# Patient Record
Sex: Female | Born: 1935 | Race: White | Hispanic: No | Marital: Married | Smoking: Former smoker
Health system: Southern US, Community
[De-identification: ages and names within clinical notes are randomized; demographics above are authoritative.]

## PROBLEM LIST (undated history)

## (undated) DIAGNOSIS — I4891 Unspecified atrial fibrillation: Secondary | ICD-10-CM

## (undated) DIAGNOSIS — Z8701 Personal history of pneumonia (recurrent): Secondary | ICD-10-CM

## (undated) DIAGNOSIS — C4491 Basal cell carcinoma of skin, unspecified: Secondary | ICD-10-CM

## (undated) DIAGNOSIS — Z8709 Personal history of other diseases of the respiratory system: Secondary | ICD-10-CM

## (undated) DIAGNOSIS — I491 Atrial premature depolarization: Secondary | ICD-10-CM

## (undated) DIAGNOSIS — I5032 Chronic diastolic (congestive) heart failure: Secondary | ICD-10-CM

## (undated) DIAGNOSIS — I10 Essential (primary) hypertension: Secondary | ICD-10-CM

## (undated) DIAGNOSIS — G629 Polyneuropathy, unspecified: Secondary | ICD-10-CM

## (undated) DIAGNOSIS — E119 Type 2 diabetes mellitus without complications: Secondary | ICD-10-CM

## (undated) DIAGNOSIS — G8929 Other chronic pain: Secondary | ICD-10-CM

## (undated) DIAGNOSIS — I350 Nonrheumatic aortic (valve) stenosis: Secondary | ICD-10-CM

## (undated) DIAGNOSIS — M549 Dorsalgia, unspecified: Secondary | ICD-10-CM

## (undated) DIAGNOSIS — R011 Cardiac murmur, unspecified: Secondary | ICD-10-CM

## (undated) DIAGNOSIS — K219 Gastro-esophageal reflux disease without esophagitis: Secondary | ICD-10-CM

## (undated) DIAGNOSIS — R0789 Other chest pain: Secondary | ICD-10-CM

## (undated) HISTORY — DX: Basal cell carcinoma of skin, unspecified: C44.91

## (undated) HISTORY — PX: KNEE SURGERY: SHX244

## (undated) HISTORY — DX: Nonrheumatic aortic (valve) stenosis: I35.0

## (undated) HISTORY — DX: Dorsalgia, unspecified: M54.9

## (undated) HISTORY — DX: Type 2 diabetes mellitus without complications: E11.9

## (undated) HISTORY — DX: Gastro-esophageal reflux disease without esophagitis: K21.9

## (undated) HISTORY — PX: EYE SURGERY: SHX253

## (undated) HISTORY — PX: CATARACT EXTRACTION: SUR2

## (undated) HISTORY — PX: CARDIAC CATHETERIZATION: SHX172

## (undated) HISTORY — DX: Polyneuropathy, unspecified: G62.9

## (undated) HISTORY — DX: Cardiac murmur, unspecified: R01.1

## (undated) HISTORY — DX: Atrial premature depolarization: I49.1

## (undated) HISTORY — DX: Personal history of pneumonia (recurrent): Z87.01

## (undated) HISTORY — DX: Personal history of other diseases of the respiratory system: Z87.09

## (undated) HISTORY — PX: OTHER SURGICAL HISTORY: SHX169

## (undated) HISTORY — DX: Other chronic pain: G89.29

---

## 1962-01-13 HISTORY — PX: VESICOVAGINAL FISTULA CLOSURE W/ TAH: SUR271

## 1962-01-13 HISTORY — PX: ABDOMINAL HYSTERECTOMY: SHX81

## 1997-03-15 ENCOUNTER — Ambulatory Visit (HOSPITAL_COMMUNITY): Admission: RE | Admit: 1997-03-15 | Discharge: 1997-03-15 | Payer: Self-pay | Admitting: Family Medicine

## 1997-06-16 ENCOUNTER — Ambulatory Visit (HOSPITAL_COMMUNITY): Admission: RE | Admit: 1997-06-16 | Discharge: 1997-06-16 | Payer: Self-pay | Admitting: Family Medicine

## 1997-08-02 ENCOUNTER — Ambulatory Visit (HOSPITAL_COMMUNITY): Admission: RE | Admit: 1997-08-02 | Discharge: 1997-08-02 | Payer: Self-pay | Admitting: Family Medicine

## 1998-01-13 HISTORY — PX: JOINT REPLACEMENT: SHX530

## 1998-03-29 ENCOUNTER — Ambulatory Visit (HOSPITAL_COMMUNITY): Admission: RE | Admit: 1998-03-29 | Discharge: 1998-03-29 | Payer: Self-pay | Admitting: Family Medicine

## 1998-04-04 ENCOUNTER — Encounter: Payer: Self-pay | Admitting: Family Medicine

## 1998-04-04 ENCOUNTER — Ambulatory Visit (HOSPITAL_COMMUNITY): Admission: RE | Admit: 1998-04-04 | Discharge: 1998-04-04 | Payer: Self-pay | Admitting: Family Medicine

## 1998-07-25 ENCOUNTER — Encounter: Payer: Self-pay | Admitting: Specialist

## 1998-07-31 ENCOUNTER — Inpatient Hospital Stay (HOSPITAL_COMMUNITY): Admission: RE | Admit: 1998-07-31 | Discharge: 1998-08-06 | Payer: Self-pay | Admitting: Specialist

## 1998-07-31 ENCOUNTER — Encounter: Payer: Self-pay | Admitting: Specialist

## 1998-08-02 ENCOUNTER — Encounter: Payer: Self-pay | Admitting: Specialist

## 1998-08-23 ENCOUNTER — Encounter: Admission: RE | Admit: 1998-08-23 | Discharge: 1998-11-21 | Payer: Self-pay | Admitting: Specialist

## 1999-04-10 ENCOUNTER — Ambulatory Visit (HOSPITAL_COMMUNITY): Admission: RE | Admit: 1999-04-10 | Discharge: 1999-04-10 | Payer: Self-pay | Admitting: Family Medicine

## 1999-04-10 ENCOUNTER — Encounter: Payer: Self-pay | Admitting: Family Medicine

## 2000-02-19 ENCOUNTER — Other Ambulatory Visit: Admission: RE | Admit: 2000-02-19 | Discharge: 2000-02-19 | Payer: Self-pay | Admitting: Family Medicine

## 2000-04-15 ENCOUNTER — Encounter: Payer: Self-pay | Admitting: Family Medicine

## 2000-04-15 ENCOUNTER — Ambulatory Visit (HOSPITAL_COMMUNITY): Admission: RE | Admit: 2000-04-15 | Discharge: 2000-04-15 | Payer: Self-pay | Admitting: Family Medicine

## 2000-10-08 ENCOUNTER — Encounter: Payer: Self-pay | Admitting: Family Medicine

## 2000-10-08 ENCOUNTER — Encounter: Admission: RE | Admit: 2000-10-08 | Discharge: 2000-10-08 | Payer: Self-pay | Admitting: Family Medicine

## 2001-04-21 ENCOUNTER — Encounter: Payer: Self-pay | Admitting: Family Medicine

## 2001-04-21 ENCOUNTER — Ambulatory Visit (HOSPITAL_COMMUNITY): Admission: RE | Admit: 2001-04-21 | Discharge: 2001-04-21 | Payer: Self-pay | Admitting: Family Medicine

## 2002-04-25 ENCOUNTER — Ambulatory Visit (HOSPITAL_COMMUNITY): Admission: RE | Admit: 2002-04-25 | Discharge: 2002-04-25 | Payer: Self-pay | Admitting: Family Medicine

## 2002-04-25 ENCOUNTER — Encounter: Payer: Self-pay | Admitting: Family Medicine

## 2002-09-21 ENCOUNTER — Ambulatory Visit (HOSPITAL_COMMUNITY): Admission: RE | Admit: 2002-09-21 | Discharge: 2002-09-21 | Payer: Self-pay | Admitting: Gastroenterology

## 2002-11-25 ENCOUNTER — Encounter: Admission: RE | Admit: 2002-11-25 | Discharge: 2002-12-02 | Payer: Self-pay | Admitting: Family Medicine

## 2003-05-11 ENCOUNTER — Ambulatory Visit (HOSPITAL_COMMUNITY): Admission: RE | Admit: 2003-05-11 | Discharge: 2003-05-11 | Payer: Self-pay | Admitting: Family Medicine

## 2004-05-23 ENCOUNTER — Ambulatory Visit (HOSPITAL_COMMUNITY): Admission: RE | Admit: 2004-05-23 | Discharge: 2004-05-23 | Payer: Self-pay | Admitting: Family Medicine

## 2005-05-13 HISTORY — PX: CARDIOVASCULAR STRESS TEST: SHX262

## 2005-05-28 ENCOUNTER — Ambulatory Visit (HOSPITAL_COMMUNITY): Admission: RE | Admit: 2005-05-28 | Discharge: 2005-05-28 | Payer: Self-pay | Admitting: Family Medicine

## 2006-05-28 ENCOUNTER — Ambulatory Visit: Payer: Self-pay | Admitting: Family Medicine

## 2006-06-03 ENCOUNTER — Ambulatory Visit (HOSPITAL_COMMUNITY): Admission: RE | Admit: 2006-06-03 | Discharge: 2006-06-03 | Payer: Self-pay | Admitting: Family Medicine

## 2007-05-18 ENCOUNTER — Ambulatory Visit (HOSPITAL_COMMUNITY): Admission: RE | Admit: 2007-05-18 | Discharge: 2007-05-18 | Payer: Self-pay | Admitting: Gastroenterology

## 2007-06-09 ENCOUNTER — Encounter: Admission: RE | Admit: 2007-06-09 | Discharge: 2007-06-09 | Payer: Self-pay | Admitting: Family Medicine

## 2008-06-15 ENCOUNTER — Ambulatory Visit (HOSPITAL_COMMUNITY): Admission: RE | Admit: 2008-06-15 | Discharge: 2008-06-15 | Payer: Self-pay | Admitting: Family Medicine

## 2009-04-26 HISTORY — PX: US ECHOCARDIOGRAPHY: HXRAD669

## 2009-06-18 ENCOUNTER — Ambulatory Visit (HOSPITAL_COMMUNITY): Admission: RE | Admit: 2009-06-18 | Discharge: 2009-06-18 | Payer: Self-pay | Admitting: Family Medicine

## 2009-10-23 ENCOUNTER — Ambulatory Visit: Payer: Self-pay | Admitting: Cardiology

## 2010-02-03 ENCOUNTER — Encounter: Payer: Self-pay | Admitting: Family Medicine

## 2010-05-31 NOTE — Op Note (Signed)
   NAME:  [name] [name], [name] [name]                          ACCOUNT NO.:  [id number]   MEDICAL RECORD NO.:  [id number]                   PATIENT TYPE:  AMB   LOCATION:  ENDO                                 FACILITY:  [facility]   PHYSICIAN:  Petra Kuba, M.D.                 DATE OF BIRTH:  [birth year]   DATE OF PROCEDURE:  09/21/2002  DATE OF DISCHARGE:                                 OPERATIVE REPORT   PROCEDURE:  Colonoscopy.   INDICATIONS FOR PROCEDURE:  Change in bowel habits in patient well overdue  for colonic screening.   Consent was signed after risks, benefits, methods, and options were  thoroughly discussed in the office.   MEDICINES USED:  Fentanyl 87.5, Versed 7 mg.   DESCRIPTION OF PROCEDURE:  Rectal inspection was pertinent for external  hemorrhoids, small. Digital exam was negative. The pediatric  adjustable  colonoscope was inserted and advanced around the colon to the cecum. This  did require rolling her on her back and various abdominal pressures. On  insertion, some left sided diverticula were seen but no other abnormalities.  The cecum was identified by the appendiceal orifice and the ileocecal valve.  The scope was slowly withdrawn. The prep was fairly adequate, did require  lots of washing and suctioning for adequate visualization but on slow  withdrawal through the colon other than the left sided scattered  diverticula, no polyps, tumors, masses or signs of colitis were seen as we  slowly withdrew back to the rectum. Once back in the rectum, anorectal pull-  through and retroflexion confirmed the hemorrhoids. The scope was reinserted  a short ways up the left side of the colon, air was suctioned, scope  removed. The patient tolerated the procedure well. There was no obvious or  immediate complications.   ENDOSCOPIC DIAGNOSIS:  1. Internal and external hemorrhoids.  2. Left sided diverticula scattered.  3. Otherwise within normal limits to the cecum.   PLAN:   Consider repeat screening in five years if doing well medically.  Happy to see back p.r.n. or in two months to recheck symptoms and decide any  other workup and plans. Possibly just increasing fiber in her diet, possibly  the reassurance and a good clean out will allow her to return to her  previous normal bowel habits.                                               Petra Kuba, M.D.    MEM/MEDQ  D:  09/21/2002  T:  09/21/2002  Job:  [id number]   cc:   Pam Drown, M.D.  [address]  [place]  [place] [address]  Fax: [phone]

## 2010-07-12 ENCOUNTER — Other Ambulatory Visit (HOSPITAL_COMMUNITY): Payer: Self-pay | Admitting: Family Medicine

## 2010-07-12 DIAGNOSIS — Z1231 Encounter for screening mammogram for malignant neoplasm of breast: Secondary | ICD-10-CM

## 2010-07-26 ENCOUNTER — Ambulatory Visit (HOSPITAL_COMMUNITY): Payer: Self-pay

## 2010-07-29 ENCOUNTER — Telehealth: Payer: Self-pay | Admitting: Cardiology

## 2010-07-29 DIAGNOSIS — I350 Nonrheumatic aortic (valve) stenosis: Secondary | ICD-10-CM

## 2010-07-29 NOTE — Telephone Encounter (Signed)
Just wanted to remind you of the ECHO she is supposed to have done before scheduling to see Dr. Patty Sermons in October. Outside of calling to let her know what time she would be scheduled to have it done there is no need to call back. I have pulled her chart.

## 2010-08-01 ENCOUNTER — Ambulatory Visit (HOSPITAL_COMMUNITY)
Admission: RE | Admit: 2010-08-01 | Discharge: 2010-08-01 | Disposition: A | Discharge status: Home / Self Care | Payer: Medicare Other | Source: Ambulatory Visit | Attending: Family Medicine | Admitting: Family Medicine

## 2010-08-01 DIAGNOSIS — Z1231 Encounter for screening mammogram for malignant neoplasm of breast: Secondary | ICD-10-CM | POA: Insufficient documentation

## 2010-08-06 NOTE — Telephone Encounter (Signed)
Scheduling echo and follow up with  Dr. Patty Sermons

## 2010-08-22 ENCOUNTER — Ambulatory Visit
Admission: RE | Admit: 2010-08-22 | Discharge: 2010-08-22 | Disposition: A | Discharge status: Home / Self Care | Payer: No Typology Code available for payment source | Source: Ambulatory Visit | Attending: Family Medicine | Admitting: Family Medicine

## 2010-08-22 ENCOUNTER — Other Ambulatory Visit: Payer: Self-pay | Admitting: Family Medicine

## 2010-08-22 DIAGNOSIS — R1031 Right lower quadrant pain: Secondary | ICD-10-CM

## 2010-08-22 MED ORDER — IOHEXOL 300 MG/ML  SOLN: 125.0000 mL | Freq: Once | INTRAMUSCULAR | Status: AC | PRN

## 2010-10-14 ENCOUNTER — Other Ambulatory Visit (HOSPITAL_COMMUNITY): Payer: No Typology Code available for payment source | Admitting: Radiology

## 2010-10-18 ENCOUNTER — Ambulatory Visit (HOSPITAL_COMMUNITY): Payer: Medicare Other | Attending: Cardiology | Admitting: Radiology

## 2010-10-18 ENCOUNTER — Other Ambulatory Visit (HOSPITAL_COMMUNITY): Payer: No Typology Code available for payment source

## 2010-10-18 DIAGNOSIS — I2510 Atherosclerotic heart disease of native coronary artery without angina pectoris: Secondary | ICD-10-CM | POA: Insufficient documentation

## 2010-10-18 DIAGNOSIS — I359 Nonrheumatic aortic valve disorder, unspecified: Secondary | ICD-10-CM | POA: Insufficient documentation

## 2010-10-18 DIAGNOSIS — I350 Nonrheumatic aortic (valve) stenosis: Secondary | ICD-10-CM

## 2010-10-23 ENCOUNTER — Ambulatory Visit: Payer: No Typology Code available for payment source | Admitting: Cardiology

## 2010-10-24 ENCOUNTER — Encounter: Payer: Self-pay | Admitting: Cardiology

## 2010-11-12 ENCOUNTER — Ambulatory Visit (INDEPENDENT_AMBULATORY_CARE_PROVIDER_SITE_OTHER): Payer: Medicare Other | Admitting: Cardiology

## 2010-11-12 ENCOUNTER — Encounter: Payer: Self-pay | Admitting: Cardiology

## 2010-11-12 VITALS — BP 118/62 | HR 80 | Ht 65.0 in | Wt 207.0 lb

## 2010-11-12 DIAGNOSIS — I359 Nonrheumatic aortic valve disorder, unspecified: Secondary | ICD-10-CM

## 2010-11-12 DIAGNOSIS — I350 Nonrheumatic aortic (valve) stenosis: Secondary | ICD-10-CM | POA: Insufficient documentation

## 2010-11-12 DIAGNOSIS — E119 Type 2 diabetes mellitus without complications: Secondary | ICD-10-CM

## 2010-11-12 DIAGNOSIS — I119 Hypertensive heart disease without heart failure: Secondary | ICD-10-CM

## 2010-11-12 DIAGNOSIS — K264 Chronic or unspecified duodenal ulcer with hemorrhage: Secondary | ICD-10-CM

## 2010-11-12 DIAGNOSIS — E7800 Pure hypercholesterolemia, unspecified: Secondary | ICD-10-CM

## 2010-11-12 DIAGNOSIS — R5381 Other malaise: Secondary | ICD-10-CM

## 2010-11-12 NOTE — Assessment & Plan Note (Signed)
The patient had a history of recent duodenal bleed in August.  Her hemoglobin reached a low of 9.1.  She did not require transfusion.  She recently saw Dr. Ewing Schlein  , who placed her on over-the-counter iron once a day.  The patient has not been experiencing any significant evidence of recurrent hemorrhage.  We will now restart her baby aspirin just 3 days a week 81 mg Monday, Wednesday, and Friday, and she will take it with food

## 2010-11-12 NOTE — Progress Notes (Signed)
[name] [name] Date of Birth:  10-Oct-1930 [facility] [address]   [address] [place], [place]  [address] [phone]           Fax   [phone]  History of Present Illness: This 77 year old female is seen for a one-year followup office visit.  She has a history of mild aortic stenosis.  She also has a history of diabetes, hypertension, and hypercholesterolemia.  She has had exogenous obesity.  She has been successful and gradually losing weight.  Her last saw her.  She was hospitalized in August at [facility] with a bleeding ulcer.  It was a duodenal ulcer.  At the present time.  She is off her aspirin.  Current Outpatient Prescriptions  Medication Sig Dispense Refill  . aspirin 81 MG tablet Take 81 mg by mouth as directed. Take 1 on Monday, Wednesday, and Friday only       . Calcium Carbonate-Vitamin D (CALCIUM + D PO) Take by mouth daily.        Marland Kitchen CRANBERRY FRUIT PO Take by mouth.        . fentaNYL (DURAGESIC - DOSED MCG/HR) 50 MCG/HR Place 1 patch onto the skin every 3 (three) days.        . furosemide (LASIX) 20 MG tablet Take 20 mg by mouth as needed.        . gabapentin (NEURONTIN) 300 MG capsule Take 300 mg by mouth 3 (three) times daily.        Marland Kitchen HYDROcodone-acetaminophen (VICODIN) 5-500 MG per tablet Take 1 tablet by mouth every 6 (six) hours as needed.        . IRON PO Take by mouth.        . metFORMIN (GLUCOPHAGE) 500 MG tablet Take 500 mg by mouth daily with breakfast. 3 daily      . metoprolol (TOPROL-XL) 50 MG 24 hr tablet Take 50 mg by mouth daily. Taking 1/2 Daily       . Multiple Vitamin (MULTIVITAMIN PO) Take by mouth daily.        . pantoprazole (PROTONIX) 40 MG tablet Take 40 mg by mouth 2 (two) times daily.        . Rosuvastatin Calcium (CRESTOR PO) Take 5 mg by mouth.          Allergies  Allergen Reactions  . Codeine   . Demerol   . Statins   . Zetia (Ezetimibe)     There is no problem list on file for this  patient.   History  Smoking status  . Former Smoker  . Quit date: 01/14/1980  Smokeless tobacco  . Not on file    History  Alcohol Use No    No family history on file.  Review of Systems: Constitutional: no fever chills diaphoresis or fatigue or change in weight.  Head and neck: no hearing loss, no epistaxis, no photophobia or visual disturbance. Respiratory: No cough, shortness of breath or wheezing. Cardiovascular: No chest pain peripheral edema, palpitations. Gastrointestinal: No abdominal distention, no abdominal pain, no change in bowel habits hematochezia or melena. Genitourinary: No dysuria, no frequency, no urgency, no nocturia. Musculoskeletal:No arthralgias, no back pain, no gait disturbance or myalgias. Neurological: No dizziness, no headaches, no numbness, no seizures, no syncope, no weakness, no tremors. Hematologic: No lymphadenopathy, no easy bruising. Psychiatric: No confusion, no hallucinations, no sleep disturbance.    Physical Exam: Filed Vitals:   11/12/10 1143  BP: 118/62  Pulse: 80  Gen. appearance reveals a well-developed, well-nourished woman in no distress.  Her pulse when I checked her after she got up on the examination table was 100.Pupils equal and reactive.   Extraocular Movements are full.  There is no scleral icterus.  The mouth and pharynx are normal.  The neck is supple.  The carotids reveal no bruits.  The jugular venous pressure is normal.  The thyroid is not enlarged.  There is no lymphadenopathy.  The chest is clear to percussion and auscultation. There are no rales or rhonchi. Expansion of the chest is symmetrical.  Heart reveals a soft systolic ejection murmur at the baseThe abdomen is soft and nontender. Bowel sounds are normal. The liver and spleen are not enlarged. There Are no abdominal masses. There are no bruits.  The pedal pulses are good.  There is no phlebitis or edema.  There is no cyanosis or clubbing. Strength is normal  and symmetrical in all extremities.  There is no lateralizing weakness.  There are no sensory deficits.  The skin is warm and dry.  There is no rash.  EKG shows sinus tachycardia at 100 per minute, and no ischemic changes  Assessment / Plan: Continue same medications.  Add baby aspirin Monday, Wednesday, and Friday.  Continue proton pump inhibitor.  Recheck here in one year for followup office visit and EKG.  After that we will decide about timing of her next echo.

## 2010-11-12 NOTE — Assessment & Plan Note (Signed)
Patient has a history of a systolic ejection murmur.  Her recent echocardiogram showed very mild aortic stenosis with a mean systolic gradient of 11 and a peak gradient of 22.  She also had mild LVH with normal systolic function.  There was no comment on diastolic function.  Clinically, the patient has been more fatigued and has been more short of breath related to her recent anemia, secondary to bleeding, duodenal ulcer

## 2010-11-12 NOTE — Assessment & Plan Note (Signed)
The patient has a history of essential hypertension.  She has not been experiencing any dizziness or syncope.  She has had some exertional dyspnea and fatigue related to her anemia.

## 2010-11-12 NOTE — Patient Instructions (Signed)
Restart Aspirin 81 mg Monday, Wednesday, and Friday only  Your physician wants you to follow-up in: 1 year You will receive a reminder letter in the mail two months in advance. If you don't receive a letter, please call our office to schedule the follow-up appointment.

## 2010-11-12 NOTE — Assessment & Plan Note (Signed)
The patient has a past history of cholesterolemia, followed by Dr. Uvaldo Rising

## 2011-06-16 ENCOUNTER — Other Ambulatory Visit (HOSPITAL_COMMUNITY): Payer: Self-pay | Admitting: Family Medicine

## 2011-06-16 DIAGNOSIS — Z1231 Encounter for screening mammogram for malignant neoplasm of breast: Secondary | ICD-10-CM

## 2011-08-04 ENCOUNTER — Ambulatory Visit (HOSPITAL_COMMUNITY)
Admission: RE | Admit: 2011-08-04 | Discharge: 2011-08-04 | Disposition: A | Discharge status: Home / Self Care | Payer: Medicare Other | Source: Ambulatory Visit | Attending: Family Medicine | Admitting: Family Medicine

## 2011-08-04 DIAGNOSIS — Z1231 Encounter for screening mammogram for malignant neoplasm of breast: Secondary | ICD-10-CM

## 2011-10-02 ENCOUNTER — Ambulatory Visit
Admission: RE | Admit: 2011-10-02 | Discharge: 2011-10-02 | Disposition: A | Discharge status: Home / Self Care | Payer: Medicare Other | Source: Ambulatory Visit | Attending: Physician Assistant | Admitting: Physician Assistant

## 2011-10-02 ENCOUNTER — Other Ambulatory Visit: Payer: Self-pay | Admitting: Physician Assistant

## 2011-10-02 DIAGNOSIS — R52 Pain, unspecified: Secondary | ICD-10-CM

## 2011-12-18 ENCOUNTER — Encounter (INDEPENDENT_AMBULATORY_CARE_PROVIDER_SITE_OTHER): Payer: Self-pay | Admitting: General Surgery

## 2011-12-19 ENCOUNTER — Ambulatory Visit (INDEPENDENT_AMBULATORY_CARE_PROVIDER_SITE_OTHER): Payer: Medicare Other | Admitting: General Surgery

## 2011-12-19 ENCOUNTER — Encounter (INDEPENDENT_AMBULATORY_CARE_PROVIDER_SITE_OTHER): Payer: Self-pay | Admitting: General Surgery

## 2011-12-19 VITALS — BP 122/79 | HR 83 | Temp 97.2°F | Resp 16 | Ht 63.0 in | Wt 201.8 lb

## 2011-12-19 DIAGNOSIS — D179 Benign lipomatous neoplasm, unspecified: Secondary | ICD-10-CM

## 2011-12-19 NOTE — Progress Notes (Signed)
Patient ID: [name] [name], female   DOB: 11/16/1930, 78 y.o.   MRN: [id number]  Chief Complaint  Patient presents with  . Lipoma    HPI [name] [name] is a 78 y.o. female.  For by Dr. McNeill for evaluation of a right back mass. He states that for several months. She states that this pain it has been getting bigger over this period of time. HPI  Past Medical History  Diagnosis Date  . Exertional dyspnea   . Heart murmur     She has a known heart murmur of aortic valve stenosis  . Chronic back pain     She does have a hx of   . Aortic stenosis     She has a past hx of known  . Diabetes mellitus     adult onset   . Peripheral neuropathy   . GERD (gastroesophageal reflux disease)     Past Surgical History  Procedure Date  . Vesicovaginal fistula closure w/ tah 1964  . Us echocardiography 04-26-09    EF 55-60%  . Cardiovascular stress test 05-13-2005    EF 67%  . Knee surgery     History reviewed. No pertinent family history.  Social History History  Substance Use Topics  . Smoking status: Former Smoker    Quit date: 01/14/1980  . Smokeless tobacco: Not on file  . Alcohol Use: No    Allergies  Allergen Reactions  . Codeine   . Demerol   . Statins   . Zetia (Ezetimibe)     Current Outpatient Prescriptions  Medication Sig Dispense Refill  . aspirin 81 MG tablet Take 81 mg by mouth as directed. Take 1 on Monday, Wednesday, and Friday only       . Biotin (BIOTIN 5000) 5 MG CAPS Take by mouth.      . Blood Glucose Monitoring Suppl (ONE TOUCH ULTRA SYSTEM KIT) W/DEVICE KIT 1 kit by Does not apply route once.      . Calcium Carbonate-Vitamin D (CALCIUM + D PO) Take by mouth daily.        . CRANBERRY FRUIT PO Take by mouth.        . fentaNYL (DURAGESIC - DOSED MCG/HR) 50 MCG/HR Place 1 patch onto the skin every 3 (three) days.        . furosemide (LASIX) 20 MG tablet Take 20 mg by mouth as needed.        . gabapentin (NEURONTIN) 300 MG capsule Take 300 mg by mouth 3  (three) times daily.        . HYDROcodone-acetaminophen (VICODIN) 5-500 MG per tablet Take 1 tablet by mouth every 6 (six) hours as needed.        . IRON PO Take by mouth.        . Lancets MISC by Does not apply route.      . lisinopril-hydrochlorothiazide (PRINZIDE,ZESTORETIC) 20-25 MG per tablet Take 1 tablet by mouth daily.      . metFORMIN (GLUCOPHAGE) 500 MG tablet Take 500 mg by mouth daily with breakfast. 3 daily      . metoprolol (TOPROL-XL) 50 MG 24 hr tablet Take 50 mg by mouth daily. Taking 1/2 Daily       . Multiple Vitamin (MULTIVITAMIN PO) Take by mouth daily.        . pantoprazole (PROTONIX) 40 MG tablet Take 40 mg by mouth 2 (two) times daily.        . Rosuvastatin Calcium (CRESTOR PO) Take 5 mg   by mouth.          Review of Systems Review of Systems  Blood pressure 122/79, pulse 83, temperature 97.2 F (36.2 C), temperature source Temporal, resp. rate 16, height 5' 3" (1.6 m), weight 201 lb 12.8 oz (91.536 kg).  Physical Exam Physical Exam  Data Reviewed none  Assessment    78-year-old female with right back mass.    Plan    1. We'll proceed to the operating room for excision of a right back mass. 2. All risks and benefits were discussed with the patient, to generally include infection, bleeding, damage to surrounding structures, and recurrence. Alternatives were offered and described.  All questions were answered and the patient voiced understanding of the procedure and wishes to proceed at this point.        Shaka Zech Jr., [name] [name] 12/19/2011, 1:57 PM    

## 2011-12-19 NOTE — Addendum Note (Signed)
Addended by: Axel Filler on: 12/19/2011 02:20 PM   Modules accepted: Orders

## 2012-01-05 ENCOUNTER — Encounter (HOSPITAL_COMMUNITY): Payer: Self-pay

## 2012-01-08 ENCOUNTER — Encounter (HOSPITAL_COMMUNITY): Payer: Self-pay

## 2012-01-08 ENCOUNTER — Ambulatory Visit (HOSPITAL_COMMUNITY)
Admission: RE | Admit: 2012-01-08 | Discharge: 2012-01-08 | Disposition: A | Discharge status: Home / Self Care | Payer: Medicare Other | Source: Ambulatory Visit | Attending: General Surgery | Admitting: General Surgery

## 2012-01-08 ENCOUNTER — Encounter (HOSPITAL_COMMUNITY)
Admission: RE | Admit: 2012-01-08 | Discharge: 2012-01-08 | Disposition: A | Discharge status: Home / Self Care | Payer: Medicare Other | Source: Ambulatory Visit | Attending: General Surgery | Admitting: General Surgery

## 2012-01-08 DIAGNOSIS — Z01818 Encounter for other preprocedural examination: Secondary | ICD-10-CM | POA: Insufficient documentation

## 2012-01-08 LAB — BASIC METABOLIC PANEL
BUN: 19 mg/dL (ref 6–23)
Chloride: 97 mEq/L (ref 96–112)
GFR calc Af Amer: 71 mL/min — ABNORMAL LOW (ref >90)
Potassium: 4.5 mEq/L (ref 3.5–5.1)

## 2012-01-08 LAB — CBC
HCT: 37.7 % (ref 36.0–46.0)
Platelets: 227 10*3/uL (ref 150–400)
RDW: 12.2 % (ref 11.5–15.5)
WBC: 9.4 10*3/uL (ref 4.0–10.5)

## 2012-01-08 LAB — SURGICAL PCR SCREEN: Staphylococcus aureus: NEGATIVE

## 2012-01-08 NOTE — Patient Instructions (Addendum)
20 [name] [name]  01/08/2012   Your procedure is scheduled on: 01/13/12  Report to Darrin Nipper at [phone].  Call this number if you have problems the morning of surgery 336-: [phone]   Remember:   Do not eat food or drink liquids After Midnight.     Take these medicines the morning of surgery with A SIP OF WATER: gabapentin, protonix, vicoden if needed   Do not wear jewelry, make-up or nail polish.  Do not wear lotions, powders, or perfumes. You may wear deodorant.  Do not shave 48 hours prior to surgery. Men may shave face and neck.  Do not bring valuables to the hospital.  Contacts, dentures or bridgework may not be worn into surgery.     Patients discharged the day of surgery will not be allowed to drive home.  Name and phone number of your driver: Bonnita Hollow [phone]    Please read over the following fact sheets that you were given: MRSA Information.  Birdie Sons, RN  pre op nurse call if needed [phone]    FAILURE TO FOLLOW THESE INSTRUCTIONS MAY RESULT IN CANCELLATION OF YOUR SURGERY   Patient Signature: ___________________________________________

## 2012-01-13 ENCOUNTER — Encounter (HOSPITAL_COMMUNITY): Payer: Self-pay | Admitting: Anesthesiology

## 2012-01-13 ENCOUNTER — Encounter (HOSPITAL_COMMUNITY)
Admission: RE | Disposition: A | Discharge status: Home / Self Care | Payer: Self-pay | Source: Ambulatory Visit | Attending: General Surgery

## 2012-01-13 ENCOUNTER — Encounter (HOSPITAL_COMMUNITY): Payer: Self-pay | Admitting: *Deleted

## 2012-01-13 ENCOUNTER — Ambulatory Visit (HOSPITAL_COMMUNITY)
Admission: RE | Admit: 2012-01-13 | Discharge: 2012-01-13 | Disposition: A | Discharge status: Home / Self Care | Payer: Medicare Other | Source: Ambulatory Visit | Attending: General Surgery | Admitting: General Surgery

## 2012-01-13 ENCOUNTER — Ambulatory Visit (HOSPITAL_COMMUNITY): Payer: Medicare Other | Admitting: Anesthesiology

## 2012-01-13 DIAGNOSIS — K219 Gastro-esophageal reflux disease without esophagitis: Secondary | ICD-10-CM | POA: Insufficient documentation

## 2012-01-13 DIAGNOSIS — Z79899 Other long term (current) drug therapy: Secondary | ICD-10-CM | POA: Insufficient documentation

## 2012-01-13 DIAGNOSIS — L723 Sebaceous cyst: Secondary | ICD-10-CM | POA: Insufficient documentation

## 2012-01-13 DIAGNOSIS — I359 Nonrheumatic aortic valve disorder, unspecified: Secondary | ICD-10-CM

## 2012-01-13 DIAGNOSIS — E119 Type 2 diabetes mellitus without complications: Secondary | ICD-10-CM | POA: Insufficient documentation

## 2012-01-13 DIAGNOSIS — Z7982 Long term (current) use of aspirin: Secondary | ICD-10-CM | POA: Insufficient documentation

## 2012-01-13 DIAGNOSIS — I350 Nonrheumatic aortic (valve) stenosis: Secondary | ICD-10-CM

## 2012-01-13 DIAGNOSIS — I499 Cardiac arrhythmia, unspecified: Secondary | ICD-10-CM

## 2012-01-13 HISTORY — PX: MASS EXCISION: SHX2000

## 2012-01-13 LAB — GLUCOSE, CAPILLARY
Glucose-Capillary: 138 mg/dL — ABNORMAL HIGH (ref 70–99)
Glucose-Capillary: 125 mg/dL — ABNORMAL HIGH (ref 70–99)

## 2012-01-13 SURGERY — EXCISION MASS
Anesthesia: General | Site: Back | Laterality: Right | Wound class: Clean

## 2012-01-13 MED ORDER — PROMETHAZINE HCL 25 MG/ML IJ SOLN: 6.2500 mg | INTRAMUSCULAR | Status: DC | PRN

## 2012-01-13 MED ORDER — 0.9 % SODIUM CHLORIDE (POUR BTL) OPTIME
TOPICAL | Status: DC | PRN
  Administered 2012-01-13: 1000 mL

## 2012-01-13 MED ORDER — ACETAMINOPHEN 10 MG/ML IV SOLN
INTRAVENOUS | Status: AC
  Filled 2012-01-13: qty 100

## 2012-01-13 MED ORDER — PROPOFOL 10 MG/ML IV BOLUS
INTRAVENOUS | Status: DC | PRN
  Administered 2012-01-13: 150 mg via INTRAVENOUS

## 2012-01-13 MED ORDER — CEFAZOLIN SODIUM-DEXTROSE 2-3 GM-% IV SOLR
2.0000 g | INTRAVENOUS | Status: AC
  Administered 2012-01-13: 2 g via INTRAVENOUS

## 2012-01-13 MED ORDER — BUPIVACAINE-EPINEPHRINE 0.25% -1:200000 IJ SOLN
INTRAMUSCULAR | Status: DC | PRN
  Administered 2012-01-13: 10 mL

## 2012-01-13 MED ORDER — BUPIVACAINE-EPINEPHRINE PF 0.25-1:200000 % IJ SOLN
INTRAMUSCULAR | Status: AC
  Filled 2012-01-13: qty 30

## 2012-01-13 MED ORDER — SUCCINYLCHOLINE CHLORIDE 20 MG/ML IJ SOLN
INTRAMUSCULAR | Status: DC | PRN
  Administered 2012-01-13: 100 mg via INTRAVENOUS

## 2012-01-13 MED ORDER — CEFAZOLIN SODIUM-DEXTROSE 2-3 GM-% IV SOLR
INTRAVENOUS | Status: AC
  Filled 2012-01-13: qty 50

## 2012-01-13 MED ORDER — METOPROLOL SUCCINATE ER 50 MG PO TB24
50.0000 mg | ORAL_TABLET | ORAL | Status: AC
  Administered 2012-01-13: 50 mg via ORAL
  Filled 2012-01-13: qty 1

## 2012-01-13 MED ORDER — FENTANYL CITRATE 0.05 MG/ML IJ SOLN: 25.0000 ug | INTRAMUSCULAR | Status: DC | PRN

## 2012-01-13 MED ORDER — LACTATED RINGERS IV SOLN: INTRAVENOUS | Status: DC

## 2012-01-13 MED ORDER — HYDROCODONE-ACETAMINOPHEN 10-500 MG PO TABS: 1.0000 | ORAL_TABLET | Freq: Four times a day (QID) | ORAL | Status: DC | PRN

## 2012-01-13 MED ORDER — LACTATED RINGERS IV SOLN
INTRAVENOUS | Status: DC
  Administered 2012-01-13: 13:00:00 via INTRAVENOUS

## 2012-01-13 MED ORDER — ACETAMINOPHEN 10 MG/ML IV SOLN
INTRAVENOUS | Status: DC | PRN
  Administered 2012-01-13: 1000 mg via INTRAVENOUS

## 2012-01-13 MED ORDER — ONDANSETRON HCL 4 MG/2ML IJ SOLN
INTRAMUSCULAR | Status: DC | PRN
  Administered 2012-01-13: 4 mg via INTRAVENOUS

## 2012-01-13 MED ORDER — PHENYLEPHRINE HCL 10 MG/ML IJ SOLN
INTRAMUSCULAR | Status: DC | PRN
  Administered 2012-01-13: 40 ug via INTRAVENOUS

## 2012-01-13 MED ORDER — GABAPENTIN 300 MG PO CAPS
900.0000 mg | ORAL_CAPSULE | Freq: Once | ORAL | Status: AC
  Administered 2012-01-13: 900 mg via ORAL
  Filled 2012-01-13: qty 3

## 2012-01-13 MED ORDER — FENTANYL CITRATE 0.05 MG/ML IJ SOLN
INTRAMUSCULAR | Status: DC | PRN
  Administered 2012-01-13: 100 ug via INTRAVENOUS

## 2012-01-13 MED ORDER — LACTATED RINGERS IV SOLN
INTRAVENOUS | Status: DC | PRN
  Administered 2012-01-13: 07:00:00 via INTRAVENOUS

## 2012-01-13 SURGICAL SUPPLY — 47 items
ADH SKN CLS APL DERMABOND .7 (GAUZE/BANDAGES/DRESSINGS) ×1
APL SKNCLS STERI-STRIP NONHPOA (GAUZE/BANDAGES/DRESSINGS)
BENZOIN TINCTURE PRP APPL 2/3 (GAUZE/BANDAGES/DRESSINGS) IMPLANT
BLADE HEX COATED 2.75 (ELECTRODE) ×2 IMPLANT
BLADE SURG 15 STRL LF DISP TIS (BLADE) IMPLANT
BLADE SURG 15 STRL SS (BLADE) ×2
BLADE SURG SZ10 CARB STEEL (BLADE) ×3 IMPLANT
CANISTER SUCTION 2500CC (MISCELLANEOUS) ×2 IMPLANT
CLOTH BEACON ORANGE TIMEOUT ST (SAFETY) ×2 IMPLANT
DECANTER SPIKE VIAL GLASS SM (MISCELLANEOUS) ×1 IMPLANT
DERMABOND ADVANCED (GAUZE/BANDAGES/DRESSINGS) ×1
DERMABOND ADVANCED .7 DNX12 (GAUZE/BANDAGES/DRESSINGS) IMPLANT
DRAPE LAPAROTOMY T 102X78X121 (DRAPES) IMPLANT
DRAPE LAPAROTOMY TRNSV 102X78 (DRAPE) ×1 IMPLANT
DRAPE LG THREE QUARTER DISP (DRAPES) IMPLANT
ELECT REM PT RETURN 9FT ADLT (ELECTROSURGICAL) ×2
ELECTRODE REM PT RTRN 9FT ADLT (ELECTROSURGICAL) ×1 IMPLANT
EVACUATOR SILICONE 100CC (DRAIN) IMPLANT
GLOVE BIOGEL PI IND STRL 7.0 (GLOVE) ×1 IMPLANT
GLOVE BIOGEL PI IND STRL 7.5 (GLOVE) IMPLANT
GLOVE BIOGEL PI INDICATOR 7.0 (GLOVE)
GLOVE BIOGEL PI INDICATOR 7.5 (GLOVE) ×2
GLOVE ECLIPSE 8.0 STRL XLNG CF (GLOVE) ×2 IMPLANT
GLOVE INDICATOR 8.0 STRL GRN (GLOVE) ×3 IMPLANT
GLOVE SURG SS PI 7.0 STRL IVOR (GLOVE) ×2 IMPLANT
GOWN STRL NON-REIN LRG LVL3 (GOWN DISPOSABLE) ×2 IMPLANT
GOWN STRL REIN XL XLG (GOWN DISPOSABLE) ×5 IMPLANT
KIT BASIN OR (CUSTOM PROCEDURE TRAY) ×2 IMPLANT
MARKER SKIN DUAL TIP RULER LAB (MISCELLANEOUS) IMPLANT
NDL HYPO 25X1 1.5 SAFETY (NEEDLE) ×1 IMPLANT
NEEDLE HYPO 25X1 1.5 SAFETY (NEEDLE) ×2 IMPLANT
NS IRRIG 1000ML POUR BTL (IV SOLUTION) ×2 IMPLANT
PACK BASIC VI WITH GOWN DISP (CUSTOM PROCEDURE TRAY) ×2 IMPLANT
PENCIL BUTTON HOLSTER BLD 10FT (ELECTRODE) ×2 IMPLANT
SOL PREP POV-IOD 16OZ 10% (MISCELLANEOUS) ×1 IMPLANT
SPONGE GAUZE 4X4 12PLY (GAUZE/BANDAGES/DRESSINGS) ×2 IMPLANT
SPONGE LAP 18X18 X RAY DECT (DISPOSABLE) ×1 IMPLANT
SPONGE LAP 4X18 X RAY DECT (DISPOSABLE) ×1 IMPLANT
STAPLER VISISTAT 35W (STAPLE) IMPLANT
SUT MNCRL AB 4-0 PS2 18 (SUTURE) IMPLANT
SUT VIC AB 3-0 SH 27 (SUTURE)
SUT VIC AB 3-0 SH 27XBRD (SUTURE) IMPLANT
SUT VICRYL 0 UR6 27IN ABS (SUTURE) IMPLANT
SYR CONTROL 10ML LL (SYRINGE) ×2 IMPLANT
TOWEL OR 17X26 10 PK STRL BLUE (TOWEL DISPOSABLE) ×2 IMPLANT
TOWEL OR NON WOVEN STRL DISP B (DISPOSABLE) ×1 IMPLANT
YANKAUER SUCT BULB TIP 10FT TU (MISCELLANEOUS) ×1 IMPLANT

## 2012-01-13 NOTE — Progress Notes (Signed)
Dr. Acey Lav made aware of patient's EKG readings- sometimes patient in sinus rhythmn; then sinus rhythmn with PACS-then atrial fibrillation- Orders given.

## 2012-01-13 NOTE — Progress Notes (Signed)
Dr. Derrell Lolling made aware of Dr. Patty Sermons  Seeing patient and that she will be going home today- received Toprol xl 50 in PACU

## 2012-01-13 NOTE — Progress Notes (Signed)
Dr. . Patty Sermons in to see patient.

## 2012-01-13 NOTE — Progress Notes (Signed)
12 Lead EKG done.

## 2012-01-13 NOTE — Interval H&P Note (Signed)
History and Physical Interval Note:  01/13/2012 7:18 AM  [name] [name]  has presented today for surgery, with the diagnosis of Right back mass  The various methods of treatment have been discussed with the patient and family. After consideration of risks, benefits and other options for treatment, the patient has consented to  Procedure(s) (LRB) with comments: EXCISION MASS (Right) - Excision of Right Back Mass as a surgical intervention .  The patient's history has been reviewed, patient examined, no change in status, stable for surgery.  I have reviewed the patient's chart and labs.  Questions were answered to the patient's satisfaction.     Marigene Ehlers., Jed Limerick

## 2012-01-13 NOTE — Progress Notes (Signed)
Patient waiting on Cardiology Consult.

## 2012-01-13 NOTE — Progress Notes (Signed)
Dr. Acey Lav  In- saw EKG copy- to talk with Dr. Derrell Lolling.

## 2012-01-13 NOTE — Op Note (Signed)
Pre Operative Diagnosis:  Right back mass  Post Operative Diagnosis: right back cyst  Procedure: excision of back cyst  Surgeon: Dr. Axel Filler  Assistant: none  Anesthesia: GETA  EBL: <5cc  Complications: none  Counts: reported as correct x 2  Findings:  Fluid filled cyst; capsule excised  Indications for procedure:  The patient is a 78 year old female with increasing right back mass. Patient was seen in clinic and decided to have this electively removed..  Details of the procedure:The patient was taken back to the operating room. The patient was placed in supine position with bilateral SCDs in place. After appropriate anitbiotics were confirmed, a time-out was confirmed and all facts were verified. 1/4% Marcaine was used to area of the mass.  This and then the area of the mass. Into this a large amount fluid was encountered and evacuated. At this time I proceeded to excise the capsule of the cyst. The cavity of the cyst was removed in its entirety. The area was irrigated out with sterile saline. 3 Vicryl using a deep dermal fashion reapproximating the skin. 4-0 Monocryl was used in subcuticular fashion to reapproximate the skin. The skin was then dressed with Dermabond. The patient was taken to the recovery room in stable condition.

## 2012-01-13 NOTE — Progress Notes (Signed)
After iv removed, pt advised to lie still few minutes and hold pressure, which was reinforced by her sister.  She stated "I don'tt care what  the nurse said"  And got up immediately and pulled (hard) to put on pants and shoes.  She then developed a large blue area on hand at site of removal  Ice applied

## 2012-01-13 NOTE — Anesthesia Postprocedure Evaluation (Deleted)
  Anesthesia Post-op Note  Patient: [name] [name]  Procedure(s) Performed: Procedure(s) (LRB): EXCISION MASS (Right)  Patient Location: PACU  Anesthesia Type: General  Level of Consciousness: awake and alert   Airway and Oxygen Therapy: Patient Spontanous Breathing  Post-op Pain: mild  Post-op Assessment: Post-op Vital signs reviewed, Patient's Cardiovascular Status Stable, Respiratory Function Stable, Patent Airway and No signs of Nausea or vomiting  Last Vitals:  Filed Vitals:   01/13/12 1145  BP: 146/90  Pulse: 81  Temp:   Resp: 19    Post-op Vital Signs: stable   Complications: No apparent anesthesia complications

## 2012-01-13 NOTE — Progress Notes (Signed)
Nicolasa Ducking, NP- Cardiology- in to see patient.

## 2012-01-13 NOTE — H&P (View-Only) (Signed)
Patient ID: [name] [name], female   DOB: 11/21/1930, 78 y.o.   MRN: [id number]  Chief Complaint  Patient presents with  . Lipoma    HPI Dionisia Pacholski is a 78 y.o. female.  For by Dr. Corliss Blacker for evaluation of a right back mass. He states that for several months. She states that this pain it has been getting bigger over this period of time. HPI  Past Medical History  Diagnosis Date  . Exertional dyspnea   . Heart murmur     She has a known heart murmur of aortic valve stenosis  . Chronic back pain     She does have a hx of   . Aortic stenosis     She has a past hx of known  . Diabetes mellitus     adult onset   . Peripheral neuropathy   . GERD (gastroesophageal reflux disease)     Past Surgical History  Procedure Date  . Vesicovaginal fistula closure w/ tah 1964  . US echocardiography 04-26-09    EF 55-60%  . Cardiovascular stress test 05-13-2005    EF 67%  . Knee surgery     History reviewed. No pertinent family history.  Social History History  Substance Use Topics  . Smoking status: Former Smoker    Quit date: 01/14/1980  . Smokeless tobacco: Not on file  . Alcohol Use: No    Allergies  Allergen Reactions  . Codeine   . Demerol   . Statins   . Zetia (Ezetimibe)     Current Outpatient Prescriptions  Medication Sig Dispense Refill  . aspirin 81 MG tablet Take 81 mg by mouth as directed. Take 1 on Monday, Wednesday, and Friday only       . Biotin (BIOTIN 5000) 5 MG CAPS Take by mouth.      . Blood Glucose Monitoring Suppl (ONE TOUCH ULTRA SYSTEM KIT) W/DEVICE KIT 1 kit by Does not apply route once.      . Calcium Carbonate-Vitamin D (CALCIUM + D PO) Take by mouth daily.        Marland Kitchen CRANBERRY FRUIT PO Take by mouth.        . fentaNYL (DURAGESIC - DOSED MCG/HR) 50 MCG/HR Place 1 patch onto the skin every 3 (three) days.        . furosemide (LASIX) 20 MG tablet Take 20 mg by mouth as needed.        . gabapentin (NEURONTIN) 300 MG capsule Take 300 mg by mouth 3  (three) times daily.        Marland Kitchen HYDROcodone-acetaminophen (VICODIN) 5-500 MG per tablet Take 1 tablet by mouth every 6 (six) hours as needed.        . IRON PO Take by mouth.        . Lancets MISC by Does not apply route.      Marland Kitchen lisinopril-hydrochlorothiazide (PRINZIDE,ZESTORETIC) 20-25 MG per tablet Take 1 tablet by mouth daily.      . metFORMIN (GLUCOPHAGE) 500 MG tablet Take 500 mg by mouth daily with breakfast. 3 daily      . metoprolol (TOPROL-XL) 50 MG 24 hr tablet Take 50 mg by mouth daily. Taking 1/2 Daily       . Multiple Vitamin (MULTIVITAMIN PO) Take by mouth daily.        . pantoprazole (PROTONIX) 40 MG tablet Take 40 mg by mouth 2 (two) times daily.        . Rosuvastatin Calcium (CRESTOR PO) Take 5 mg  by mouth.          Review of Systems Review of Systems  Blood pressure 122/79, pulse 83, temperature 97.2 F (36.2 C), temperature source Temporal, resp. rate 16, height 5\' 3"  (1.6 m), weight 201 lb 12.8 oz (91.536 kg).  Physical Exam Physical Exam  Data Reviewed none  Assessment    78 year old female with right back mass.    Plan    1. We'll proceed to the operating room for excision of a right back mass. 2. All risks and benefits were discussed with the patient, to generally include infection, bleeding, damage to surrounding structures, and recurrence. Alternatives were offered and described.  All questions were answered and the patient voiced understanding of the procedure and wishes to proceed at this point.        Marigene Ehlers., Dima Ferrufino 12/19/2011, 1:57 PM

## 2012-01-13 NOTE — Anesthesia Preprocedure Evaluation (Addendum)
Anesthesia Evaluation  Patient identified by MRN, date of birth, ID band Patient awake    Reviewed: Allergy & Precautions, H&P , NPO status , Patient's Chart, lab work & pertinent test results  Airway Mallampati: II TM Distance: >3 FB Neck ROM: Full    Dental No notable dental hx.    Pulmonary neg pulmonary ROS,  breath sounds clear to auscultation  Pulmonary exam normal       Cardiovascular hypertension, Pt. on medications negative cardio ROS  + Valvular Problems/Murmurs (mild AS) AS Rhythm:Regular Rate:Normal     Neuro/Psych negative neurological ROS  negative psych ROS   GI/Hepatic negative GI ROS, Neg liver ROS, PUD, GERD-  ,  Endo/Other  negative endocrine ROSdiabetes, Type 2  Renal/GU negative Renal ROS  negative genitourinary   Musculoskeletal negative musculoskeletal ROS (+)   Abdominal   Peds negative pediatric ROS (+)  Hematology negative hematology ROS (+)   Anesthesia Other Findings   Reproductive/Obstetrics negative OB ROS                         Anesthesia Physical Anesthesia Plan  ASA: III  Anesthesia Plan: General   Post-op Pain Management:    Induction: Intravenous  Airway Management Planned: Oral ETT  Additional Equipment:   Intra-op Plan:   Post-operative Plan: Extubation in OR  Informed Consent: I have reviewed the patients History and Physical, chart, labs and discussed the procedure including the risks, benefits and alternatives for the proposed anesthesia with the patient or authorized representative who has indicated his/her understanding and acceptance.   Dental advisory given  Plan Discussed with: CRNA  Anesthesia Plan Comments:         Anesthesia Quick Evaluation

## 2012-01-13 NOTE — Consult Note (Signed)
Patient ID: [name] [name] MRN: [id number], DOB/AGE: [birth year]   Admit date: 01/13/2012   Primary Physician: Gweneth Dimitri, MD Primary Cardiologist: Wylene Simmer, MD  Pt. Profile:  78 y/o female with h/o mild AS who presented for resection of right back mass and who was noted to have irregular heart rhythm post-op.  Problem List  Past Medical History  Diagnosis Date  . Heart murmur     She has a known heart murmur of aortic valve stenosis  . Chronic back pain     She does have a hx of   . Aortic stenosis     a. 10/2010 Echo: EF 60-65%, mild LVH, no rwma, mild AS.  . Diabetes mellitus     adult onset   . Peripheral neuropathy   . GERD (gastroesophageal reflux disease)   . Hypertension   . Pneumonia     hx of  . Bronchitis     hx of  . Cancer 2013    basal cell carcinoma    Past Surgical History  Procedure Date  . Vesicovaginal fistula closure w/ tah 1964  . US echocardiography 04-26-09    EF 55-60%  . Cardiovascular stress test 05-13-2005    EF 67%  . Knee surgery   . Joint replacement 2000    left knee  . Abdominal hysterectomy 1964  . Toe shortened     right 2nd toe  . Cataract extraction     both eyes    Allergies  Allergies  Allergen Reactions  . Codeine Nausea And Vomiting  . Demerol Other (See Comments)    Drunk feeling"  . Statins Other (See Comments)    "hurt"   . Zetia (Ezetimibe) Other (See Comments)    hurt   HPI  78 y/o female with the above problem list.  She is followed in our office for mild AS and HTN.  She recently noted discomfort associated with a mass on her right mid back.  She was referred to surgery and presented to [facility] today for surgical resection.  Post-operatively, she was noted to have an irregular heart rhythm with frequent PAC's and question of afib on the monitor.  She has been asymptomatic, denying chest discomfort, sob, palpitations, pnd, orthopnea, presyncope, or syncope.  Review of available tele strips and 12 lead shows  sinus rhythm with frequent PAC's.  Home Medications  Prior to Admission medications   Medication Sig Start Date End Date Taking? Authorizing Provider  aspirin 81 MG tablet Take 81 mg by mouth every Monday, Wednesday, and Friday. Take 1 on Monday, Wednesday, and Friday only   Yes Historical Provider, MD  Biotin (BIOTIN 5000) 5 MG CAPS Take 5,000 mg by mouth at bedtime.    Yes Historical Provider, MD  Calcium Carbonate-Vitamin D (CALCIUM + D PO) Take 1 tablet by mouth 2 (two) times daily.    Yes Historical Provider, MD  CRANBERRY FRUIT PO Take 1 tablet by mouth every morning.    Yes Historical Provider, MD  gabapentin (NEURONTIN) 300 MG capsule Take 900 mg by mouth 3 (three) times daily.    Yes Historical Provider, MD  HYDROcodone-acetaminophen (VICODIN) 5-500 MG per tablet Take 1 tablet by mouth every 6 (six) hours as needed.     Yes Historical Provider, MD  lisinopril-hydrochlorothiazide (PRINZIDE,ZESTORETIC) 20-25 MG per tablet Take 0.5 tablets by mouth daily.    Yes Historical Provider, MD  metFORMIN (GLUCOPHAGE) 1000 MG tablet Take 1,000 mg by mouth 2 (two) times daily with a meal.  Yes Historical Provider, MD  metoprolol (TOPROL-XL) 50 MG 24 hr tablet Take 50 mg by mouth at bedtime. Taking 1/2 Daily   Yes Historical Provider, MD  pantoprazole (PROTONIX) 40 MG tablet Take 40 mg by mouth every morning.    Yes Historical Provider, MD  Rosuvastatin Calcium (CRESTOR PO) Take 5 mg by mouth at bedtime.    Yes Historical Provider, MD  Blood Glucose Monitoring Suppl (ONE TOUCH ULTRA SYSTEM KIT) W/DEVICE KIT 1 kit by Does not apply route once.    Historical Provider, MD  fentaNYL (DURAGESIC - DOSED MCG/HR) 50 MCG/HR Place 1 patch onto the skin every 3 (three) days.      Historical Provider, MD  furosemide (LASIX) 20 MG tablet Take 20 mg by mouth as needed.      Historical Provider, MD  HYDROcodone-acetaminophen (LORTAB 10) 10-500 MG per tablet Take 1 tablet by mouth every 6 (six) hours as needed for  pain. 01/13/12   Axel Filler, MD  IRON PO Take by mouth.      Historical Provider, MD  Lancets MISC by Does not apply route.    Historical Provider, MD  Multiple Vitamin (MULTIVITAMIN PO) Take by mouth daily.      Historical Provider, MD   Family History  Family History  Problem Relation Age of Onset  . Other Mother     died @ 4, complications following childbirth  . COPD Father     died @ 6  . Heart failure Father   . Irregular heart beat Sister     1 sister with PPM.   Social History  History   Social History  . Marital Status: Married    Spouse Name: N/A    Number of Children: N/A  . Years of Education: N/A   Occupational History  . Not on file.   Social History Main Topics  . Smoking status: Former Smoker -- 0.5 packs/day for 5 years    Types: Cigarettes    Quit date: 01/14/1980  . Smokeless tobacco: Never Used  . Alcohol Use: No  . Drug Use: No  . Sexually Active:    Other Topics Concern  . Not on file   Social History Narrative   Lives with Husband in [place].  Has a dtr in [place], [place] and a son in [place], [place].    Review of Systems General:  No chills, fever, night sweats or weight changes.  Cardiovascular:  No chest pain, dyspnea on exertion, edema, orthopnea, palpitations, paroxysmal nocturnal dyspnea. Dermatological: No rash, lesions.  Right back mass with tenderness prior to admission. Respiratory: No cough, dyspnea Urologic: No hematuria, dysuria Abdominal:   No nausea, vomiting, diarrhea, bright red blood per rectum, melena, or hematemesis Neurologic:  No visual changes, wkns, changes in mental status. All other systems reviewed and are otherwise negative except as noted above.  Physical Exam  Blood pressure 137/73, pulse 67, temperature 98 F (36.7 C), temperature source Oral, resp. rate 23, SpO2 93.00%.  General: Pleasant, NAD Psych: Normal affect. Neuro: Alert and oriented X 3. Moves all extremities spontaneously. HEENT:  Normal  Neck: Supple without bruits or JVD. Lungs:  Resp regular and unlabored, CTA. Heart: Irreg no s3, s4.  3/6 sem loudest @ rusb/lusb - heard throughout.   Abdomen: Soft, non-tender, non-distended, BS + x 4.  Extremities: No clubbing, cyanosis or edema. DP/PT/Radials 2+ and equal bilaterally. Skin:  Surgical incision on right mid back w/o erythema.  Labs  Lab Results  Component Value Date  WBC 9.4 01/08/2012   HGB 12.1 01/08/2012   HCT 37.7 01/08/2012   MCV 98.7 01/08/2012   PLT 227 01/08/2012    Lab 01/08/12 1430  NA 139  K 4.5  CL 97  CO2 33*  BUN 19  CREATININE 0.88  CALCIUM 9.3  PROT --  BILITOT --  ALKPHOS --  ALT --  AST --  GLUCOSE 102*   Radiology/Studies  Dg Chest 2 View  01/08/2012  *RADIOLOGY REPORT*  Clinical Data: Preop  CHEST - 2 VIEW  Comparison: 03/17/2011  Findings: Normal heart size.  Clear lungs.  No pneumothorax or pleural effusion.  Degenerative changes in the thoracic spine.  IMPRESSION: No active cardiopulmonary disease.   Original Report Authenticated By: Jolaine Click, M.D.    ECG  Sinus rhythm, 99, frequent PAC's.  ASSESSMENT AND PLAN  1.  Sinus Rhythm with frequent PAC's:  No evidence of afib noted on 12 lead or monitor.  She is currently in sinus rhythm with frequent, asymptomatic, PAC's.  She had PAC's on pre-op ECG also (12/26).  Electrolytes at that time were wnl.  Cont home dose of Toprol XL.  2.  HTN:  Stable.  Cont home meds.  3.  Mild AS:  Asymptomatic.  Last echo 10/2010.  F/U echo in another 2 yrs.  4.  DM:  Cont home regimen following d/c.  5.  HL:  On statin therapy.  Signed, Nicolasa Ducking, NP 01/13/2012, 2:44 PM Agree with assessment and plan as noted above.  Patient is doing well clinically here in PACU.  No chest pain or dyspnea.Telemetry shows NSR with frequent and consecutive PACs, but no documented atrial fib. PACs are old.  Will give today's dose of toprol 50 mg now.  Ok for discharge home later today. She  already has an appointment to see me in early January.

## 2012-01-13 NOTE — Transfer of Care (Signed)
Immediate Anesthesia Transfer of Care Note  Patient: [name] [name]  Procedure(s) Performed: Procedure(s) (LRB): EXCISION MASS (Right)  Patient Location: PACU  Anesthesia Type: General  Level of Consciousness: sedated, patient cooperative and responds to stimulaton  Airway & Oxygen Therapy: Patient Spontanous Breathing and Patient connected to face mask oxgen  Post-op Assessment: Report given to PACU RN and Post -op Vital signs reviewed and stable  Post vital signs: Reviewed and stable  Complications: No apparent anesthesia complications

## 2012-01-13 NOTE — Anesthesia Postprocedure Evaluation (Signed)
  Anesthesia Post-op Note  Patient: [name] [name]  Procedure(s) Performed: Procedure(s) (LRB): EXCISION MASS (Right)  Patient Location: PACU  Anesthesia Type: General  Level of Consciousness: awake and alert   Airway and Oxygen Therapy: Patient Spontanous Breathing  Post-op Pain: mild  Post-op Assessment: Post-op Vital signs reviewed, Patient's Cardiovascular Status Stable, Respiratory Function Stable, Patent Airway and No signs of Nausea or vomiting  Last Vitals:  Filed Vitals:   01/13/12 0945  BP: 136/61  Pulse: 75  Temp:   Resp: 16    Post-op Vital Signs: stable   Complications: Pt appears to be flipping ina and out of a-fib. No previous history. Spoke to Dr. Derrell Lolling and [facility] to see patient in PACU. Vitals stable. No chest pain or symptoms.

## 2012-01-15 ENCOUNTER — Encounter (HOSPITAL_COMMUNITY): Payer: Self-pay | Admitting: General Surgery

## 2012-01-16 ENCOUNTER — Telehealth (INDEPENDENT_AMBULATORY_CARE_PROVIDER_SITE_OTHER): Payer: Self-pay | Admitting: General Surgery

## 2012-01-16 NOTE — Telephone Encounter (Signed)
Stu, pharmacist at [facility], called to report that Dr. Derrell Lolling ordered Hydrocodone 10/500 mg for his pt.  The only hydrocodone available any more is with 325 mg of acetaminophen.  Paged and updated Dr. Derrell Lolling, who OKd using the 10/325 mg dose.

## 2012-01-21 ENCOUNTER — Encounter: Payer: Self-pay | Admitting: Cardiology

## 2012-01-21 ENCOUNTER — Ambulatory Visit (INDEPENDENT_AMBULATORY_CARE_PROVIDER_SITE_OTHER): Payer: Medicare Other | Admitting: Cardiology

## 2012-01-21 VITALS — BP 120/70 | HR 100 | Resp 18 | Ht 62.0 in | Wt 202.0 lb

## 2012-01-21 DIAGNOSIS — I359 Nonrheumatic aortic valve disorder, unspecified: Secondary | ICD-10-CM

## 2012-01-21 DIAGNOSIS — I119 Hypertensive heart disease without heart failure: Secondary | ICD-10-CM

## 2012-01-21 DIAGNOSIS — I350 Nonrheumatic aortic (valve) stenosis: Secondary | ICD-10-CM

## 2012-01-21 DIAGNOSIS — I491 Atrial premature depolarization: Secondary | ICD-10-CM

## 2012-01-21 NOTE — Assessment & Plan Note (Signed)
EKG today shows sinus tachycardia and frequent PACs.  She has been taking one half tablet of Toprol at bedtime and we will increase her to a full 50 mg Toprol at bedtime.

## 2012-01-21 NOTE — Progress Notes (Signed)
[name] [name] Date of Birth:  03-29-1930 [facility] [address] [place], [place]  [address] [phone]  Fax   [phone]  HPI: This pleasant 79 year old woman is seen back for a one-year followup office visit.  She has a past history of mild aortic stenosis.  Her last echocardiogram was 10/18/10 and showed an ejection fraction of 60-65% with mild aortic stenosis with peak gradient of 22 and mean gradient of 11. The patient has a past history of premature atrial beats.  She recently was seen in the recovery room following surgery at [facility] where her not to was initially felt to show new atrial fibrillation but on careful review she was having sinus rhythm with frequent premature atrial contractions.  These are asymptomatic.  The patient has not been experiencing any chest pain or shortness of breath  Current Outpatient Prescriptions  Medication Sig Dispense Refill  . aspirin 81 MG tablet Take 81 mg by mouth every Monday, Wednesday, and Friday. Take 1 on Monday, Wednesday, and Friday only      . Biotin (BIOTIN 5000) 5 MG CAPS Take 5,000 mg by mouth at bedtime.       . Blood Glucose Monitoring Suppl (ONE TOUCH ULTRA SYSTEM KIT) W/DEVICE KIT 1 kit by Does not apply route once.      . Calcium Carbonate-Vitamin D (CALCIUM + D PO) Take 1 tablet by mouth 2 (two) times daily.       Marland Kitchen CRANBERRY FRUIT PO Take 1 tablet by mouth every morning.       . fentaNYL (DURAGESIC - DOSED MCG/HR) 50 MCG/HR Place 1 patch onto the skin every 3 (three) days.        . furosemide (LASIX) 20 MG tablet Take 20 mg by mouth as needed.        . gabapentin (NEURONTIN) 300 MG capsule Take 900 mg by mouth 3 (three) times daily.       Marland Kitchen HYDROcodone-acetaminophen (LORTAB 10) 10-500 MG per tablet Take 1 tablet by mouth every 6 (six) hours as needed for pain.  30 tablet  0  . HYDROcodone-acetaminophen (VICODIN) 5-500 MG per tablet Take 1 tablet by mouth every 6 (six) hours as needed.          . IRON PO Take by mouth.        . Lancets MISC by Does not apply route.      Marland Kitchen lisinopril-hydrochlorothiazide (PRINZIDE,ZESTORETIC) 20-25 MG per tablet Take 0.5 tablets by mouth daily.       . metFORMIN (GLUCOPHAGE) 1000 MG tablet Take 1,000 mg by mouth 2 (two) times daily with a meal.      . metoprolol (TOPROL-XL) 50 MG 24 hr tablet Take 50 mg by mouth at bedtime.       . Multiple Vitamin (MULTIVITAMIN PO) Take by mouth daily.        . pantoprazole (PROTONIX) 40 MG tablet Take 40 mg by mouth every morning.       . Rosuvastatin Calcium (CRESTOR PO) Take 5 mg by mouth at bedtime.         Allergies  Allergen Reactions  . Codeine Nausea And Vomiting  . Demerol Other (See Comments)    Drunk feeling"  . Statins Other (See Comments)    "hurt"   . Zetia (Ezetimibe) Other (See Comments)    hurt    Patient Active Problem List  Diagnosis  . Chronic or unspecified duodenal ulcer with hemorrhage, without mention of obstruction  .  Pure hypercholesterolemia  . Benign hypertensive heart disease without heart failure  . Diabetes mellitus without mention of complication  . Aortic stenosis, mild  . PAC (premature atrial contraction)    History  Smoking status  . Former Smoker -- 0.5 packs/day for 5 years  . Types: Cigarettes  . Quit date: 01/14/1980  Smokeless tobacco  . Never Used    History  Alcohol Use No    Family History  Problem Relation Age of Onset  . Other Mother     died @ 11, complications following childbirth  . COPD Father     died @ 45  . Heart failure Father   . Irregular heart beat Sister     1 sister with PPM.    Review of Systems: The patient denies any heat or cold intolerance.  No weight gain or weight loss.  The patient denies headaches or blurry vision.  There is no cough or sputum production.  The patient denies dizziness.  There is no hematuria or hematochezia.  The patient denies any muscle aches or arthritis.  The patient denies any rash.  The patient  denies frequent falling or instability.  There is no history of depression or anxiety.  All other systems were reviewed and are negative.   Physical Exam: Filed Vitals:   01/21/12 1519  BP: 120/70  Pulse: 100  Resp: 18   the general appearance reveals a well-developed well-nourished woman in no distress.The head and neck exam reveals pupils equal and reactive.  Extraocular movements are full.  There is no scleral icterus.  The mouth and pharynx are normal.  The neck is supple.  The carotids reveal no bruits.  The jugular venous pressure is normal.  The  thyroid is not enlarged.  There is no lymphadenopathy.  The chest is clear to percussion and auscultation.  There are no rales or rhonchi.  Expansion of the chest is symmetrical.  The precordium is quiet.  The pulse is irregular because of frequent PACs  The first heart sound is normal.  The second heart sound is physiologically split.  There is no murmur gallop rub or click.  There is no abnormal lift or heave.  The abdomen is soft and nontender.  The bowel sounds are normal.  The liver and spleen are not enlarged.  There are no abdominal masses.  There are no abdominal bruits.  Extremities reveal good pedal pulses.  There is no phlebitis or edema.  There is no cyanosis or clubbing.  Strength is normal and symmetrical in all extremities.  There is no lateralizing weakness.  There are no sensory deficits.  The skin is warm and dry.  There is no rash.   EKG confirms sinus tachycardia and frequent PACs and in no acute ischemic changes.   Assessment / Plan: Continue same medication except increase Toprol to 50 mg at bedtime. Recheck in one year for followup office visit or sooner when necessary.  Repeat EKG at office visit.

## 2012-01-21 NOTE — Assessment & Plan Note (Signed)
The patient has not had any symptoms referable to her aortic stenosis.  No symptoms of CHF or dizziness or angina pectoris

## 2012-01-21 NOTE — Assessment & Plan Note (Signed)
Blood pressure was remaining stable on current therapy 

## 2012-01-21 NOTE — Patient Instructions (Addendum)
Increase your Toprol to 50 mg (full tablet) at bedtime  Your physician wants you to follow-up in: 1 year You will receive a reminder letter in the mail two months in advance. If you don't receive a letter, please call our office to schedule the follow-up appointment.

## 2012-01-29 ENCOUNTER — Encounter (INDEPENDENT_AMBULATORY_CARE_PROVIDER_SITE_OTHER): Payer: Self-pay | Admitting: General Surgery

## 2012-01-29 ENCOUNTER — Ambulatory Visit (INDEPENDENT_AMBULATORY_CARE_PROVIDER_SITE_OTHER): Payer: Medicare Other | Admitting: General Surgery

## 2012-01-29 VITALS — BP 141/81 | HR 76 | Temp 97.8°F | Resp 16 | Ht 63.0 in | Wt 201.4 lb

## 2012-01-29 DIAGNOSIS — R222 Localized swelling, mass and lump, trunk: Secondary | ICD-10-CM

## 2012-01-29 DIAGNOSIS — R229 Localized swelling, mass and lump, unspecified: Secondary | ICD-10-CM

## 2012-01-29 NOTE — Progress Notes (Signed)
Patient ID: [name] [name], female   DOB: 1930/11/06, 79 y.o.   MRN: [id number] The patient is a 79 year old female status post a right back mass excision. Patient has been doing well postoperatively. Patient had minimal pain. Patient had an episode of PACs and PACU Dr. Patty Sermons saw her  And is currently followed up with her at this time.  Pathology epidermal inclusion cyst. Discussed with the patient.  On exam: Wounds clean dry and intact there is minimal amount of bruising around the area. There is no erythema or drainage.  Assessment and plan: 79 year old female status post back mass excision. Patient to follow up when necessary

## 2012-04-13 ENCOUNTER — Encounter: Payer: Self-pay | Admitting: Physical Medicine & Rehabilitation

## 2012-05-07 ENCOUNTER — Ambulatory Visit (HOSPITAL_BASED_OUTPATIENT_CLINIC_OR_DEPARTMENT_OTHER): Payer: Medicare Other | Admitting: Physical Medicine & Rehabilitation

## 2012-05-07 ENCOUNTER — Encounter: Payer: Self-pay | Admitting: Physical Medicine & Rehabilitation

## 2012-05-07 ENCOUNTER — Encounter: Payer: Medicare Other | Attending: Physical Medicine & Rehabilitation

## 2012-05-07 VITALS — BP 142/51 | HR 95 | Resp 14 | Ht 65.0 in | Wt 203.0 lb

## 2012-05-07 DIAGNOSIS — I10 Essential (primary) hypertension: Secondary | ICD-10-CM | POA: Insufficient documentation

## 2012-05-07 DIAGNOSIS — Z96659 Presence of unspecified artificial knee joint: Secondary | ICD-10-CM | POA: Insufficient documentation

## 2012-05-07 DIAGNOSIS — Z5181 Encounter for therapeutic drug level monitoring: Secondary | ICD-10-CM

## 2012-05-07 DIAGNOSIS — M47817 Spondylosis without myelopathy or radiculopathy, lumbosacral region: Secondary | ICD-10-CM | POA: Insufficient documentation

## 2012-05-07 DIAGNOSIS — E1142 Type 2 diabetes mellitus with diabetic polyneuropathy: Secondary | ICD-10-CM | POA: Insufficient documentation

## 2012-05-07 DIAGNOSIS — M533 Sacrococcygeal disorders, not elsewhere classified: Secondary | ICD-10-CM | POA: Insufficient documentation

## 2012-05-07 DIAGNOSIS — M5137 Other intervertebral disc degeneration, lumbosacral region: Secondary | ICD-10-CM | POA: Insufficient documentation

## 2012-05-07 DIAGNOSIS — Z79899 Other long term (current) drug therapy: Secondary | ICD-10-CM

## 2012-05-07 DIAGNOSIS — K219 Gastro-esophageal reflux disease without esophagitis: Secondary | ICD-10-CM | POA: Insufficient documentation

## 2012-05-07 DIAGNOSIS — M545 Low back pain, unspecified: Secondary | ICD-10-CM | POA: Insufficient documentation

## 2012-05-07 DIAGNOSIS — E1149 Type 2 diabetes mellitus with other diabetic neurological complication: Secondary | ICD-10-CM

## 2012-05-07 DIAGNOSIS — E669 Obesity, unspecified: Secondary | ICD-10-CM | POA: Insufficient documentation

## 2012-05-07 DIAGNOSIS — E785 Hyperlipidemia, unspecified: Secondary | ICD-10-CM | POA: Insufficient documentation

## 2012-05-07 DIAGNOSIS — M51379 Other intervertebral disc degeneration, lumbosacral region without mention of lumbar back pain or lower extremity pain: Secondary | ICD-10-CM | POA: Insufficient documentation

## 2012-05-07 DIAGNOSIS — G8929 Other chronic pain: Secondary | ICD-10-CM | POA: Insufficient documentation

## 2012-05-07 DIAGNOSIS — M1990 Unspecified osteoarthritis, unspecified site: Secondary | ICD-10-CM | POA: Insufficient documentation

## 2012-05-07 NOTE — Addendum Note (Signed)
Addended by: Doreene Eland on: 05/07/2012 01:09 PM   Modules accepted: Orders

## 2012-05-07 NOTE — Progress Notes (Signed)
Subjective:    Patient ID: [name] [name], female    DOB: December 15, 1930, 79 y.o.   MRN: [id number] Chart review. Notes from Dr. Thyra Breed as well as Dr. Uvaldo Rising.A one-inch stack of records have been reviewed for this examination 79 year old female with chronic low back pain. She has had workup and treatment from orthopedics, physical medicine rehabilitation as well anesthesia/pain management. She has been maintained chronically on fentanyl patch 50 mcg every 3 days as well as hydrocodone 5 mg twice a day and gabapentin 300 or milligrams 3 times per day. No aberrant drug behavior has been documented. Patient has had bilateral sacroiliac injections in September of 2013 and left L3-L4 medial branch and left L5 dorsal ramus injection in December of 2013 which have been reportedly helpful. X-rays from September of 2013 have been reviewed. She has severe multilevel degenerative changes in the facet joints as well as disc degeneration and osteophyte formation.  Her past medical history is also significant for lumbar spinal stenosis. I do not see any MRI or CT scans in her records however in addition she has a history of diabetes with severe parietal neuropathy hyperlipidemia diffuse osteoarthritis hypertension and obesity. HPI  Please see chart review Chief complaint is left-sided low back pain which intermittently causes left thigh pain. She had an exacerbation a month or 2 ago which has subsided. She said good relief with bilateral sacroiliac injections She had good relief with the left L3 L4-L5 facet medial medial branch block and was planning to have a radiofrequency procedure when her physician stopped taking her insurance  Patient is functional independent. She does some of her housework such as changing the linens on her bed. She uses on average 1 hydrocodone per day and changes her fentanyl patch every 3 months. She's been on these medications for number years. She is wondering whether she is  developing a tolerance for these medications.  Neuropathy symptoms in both feet left greater than right she starting gets some tingling of the fingertips Pain Inventory Average Pain 7 Pain Right Now 7 My pain is dull  In the last 24 hours, has pain interfered with the following? General activity 5 Relation with others 6 Enjoyment of life 7 What TIME of day is your pain at its worst? daytime Sleep (in general) Fair  Pain is worse with: walking and bending Pain improves with: rest and medication Relief from Meds: 8  Mobility use a cane do you drive?  yes  Function not employed: date last employed 01/13/1995  Neuro/Psych numbness tingling  Prior Studies Any changes since last visit?  no  Physicians involved in your care Any changes since last visit?  no   Family History  Problem Relation Age of Onset  . Other Mother     died @ 10, complications following childbirth  . COPD Father     died @ 1  . Heart failure Father   . Irregular heart beat Sister     1 sister with PPM.   History   Social History  . Marital Status: Married    Spouse Name: N/A    Number of Children: N/A  . Years of Education: N/A   Social History Main Topics  . Smoking status: Former Smoker -- 0.50 packs/day for 5 years    Types: Cigarettes    Quit date: 01/14/1980  . Smokeless tobacco: Never Used  . Alcohol Use: No  . Drug Use: No  . Sexually Active:    Other Topics Concern  .  None   Social History Narrative   Lives with Husband in [place].  Has a dtr in [place], [place] and a son in [place], [place].   Past Surgical History  Procedure Laterality Date  . Vesicovaginal fistula closure w/ tah  1964  . US echocardiography  04-26-09    EF 55-60%  . Cardiovascular stress test  05-13-2005    EF 67%  . Knee surgery    . Joint replacement  2000    left knee  . Abdominal hysterectomy  1964  . Toe shortened      right 2nd toe  . Cataract extraction      both eyes  . Mass excision   01/13/2012    Procedure: EXCISION MASS;  Surgeon: Axel Filler, MD;  Location: [facility];  Service: General;  Laterality: Right;  Excision of Right Back Mass   Past Medical History  Diagnosis Date  . Heart murmur     She has a known heart murmur of aortic valve stenosis  . Chronic back pain     She does have a hx of   . Aortic stenosis     a. 10/2010 Echo: EF 60-65%, mild LVH, no rwma, mild AS.  . Diabetes mellitus     adult onset   . Peripheral neuropathy   . GERD (gastroesophageal reflux disease)   . Hypertension   . Pneumonia     hx of  . Bronchitis     hx of  . Cancer 2013    basal cell carcinoma   BP 142/51  Pulse 95  Resp 14  Ht 5\' 5"  (1.651 m)  Wt 203 lb (92.08 kg)  BMI 33.78 kg/m2  SpO2 95%    Review of Systems  Musculoskeletal: Positive for back pain.  Neurological: Positive for numbness.       Tingling  All other systems reviewed and are negative.       Objective:   Physical Exam  Nursing note and vitals reviewed. Constitutional: She is oriented to person, place, and time. She appears well-developed and well-nourished.  HENT:  Head: Normocephalic and atraumatic.  Eyes: Conjunctivae and EOM are normal. Pupils are equal, round, and reactive to light.  Neck: Normal range of motion.  Musculoskeletal:       Right shoulder: She exhibits decreased range of motion. She exhibits no tenderness, no deformity and no spasm.  No clinical evidence of scoliosis  Neurological: She is alert and oriented to person, place, and time. She has normal strength. A sensory deficit is present. She exhibits normal muscle tone. Gait abnormal. Coordination normal.  Reflex Scores:      Tricep reflexes are 0 on the right side and 0 on the left side.      Bicep reflexes are 0 on the right side and 0 on the left side.      Brachioradialis reflexes are 0 on the right side and 0 on the left side.      Patellar reflexes are 0 on the right side and 0 on the left side.      Achilles  reflexes are 0 on the left side. Decreased step length increased base of support  Psychiatric: She has a normal mood and affect. Her behavior is normal. Judgment and thought content normal.  Decreased sensation below the mid calf level I laterally to pinprick No evidence of skin breakdown on the feet or toes        Assessment & Plan:  1. Severe lumbar spondylosis and  severe lumbar degenerative disc disease causing severe chronic pain. She has responded in the past to both sacroiliac injections as well as medial branch blocks. Given that she is primarily left-sided low back pain we'll repeat left L3-L4 medial branch and left L5 dorsal ramus injection. Depending on her response she may benefit from radiofrequency of the same nerves. 2. Severe diabetic neuropathy no pinprick sensation below mid calf. No evidence of skin breakdown. Monitor, diabetic control, gabapentin 900 mg 3 times per day

## 2012-05-27 ENCOUNTER — Ambulatory Visit (HOSPITAL_BASED_OUTPATIENT_CLINIC_OR_DEPARTMENT_OTHER): Payer: Medicare Other | Admitting: Physical Medicine & Rehabilitation

## 2012-05-27 ENCOUNTER — Encounter: Payer: Self-pay | Admitting: Physical Medicine & Rehabilitation

## 2012-05-27 ENCOUNTER — Encounter: Payer: Medicare Other | Attending: Physical Medicine & Rehabilitation

## 2012-05-27 VITALS — BP 146/61 | HR 113 | Resp 17 | Ht 65.0 in | Wt 206.0 lb

## 2012-05-27 DIAGNOSIS — E1149 Type 2 diabetes mellitus with other diabetic neurological complication: Secondary | ICD-10-CM | POA: Insufficient documentation

## 2012-05-27 DIAGNOSIS — G8929 Other chronic pain: Secondary | ICD-10-CM | POA: Insufficient documentation

## 2012-05-27 DIAGNOSIS — I10 Essential (primary) hypertension: Secondary | ICD-10-CM | POA: Insufficient documentation

## 2012-05-27 DIAGNOSIS — M5137 Other intervertebral disc degeneration, lumbosacral region: Secondary | ICD-10-CM | POA: Insufficient documentation

## 2012-05-27 DIAGNOSIS — M47817 Spondylosis without myelopathy or radiculopathy, lumbosacral region: Secondary | ICD-10-CM | POA: Insufficient documentation

## 2012-05-27 DIAGNOSIS — M545 Low back pain, unspecified: Secondary | ICD-10-CM | POA: Insufficient documentation

## 2012-05-27 DIAGNOSIS — M1990 Unspecified osteoarthritis, unspecified site: Secondary | ICD-10-CM | POA: Insufficient documentation

## 2012-05-27 DIAGNOSIS — K219 Gastro-esophageal reflux disease without esophagitis: Secondary | ICD-10-CM | POA: Insufficient documentation

## 2012-05-27 DIAGNOSIS — M51379 Other intervertebral disc degeneration, lumbosacral region without mention of lumbar back pain or lower extremity pain: Secondary | ICD-10-CM | POA: Insufficient documentation

## 2012-05-27 DIAGNOSIS — E669 Obesity, unspecified: Secondary | ICD-10-CM | POA: Insufficient documentation

## 2012-05-27 DIAGNOSIS — E785 Hyperlipidemia, unspecified: Secondary | ICD-10-CM | POA: Insufficient documentation

## 2012-05-27 DIAGNOSIS — E1142 Type 2 diabetes mellitus with diabetic polyneuropathy: Secondary | ICD-10-CM | POA: Insufficient documentation

## 2012-05-27 DIAGNOSIS — Z96659 Presence of unspecified artificial knee joint: Secondary | ICD-10-CM | POA: Insufficient documentation

## 2012-05-27 NOTE — Progress Notes (Signed)
   PROCEDURE RECORD [facility]   Name: [name] [name] DOB:17-Feb-1930 MRN: [id number]  Date:05/27/2012  Physician: Claudette Laws, MD    Nurse/CMA: Shumaker RN  Allergies:  Allergies  Allergen Reactions  . Codeine Nausea And Vomiting  . Demerol Other (See Comments)    Drunk feeling"  . Statins Other (See Comments)    "hurt"   . Zetia (Ezetimibe) Other (See Comments)    hurt    Consent Signed: yes  Is patient diabetic? yes  CBG today? 125  Pregnant: no LMP: No LMP recorded. Patient has had a hysterectomy. (age 44-55)  Anticoagulants: no Anti-inflammatory: no Antibiotics: no  Procedure: left medial branch block  Position: Prone Start Time: 12:25  End Time: 12:31  Fluoro Time: 16 seconds  RN/CMA Facilities manager RN    Time 1156 12:34    BP 146/61 154/56    Pulse 113 96    Respirations 16 16    O2 Sat 92 90    S/S 6 6    Pain Level 5 0     D/C home with Husband, patient A & O X 3, D/C instructions reviewed, and sits independently.

## 2012-05-27 NOTE — Progress Notes (Signed)
Left Lumbar L3, L4  medial branch blocks and L 5 dorsal ramus injection under fluoroscopic guidance   Indication: Left Lumbar pain which is not relieved by medication management or other conservative care and interfering with self-care and mobility.  Informed consent was obtained after describing risks and benefits of the procedure with the patient, this includes bleeding, bruising, infection, paralysis and medication side effects.  The patient wishes to proceed and has given written consent.  The patient was placed in a prone position.  The lumbar area was marked and prepped with Betadine.  One mL of 1% lidocaine was injected into each of 3 areas into the skin and subcutaneous tissue.  Then a 22-gauge 3.5 spinal needle was inserted targeting the junction of the left S1 superior articular process and sacral ala junction.  Needle was advanced under fluoroscopic guidance.  Bone contact was made.  Omnipaque 180 was injected x 0.5 mL demonstrating no intravascular uptake.  Then a solution containing one mL of 4 mg per mL dexamethasone and 3 mL of 2% MPF lidocaine was injected x 0.5 mL.  Then the left L5 superior articular process in transverse process junction was targeted.  Bone contact was made.  Omnipaque 180 was injected x 0.5 mL demonstrating no intravascular uptake.  Then a solution containing one mL of 4 mg per mL dexamethasone and 3 mL of 2% MPF lidocaine was injected x 0.5 mL.  Then the left L4 superior articular process in transverse process junction was targeted.  Bone contact was made.  Omnipaque 180 was injected x 0.5 mL demonstrating no intravascular uptake.  Then a solution containing one mL of 4 mg per mL dexamethasone and 3 mL of 2% MPF lidocaine was injected x 0.5 mL.  Patient tolerated procedure well.  Post procedure instructions were given. 

## 2012-05-27 NOTE — Patient Instructions (Signed)

## 2012-05-31 ENCOUNTER — Encounter: Payer: Self-pay | Admitting: Cardiology

## 2012-06-01 ENCOUNTER — Encounter: Payer: Self-pay | Admitting: Cardiology

## 2012-06-17 ENCOUNTER — Encounter: Payer: Self-pay | Admitting: Physical Medicine & Rehabilitation

## 2012-06-17 ENCOUNTER — Encounter: Payer: Medicare Other | Attending: Physical Medicine & Rehabilitation

## 2012-06-17 ENCOUNTER — Ambulatory Visit (HOSPITAL_BASED_OUTPATIENT_CLINIC_OR_DEPARTMENT_OTHER): Payer: Medicare Other | Admitting: Physical Medicine & Rehabilitation

## 2012-06-17 VITALS — BP 101/45 | HR 102 | Resp 14 | Ht 62.0 in | Wt 204.0 lb

## 2012-06-17 DIAGNOSIS — Z96659 Presence of unspecified artificial knee joint: Secondary | ICD-10-CM | POA: Insufficient documentation

## 2012-06-17 DIAGNOSIS — M545 Low back pain, unspecified: Secondary | ICD-10-CM | POA: Insufficient documentation

## 2012-06-17 DIAGNOSIS — K219 Gastro-esophageal reflux disease without esophagitis: Secondary | ICD-10-CM | POA: Insufficient documentation

## 2012-06-17 DIAGNOSIS — M51379 Other intervertebral disc degeneration, lumbosacral region without mention of lumbar back pain or lower extremity pain: Secondary | ICD-10-CM | POA: Insufficient documentation

## 2012-06-17 DIAGNOSIS — E669 Obesity, unspecified: Secondary | ICD-10-CM | POA: Insufficient documentation

## 2012-06-17 DIAGNOSIS — M47817 Spondylosis without myelopathy or radiculopathy, lumbosacral region: Secondary | ICD-10-CM | POA: Insufficient documentation

## 2012-06-17 DIAGNOSIS — E785 Hyperlipidemia, unspecified: Secondary | ICD-10-CM | POA: Insufficient documentation

## 2012-06-17 DIAGNOSIS — G8929 Other chronic pain: Secondary | ICD-10-CM | POA: Insufficient documentation

## 2012-06-17 DIAGNOSIS — E1149 Type 2 diabetes mellitus with other diabetic neurological complication: Secondary | ICD-10-CM | POA: Insufficient documentation

## 2012-06-17 DIAGNOSIS — M1990 Unspecified osteoarthritis, unspecified site: Secondary | ICD-10-CM | POA: Insufficient documentation

## 2012-06-17 DIAGNOSIS — M5137 Other intervertebral disc degeneration, lumbosacral region: Secondary | ICD-10-CM | POA: Insufficient documentation

## 2012-06-17 DIAGNOSIS — E1142 Type 2 diabetes mellitus with diabetic polyneuropathy: Secondary | ICD-10-CM | POA: Insufficient documentation

## 2012-06-17 DIAGNOSIS — I10 Essential (primary) hypertension: Secondary | ICD-10-CM | POA: Insufficient documentation

## 2012-06-17 NOTE — Progress Notes (Signed)
Subjective:    Patient ID: [name] [name], female    DOB: 01-15-1930, 79 y.o.   MRN: [id number]  HPI Excellent back pain relief since MBB L L3-4-5 May 15 Has been doing more gardening, picking up sticks. Has not used any when necessary hydrocodone since the injection  R knee pain plan is to followup with her orthopedist Dr. Thomasena Edis next week Pain Inventory Average Pain 3 Pain Right Now 3 My pain is dull  In the last 24 hours, has pain interfered with the following? General activity 5 Relation with others 7 Enjoyment of life 5 What TIME of day is your pain at its worst? varies Sleep (in general) Fair  Pain is worse with: walking, bending and standing Pain improves with: rest and medication Relief from Meds: 8  Mobility use a cane ability to climb steps?  yes do you drive?  yes  Function retired  Neuro/Psych bladder control problems trouble walking  Prior Studies Any changes since last visit?  no  Physicians involved in your care Any changes since last visit?  no   Family History  Problem Relation Age of Onset  . Other Mother     died @ 28, complications following childbirth  . COPD Father     died @ 31  . Heart failure Father   . Irregular heart beat Sister     1 sister with PPM.   History   Social History  . Marital Status: Married    Spouse Name: N/A    Number of Children: N/A  . Years of Education: N/A   Social History Main Topics  . Smoking status: Former Smoker -- 0.50 packs/day for 5 years    Types: Cigarettes    Quit date: 01/14/1980  . Smokeless tobacco: Never Used  . Alcohol Use: No  . Drug Use: No  . Sexually Active:    Other Topics Concern  . None   Social History Narrative   Lives with Husband in [place].  Has a dtr in [place], [place] and a son in [place], [place].   Past Surgical History  Procedure Laterality Date  . Vesicovaginal fistula closure w/ tah  1964  . US echocardiography  04-26-09    EF 55-60%  . Cardiovascular stress  test  05-13-2005    EF 67%  . Knee surgery    . Joint replacement  2000    left knee  . Abdominal hysterectomy  1964  . Toe shortened      right 2nd toe  . Cataract extraction      both eyes  . Mass excision  01/13/2012    Procedure: EXCISION MASS;  Surgeon: Axel Filler, MD;  Location: [facility];  Service: General;  Laterality: Right;  Excision of Right Back Mass   Past Medical History  Diagnosis Date  . Heart murmur     She has a known heart murmur of aortic valve stenosis  . Chronic back pain     She does have a hx of   . Aortic stenosis     a. 10/2010 Echo: EF 60-65%, mild LVH, no rwma, mild AS.  . Diabetes mellitus     adult onset   . Peripheral neuropathy   . GERD (gastroesophageal reflux disease)   . Hypertension   . Pneumonia     hx of  . Bronchitis     hx of  . Cancer 2013    basal cell carcinoma   BP 101/45  Pulse 102  Resp 14  Ht 5\' 2"  (1.575 m)  Wt 204 lb (92.534 kg)  BMI 37.3 kg/m2  SpO2 92%     Review of Systems  Musculoskeletal: Positive for back pain and gait problem.  All other systems reviewed and are negative.       Objective:   Physical Exam  Nursing note and vitals reviewed. Constitutional: She is oriented to person, place, and time. She appears well-developed.  HENT:  Head: Normocephalic and atraumatic.  Eyes: Conjunctivae and EOM are normal. Pupils are equal, round, and reactive to light.  Musculoskeletal:       Right knee: She exhibits effusion.       Lumbar back: She exhibits decreased range of motion. She exhibits no tenderness, no deformity and no spasm.  Neurological: She is alert and oriented to person, place, and time. She has normal strength. No sensory deficit.  Psychiatric: She has a normal mood and affect.   Mild knee effusion Negative straight leg raising       Assessment & Plan:  1. Lumbar spondylosis which responded well to medial branch blocks. They were done on left side. We discussed that we could repeat them  in about 2-1/2 months if needed. Continue fentanyl patch prescribed by primary physician  2. Knee pain with effusion will followup with orthopedics

## 2012-06-17 NOTE — Patient Instructions (Addendum)
Continue current activities We can repeat the medial branch block next visit We discussed the longer lasting procedure call to radiofrequency neurotomy which we may need to do Ice to right knee 20-30 minutes as needed until you see Dr. Thomasena Edis

## 2012-08-02 ENCOUNTER — Other Ambulatory Visit (HOSPITAL_COMMUNITY): Payer: Self-pay | Admitting: Family Medicine

## 2012-08-02 DIAGNOSIS — Z1231 Encounter for screening mammogram for malignant neoplasm of breast: Secondary | ICD-10-CM

## 2012-08-09 ENCOUNTER — Ambulatory Visit (HOSPITAL_COMMUNITY)
Admission: RE | Admit: 2012-08-09 | Discharge: 2012-08-09 | Disposition: A | Discharge status: Home / Self Care | Payer: Medicare Other | Source: Ambulatory Visit | Attending: Family Medicine | Admitting: Family Medicine

## 2012-08-09 DIAGNOSIS — Z1231 Encounter for screening mammogram for malignant neoplasm of breast: Secondary | ICD-10-CM | POA: Insufficient documentation

## 2012-08-19 ENCOUNTER — Encounter: Payer: Medicare Other | Attending: Physical Medicine & Rehabilitation

## 2012-08-19 ENCOUNTER — Encounter: Payer: Self-pay | Admitting: Physical Medicine & Rehabilitation

## 2012-08-19 ENCOUNTER — Ambulatory Visit (HOSPITAL_BASED_OUTPATIENT_CLINIC_OR_DEPARTMENT_OTHER): Payer: Medicare Other | Admitting: Physical Medicine & Rehabilitation

## 2012-08-19 VITALS — BP 146/70 | HR 88 | Resp 14 | Ht 65.0 in | Wt 211.2 lb

## 2012-08-19 DIAGNOSIS — M47817 Spondylosis without myelopathy or radiculopathy, lumbosacral region: Secondary | ICD-10-CM | POA: Insufficient documentation

## 2012-08-19 DIAGNOSIS — K219 Gastro-esophageal reflux disease without esophagitis: Secondary | ICD-10-CM | POA: Insufficient documentation

## 2012-08-19 DIAGNOSIS — E785 Hyperlipidemia, unspecified: Secondary | ICD-10-CM | POA: Insufficient documentation

## 2012-08-19 DIAGNOSIS — M1990 Unspecified osteoarthritis, unspecified site: Secondary | ICD-10-CM | POA: Insufficient documentation

## 2012-08-19 DIAGNOSIS — Z96659 Presence of unspecified artificial knee joint: Secondary | ICD-10-CM | POA: Insufficient documentation

## 2012-08-19 DIAGNOSIS — E1149 Type 2 diabetes mellitus with other diabetic neurological complication: Secondary | ICD-10-CM | POA: Insufficient documentation

## 2012-08-19 DIAGNOSIS — E1142 Type 2 diabetes mellitus with diabetic polyneuropathy: Secondary | ICD-10-CM | POA: Insufficient documentation

## 2012-08-19 DIAGNOSIS — M5137 Other intervertebral disc degeneration, lumbosacral region: Secondary | ICD-10-CM | POA: Insufficient documentation

## 2012-08-19 DIAGNOSIS — M545 Low back pain, unspecified: Secondary | ICD-10-CM | POA: Insufficient documentation

## 2012-08-19 DIAGNOSIS — E669 Obesity, unspecified: Secondary | ICD-10-CM | POA: Insufficient documentation

## 2012-08-19 DIAGNOSIS — I10 Essential (primary) hypertension: Secondary | ICD-10-CM | POA: Insufficient documentation

## 2012-08-19 DIAGNOSIS — G8929 Other chronic pain: Secondary | ICD-10-CM | POA: Insufficient documentation

## 2012-08-19 DIAGNOSIS — M51379 Other intervertebral disc degeneration, lumbosacral region without mention of lumbar back pain or lower extremity pain: Secondary | ICD-10-CM | POA: Insufficient documentation

## 2012-08-19 NOTE — Progress Notes (Signed)
Subjective:    Patient ID: [name] [name], female    DOB: [birth year], 79 y.o.   MRN: [id number]  HPI Uses hydrocodone only when doing yardwork On fentanyl patch Left sided back is still improved Has midline back pain without radiation No driver for injection today Pain Inventory Average Pain 8 Pain Right Now 8 My pain is constant and dull  In the last 24 hours, has pain interfered with the following? General activity 8 Relation with others 9 Enjoyment of life 8 What TIME of day is your pain at its worst? morning Sleep (in general) Good  Pain is worse with: walking and standing Pain improves with: rest and medication Relief from Meds: 10  Mobility use a cane how many minutes can you walk? 10-15 do you drive?  yes  Function I need assistance with the following:  household duties  Neuro/Psych numbness trouble walking  Prior Studies Any changes since last visit?  no  Physicians involved in your care Any changes since last visit?  no   Family History  Problem Relation Age of Onset  . Other Mother     died @ 66, complications following childbirth  . COPD Father     died @ 57  . Heart failure Father   . Irregular heart beat Sister     1 sister with PPM.   History   Social History  . Marital Status: Married    Spouse Name: N/A    Number of Children: N/A  . Years of Education: N/A   Social History Main Topics  . Smoking status: Former Smoker -- 0.50 packs/day for 5 years    Types: Cigarettes    Quit date: 01/14/1980  . Smokeless tobacco: Never Used  . Alcohol Use: No  . Drug Use: No  . Sexually Active:    Other Topics Concern  . None   Social History Narrative   Lives with Husband in [place].  Has a dtr in [place], [place] and a son in [place], [place].   Past Surgical History  Procedure Laterality Date  . Vesicovaginal fistula closure w/ tah  1964  . US echocardiography  04-26-09    EF 55-60%  . Cardiovascular stress test  05-13-2005    EF 67%  .  Knee surgery    . Joint replacement  2000    left knee  . Abdominal hysterectomy  1964  . Toe shortened      right 2nd toe  . Cataract extraction      both eyes  . Mass excision  01/13/2012    Procedure: EXCISION MASS;  Surgeon: Axel Filler, MD;  Location: [facility];  Service: General;  Laterality: Right;  Excision of Right Back Mass   Past Medical History  Diagnosis Date  . Heart murmur     She has a known heart murmur of aortic valve stenosis  . Chronic back pain     She does have a hx of   . Aortic stenosis     a. 10/2010 Echo: EF 60-65%, mild LVH, no rwma, mild AS.  . Diabetes mellitus     adult onset   . Peripheral neuropathy   . GERD (gastroesophageal reflux disease)   . Hypertension   . Pneumonia     hx of  . Bronchitis     hx of  . Cancer 2013    basal cell carcinoma   BP 146/70  Pulse 88  Resp 14  Ht 5\' 5"  (1.651 m)  Wt 211  lb 3.2 oz (95.8 kg)  BMI 35.15 kg/m2  SpO2 96%   Review of Systems  Musculoskeletal: Positive for back pain and gait problem.  Neurological: Positive for numbness.  All other systems reviewed and are negative.       Objective:   Physical Exam  General obese female in no acute distress Mood and affect are appropriate Ambulation is mildly follow reflexes at the hips. Straight leg raising test is negative Lower extremity strength is normal Lumbar spine has reduced range of motion. Extension 0-25% flexion 50% of normal       Assessment & Plan:  1. Lumbar spondylosis without myelopathy. She's had good relief with left-sided medial branch blocks. Her symptoms seem to be more midline. I would recommend bilateral medial branch blocks. She has been I. chronic narcotic analgesics. Only intermittent use of hydrocodone however has been on fentanyl patch 50 mcg as well. Should we get better pain relief with the intervention of procedures we can start weaning down on the fentanyl patch. She does have about a 3 month supply of her narcotic  analgesics at this pt We can take over the prescription of this medication when her current supply runs out. I would anticipate she may be a good candidate for radiofrequency neurotomy in her lumbar area

## 2012-08-25 ENCOUNTER — Other Ambulatory Visit: Payer: Self-pay | Admitting: Gastroenterology

## 2012-08-25 DIAGNOSIS — R1310 Dysphagia, unspecified: Secondary | ICD-10-CM

## 2012-09-01 ENCOUNTER — Ambulatory Visit
Admission: RE | Admit: 2012-09-01 | Discharge: 2012-09-01 | Disposition: A | Discharge status: Home / Self Care | Payer: Medicare Other | Source: Ambulatory Visit | Attending: Gastroenterology | Admitting: Gastroenterology

## 2012-09-01 DIAGNOSIS — R1310 Dysphagia, unspecified: Secondary | ICD-10-CM

## 2012-09-16 ENCOUNTER — Encounter: Payer: Self-pay | Admitting: Physical Medicine & Rehabilitation

## 2012-09-16 ENCOUNTER — Encounter: Payer: Medicare Other | Attending: Physical Medicine & Rehabilitation

## 2012-09-16 ENCOUNTER — Ambulatory Visit (HOSPITAL_BASED_OUTPATIENT_CLINIC_OR_DEPARTMENT_OTHER): Payer: Medicare Other | Admitting: Physical Medicine & Rehabilitation

## 2012-09-16 VITALS — BP 159/69 | HR 61 | Resp 14 | Ht 65.0 in | Wt 209.2 lb

## 2012-09-16 DIAGNOSIS — M47817 Spondylosis without myelopathy or radiculopathy, lumbosacral region: Secondary | ICD-10-CM

## 2012-09-16 DIAGNOSIS — M545 Low back pain, unspecified: Secondary | ICD-10-CM | POA: Insufficient documentation

## 2012-09-16 DIAGNOSIS — I10 Essential (primary) hypertension: Secondary | ICD-10-CM | POA: Insufficient documentation

## 2012-09-16 DIAGNOSIS — E1149 Type 2 diabetes mellitus with other diabetic neurological complication: Secondary | ICD-10-CM | POA: Insufficient documentation

## 2012-09-16 DIAGNOSIS — G8929 Other chronic pain: Secondary | ICD-10-CM | POA: Insufficient documentation

## 2012-09-16 DIAGNOSIS — E785 Hyperlipidemia, unspecified: Secondary | ICD-10-CM | POA: Insufficient documentation

## 2012-09-16 DIAGNOSIS — Z96659 Presence of unspecified artificial knee joint: Secondary | ICD-10-CM | POA: Insufficient documentation

## 2012-09-16 DIAGNOSIS — E1142 Type 2 diabetes mellitus with diabetic polyneuropathy: Secondary | ICD-10-CM | POA: Insufficient documentation

## 2012-09-16 DIAGNOSIS — M51379 Other intervertebral disc degeneration, lumbosacral region without mention of lumbar back pain or lower extremity pain: Secondary | ICD-10-CM | POA: Insufficient documentation

## 2012-09-16 DIAGNOSIS — K219 Gastro-esophageal reflux disease without esophagitis: Secondary | ICD-10-CM | POA: Insufficient documentation

## 2012-09-16 DIAGNOSIS — M1990 Unspecified osteoarthritis, unspecified site: Secondary | ICD-10-CM | POA: Insufficient documentation

## 2012-09-16 DIAGNOSIS — E669 Obesity, unspecified: Secondary | ICD-10-CM | POA: Insufficient documentation

## 2012-09-16 DIAGNOSIS — M5137 Other intervertebral disc degeneration, lumbosacral region: Secondary | ICD-10-CM | POA: Insufficient documentation

## 2012-09-16 NOTE — Progress Notes (Signed)
Left Lumbar L3, L4  medial branch blocks and L 5 dorsal ramus injection under fluoroscopic guidance   Indication: Left Lumbar pain which is not relieved by medication management or other conservative care and interfering with self-care and mobility.  Informed consent was obtained after describing risks and benefits of the procedure with the patient, this includes bleeding, bruising, infection, paralysis and medication side effects.  The patient wishes to proceed and has given written consent.  The patient was placed in a prone position.  The lumbar area was marked and prepped with Betadine.  One mL of 1% lidocaine was injected into each of 3 areas into the skin and subcutaneous tissue.  Then a 22-gauge 3.5  spinal needle was inserted targeting the junction of the left S1 superior articular process and sacral ala junction.  Needle was advanced under fluoroscopic guidance.  Bone contact was made.  Omnipaque 180 was injected x 0.5 mL demonstrating no intravascular uptake.  Then a solution containing one mL of 4 mg per mL dexamethasone and 3 mL of 2% MPF lidocaine was injected x 0.5 mL.  Then the left L5 superior articular process in transverse process junction was targeted.  Bone contact was made.  Omnipaque 180 was injected x 0.5 mL demonstrating no intravascular uptake.  Then a solution containing one mL of 4 mg per mL dexamethasone and 3 mL of 2% MPF lidocaine was injected x 0.5 mL.  Then the left L4 superior articular process in transverse process junction was targeted.  Bone contact was made.  Omnipaque 180 was injected x 0.5 mL demonstrating no intravascular uptake.  Then a solution containing one mL of 4 mg per mL dexamethasone and 3 mL of 2% MPF lidocaine was injected x 0.5 mL.  Patient tolerated procedure well.  Post procedure instructions were given.  Return to clinic one month We will start describing the fentanyl patches however overall goal is to wean off

## 2012-09-16 NOTE — Progress Notes (Signed)
  PROCEDURE RECORD [facility]   Name: [name] [name] DOB:1930-06-25 MRN: [id number]  Date:09/16/2012  Physician: Claudette Laws, MD    Nurse/CMA:Blayke Pinera RN/ Levens CMA  Allergies:  Allergies  Allergen Reactions  . Codeine Nausea And Vomiting  . Demerol Other (See Comments)    Drunk feeling"  . Statins Other (See Comments)    "hurt"   . Zetia [Ezetimibe] Other (See Comments)    hurt    Consent Signed: yes  Is patient diabetic? yes  CBG today? 111( 09/15/12)  Pregnant: no LMP: No LMP recorded. Patient has had a hysterectomy. (age 36-55)  Anticoagulants: no Anti-inflammatory: no Antibiotics: no  Procedure: left medial branch block L3-4-5  Position: Prone Start Time: 12:43  End Time: 12:49  Fluoro Time: 18.3 secons  RN/CMA Designer, multimedia    Time 12:22 12:54    BP 159/69 153/67    Pulse 61 67    Respirations 14 14    O2 Sat 95 98    S/S 6 6    Pain Level 6/10 0/10     D/C home wit hhusband, patient A & O X 3, D/C instructions reviewed, and sits independently.

## 2012-09-16 NOTE — Patient Instructions (Signed)

## 2012-10-14 ENCOUNTER — Encounter: Payer: Self-pay | Admitting: Physical Medicine & Rehabilitation

## 2012-10-14 ENCOUNTER — Encounter: Payer: Medicare Other | Attending: Physical Medicine & Rehabilitation

## 2012-10-14 ENCOUNTER — Ambulatory Visit (HOSPITAL_BASED_OUTPATIENT_CLINIC_OR_DEPARTMENT_OTHER): Payer: Medicare Other | Admitting: Physical Medicine & Rehabilitation

## 2012-10-14 VITALS — BP 142/74 | HR 76 | Resp 14 | Ht 65.0 in | Wt 205.4 lb

## 2012-10-14 DIAGNOSIS — G8929 Other chronic pain: Secondary | ICD-10-CM | POA: Insufficient documentation

## 2012-10-14 DIAGNOSIS — M545 Low back pain, unspecified: Secondary | ICD-10-CM | POA: Insufficient documentation

## 2012-10-14 DIAGNOSIS — Z96659 Presence of unspecified artificial knee joint: Secondary | ICD-10-CM | POA: Insufficient documentation

## 2012-10-14 DIAGNOSIS — I10 Essential (primary) hypertension: Secondary | ICD-10-CM | POA: Insufficient documentation

## 2012-10-14 DIAGNOSIS — E785 Hyperlipidemia, unspecified: Secondary | ICD-10-CM | POA: Insufficient documentation

## 2012-10-14 DIAGNOSIS — M47817 Spondylosis without myelopathy or radiculopathy, lumbosacral region: Secondary | ICD-10-CM | POA: Insufficient documentation

## 2012-10-14 DIAGNOSIS — M51379 Other intervertebral disc degeneration, lumbosacral region without mention of lumbar back pain or lower extremity pain: Secondary | ICD-10-CM | POA: Insufficient documentation

## 2012-10-14 DIAGNOSIS — M1990 Unspecified osteoarthritis, unspecified site: Secondary | ICD-10-CM | POA: Insufficient documentation

## 2012-10-14 DIAGNOSIS — E1142 Type 2 diabetes mellitus with diabetic polyneuropathy: Secondary | ICD-10-CM | POA: Insufficient documentation

## 2012-10-14 DIAGNOSIS — E669 Obesity, unspecified: Secondary | ICD-10-CM | POA: Insufficient documentation

## 2012-10-14 DIAGNOSIS — M5137 Other intervertebral disc degeneration, lumbosacral region: Secondary | ICD-10-CM | POA: Insufficient documentation

## 2012-10-14 DIAGNOSIS — K219 Gastro-esophageal reflux disease without esophagitis: Secondary | ICD-10-CM | POA: Insufficient documentation

## 2012-10-14 DIAGNOSIS — E1149 Type 2 diabetes mellitus with other diabetic neurological complication: Secondary | ICD-10-CM | POA: Insufficient documentation

## 2012-10-14 NOTE — Patient Instructions (Signed)
Radiofrequency Lesioning Radiofrequency lesioning is a procedure to relieve pain. The procedure is often used for back, neck, or arm pain. Radiofrequency lesioning uses a specialized machine that creates radio waves to make heat. The heat damages the nerve that carries the pain signal. Pain relief usually lasts 6 months to 1 year.  LET YOUR CAREGIVER KNOW ABOUT:  Allergies to food or medicine.  Medicines taken, including vitamins, herbs, eyedrops, over-the-counter medicines, and creams.  Use of steroids (by mouth or creams).  Previous problems with anesthetics or numbing medicines.  History of bleeding problems or blood clots.  Previous surgery.  Other health problems, including diabetes and kidney problems.  Possibility of pregnancy, if this applies.  Breathing problems and smoking history.  Any recent colds or infections. RISKS AND COMPLICATIONS This procedure is generally safe. The risks and complications depend on what treatment site is used. General complications may include:  Pain or soreness at the injection site.  Infection at the injection site.  Damage to nerves or blood vessels. BEFORE THE PROCEDURE  Ask your caregiver about changing or stopping any medicines you are on before the procedure.  If you take blood thinners, ask if you should stop taking them before the procedure.  You may be asked to wash with an antibiotic soap before the procedure.  Do not eat or drink for 8 hours before your procedure or as told by your caregiver.  Ask your caregiver what time you need to arrive for your procedure.  This is an outpatient procedure. This means you will be able to go home the same day. Make plans for someone to drive you home. PROCEDURE  You will be awake during the procedure. You need to be able to talk to the surgeon during the procedure. However, you might be given medicine to help you relax (sedative).  Medicine to numb the area (local anesthetic) will be  injected.  With the help of a type of X-ray (fluoroscopy), a radio frequency needle will be inserted into the area to be treated. Then, a wire that carries the radio waves (electrode) will be put through the radio frequency needle. An electrical pulse will be sent through the electrode to verify the correct nerve.  You will feel a tingling sensation similar to hitting your "funny bone." You may also have muscle twitching. The tissue around the needle tip is then heated when electric current is passed using the radio frequency machine. This numbs the nerves.  A bandage (dressing) will be put on the area after the procedure is done. AFTER THE PROCEDURE  You will stay in a recovery area until you are awake enough to eat and drink.  Once everything is back to normal, you will be able to go home.  You will need to arrange for someone to drive you home if you received a sedative or pain relieving medicine during the procedure. Document Released: 08/28/2010 Document Revised: 03/24/2011 Document Reviewed: 08/28/2010 [facility] Patient Information 2014 [place], [place].

## 2012-10-14 NOTE — Progress Notes (Signed)
Subjective:    Patient ID: [name] [name], female    DOB: 1930/07/14, 79 y.o.   MRN: [id number]  HPI Left Lumbar L3, L4 medial branch blocks and L 5 dorsal ramus injection under fluoroscopic guidance  On September 4. Helped 100% for 3 weeks and now is starting to wear off  Using fentanyl patch 50 mcg every 3 days, by mistake she left the last one on for 4 days and did not notice any difference We discussed methods to wean. We discussed changing every 4 days and monitoring the pain level on that fourth day Pain Inventory Average Pain 4 Pain Right Now 5 My pain is dull  In the last 24 hours, has pain interfered with the following? General activity 1 Relation with others 0 Enjoyment of life 1 What TIME of day is your pain at its worst? evening Sleep (in general) Fair  Pain is worse with: walking, bending and standing Pain improves with: rest and medication Relief from Meds: 7  Mobility walk with assistance use a cane do you drive?  yes  Function not employed: date last employed na  Neuro/Psych No problems in this area  Prior Studies Any changes since last visit?  no  Physicians involved in your care Any changes since last visit?  no   Family History  Problem Relation Age of Onset  . Other Mother     died @ 58, complications following childbirth  . COPD Father     died @ 29  . Heart failure Father   . Irregular heart beat Sister     1 sister with PPM.   History   Social History  . Marital Status: Married    Spouse Name: N/A    Number of Children: N/A  . Years of Education: N/A   Social History Main Topics  . Smoking status: Former Smoker -- 0.50 packs/day for 5 years    Types: Cigarettes    Quit date: 01/14/1980  . Smokeless tobacco: Never Used  . Alcohol Use: No  . Drug Use: No  . Sexual Activity:    Other Topics Concern  . None   Social History Narrative   Lives with Husband in [place].  Has a dtr in [place], [place] and a son in [place], [place].    Past Surgical History  Procedure Laterality Date  . Vesicovaginal fistula closure w/ tah  1964  . US echocardiography  04-26-09    EF 55-60%  . Cardiovascular stress test  05-13-2005    EF 67%  . Knee surgery    . Joint replacement  2000    left knee  . Abdominal hysterectomy  1964  . Toe shortened      right 2nd toe  . Cataract extraction      both eyes  . Mass excision  01/13/2012    Procedure: EXCISION MASS;  Surgeon: Axel Filler, MD;  Location: [facility];  Service: General;  Laterality: Right;  Excision of Right Back Mass   Past Medical History  Diagnosis Date  . Heart murmur     She has a known heart murmur of aortic valve stenosis  . Chronic back pain     She does have a hx of   . Aortic stenosis     a. 10/2010 Echo: EF 60-65%, mild LVH, no rwma, mild AS.  . Diabetes mellitus     adult onset   . Peripheral neuropathy   . GERD (gastroesophageal reflux disease)   . Hypertension   .  Pneumonia     hx of  . Bronchitis     hx of  . Cancer 2013    basal cell carcinoma   BP 142/74  Pulse 76  Resp 14  Ht 5\' 5"  (1.651 m)  Wt 205 lb 6.4 oz (93.169 kg)  BMI 34.18 kg/m2  SpO2 94%    Review of Systems  All other systems reviewed and are negative.       Objective:   Physical Exam  Nursing note and vitals reviewed. Constitutional: She is oriented to person, place, and time. She appears well-developed and well-nourished.  HENT:  Head: Normocephalic and atraumatic.  Eyes: Conjunctivae and EOM are normal. Pupils are equal, round, and reactive to light.  Musculoskeletal:       Lumbar back: She exhibits decreased range of motion and deformity. She exhibits no spasm.  Neurological: She is alert and oriented to person, place, and time. She has normal strength. She displays no atrophy. No sensory deficit. Gait abnormal.  Psychiatric: She has a normal mood and affect.    Motor strength is 5/5 in bilateral lower extremities. No tenderness to palpation the lumbar  paraspinal muscles Lumbar flexion is 75% of normal extension is 25% of normal Lateral rotation and bending are diminished       Assessment & Plan:  1. Left lumbar spondylosis causing back pain. Responds well to medial branch blocks greater than 50% relief on 2 occasions. Recommend radio frequency ablation. Will schedule and 3 weeks. If the patient responds well to this procedure we may be able to reduce pain medication use

## 2012-10-20 ENCOUNTER — Encounter: Payer: Self-pay | Admitting: *Deleted

## 2012-10-20 ENCOUNTER — Encounter: Payer: Medicare Other | Attending: Family Medicine | Admitting: *Deleted

## 2012-10-20 VITALS — Ht 63.0 in | Wt 205.0 lb

## 2012-10-20 DIAGNOSIS — E119 Type 2 diabetes mellitus without complications: Secondary | ICD-10-CM | POA: Insufficient documentation

## 2012-10-20 DIAGNOSIS — Z713 Dietary counseling and surveillance: Secondary | ICD-10-CM | POA: Insufficient documentation

## 2012-10-20 NOTE — Patient Instructions (Signed)
Plan:  Aim for 2 Carb Choices per meal (30 grams) +/- 1 either way  Aim for 0-1 Carbs per snack if hungry  Consider  increasing your activity level by looking into pool exercises or Arm Chair exercises daily as tolerated You now have a new lancing device that should help you to prick your finger.

## 2012-10-20 NOTE — Progress Notes (Signed)
Appt start time: 0900 end time:  1030.   Assessment:  Patient was seen on  10/20/12 for individual diabetes education. [place], she shops and prepares the meals. SMBG most days with reported range of 115-140 mg /dl. She has major back pain which limits her ability to be active.   Current HbA1c: 6.9%  MEDICATIONS: see list. diabetes medication is Metformin   DIETARY INTAKE:  Usual eating pattern includes 2-3 meals and 1-2 snacks per day.  Everyday foods include fair variety of all food groups in small portions.  Avoided foods include sweet drinks, most high calorie foods.    24-hr recall:  B ( AM):  Fresh fruit, water  Snk ( AM): occasionally some PNB crackers x 2  L ( PM): skips about 2-3 times a week OR PNB crackers and fresh fruit, water Snk ( PM): none D ( PM): starch, vegetables, beans or peas occasionally, sliced tomato, corn bread occasionally Snk ( PM): ice cream cone Beverages: water  Usual physical activity: minor house keeping  Estimated energy needs: 1200 calories 135 g carbohydrates 90 g protein 33 g fat  Progress Towards Goal(s):  In progress.   Nutritional Diagnosis:  NB-1.1 Food and nutrition-related knowledge deficit As related to diabetes control.  As evidenced by A1c of 6.9%.    Intervention:  Nutrition counseling provided.  Discussed diabetes disease process and treatment options.  Discussed physiology of diabetes and role of obesity on insulin resistance.  Encouraged moderate weight reduction to improve glucose levels.  Discussed role of medications and diet in glucose control  Provided education on macronutrients on glucose levels.  Provided education on carb counting, importance of regularly scheduled meals/snacks, and meal planning  Discussed effects of physical activity on glucose levels and long-term glucose control.  Recommended she consider pool or Arm Chair exercises due to her back problems.  Reviewed patient  medications.  Discussed role of medication on blood glucose   Discussed blood glucose monitoring and interpretation.  Discussed recommended target ranges and individual ranges.    Provided information on:  Short-term complications: hyper- and hypo-glycemia.  Discussed causes,symptoms, and treatment options.  Prevention, detection, and treatment of long-term complications.  Discussed the role of prolonged elevated glucose levels on body systems.  Role of stress on blood glucose levels and discussed strategies to manage psychosocial issues.  Recommendations for long-term diabetes self-care.  Established checklist for medical, dental, and emotional self-care.  Plan:  Aim for 2 Carb Choices per meal (30 grams) +/- 1 either way  Aim for 0-1 Carbs per snack if hungry  Consider  increasing your activity level by looking into pool exercises or Arm Chair exercises daily as tolerated You now have a new lancing device that should help you to prick your finger.  Handouts given during visit include: Living Well with Diabetes Carb Counting handout Meal Plan Card  Barriers to learning/adherance to lifestyle change: none stated, she is a very bright and reasonable woman  Diabetes self-care support plan:   [facility] support group available  Family is very supportive  Monitoring/Evaluation:  Dietary intake, exercise, SMBG, and body weight prn.

## 2012-11-01 ENCOUNTER — Other Ambulatory Visit: Payer: Self-pay

## 2012-11-01 ENCOUNTER — Telehealth: Payer: Self-pay

## 2012-11-01 MED ORDER — FENTANYL 50 MCG/HR TD PT72: 1.0000 | MEDICATED_PATCH | TRANSDERMAL | Status: DC

## 2012-11-01 NOTE — Telephone Encounter (Signed)
Refill for Fentanyl patches printed for Dr. Wynn Banker to sign.

## 2012-11-01 NOTE — Telephone Encounter (Signed)
Patient is requesting a refill on Fentanyl patches. She said she would only need 5 to last until her next appt. Patient has another appt in the area around 11:45am today and could stop by the clinic to pick up the RX if it was ready.

## 2012-11-01 NOTE — Telephone Encounter (Signed)
Patient has a appt on 11/4 for a RF. Can she pick up a RX for Fentanyl patches to last until her appt?

## 2012-11-01 NOTE — Telephone Encounter (Signed)
Ok to pick up rx for fentanyl

## 2012-11-01 NOTE — Telephone Encounter (Signed)
Patient had a appt

## 2012-11-16 ENCOUNTER — Encounter: Payer: Self-pay | Admitting: Physical Medicine & Rehabilitation

## 2012-11-16 ENCOUNTER — Ambulatory Visit (HOSPITAL_BASED_OUTPATIENT_CLINIC_OR_DEPARTMENT_OTHER): Payer: Medicare Other | Admitting: Physical Medicine & Rehabilitation

## 2012-11-16 ENCOUNTER — Encounter: Payer: Medicare Other | Attending: Physical Medicine & Rehabilitation

## 2012-11-16 VITALS — BP 131/42 | HR 74 | Resp 14 | Ht 65.0 in | Wt 205.0 lb

## 2012-11-16 DIAGNOSIS — E1149 Type 2 diabetes mellitus with other diabetic neurological complication: Secondary | ICD-10-CM | POA: Insufficient documentation

## 2012-11-16 DIAGNOSIS — E669 Obesity, unspecified: Secondary | ICD-10-CM | POA: Insufficient documentation

## 2012-11-16 DIAGNOSIS — M545 Low back pain, unspecified: Secondary | ICD-10-CM | POA: Insufficient documentation

## 2012-11-16 DIAGNOSIS — G8929 Other chronic pain: Secondary | ICD-10-CM | POA: Insufficient documentation

## 2012-11-16 DIAGNOSIS — E785 Hyperlipidemia, unspecified: Secondary | ICD-10-CM | POA: Insufficient documentation

## 2012-11-16 DIAGNOSIS — K219 Gastro-esophageal reflux disease without esophagitis: Secondary | ICD-10-CM | POA: Insufficient documentation

## 2012-11-16 DIAGNOSIS — M5137 Other intervertebral disc degeneration, lumbosacral region: Secondary | ICD-10-CM | POA: Insufficient documentation

## 2012-11-16 DIAGNOSIS — Z96659 Presence of unspecified artificial knee joint: Secondary | ICD-10-CM | POA: Insufficient documentation

## 2012-11-16 DIAGNOSIS — E1142 Type 2 diabetes mellitus with diabetic polyneuropathy: Secondary | ICD-10-CM | POA: Insufficient documentation

## 2012-11-16 DIAGNOSIS — M1990 Unspecified osteoarthritis, unspecified site: Secondary | ICD-10-CM | POA: Insufficient documentation

## 2012-11-16 DIAGNOSIS — M51379 Other intervertebral disc degeneration, lumbosacral region without mention of lumbar back pain or lower extremity pain: Secondary | ICD-10-CM | POA: Insufficient documentation

## 2012-11-16 DIAGNOSIS — I10 Essential (primary) hypertension: Secondary | ICD-10-CM | POA: Insufficient documentation

## 2012-11-16 DIAGNOSIS — M47817 Spondylosis without myelopathy or radiculopathy, lumbosacral region: Secondary | ICD-10-CM | POA: Insufficient documentation

## 2012-11-16 MED ORDER — FENTANYL 50 MCG/HR TD PT72: 50.0000 ug | MEDICATED_PATCH | TRANSDERMAL | Status: DC

## 2012-11-16 NOTE — Progress Notes (Signed)
Left L5 dorsal ramus., left L4 and left L3 medial branch radio frequency neurotomy under fluoroscopic guidance  Indication: Low back pain due to lumbar spondylosis which has been relieved on 2 occasions by greater than 50% by lumbar medial branch blocks at corresponding levels.  Informed consent was obtained after describing risks and benefits of the procedure with the patient, this includes bleeding, bruising, infection, paralysis and medication side effects. The patient wishes to proceed and has given written consent. The patient was placed in a prone position. The lumbar and sacral area was marked and prepped with Betadine. A 25-gauge 1-1/2 inch needle was inserted into the skin and subcutaneous tissue at 3 sites in one ML of 1% lidocaine was injected into each site. Then a 20-gauge 10cm cm radio frequency needle with a 1 cm curved active tip was inserted targeting the left S1 SAP/sacral ala junction. Bone contact was made and confirmed with lateral imaging. Sensory stimulation at 50 Hz followed by motor stimulation at 2 Hz confirm proper needle location followed by injection of one ML of the solution containing one ML of 4 mg per mL dexamethasone and 3 mL of 1% MPF lidocaine. Then the left L5 SAP/transverse process junction was targeted. Bone contact was made and confirmed with lateral imaging. Sensory stimulation at 50 Hz followed by motor stimulation at 2 Hz confirm proper needle location followed by injection of one ML of the solution containing one ML of 4 mg per mL dexamethasone and 3 mL of 1% MPF lidocaine. Then the left L4 SAP/transverse process junction was targeted. Bone contact was made and confirmed with lateral imaging. Sensory stimulation at 50 Hz followed by motor stimulation at 2 Hz confirm proper needle location followed by injection of one ML of the solution containing one ML of 4 mg per mL dexamethasone and 3 mL of 1% MPF lidocaine. Radio frequency lesion being at [facility] for 90 seconds was  performed. Needles were removed. Post procedure instructions and vital signs were performed. Patient tolerated procedure well. Followup appointment was given. Preinjection pain level 4/10 Post injection pain level 0/10

## 2012-11-16 NOTE — Progress Notes (Signed)
  PROCEDURE RECORD [facility]   Name: [name] [name] DOB:1930/07/25 MRN: [id number]  Date:11/16/2012  Physician: Claudette Laws, MD    Nurse/CMA: Levens,CMA/Walson, CMA/Shumaker, RN  Allergies:  Allergies  Allergen Reactions  . Codeine Nausea And Vomiting  . Demerol Other (See Comments)    Drunk feeling"  . Statins Other (See Comments)    "hurt"   . Zetia [Ezetimibe] Other (See Comments)    hurt    Consent Signed: yes  Is patient diabetic? yes  CBG today? 130 Pregnant: no LMP: No LMP recorded. Patient has had a hysterectomy. (age 64-55)  Anticoagulants: no Anti-inflammatory: no Antibiotics: finished cipro for UTI  Procedure: left Radiofrequency Neurotomy L3,4,5  Position: Prone Start Time: 1:30  End Time: 1:45  Fluoro Time: 24 seconds  RN/CMA Levens,CMA Shumaker, RN    Time 110 1:50    BP 131/42 155/48    Pulse 74 79    Respirations 14 14    O2 Sat 98 97    S/S 6 6    Pain Level 4 0/10     D/C home with Husband Willeen Cass, patient A & O X 3, D/C instructions reviewed, and sits independently.

## 2012-11-30 ENCOUNTER — Telehealth: Payer: Self-pay | Admitting: Cardiology

## 2012-11-30 NOTE — Telephone Encounter (Signed)
error 

## 2012-12-06 ENCOUNTER — Encounter: Payer: Medicare Other | Attending: Physical Medicine & Rehabilitation

## 2012-12-06 ENCOUNTER — Ambulatory Visit (HOSPITAL_BASED_OUTPATIENT_CLINIC_OR_DEPARTMENT_OTHER): Payer: Medicare Other | Admitting: Physical Medicine & Rehabilitation

## 2012-12-06 ENCOUNTER — Encounter: Payer: Self-pay | Admitting: Physical Medicine & Rehabilitation

## 2012-12-06 VITALS — BP 153/56 | HR 58 | Resp 14 | Ht 65.0 in | Wt 207.4 lb

## 2012-12-06 DIAGNOSIS — M5432 Sciatica, left side: Secondary | ICD-10-CM

## 2012-12-06 DIAGNOSIS — M47817 Spondylosis without myelopathy or radiculopathy, lumbosacral region: Secondary | ICD-10-CM | POA: Insufficient documentation

## 2012-12-06 DIAGNOSIS — IMO0001 Reserved for inherently not codable concepts without codable children: Secondary | ICD-10-CM | POA: Insufficient documentation

## 2012-12-06 DIAGNOSIS — M545 Low back pain, unspecified: Secondary | ICD-10-CM | POA: Insufficient documentation

## 2012-12-06 DIAGNOSIS — M5430 Sciatica, unspecified side: Secondary | ICD-10-CM

## 2012-12-06 MED ORDER — HYDROCODONE-ACETAMINOPHEN 5-325 MG PO TABS: 1.0000 | ORAL_TABLET | Freq: Three times a day (TID) | ORAL | Status: DC

## 2012-12-06 NOTE — Progress Notes (Signed)
Subjective:    Patient ID: [name] [name], female    DOB: 1930/04/16, 79 y.o.   MRN: [id number]  HPI  Patient doing well after radiofrequency neurotomy left L3 L4-L5 on 11/16/2012. Unfortunately 2 weeks ago she fell while garden being and now has left hip and left sciatic type pain. His taking hydrocodone twice in the last 2 days which has been helpful for her.  Doesn't feel that Noah's helping her. She The last patch on for 1 week and then changed it did not note any difference in her pain scores. No withdrawal reactions. Pain Inventory Average Pain 8 Pain Right Now 8 My pain is stabbing  In the last 24 hours, has pain interfered with the following? General activity 8 Relation with others 8 Enjoyment of life 8 What TIME of day is your pain at its worst? daytime Sleep (in general) Poor  Pain is worse with: walking and standing Pain improves with: medication Relief from Meds: 7  Mobility walk with assistance use a cane ability to climb steps?  no do you drive?  yes transfers alone Do you have any goals in this area?  no  Function retired  Neuro/Psych No problems in this area  Prior Studies Any changes since last visit?  no  Physicians involved in your care Any changes since last visit?  no   Family History  Problem Relation Age of Onset  . Other Mother     died @ 39, complications following childbirth  . COPD Father     died @ 56  . Heart failure Father   . Irregular heart beat Sister     1 sister with PPM.   History   Social History  . Marital Status: Married    Spouse Name: N/A    Number of Children: N/A  . Years of Education: N/A   Social History Main Topics  . Smoking status: Former Smoker -- 0.50 packs/day for 5 years    Types: Cigarettes    Quit date: 01/14/1980  . Smokeless tobacco: Never Used  . Alcohol Use: No  . Drug Use: No  . Sexual Activity:    Other Topics Concern  . None   Social History Narrative   Lives with Husband in  [place].  Has a dtr in [place], [place] and a son in [place], [place].   Past Surgical History  Procedure Laterality Date  . Vesicovaginal fistula closure w/ tah  1964  . US echocardiography  04-26-09    EF 55-60%  . Cardiovascular stress test  05-13-2005    EF 67%  . Knee surgery    . Joint replacement  2000    left knee  . Abdominal hysterectomy  1964  . Toe shortened      right 2nd toe  . Cataract extraction      both eyes  . Mass excision  01/13/2012    Procedure: EXCISION MASS;  Surgeon: Axel Filler, MD;  Location: [facility];  Service: General;  Laterality: Right;  Excision of Right Back Mass   Past Medical History  Diagnosis Date  . Heart murmur     She has a known heart murmur of aortic valve stenosis  . Chronic back pain     She does have a hx of   . Aortic stenosis     a. 10/2010 Echo: EF 60-65%, mild LVH, no rwma, mild AS.  . Diabetes mellitus     adult onset   . Peripheral neuropathy   . GERD (  gastroesophageal reflux disease)   . Hypertension   . Pneumonia     hx of  . Bronchitis     hx of  . Cancer 2013    basal cell carcinoma   BP 153/56  Pulse 58  Resp 14  Ht 5\' 5"  (1.651 m)  Wt 207 lb 6.4 oz (94.076 kg)  BMI 34.51 kg/m2  SpO2 96%      Review of Systems  Musculoskeletal: Positive for back pain.       Objective:   Physical Exam  Nursing note and vitals reviewed. Constitutional: She is oriented to person, place, and time. She appears well-developed.  obese  HENT:  Head: Normocephalic and atraumatic.  Eyes: Conjunctivae and EOM are normal. Pupils are equal, round, and reactive to light.  Musculoskeletal:       Right hip: Normal.       Left hip: Normal.       Lumbar back: She exhibits decreased range of motion. She exhibits no tenderness, no bony tenderness, no swelling, no deformity and no spasm.  Neurological: She is alert and oriented to person, place, and time. She has normal strength. A sensory deficit is present. She exhibits normal  muscle tone. Gait abnormal.  Reflex Scores:      Patellar reflexes are 1+ on the right side and 1+ on the left side.      Achilles reflexes are 0 on the right side and 0 on the left side. Stocking distribution numbness in the feet  Ambulates with a straight cane, no evidence of toe drag or instability  Psychiatric: She has a normal mood and affect.          Assessment & Plan:  1. Lumbar spondylosis with left sided low back pain improved after left  lumbar medial branch radio frequency neurotomy on 11/16/2012.  2. Left Buttocks pain appears to be Sciatic, no evidence of hip joint pathology. I do not think any x-rays are needed. I do think this should improve with time. I did review CT of the abdomen pelvis from 2012 which did demonstrate some central stenosis at the lower lumbar levels.  Will medicate with hydrocodone 5 mg 3 times a day. Patient does not get much benefit from fentanyl patch even when she didn't change it for  1 week she didn't notice any difference compared to when she restarted it.  If not much better in one month, will be left S1 transforaminal epidural injection

## 2012-12-06 NOTE — Patient Instructions (Signed)
Call if your pain as worsening residence slowly getting better. will stop the fentanyl patch, I would just leave it on for 3 more days and then discontinue Take hydrocodone 5 mg 3 times per day as needed If no better next visit may need to do epidural injection, left S1

## 2012-12-17 ENCOUNTER — Encounter: Payer: Self-pay | Admitting: Physical Medicine & Rehabilitation

## 2012-12-17 ENCOUNTER — Encounter: Payer: Medicare Other | Attending: Physical Medicine & Rehabilitation

## 2012-12-17 ENCOUNTER — Ambulatory Visit (HOSPITAL_BASED_OUTPATIENT_CLINIC_OR_DEPARTMENT_OTHER): Payer: Medicare Other | Admitting: Physical Medicine & Rehabilitation

## 2012-12-17 VITALS — BP 139/47 | HR 74 | Resp 14 | Ht 65.0 in | Wt 204.0 lb

## 2012-12-17 DIAGNOSIS — Z79899 Other long term (current) drug therapy: Secondary | ICD-10-CM

## 2012-12-17 DIAGNOSIS — E1149 Type 2 diabetes mellitus with other diabetic neurological complication: Secondary | ICD-10-CM | POA: Insufficient documentation

## 2012-12-17 DIAGNOSIS — E669 Obesity, unspecified: Secondary | ICD-10-CM | POA: Insufficient documentation

## 2012-12-17 DIAGNOSIS — M545 Low back pain, unspecified: Secondary | ICD-10-CM | POA: Insufficient documentation

## 2012-12-17 DIAGNOSIS — E785 Hyperlipidemia, unspecified: Secondary | ICD-10-CM | POA: Insufficient documentation

## 2012-12-17 DIAGNOSIS — E1142 Type 2 diabetes mellitus with diabetic polyneuropathy: Secondary | ICD-10-CM | POA: Insufficient documentation

## 2012-12-17 DIAGNOSIS — Z96659 Presence of unspecified artificial knee joint: Secondary | ICD-10-CM | POA: Insufficient documentation

## 2012-12-17 DIAGNOSIS — Z5181 Encounter for therapeutic drug level monitoring: Secondary | ICD-10-CM

## 2012-12-17 DIAGNOSIS — M47817 Spondylosis without myelopathy or radiculopathy, lumbosacral region: Secondary | ICD-10-CM

## 2012-12-17 DIAGNOSIS — K219 Gastro-esophageal reflux disease without esophagitis: Secondary | ICD-10-CM | POA: Insufficient documentation

## 2012-12-17 DIAGNOSIS — M51379 Other intervertebral disc degeneration, lumbosacral region without mention of lumbar back pain or lower extremity pain: Secondary | ICD-10-CM | POA: Insufficient documentation

## 2012-12-17 DIAGNOSIS — G8929 Other chronic pain: Secondary | ICD-10-CM | POA: Insufficient documentation

## 2012-12-17 DIAGNOSIS — M5430 Sciatica, unspecified side: Secondary | ICD-10-CM

## 2012-12-17 DIAGNOSIS — I10 Essential (primary) hypertension: Secondary | ICD-10-CM | POA: Insufficient documentation

## 2012-12-17 DIAGNOSIS — M1990 Unspecified osteoarthritis, unspecified site: Secondary | ICD-10-CM | POA: Insufficient documentation

## 2012-12-17 DIAGNOSIS — M5137 Other intervertebral disc degeneration, lumbosacral region: Secondary | ICD-10-CM | POA: Insufficient documentation

## 2012-12-17 MED ORDER — HYDROCODONE-ACETAMINOPHEN 5-325 MG PO TABS: 1.0000 | ORAL_TABLET | Freq: Three times a day (TID) | ORAL | Status: DC

## 2012-12-17 NOTE — Progress Notes (Signed)
Subjective:    Patient ID: [name] [name], female    DOB: June 23, 1930, 79 y.o.   MRN: [id number]  HPI Off Fentanyl patch! No increase in pain. Back pain still well-controlled following radiofrequency procedure nowLeft buttocks sciatic pain worse over Thanksgiving but now improved. Was taking 3 hydrocodone a day but now is down to 2 hydrocodone per day. Still continues to take gabapentin 900 mg 3 times per day. Has increased sciatic pain if she misses a dose Pain Inventory Average Pain 7 Pain Right Now 7 My pain is stabbing  In the last 24 hours, has pain interfered with the following? General activity 4 Relation with others 4 Enjoyment of life 4 What TIME of day is your pain at its worst? n/a Sleep (in general) Fair  Pain is worse with: walking, bending and standing Pain improves with: medication Relief from Meds: 9  Mobility use a cane how many minutes can you walk? 10 do you drive?  yes  Function retired  Neuro/Psych trouble walking  Prior Studies Any changes since last visit?  no  Physicians involved in your care Any changes since last visit?  no   Family History  Problem Relation Age of Onset  . Other Mother     died @ 29, complications following childbirth  . COPD Father     died @ 63  . Heart failure Father   . Irregular heart beat Sister     1 sister with PPM.   History   Social History  . Marital Status: Married    Spouse Name: N/A    Number of Children: N/A  . Years of Education: N/A   Social History Main Topics  . Smoking status: Former Smoker -- 0.50 packs/day for 5 years    Types: Cigarettes    Quit date: 01/14/1980  . Smokeless tobacco: Never Used  . Alcohol Use: No  . Drug Use: No  . Sexual Activity:    Other Topics Concern  . None   Social History Narrative   Lives with Husband in [place].  Has a dtr in [place], [place] and a son in [place], [place].   Past Surgical History  Procedure Laterality Date  . Vesicovaginal fistula  closure w/ tah  1964  . US echocardiography  04-26-09    EF 55-60%  . Cardiovascular stress test  05-13-2005    EF 67%  . Knee surgery    . Joint replacement  2000    left knee  . Abdominal hysterectomy  1964  . Toe shortened      right 2nd toe  . Cataract extraction      both eyes  . Mass excision  01/13/2012    Procedure: EXCISION MASS;  Surgeon: Axel Filler, MD;  Location: [facility];  Service: General;  Laterality: Right;  Excision of Right Back Mass   Past Medical History  Diagnosis Date  . Heart murmur     She has a known heart murmur of aortic valve stenosis  . Chronic back pain     She does have a hx of   . Aortic stenosis     a. 10/2010 Echo: EF 60-65%, mild LVH, no rwma, mild AS.  . Diabetes mellitus     adult onset   . Peripheral neuropathy   . GERD (gastroesophageal reflux disease)   . Hypertension   . Pneumonia     hx of  . Bronchitis     hx of  . Cancer 2013  basal cell carcinoma   BP 139/47  Pulse 74  Resp 14  Ht 5\' 5"  (1.651 m)  Wt 204 lb (92.534 kg)  BMI 33.95 kg/m2  SpO2 94%     Review of Systems  Musculoskeletal: Positive for back pain and gait problem.  All other systems reviewed and are negative.       Objective:   Physical Exam  Nursing note and vitals reviewed. Constitutional: She is oriented to person, place, and time. She appears well-developed and well-nourished.  HENT:  Head: Normocephalic and atraumatic.  Neurological: She is alert and oriented to person, place, and time. She has normal strength. No sensory deficit. Gait abnormal.  Reflex Scores:      Patellar reflexes are 1+ on the right side and 1+ on the left side.      Achilles reflexes are 1+ on the right side and 1+ on the left side. Negative straight leg raising test  Ambulates with a cane stenotic posture  Psychiatric: She has a normal mood and affect.          Assessment & Plan:  1. Lumbar spondylosis with left sided low back pain improved after left lumbar  medial branch radio frequency neurotomy on 11/16/2012.  2. Left Buttocks pain appears to be Sciatic, no evidence of hip joint pathology. I did review CT of the abdomen pelvis from 2012 which did demonstrate some central stenosis at the lower lumbar levels, which may be why this is persisting. Patient states she cannot tolerate coming off of gabapentin  Would recommend MRI lumbar spine prior to any type of epidural injection Will medicate with hydrocodone 5 mg 3 times a day.  Continue gabapentin 900 mg 3 times per day  Review of MRI, probable lumbosacral epidural

## 2012-12-17 NOTE — Patient Instructions (Signed)
MRI prior to epidural

## 2013-01-11 ENCOUNTER — Ambulatory Visit
Admission: RE | Admit: 2013-01-11 | Discharge: 2013-01-11 | Disposition: A | Discharge status: Home / Self Care | Payer: Medicare Other | Source: Ambulatory Visit | Attending: Physical Medicine & Rehabilitation | Admitting: Physical Medicine & Rehabilitation

## 2013-01-11 DIAGNOSIS — M5430 Sciatica, unspecified side: Secondary | ICD-10-CM

## 2013-01-12 ENCOUNTER — Other Ambulatory Visit: Payer: Self-pay

## 2013-01-12 MED ORDER — MULTIVITAMINS PO CAPS: 1.0000 | ORAL_CAPSULE | Freq: Two times a day (BID) | ORAL | Status: DC

## 2013-01-12 MED ORDER — NONFORMULARY OR COMPOUNDED ITEM: Status: DC

## 2013-01-21 ENCOUNTER — Telehealth: Payer: Self-pay | Admitting: *Deleted

## 2013-01-21 NOTE — Telephone Encounter (Signed)
See Dr Letta Pate note to set up for injection

## 2013-01-21 NOTE — Telephone Encounter (Signed)
Message copied by Caro Hight on Fri Jan 21, 2013  4:01 PM ------      Message from: Charlett Blake      Created: Mon Jan 17, 2013  6:41 AM       Set up for Left L2-3 transforaminal ESI ------

## 2013-01-25 ENCOUNTER — Encounter: Payer: Self-pay | Admitting: Cardiology

## 2013-01-25 ENCOUNTER — Ambulatory Visit (INDEPENDENT_AMBULATORY_CARE_PROVIDER_SITE_OTHER): Payer: Medicare HMO | Admitting: Cardiology

## 2013-01-25 ENCOUNTER — Encounter (INDEPENDENT_AMBULATORY_CARE_PROVIDER_SITE_OTHER): Payer: Self-pay

## 2013-01-25 VITALS — BP 126/64 | HR 96 | Ht 65.0 in | Wt 205.0 lb

## 2013-01-25 DIAGNOSIS — R05 Cough: Secondary | ICD-10-CM

## 2013-01-25 DIAGNOSIS — I350 Nonrheumatic aortic (valve) stenosis: Secondary | ICD-10-CM

## 2013-01-25 DIAGNOSIS — R059 Cough, unspecified: Secondary | ICD-10-CM | POA: Insufficient documentation

## 2013-01-25 DIAGNOSIS — I359 Nonrheumatic aortic valve disorder, unspecified: Secondary | ICD-10-CM

## 2013-01-25 DIAGNOSIS — I119 Hypertensive heart disease without heart failure: Secondary | ICD-10-CM

## 2013-01-25 DIAGNOSIS — I491 Atrial premature depolarization: Secondary | ICD-10-CM

## 2013-01-25 NOTE — Assessment & Plan Note (Signed)
The patient continues to have frequent runs of PACs.  She is asymptomatic.

## 2013-01-25 NOTE — Assessment & Plan Note (Signed)
The patient is not having any symptoms from her mild aortic stenosis.

## 2013-01-25 NOTE — Progress Notes (Signed)
[name] [name] Date of Birth:  21-May-1930 [address] [place], [place]  [address] [phone]  Fax   [phone]  HPI: This pleasant 80 year old woman is seen back for a one-year followup office visit.  She has a past history of mild aortic stenosis.  Her last echocardiogram was 10/18/10 and showed an ejection fraction of 60-65% with mild aortic stenosis with peak gradient of 22 and mean gradient of 11. The patient has a past history of premature atrial beats.  She recently was seen in the recovery room following surgery at [facility] where her not to was initially felt to show new atrial fibrillation but on careful review she was having sinus rhythm with frequent premature atrial contractions.  These are asymptomatic.  The patient has not been experiencing any chest pain or shortness of breath  Current Outpatient Prescriptions  Medication Sig Dispense Refill  . aspirin 81 MG tablet Take 81 mg by mouth every Monday, Wednesday, and Friday. Take 1 on Monday, Wednesday, and Friday only      . Biotin (BIOTIN 5000) 5 MG CAPS Take 5,000 mg by mouth at bedtime.       . Blood Glucose Monitoring Suppl (ONE TOUCH ULTRA SYSTEM KIT) W/DEVICE KIT 1 kit by Does not apply route once.      . Calcium Carbonate-Vitamin D (CALCIUM + D PO) Take 1 tablet by mouth 2 (two) times daily.       Marland Kitchen CRANBERRY FRUIT PO Take 1 tablet by mouth every morning.       . gabapentin (NEURONTIN) 300 MG capsule Take 900 mg by mouth 3 (three) times daily.       Marland Kitchen HYDROcodone-acetaminophen (NORCO/VICODIN) 5-325 MG per tablet Take 1 tablet by mouth 3 (three) times daily.  90 tablet  0  . Lancets MISC by Does not apply route.      Marland Kitchen lisinopril-hydrochlorothiazide (PRINZIDE,ZESTORETIC) 20-25 MG per tablet Take 0.5 tablets by mouth daily.       . metFORMIN (GLUCOPHAGE) 1000 MG tablet Take 1,000 mg by mouth 2 (two) times daily with a meal.      . metoprolol (TOPROL-XL) 50 MG 24 hr tablet Take 50 mg by mouth at  bedtime.       . Multiple Vitamin (MULTIVITAMIN) capsule Take 1 capsule by mouth 2 (two) times daily.  60 capsule  5  . NONFORMULARY OR COMPOUNDED ITEM A-15, diclofenac sodium #5, baclofen 2%, cyclobenzaprine hcl 2%, gabapentin 10%, bupivacaine hcl monohydrate 5%  240 each  5  . oxybutynin (DITROPAN-XL) 10 MG 24 hr tablet Take 1 tablet by mouth daily.      . pantoprazole (PROTONIX) 40 MG tablet Take 40 mg by mouth every morning.       . Rosuvastatin Calcium (CRESTOR PO) Take 5 mg by mouth at bedtime.        No current facility-administered medications for this visit.    Allergies  Allergen Reactions  . Codeine Nausea And Vomiting  . Demerol Other (See Comments)    Drunk feeling"  . Statins Other (See Comments)    "hurt"   . Zetia [Ezetimibe] Other (See Comments)    hurt    Patient Active Problem List   Diagnosis Date Noted  . Cough 01/25/2013  . Lumbosacral spondylosis without myelopathy 05/07/2012  . Disorders of sacrum 05/07/2012  . Degeneration of lumbar or lumbosacral intervertebral disc 05/07/2012  . Diabetic peripheral neuropathy associated with type 2 diabetes mellitus 05/07/2012  . PAC (premature  atrial contraction) 01/21/2012  . Chronic or unspecified duodenal ulcer with hemorrhage, without mention of obstruction 11/12/2010  . Pure hypercholesterolemia 11/12/2010  . Benign hypertensive heart disease without heart failure 11/12/2010  . Diabetes mellitus without mention of complication 84/16/6063  . Aortic stenosis, mild 11/12/2010    History  Smoking status  . Former Smoker -- 0.50 packs/day for 5 years  . Types: Cigarettes  . Quit date: 01/14/1980  Smokeless tobacco  . Never Used    History  Alcohol Use No    Family History  Problem Relation Age of Onset  . Other Mother     died @ 4, complications following childbirth  . COPD Father     died @ 67  . Heart failure Father   . Irregular heart beat Sister     1 sister with PPM.    Review of  Systems: The patient denies any heat or cold intolerance.  No weight gain or weight loss.  The patient denies headaches or blurry vision.  There is no cough or sputum production.  The patient denies dizziness.  There is no hematuria or hematochezia.  The patient denies any muscle aches or arthritis.  The patient denies any rash.  The patient denies frequent falling or instability.  There is no history of depression or anxiety.  All other systems were reviewed and are negative.   Physical Exam: Filed Vitals:   01/25/13 1336  BP: 126/64  Pulse: 96   the general appearance reveals a well-developed well-nourished woman in no distress.The head and neck exam reveals pupils equal and reactive.  Extraocular movements are full.  There is no scleral icterus.  The mouth and pharynx are normal.  The neck is supple.  The carotids reveal no bruits.  The jugular venous pressure is normal.  The  thyroid is not enlarged.  There is no lymphadenopathy.  The chest is clear to percussion and auscultation.  There are no rales or rhonchi.  Expansion of the chest is symmetrical.  The precordium is quiet.  The pulse is irregular because of frequent PACs  The first heart sound is normal.  The second heart sound is physiologically split.  There is no murmur gallop rub or click.  There is no abnormal lift or heave.  The abdomen is soft and nontender.  The bowel sounds are normal.  The liver and spleen are not enlarged.  There are no abdominal masses.  There are no abdominal bruits.  Extremities reveal good pedal pulses.  There is no phlebitis or edema.  There is no cyanosis or clubbing.  Strength is normal and symmetrical in all extremities.  There is no lateralizing weakness.  There are no sensory deficits.  The skin is warm and dry.  There is no rash.   EKG confirms sinus tachycardia and frequent PACs and in no acute ischemic changes.   Assessment / Plan: Continue same medication.  She has had a dry tickling cough which may  be related to lisinopril.  She will discuss this further with her PCP Recheck here in one year for followup office visit and EKG

## 2013-01-25 NOTE — Assessment & Plan Note (Signed)
The patient is not having any headaches dizziness or syncope

## 2013-01-25 NOTE — Patient Instructions (Signed)
Your physician recommends that you continue on your current medications as directed. Please refer to the Current Medication list given to you today.  Your physician wants you to follow-up in: 1 year. You will receive a reminder letter in the mail two months in advance. If you don't receive a letter, please call our office to schedule the follow-up appointment.  

## 2013-02-01 ENCOUNTER — Encounter: Payer: Self-pay | Admitting: Physical Medicine & Rehabilitation

## 2013-02-01 ENCOUNTER — Encounter: Payer: Medicare HMO | Attending: Physical Medicine & Rehabilitation

## 2013-02-01 ENCOUNTER — Ambulatory Visit (HOSPITAL_BASED_OUTPATIENT_CLINIC_OR_DEPARTMENT_OTHER): Payer: Medicare HMO | Admitting: Physical Medicine & Rehabilitation

## 2013-02-01 VITALS — BP 140/80 | HR 74 | Resp 14 | Ht 65.0 in | Wt 205.0 lb

## 2013-02-01 DIAGNOSIS — G8929 Other chronic pain: Secondary | ICD-10-CM | POA: Insufficient documentation

## 2013-02-01 DIAGNOSIS — I10 Essential (primary) hypertension: Secondary | ICD-10-CM | POA: Insufficient documentation

## 2013-02-01 DIAGNOSIS — M51379 Other intervertebral disc degeneration, lumbosacral region without mention of lumbar back pain or lower extremity pain: Secondary | ICD-10-CM | POA: Insufficient documentation

## 2013-02-01 DIAGNOSIS — E785 Hyperlipidemia, unspecified: Secondary | ICD-10-CM | POA: Insufficient documentation

## 2013-02-01 DIAGNOSIS — M48061 Spinal stenosis, lumbar region without neurogenic claudication: Secondary | ICD-10-CM

## 2013-02-01 DIAGNOSIS — M545 Low back pain, unspecified: Secondary | ICD-10-CM | POA: Insufficient documentation

## 2013-02-01 DIAGNOSIS — M5137 Other intervertebral disc degeneration, lumbosacral region: Secondary | ICD-10-CM | POA: Insufficient documentation

## 2013-02-01 DIAGNOSIS — E1142 Type 2 diabetes mellitus with diabetic polyneuropathy: Secondary | ICD-10-CM | POA: Insufficient documentation

## 2013-02-01 DIAGNOSIS — M47817 Spondylosis without myelopathy or radiculopathy, lumbosacral region: Secondary | ICD-10-CM | POA: Insufficient documentation

## 2013-02-01 DIAGNOSIS — E1149 Type 2 diabetes mellitus with other diabetic neurological complication: Secondary | ICD-10-CM | POA: Insufficient documentation

## 2013-02-01 DIAGNOSIS — E669 Obesity, unspecified: Secondary | ICD-10-CM | POA: Insufficient documentation

## 2013-02-01 DIAGNOSIS — Z96659 Presence of unspecified artificial knee joint: Secondary | ICD-10-CM | POA: Insufficient documentation

## 2013-02-01 DIAGNOSIS — K219 Gastro-esophageal reflux disease without esophagitis: Secondary | ICD-10-CM | POA: Insufficient documentation

## 2013-02-01 DIAGNOSIS — M1990 Unspecified osteoarthritis, unspecified site: Secondary | ICD-10-CM | POA: Insufficient documentation

## 2013-02-01 NOTE — Progress Notes (Signed)
Subjective:    Patient ID: [name] [name], female    DOB: June 27, 1930, 80 y.o.   MRN: [id number] Off Fentanyl patch and hydrocodone!  No increase in pain. Back pain still well-controlled following radiofrequency procedure now  Left buttocks sciatic pain worse over Thanksgiving but now improved.no longer taking hydrocodone, has 85/90 tabs left. Still continues to take gabapentin 900 mg 2-3 times per day. Has increased left hip pain if she misses dose  HPI Pain much better  Reviewed MRI Pain Inventory Average Pain 3 Pain Right Now 3 My pain is aching  In the last 24 hours, has pain interfered with the following? General activity 2 Relation with others 2 Enjoyment of life 2 What TIME of day is your pain at its worst? evening Sleep (in general) Fair  Pain is worse with: walking and standing Pain improves with: medication Relief from Meds: 2  Mobility use a cane how many minutes can you walk? 10-15 do you drive?  yes  Function retired  Neuro/Psych trouble walking dizziness  Prior Studies Any changes since last visit?  no  Physicians involved in your care Any changes since last visit?  no   Family History  Problem Relation Age of Onset  . Other Mother     died @ 57, complications following childbirth  . COPD Father     died @ 44  . Heart failure Father   . Irregular heart beat Sister     1 sister with PPM.   History   Social History  . Marital Status: Married    Spouse Name: N/A    Number of Children: N/A  . Years of Education: N/A   Social History Main Topics  . Smoking status: Former Smoker -- 0.50 packs/day for 5 years    Types: Cigarettes    Quit date: 01/14/1980  . Smokeless tobacco: Never Used  . Alcohol Use: No  . Drug Use: No  . Sexual Activity:    Other Topics Concern  . None   Social History Narrative   Lives with Husband in [place].  Has a dtr in [place], [place] and a son in [place], [place].   Past Surgical History  Procedure Laterality  Date  . Vesicovaginal fistula closure w/ tah  1964  . US echocardiography  04-26-09    EF 55-60%  . Cardiovascular stress test  05-13-2005    EF 67%  . Knee surgery    . Joint replacement  2000    left knee  . Abdominal hysterectomy  1964  . Toe shortened      right 2nd toe  . Cataract extraction      both eyes  . Mass excision  01/13/2012    Procedure: EXCISION MASS;  Surgeon: Ralene Ok, MD;  Location: [facility];  Service: General;  Laterality: Right;  Excision of Right Back Mass   Past Medical History  Diagnosis Date  . Heart murmur     She has a known heart murmur of aortic valve stenosis  . Chronic back pain     She does have a hx of   . Aortic stenosis     a. 10/2010 Echo: EF 60-65%, mild LVH, no rwma, mild AS.  . Diabetes mellitus     adult onset   . Peripheral neuropathy   . GERD (gastroesophageal reflux disease)   . Hypertension   . Pneumonia     hx of  . Bronchitis     hx of  . Cancer 2013  basal cell carcinoma   BP 140/80  Pulse 74  Resp 14  Ht 5\' 5"  (1.651 m)  Wt 205 lb (92.987 kg)  BMI 34.11 kg/m2  SpO2 96%    Review of Systems  Genitourinary: Positive for difficulty urinating.  Musculoskeletal: Positive for gait problem.  Neurological: Positive for dizziness.  All other systems reviewed and are negative.       Objective:   Physical Exam  Nursing note and vitals reviewed. Constitutional: She is oriented to person, place, and time. She appears well-developed and well-nourished.  HENT:  Head: Normocephalic and atraumatic.  Eyes: Conjunctivae and EOM are normal. Pupils are equal, round, and reactive to light.  Neurological: She is alert and oriented to person, place, and time.  Psychiatric: She has a normal mood and affect.   R. spine no pain to palpation good flexion Reduced lumbar extension and gets to neutral only. Ambulates in a forward flexed position with decreased balance Normal strength in the lower Sensation reduced below the  knees in both lowers       Assessment & Plan:  1. Lumbar spondylosis with left sided low back pain improved after left lumbar medial branch radio frequency neurotomy on 11/16/2012.  2. Left Buttocks pain appears to be Sciatic, no evidence of hip joint pathology. I did review CT of the abdomen pelvis from 2012 which did demonstrate some central stenosis at the lower lumbar levels, which may be why this is persisting. Patient states she cannot tolerate coming off of gabapentin   MRI lumbar spine demonstrates L2-L3 spinal stenosis which would correlate with hip pain complaints. Fortunately these pains have improved.Consider ESI if flares up again Patient still has 85 hydrocodone tablets. She takes these very sparingly. No new prescription needed.  Continue gabapentin 900 mg 2-3 times per day

## 2013-02-01 NOTE — Patient Instructions (Signed)
Call sooner for appt if pain flares again

## 2013-04-26 ENCOUNTER — Encounter: Payer: Medicare HMO | Attending: Physical Medicine & Rehabilitation

## 2013-04-26 ENCOUNTER — Ambulatory Visit (HOSPITAL_BASED_OUTPATIENT_CLINIC_OR_DEPARTMENT_OTHER): Payer: Medicare HMO | Admitting: Physical Medicine & Rehabilitation

## 2013-04-26 ENCOUNTER — Encounter: Payer: Self-pay | Admitting: Physical Medicine & Rehabilitation

## 2013-04-26 VITALS — BP 134/70 | HR 85 | Resp 14 | Ht 63.0 in | Wt 213.4 lb

## 2013-04-26 DIAGNOSIS — M545 Low back pain, unspecified: Secondary | ICD-10-CM | POA: Insufficient documentation

## 2013-04-26 DIAGNOSIS — K219 Gastro-esophageal reflux disease without esophagitis: Secondary | ICD-10-CM | POA: Insufficient documentation

## 2013-04-26 DIAGNOSIS — Z96659 Presence of unspecified artificial knee joint: Secondary | ICD-10-CM | POA: Insufficient documentation

## 2013-04-26 DIAGNOSIS — M51379 Other intervertebral disc degeneration, lumbosacral region without mention of lumbar back pain or lower extremity pain: Secondary | ICD-10-CM

## 2013-04-26 DIAGNOSIS — E669 Obesity, unspecified: Secondary | ICD-10-CM | POA: Insufficient documentation

## 2013-04-26 DIAGNOSIS — M47817 Spondylosis without myelopathy or radiculopathy, lumbosacral region: Secondary | ICD-10-CM | POA: Insufficient documentation

## 2013-04-26 DIAGNOSIS — M5137 Other intervertebral disc degeneration, lumbosacral region: Secondary | ICD-10-CM | POA: Insufficient documentation

## 2013-04-26 DIAGNOSIS — E785 Hyperlipidemia, unspecified: Secondary | ICD-10-CM | POA: Insufficient documentation

## 2013-04-26 DIAGNOSIS — E1142 Type 2 diabetes mellitus with diabetic polyneuropathy: Secondary | ICD-10-CM | POA: Insufficient documentation

## 2013-04-26 DIAGNOSIS — E1149 Type 2 diabetes mellitus with other diabetic neurological complication: Secondary | ICD-10-CM | POA: Insufficient documentation

## 2013-04-26 DIAGNOSIS — M533 Sacrococcygeal disorders, not elsewhere classified: Secondary | ICD-10-CM

## 2013-04-26 DIAGNOSIS — I10 Essential (primary) hypertension: Secondary | ICD-10-CM | POA: Insufficient documentation

## 2013-04-26 DIAGNOSIS — G8929 Other chronic pain: Secondary | ICD-10-CM | POA: Insufficient documentation

## 2013-04-26 DIAGNOSIS — M1990 Unspecified osteoarthritis, unspecified site: Secondary | ICD-10-CM | POA: Insufficient documentation

## 2013-04-26 NOTE — Patient Instructions (Signed)
Call if left hip pain worsens again

## 2013-04-26 NOTE — Progress Notes (Signed)
Subjective:    Patient ID: [name] [name], female    DOB: 1930/11/12, 80 y.o.   MRN: [id number]  HPI Mild flareup of back pain since doing yard work for 5 hours yesterday. Has only used small amounts of hydrocodone. Continues on gabapentin 900 mg 3 times per day. No new medical problems   Pain Inventory Average Pain 7 Pain Right Now 7 My pain is aching  In the last 24 hours, has pain interfered with the following? General activity 7 Relation with others 7 Enjoyment of life 7 What TIME of day is your pain at its worst? daytime and evening Sleep (in general) Fair  Pain is worse with: walking, bending and standing Pain improves with: rest and medication Relief from Meds: 7  Mobility use a cane ability to climb steps?  no do you drive?  yes  Function retired  Neuro/Psych bladder control problems trouble walking  Prior Studies Any changes since last visit?  no  Physicians involved in your care Any changes since last visit?  no   Family History  Problem Relation Age of Onset  . Other Mother     died @ 39, complications following childbirth  . COPD Father     died @ 56  . Heart failure Father   . Irregular heart beat Sister     1 sister with PPM.   History   Social History  . Marital Status: Married    Spouse Name: N/A    Number of Children: N/A  . Years of Education: N/A   Social History Main Topics  . Smoking status: Former Smoker -- 0.50 packs/day for 5 years    Types: Cigarettes    Quit date: 01/14/1980  . Smokeless tobacco: Never Used  . Alcohol Use: No  . Drug Use: No  . Sexual Activity:    Other Topics Concern  . None   Social History Narrative   Lives with Husband in [place].  Has a dtr in [place], [place] and a son in [place], [place].   Past Surgical History  Procedure Laterality Date  . Vesicovaginal fistula closure w/ tah  1964  . US echocardiography  04-26-09    EF 55-60%  . Cardiovascular stress test  05-13-2005    EF 67%  . Knee  surgery    . Joint replacement  2000    left knee  . Abdominal hysterectomy  1964  . Toe shortened      right 2nd toe  . Cataract extraction      both eyes  . Mass excision  01/13/2012    Procedure: EXCISION MASS;  Surgeon: Ralene Ok, MD;  Location: [facility];  Service: General;  Laterality: Right;  Excision of Right Back Mass   Past Medical History  Diagnosis Date  . Heart murmur     She has a known heart murmur of aortic valve stenosis  . Chronic back pain     She does have a hx of   . Aortic stenosis     a. 10/2010 Echo: EF 60-65%, mild LVH, no rwma, mild AS.  . Diabetes mellitus     adult onset   . Peripheral neuropathy   . GERD (gastroesophageal reflux disease)   . Hypertension   . Pneumonia     hx of  . Bronchitis     hx of  . Cancer 2013    basal cell carcinoma   There were no vitals taken for this visit.  Opioid Risk Score:  Fall Risk Score: Moderate Fall Risk (6-13 points) (educated and fall prevention in the home handout given) Review of Systems  Respiratory: Positive for cough.   Genitourinary:       Bladder control issues  All other systems reviewed and are negative.  VS: BP 134/70 P 85 R 14 WT 213.4lb Ht 63"    Objective:   Physical Exam  Decreased right hip internal rotation Limited spine range of motion with flexion extension and lateral bending Tenderness to palpation bilateral gluteus muscles. No tenderness over the greater trochanter Negative straight leg raising test Normal hip flexor knee extensor and ankle dorsiflexor strength Reduced sensation left foot Mild left ankle edema      Assessment & Plan:  1. Lumbar spondylosis with left sided low back pain improved after left lumbar medial branch radio frequency neurotomy on 11/16/2012.  2. Left Buttocks pain appears to be Sciatic, no evidence of hip joint pathology. I did review CT of the abdomen pelvis from 2012 which did demonstrate some central stenosis at the lower lumbar levels,  which may be why this is persisting. Patient states she cannot tolerate coming off of gabapentin   MRI lumbar spine demonstrates L2-L3 spinal stenosis which would correlate with hip pain complaints. Fortunately these pains have improved.Consider ESI if flares up again Patient still has 48 hydrocodone tablets from Dec. She takes these very sparingly. No new prescription needed.  Continue gabapentin 900 mg 3 times per day

## 2013-05-05 ENCOUNTER — Encounter: Payer: Self-pay | Admitting: Cardiology

## 2013-06-23 ENCOUNTER — Emergency Department (HOSPITAL_COMMUNITY)
Admission: EM | Admit: 2013-06-23 | Discharge: 2013-06-23 | Disposition: A | Discharge status: Home / Self Care | Payer: Medicare HMO | Attending: Emergency Medicine | Admitting: Emergency Medicine

## 2013-06-23 ENCOUNTER — Encounter (HOSPITAL_COMMUNITY): Payer: Self-pay | Admitting: Emergency Medicine

## 2013-06-23 DIAGNOSIS — M7989 Other specified soft tissue disorders: Secondary | ICD-10-CM | POA: Insufficient documentation

## 2013-06-23 DIAGNOSIS — Z8701 Personal history of pneumonia (recurrent): Secondary | ICD-10-CM | POA: Insufficient documentation

## 2013-06-23 DIAGNOSIS — M79609 Pain in unspecified limb: Secondary | ICD-10-CM | POA: Insufficient documentation

## 2013-06-23 DIAGNOSIS — Z7982 Long term (current) use of aspirin: Secondary | ICD-10-CM | POA: Insufficient documentation

## 2013-06-23 DIAGNOSIS — Z8709 Personal history of other diseases of the respiratory system: Secondary | ICD-10-CM | POA: Insufficient documentation

## 2013-06-23 DIAGNOSIS — E119 Type 2 diabetes mellitus without complications: Secondary | ICD-10-CM | POA: Insufficient documentation

## 2013-06-23 DIAGNOSIS — Z87891 Personal history of nicotine dependence: Secondary | ICD-10-CM | POA: Insufficient documentation

## 2013-06-23 DIAGNOSIS — K219 Gastro-esophageal reflux disease without esophagitis: Secondary | ICD-10-CM | POA: Insufficient documentation

## 2013-06-23 DIAGNOSIS — I10 Essential (primary) hypertension: Secondary | ICD-10-CM | POA: Insufficient documentation

## 2013-06-23 DIAGNOSIS — Z79899 Other long term (current) drug therapy: Secondary | ICD-10-CM | POA: Insufficient documentation

## 2013-06-23 DIAGNOSIS — Z8669 Personal history of other diseases of the nervous system and sense organs: Secondary | ICD-10-CM | POA: Insufficient documentation

## 2013-06-23 DIAGNOSIS — R011 Cardiac murmur, unspecified: Secondary | ICD-10-CM | POA: Insufficient documentation

## 2013-06-23 DIAGNOSIS — G8929 Other chronic pain: Secondary | ICD-10-CM | POA: Insufficient documentation

## 2013-06-23 DIAGNOSIS — Z85828 Personal history of other malignant neoplasm of skin: Secondary | ICD-10-CM | POA: Insufficient documentation

## 2013-06-23 LAB — BASIC METABOLIC PANEL
BUN: 25 mg/dL — ABNORMAL HIGH (ref 6–23)
CHLORIDE: 98 meq/L (ref 96–112)
CO2: 29 mEq/L (ref 19–32)
Calcium: 9.5 mg/dL (ref 8.4–10.5)
Creatinine, Ser: 1.03 mg/dL (ref 0.50–1.10)
GFR calc Af Amer: 57 mL/min — ABNORMAL LOW (ref >90)
GFR calc non Af Amer: 50 mL/min — ABNORMAL LOW (ref >90)
GLUCOSE: 142 mg/dL — AB (ref 70–99)
POTASSIUM: 4.4 meq/L (ref 3.7–5.3)
SODIUM: 141 meq/L (ref 137–147)

## 2013-06-23 LAB — CBC
HCT: 35.5 % — ABNORMAL LOW (ref 36.0–46.0)
HEMOGLOBIN: 11.9 g/dL — AB (ref 12.0–15.0)
MCH: 32.4 pg (ref 26.0–34.0)
MCHC: 33.5 g/dL (ref 30.0–36.0)
MCV: 96.7 fL (ref 78.0–100.0)
Platelets: 222 10*3/uL (ref 150–400)
RBC: 3.67 MIL/uL — AB (ref 3.87–5.11)
RDW: 11.9 % (ref 11.5–15.5)
WBC: 10.4 10*3/uL (ref 4.0–10.5)

## 2013-06-23 MED ORDER — ENOXAPARIN SODIUM 100 MG/ML ~~LOC~~ SOLN
100.0000 mg | Freq: Two times a day (BID) | SUBCUTANEOUS | Status: DC
  Administered 2013-06-23: 100 mg via SUBCUTANEOUS
  Filled 2013-06-23: qty 1

## 2013-06-23 NOTE — Progress Notes (Signed)
ANTICOAGULATION CONSULT NOTE - Initial Consult  Pharmacy Consult for Lovenox Indication: Rule out DVT  Allergies  Allergen Reactions  . Codeine Nausea And Vomiting  . Demerol Other (See Comments)    Drunk feeling"  . Statins Other (See Comments)    "hurt"   . Zetia [Ezetimibe] Other (See Comments)    hurt    Patient Measurements:   Stated height: 63 in Stated weight: 212 lb (96kg)  Vital Signs: Temp: 98 F (36.7 C) (06/11 1726) Temp src: Oral (06/11 1726) BP: 125/63 mmHg (06/11 1726) Pulse Rate: 116 (06/11 1726)  Labs:  Recent Labs  06/23/13 1939  HGB 11.9*  HCT 35.5*  PLT 222  CREATININE 1.03    The CrCl is unknown because both a height and weight (above a minimum accepted value) are required for this calculation. 48 ml/min/1.28m2 (normalized)   Medical History: Past Medical History  Diagnosis Date  . Heart murmur     She has a known heart murmur of aortic valve stenosis  . Chronic back pain     She does have a hx of   . Aortic stenosis     a. 10/2010 Echo: EF 60-65%, mild LVH, no rwma, mild AS.  . Diabetes mellitus     adult onset   . Peripheral neuropathy   . GERD (gastroesophageal reflux disease)   . Hypertension   . Pneumonia     hx of  . Bronchitis     hx of  . Cancer 2013    basal cell carcinoma    Medications:    Aspirin 81mg  daily Lasix Gabapentin Hyzaar Metformin Toprol XL Oxybutynin Pantoprazole Crestor Hydrocodone-APAP Albuterol MDI Biotin Calcium + Vitamin D Cranberry Fruit Multivitamin   Assessment: 80 yo female presents to PCP with 2 week history of LLL pain and swelling. She was sent to ED for doppler. Pharmacy consulted to dose Lovenox for presumed DVT.  Goal of Therapy:  Anti-Xa level 0.6-1 units/ml 4hrs after LMWH dose given Monitor platelets by anticoagulation protocol: Yes   Plan:   Lovenox 100mg  sq q12h  Monitor renal function and adjust as needed  Monitor CBC at least [place],  PharmD, BCPS Pager: [phone] 06/23/2013,8:59 PM

## 2013-06-23 NOTE — ED Provider Notes (Signed)
CSN: [id number]     Arrival date & time 06/23/13  1717 History   First MD Initiated Contact with Patient 06/23/13 1918     Chief Complaint  Patient presents with  . Leg Swelling     (Consider location/radiation/quality/duration/timing/severity/associated sxs/prior Treatment) HPI Pt presenting with c/o leg pain and swelling of left lower extremity.  Pt states symptoms began 2 weeks ago and have been getting progressively worse.  Pt was seen at her doctor's office today and advised to come to the ED for a doppler to r/o DVT.  No chest pain, no shortness of breath.  No hx of DVT /PE.  She states she went to a wedding after swelling began- drove to [place]- states the symptoms got worse after the drive, but were not new.  No fever/chills.  Pt has been taking hydrocodone which has helped somewhat, also using heating pad.  There are no other associated systemic symptoms, there are no other alleviating or modifying factors.   Past Medical History  Diagnosis Date  . Heart murmur     She has a known heart murmur of aortic valve stenosis  . Chronic back pain     She does have a hx of   . Aortic stenosis     a. 10/2010 Echo: EF 60-65%, mild LVH, no rwma, mild AS.  . Diabetes mellitus     adult onset   . Peripheral neuropathy   . GERD (gastroesophageal reflux disease)   . Hypertension   . Pneumonia     hx of  . Bronchitis     hx of  . Cancer 2013    basal cell carcinoma   Past Surgical History  Procedure Laterality Date  . Vesicovaginal fistula closure w/ tah  1964  . US echocardiography  04-26-09    EF 55-60%  . Cardiovascular stress test  05-13-2005    EF 67%  . Knee surgery    . Joint replacement  2000    left knee  . Abdominal hysterectomy  1964  . Toe shortened      right 2nd toe  . Cataract extraction      both eyes  . Mass excision  01/13/2012    Procedure: EXCISION MASS;  Surgeon: Ralene Ok, MD;  Location: [facility];  Service: General;  Laterality: Right;  Excision of  Right Back Mass   Family History  Problem Relation Age of Onset  . Other Mother     died @ 69, complications following childbirth  . COPD Father     died @ 72  . Heart failure Father   . Irregular heart beat Sister     1 sister with PPM.   History  Substance Use Topics  . Smoking status: Former Smoker -- 0.50 packs/day for 5 years    Types: Cigarettes    Quit date: 01/14/1980  . Smokeless tobacco: Never Used  . Alcohol Use: No   OB History   Grav Para Term Preterm Abortions TAB SAB Ect Mult Living                 Review of Systems ROS reviewed and all otherwise negative except for mentioned in HPI    Allergies  Codeine; Demerol; Statins; and Zetia  Home Medications   Prior to Admission medications   Medication Sig Start Date End Date Taking? Authorizing Provider  albuterol (PROVENTIL HFA;VENTOLIN HFA) 108 (90 BASE) MCG/ACT inhaler Inhale 1 puff into the lungs every 6 (six) hours as needed for  wheezing or shortness of breath.   Yes Historical Provider, MD  aspirin 81 MG tablet Take 81 mg by mouth every Monday, Wednesday, and Friday. Take 1 on Monday, Wednesday, and Friday only   Yes Historical Provider, MD  Biotin (BIOTIN 5000) 5 MG CAPS Take 5,000 mg by mouth at bedtime.    Yes Historical Provider, MD  Calcium Carbonate-Vitamin D (CALCIUM + D PO) Take 1 tablet by mouth 2 (two) times daily.    Yes Historical Provider, MD  CRANBERRY FRUIT PO Take 1 tablet by mouth every morning.    Yes Historical Provider, MD  furosemide (LASIX) 20 MG tablet Take 20 mg by mouth as needed for fluid (Left foot swelling).   Yes Historical Provider, MD  gabapentin (NEURONTIN) 300 MG capsule Take 900 mg by mouth 3 (three) times daily.    Yes Historical Provider, MD  HYDROcodone-acetaminophen (NORCO/VICODIN) 5-325 MG per tablet Take 1 tablet by mouth 3 (three) times daily. 12/17/12  Yes Charlett Blake, MD  losartan-hydrochlorothiazide (HYZAAR) 50-12.5 MG per tablet Take 1 tablet by mouth  daily.  04/08/13  Yes Historical Provider, MD  metFORMIN (GLUCOPHAGE) 1000 MG tablet Take 1,000 mg by mouth 2 (two) times daily with a meal.   Yes Historical Provider, MD  metoprolol (TOPROL-XL) 50 MG 24 hr tablet Take 50 mg by mouth at bedtime.    Yes Historical Provider, MD  Multiple Vitamin (MULTIVITAMIN) capsule Take 1 capsule by mouth 2 (two) times daily. 01/12/13  Yes Charlett Blake, MD  oxybutynin (DITROPAN-XL) 10 MG 24 hr tablet Take 10 mg by mouth daily.  08/27/12  Yes Historical Provider, MD  pantoprazole (PROTONIX) 40 MG tablet Take 40 mg by mouth every morning.    Yes Historical Provider, MD  Rosuvastatin Calcium (CRESTOR PO) Take 5 mg by mouth at bedtime.    Yes Historical Provider, MD   BP 167/75  Pulse 86  Temp(Src) 98 F (36.7 C) (Oral)  Resp 18  Ht 5\' 5"  (1.651 m)  Wt 212 lb (96.163 kg)  BMI 35.28 kg/m2  SpO2 100% Vitals reviewed Physical Exam Physical Examination: General appearance - alert, well appearing, and in no distress Mental status - alert, oriented to person, place, and time Eyes - pupils equal and reactive, extraocular eye movements intact Mouth - mucous membranes moist, pharynx normal without lesions Chest - clear to auscultation, no wheezes, rales or rhonchi, symmetric air entry Heart - normal rate, regular rhythm, normal S1, S2, no murmurs, rubs, clicks or gallops Abdomen - soft, nontender, nondistended, no masses or organomegaly Extremities - peripheral pulses normal, mild 1+ left lower extremity edema, with mild overlying erythema- does not appear cellulitic, no clubbing or cyanosis Skin - normal coloration and turgor, no rashes  ED Course  Procedures (including critical care time) Labs Review Labs Reviewed  CBC - Abnormal; Notable for the following:    RBC 3.67 (*)    Hemoglobin 11.9 (*)    HCT 35.5 (*)    All other components within normal limits  BASIC METABOLIC PANEL - Abnormal; Notable for the following:    Glucose, Bld 142 (*)    BUN 25  (*)    GFR calc non Af Amer 50 (*)    GFR calc Af Amer 57 (*)    All other components within normal limits    Imaging Review No results found.   EKG Interpretation None      MDM   Final diagnoses:  Left leg swelling    Pt  presenting with c/o left lower extremity swelling over the past 2 weeks.  No elevation in WBC or overlying warmth to suggest cellulitis.  Pt started on lovenox and scheduled for a doppler study in the morning.  Discharged with strict return precautions.  Pt agreeable with plan.   Threasa Beards, MD 06/23/13 2216

## 2013-06-23 NOTE — ED Notes (Signed)
Pt sent by PCP for doppler of leg d/t leg pain and swelling.  Sx started 2 wks ago.  States that she was too busy to get it looked at because she was in a wedding.

## 2013-06-24 ENCOUNTER — Ambulatory Visit (HOSPITAL_COMMUNITY)
Admission: RE | Admit: 2013-06-24 | Discharge: 2013-06-24 | Disposition: A | Discharge status: Home / Self Care | Payer: Medicare HMO | Source: Ambulatory Visit | Attending: Emergency Medicine | Admitting: Emergency Medicine

## 2013-06-24 DIAGNOSIS — M79609 Pain in unspecified limb: Secondary | ICD-10-CM | POA: Insufficient documentation

## 2013-06-24 NOTE — Progress Notes (Signed)
*  PRELIMINARY RESULTS* Vascular Ultrasound Left lower extremity venous duplex has been completed.  Preliminary findings: no evidence of DVT.    Landry Mellow, RDMS, RVT  06/24/2013, 10:24 AM

## 2013-06-30 ENCOUNTER — Telehealth: Payer: Self-pay

## 2013-06-30 MED ORDER — HYDROCODONE-ACETAMINOPHEN 5-325 MG PO TABS: 1.0000 | ORAL_TABLET | Freq: Three times a day (TID) | ORAL | Status: DC

## 2013-06-30 NOTE — Telephone Encounter (Signed)
Patient is requesting a refill on Hydrocodone. Last refill was 12/2012. Last appt was 04/2013 and her next appt is in July. Printed RX for Dr. Letta Pate to sign. Patient will pick up RX next week.

## 2013-07-21 ENCOUNTER — Other Ambulatory Visit (HOSPITAL_COMMUNITY): Payer: Self-pay | Admitting: Family Medicine

## 2013-07-21 DIAGNOSIS — Z1231 Encounter for screening mammogram for malignant neoplasm of breast: Secondary | ICD-10-CM

## 2013-07-26 ENCOUNTER — Ambulatory Visit: Payer: Medicare HMO | Admitting: Physical Medicine & Rehabilitation

## 2013-08-09 ENCOUNTER — Other Ambulatory Visit: Payer: Self-pay

## 2013-08-09 ENCOUNTER — Encounter: Payer: Medicare HMO | Attending: Physical Medicine & Rehabilitation

## 2013-08-09 ENCOUNTER — Encounter: Payer: Self-pay | Admitting: Physical Medicine & Rehabilitation

## 2013-08-09 ENCOUNTER — Ambulatory Visit (HOSPITAL_BASED_OUTPATIENT_CLINIC_OR_DEPARTMENT_OTHER): Payer: Medicare HMO | Admitting: Physical Medicine & Rehabilitation

## 2013-08-09 VITALS — BP 156/74 | HR 83 | Resp 14 | Ht 65.0 in | Wt 216.0 lb

## 2013-08-09 DIAGNOSIS — E1142 Type 2 diabetes mellitus with diabetic polyneuropathy: Secondary | ICD-10-CM | POA: Diagnosis not present

## 2013-08-09 DIAGNOSIS — E1149 Type 2 diabetes mellitus with other diabetic neurological complication: Secondary | ICD-10-CM | POA: Insufficient documentation

## 2013-08-09 DIAGNOSIS — I359 Nonrheumatic aortic valve disorder, unspecified: Secondary | ICD-10-CM | POA: Diagnosis not present

## 2013-08-09 DIAGNOSIS — K219 Gastro-esophageal reflux disease without esophagitis: Secondary | ICD-10-CM | POA: Insufficient documentation

## 2013-08-09 DIAGNOSIS — M545 Low back pain, unspecified: Secondary | ICD-10-CM | POA: Diagnosis not present

## 2013-08-09 DIAGNOSIS — M47817 Spondylosis without myelopathy or radiculopathy, lumbosacral region: Secondary | ICD-10-CM | POA: Insufficient documentation

## 2013-08-09 DIAGNOSIS — I10 Essential (primary) hypertension: Secondary | ICD-10-CM | POA: Diagnosis not present

## 2013-08-09 DIAGNOSIS — R011 Cardiac murmur, unspecified: Secondary | ICD-10-CM | POA: Insufficient documentation

## 2013-08-09 MED ORDER — HYDROCODONE-ACETAMINOPHEN 5-325 MG PO TABS: 1.0000 | ORAL_TABLET | Freq: Three times a day (TID) | ORAL | Status: DC

## 2013-08-09 NOTE — Progress Notes (Signed)
Subjective:    Patient ID: [name] [name], female    DOB: Jun 25, 1930, 80 y.o.   MRN: [id number]  HPI Takes hydrocodone < 3per day Saw PCP last Friday, ordered  TED Hose for LE swelling  Pain Inventory Average Pain 6 Pain Right Now 7 My pain is aching  In the last 24 hours, has pain interfered with the following? General activity 7 Relation with others 6 Enjoyment of life 7 What TIME of day is your pain at its worst? evening Sleep (in general) Fair  Pain is worse with: walking, bending and standing Pain improves with: medication Relief from Meds: 7  Mobility walk with assistance use a cane ability to climb steps?  no do you drive?  yes Do you have any goals in this area?  no  Function retired Do you have any goals in this area?  no  Neuro/Psych bladder control problems trouble walking  Prior Studies Any changes since last visit?  no  Physicians involved in your care Any changes since last visit?  no   Family History  Problem Relation Age of Onset  . Other Mother     died @ 100, complications following childbirth  . COPD Father     died @ 80  . Heart failure Father   . Irregular heart beat Sister     1 sister with PPM.   History   Social History  . Marital Status: Married    Spouse Name: N/A    Number of Children: N/A  . Years of Education: N/A   Social History Main Topics  . Smoking status: Former Smoker -- 0.50 packs/day for 5 years    Types: Cigarettes    Quit date: 01/14/1980  . Smokeless tobacco: Never Used  . Alcohol Use: No  . Drug Use: No  . Sexual Activity:    Other Topics Concern  . None   Social History Narrative   Lives with Husband in [place].  Has a dtr in [place], [place] and a son in [place], [place].   Past Surgical History  Procedure Laterality Date  . Vesicovaginal fistula closure w/ tah  1964  . US echocardiography  04-26-09    EF 55-60%  . Cardiovascular stress test  05-13-2005    EF 67%  . Knee surgery    . Joint  replacement  2000    left knee  . Abdominal hysterectomy  1964  . Toe shortened      right 2nd toe  . Cataract extraction      both eyes  . Mass excision  01/13/2012    Procedure: EXCISION MASS;  Surgeon: Ralene Ok, MD;  Location: [facility];  Service: General;  Laterality: Right;  Excision of Right Back Mass   Past Medical History  Diagnosis Date  . Heart murmur     She has a known heart murmur of aortic valve stenosis  . Chronic back pain     She does have a hx of   . Aortic stenosis     a. 10/2010 Echo: EF 60-65%, mild LVH, no rwma, mild AS.  . Diabetes mellitus     adult onset   . Peripheral neuropathy   . GERD (gastroesophageal reflux disease)   . Hypertension   . Pneumonia     hx of  . Bronchitis     hx of  . Cancer 2013    basal cell carcinoma   BP 156/74  Pulse 83  Resp 14  Ht 5\' 5"  (  1.651 m)  Wt 216 lb (97.977 kg)  BMI 35.94 kg/m2  SpO2 94%  Opioid Risk Score:   Fall Risk Score: Moderate Fall Risk (6-13 points) (pt educated on fall risk, brochure given to pt previously)   Review of Systems  Genitourinary:       Bladder control problems  Musculoskeletal: Positive for back pain and gait problem.  All other systems reviewed and are negative.      Objective:   Physical Exam  Decreased right hip internal rotation  Limited spine range of motion with flexion extension and lateral bending  Tenderness to palpation bilateral gluteus muscles. No tenderness over the greater trochanter  Negative straight leg raising test  Normal hip flexor knee extensor and ankle dorsiflexor strength  Reduced sensation left foot  Mild left ankle edema Reduced medial lateral stability left knee      Assessment & Plan:  1. Lumbar spondylosis with left sided low back pain improved after left lumbar medial branch radio frequency neurotomy on 11/16/2012.  2. Left Buttocks pain appears to be Sciatic, no evidence of hip joint pathology. I did review CT of the abdomen pelvis  from 2012 which did demonstrate some central stenosis at the lower lumbar levels, which may be why this is persisting. Patient states she cannot tolerate coming off of gabapentin  MRI lumbar spine demonstrates L2-L3 spinal stenosis which would correlate with hip pain complaints.  3. History of TKR no complications other than some persistent pain                                                                 hydrocodone for chronic pain prescribed 3 times a day but does not take this dose    . She takes these very sparingly. No new prescription needed.  Continue gabapentin 900 mg 3 times per day

## 2013-08-10 ENCOUNTER — Ambulatory Visit (HOSPITAL_COMMUNITY): Payer: Medicare HMO

## 2013-08-16 ENCOUNTER — Ambulatory Visit (HOSPITAL_COMMUNITY)
Admission: RE | Admit: 2013-08-16 | Discharge: 2013-08-16 | Disposition: A | Discharge status: Home / Self Care | Payer: Medicare HMO | Source: Ambulatory Visit | Attending: Family Medicine | Admitting: Family Medicine

## 2013-08-16 DIAGNOSIS — Z1231 Encounter for screening mammogram for malignant neoplasm of breast: Secondary | ICD-10-CM | POA: Diagnosis not present

## 2013-11-07 ENCOUNTER — Other Ambulatory Visit: Payer: Self-pay | Admitting: Physical Medicine & Rehabilitation

## 2013-11-07 ENCOUNTER — Encounter: Payer: Medicare HMO | Attending: Physical Medicine & Rehabilitation

## 2013-11-07 ENCOUNTER — Ambulatory Visit (HOSPITAL_BASED_OUTPATIENT_CLINIC_OR_DEPARTMENT_OTHER): Payer: Medicare HMO | Admitting: Physical Medicine & Rehabilitation

## 2013-11-07 ENCOUNTER — Encounter: Payer: Self-pay | Admitting: Physical Medicine & Rehabilitation

## 2013-11-07 VITALS — BP 142/85 | HR 100 | Resp 14

## 2013-11-07 DIAGNOSIS — M47817 Spondylosis without myelopathy or radiculopathy, lumbosacral region: Secondary | ICD-10-CM | POA: Insufficient documentation

## 2013-11-07 DIAGNOSIS — Z5181 Encounter for therapeutic drug level monitoring: Secondary | ICD-10-CM

## 2013-11-07 DIAGNOSIS — G894 Chronic pain syndrome: Secondary | ICD-10-CM | POA: Diagnosis not present

## 2013-11-07 DIAGNOSIS — Z79899 Other long term (current) drug therapy: Secondary | ICD-10-CM

## 2013-11-07 MED ORDER — HYDROCODONE-ACETAMINOPHEN 5-325 MG PO TABS: 1.0000 | ORAL_TABLET | Freq: Three times a day (TID) | ORAL | Status: DC

## 2013-11-07 NOTE — Progress Notes (Signed)
Subjective:    Patient ID: [name] [name], female    DOB: 10/15/1930, 80 y.o.   MRN: [id number]  HPI Takes hydrocodone < 3per day  Increased stiffness in the low back Since last visit in July. No falls, no trauma, no new medical issues. No new bowel or bladder dysfunction. No shooting pains down the legs.  Has had some dental issues as well.  Pain Inventory Average Pain 8 Pain Right Now 7 My pain is constant and aching  In the last 24 hours, has pain interfered with the following? General activity 0 Relation with others 0 Enjoyment of life 0 What TIME of day is your pain at its worst? daytime Sleep (in general) Fair  Pain is worse with: walking, bending and standing Pain improves with: rest Relief from Meds: 9  Mobility use a cane  Function retired  Neuro/Psych bladder control problems  Prior Studies Any changes since last visit?  no  Physicians involved in your care Any changes since last visit?  no   Family History  Problem Relation Age of Onset  . Other Mother     died @ 37, complications following childbirth  . COPD Father     died @ 38  . Heart failure Father   . Irregular heart beat Sister     1 sister with PPM.   History   Social History  . Marital Status: Married    Spouse Name: N/A    Number of Children: N/A  . Years of Education: N/A   Social History Main Topics  . Smoking status: Former Smoker -- 0.50 packs/day for 5 years    Types: Cigarettes    Quit date: 01/14/1980  . Smokeless tobacco: Never Used  . Alcohol Use: No  . Drug Use: No  . Sexual Activity:    Other Topics Concern  . None   Social History Narrative   Lives with Husband in [place].  Has a dtr in [place], [place] and a son in [place], [place].   Past Surgical History  Procedure Laterality Date  . Vesicovaginal fistula closure w/ tah  1964  . US echocardiography  04-26-09    EF 55-60%  . Cardiovascular stress test  05-13-2005    EF 67%  . Knee surgery    . Joint  replacement  2000    left knee  . Abdominal hysterectomy  1964  . Toe shortened      right 2nd toe  . Cataract extraction      both eyes  . Mass excision  01/13/2012    Procedure: EXCISION MASS;  Surgeon: Ralene Ok, MD;  Location: [facility];  Service: General;  Laterality: Right;  Excision of Right Back Mass   Past Medical History  Diagnosis Date  . Heart murmur     She has a known heart murmur of aortic valve stenosis  . Chronic back pain     She does have a hx of   . Aortic stenosis     a. 10/2010 Echo: EF 60-65%, mild LVH, no rwma, mild AS.  . Diabetes mellitus     adult onset   . Peripheral neuropathy   . GERD (gastroesophageal reflux disease)   . Hypertension   . Pneumonia     hx of  . Bronchitis     hx of  . Cancer 2013    basal cell carcinoma   BP 142/85  Pulse 100  Resp 14  SpO2 93%  Opioid Risk Score:  Fall Risk Score:    Review of Systems     Objective:   Physical Exam  Nursing note and vitals reviewed. Constitutional: She is oriented to person, place, and time. She appears well-developed.  HENT:  Head: Normocephalic.  Eyes: Pupils are equal, round, and reactive to light.  Neurological: She is alert and oriented to person, place, and time.  Psychiatric: She has a normal mood and affect.   Range of motion lumbar spine 0-25% flexion and extension and right lateral bending Left lateral bending 0%.  No tenderness to palpation in the lumbar paraspinal muscles.       Assessment & Plan:  1. Lumbar spondylosis with left sided low back pain improved after left lumbar medial branch radio frequency neurotomy on 11/16/2012. Now starting to have recurrence  We'll schedule for repeat radiofrequency ablation L3 L4 L5  MRI lumbar spine demonstrates L2-L3 spinal stenosis which would correlate with hip pain complaints .  3. History of TKR no complications other than some persistent pain hydrocodone for chronic pain prescribed 3 times a day but does not  take this dose . She takes these very sparingly. No new prescription needed. She is taking 2 hydrocodone's per day , Previously taking one a day Continue gabapentin 900 mg 3 times per day

## 2013-11-08 LAB — PMP ALCOHOL METABOLITE (ETG): ETGU: NEGATIVE ng/mL

## 2013-11-09 LAB — OPIATES/OPIOIDS (LC/MS-MS)
CODEINE URINE: NEGATIVE ng/mL (ref <50)
HYDROCODONE: 603 ng/mL (ref <50)
HYDROMORPHONE: 403 ng/mL (ref <50)
Morphine Urine: NEGATIVE ng/mL (ref <50)
Norhydrocodone, Ur: 675 ng/mL (ref <50)
Noroxycodone, Ur: NEGATIVE ng/mL (ref <50)
Oxycodone, ur: NEGATIVE ng/mL (ref <50)
Oxymorphone: NEGATIVE ng/mL (ref <50)

## 2013-11-10 LAB — PRESCRIPTION MONITORING PROFILE (SOLSTAS)
AMPHETAMINE/METH: NEGATIVE ng/mL
BARBITURATE SCREEN, URINE: NEGATIVE ng/mL
Benzodiazepine Screen, Urine: NEGATIVE ng/mL
Buprenorphine, Urine: NEGATIVE ng/mL
Cannabinoid Scrn, Ur: NEGATIVE ng/mL
Carisoprodol, Urine: NEGATIVE ng/mL
Cocaine Metabolites: NEGATIVE ng/mL
Creatinine, Urine: 124.54 mg/dL (ref >20.0)
Fentanyl, Ur: NEGATIVE ng/mL
MDMA URINE: NEGATIVE ng/mL
METHADONE SCREEN, URINE: NEGATIVE ng/mL
Meperidine, Ur: NEGATIVE ng/mL
NITRITES URINE, INITIAL: NEGATIVE ug/mL
OXYCODONE SCRN UR: NEGATIVE ng/mL
PH URINE, INITIAL: 6.4 pH (ref 4.5–8.9)
Propoxyphene: NEGATIVE ng/mL
TAPENTADOLUR: NEGATIVE ng/mL
TRAMADOL UR: NEGATIVE ng/mL
Zolpidem, Urine: NEGATIVE ng/mL

## 2013-12-01 ENCOUNTER — Encounter: Payer: Commercial Managed Care - HMO | Attending: Physical Medicine & Rehabilitation

## 2013-12-01 ENCOUNTER — Ambulatory Visit (HOSPITAL_BASED_OUTPATIENT_CLINIC_OR_DEPARTMENT_OTHER): Payer: Medicare HMO | Admitting: Physical Medicine & Rehabilitation

## 2013-12-01 ENCOUNTER — Encounter: Payer: Self-pay | Admitting: Physical Medicine & Rehabilitation

## 2013-12-01 VITALS — BP 148/67 | HR 94 | Resp 14 | Wt 220.0 lb

## 2013-12-01 DIAGNOSIS — M47817 Spondylosis without myelopathy or radiculopathy, lumbosacral region: Secondary | ICD-10-CM | POA: Insufficient documentation

## 2013-12-01 NOTE — Progress Notes (Signed)
Right lumbar L3, L4 medial branch blocks and L5 dorsal ramus injection under fluoroscopic guidance  Indication: Right Lumbar pain which is not relieved by medication management or other conservative care and interfering with self-care and mobility.  Informed consent was obtained after describing risks and benefits of the procedure with the patient, this includes bleeding, bruising, infection, paralysis and medication side effects. The patient wishes to proceed and has given written consent. The patient was placed in a prone position. The lumbar area was marked and prepped with Betadine. One ML of 1% lidocaine was injected into each of 3 areas into the skin and subcutaneous tissue. Then a 22-gauge 3.5 inch spinal needle was inserted targeting the junction of the Right S1 superior articular process and sacral ala junction. Needle was advanced under fluoroscopic guidance. Bone contact was made. Omnipaque 180 was injected x0.5 mL demonstrating no intravascular uptake. Then a solution containing one ML of 4 mg per mL dexamethasone and 3 mL of 2% MPF lidocaine was injected x0.5 mL. Then the Right L5 superior articular process in transverse process junction was targeted. Bone contact was made. Omnipaque 180 was injected x0.5 mL demonstrating no intravascular uptake. Then a solution containing one ML of 4 mg per mL dexamethasone and 3 mL of 2% MPF lidocaine was injected x0.5 mL. Then the Right L4 superior articular process in transverse process junction was targeted. Bone contact was made. Omnipaque 180 was injected x0.5 mL demonstrating no intravascular uptake. Then a solution containing one ML of 4 mg per mL dexamethasone and 3 mL of 2% MPF lidocaine was injected x0.5 mL Patient tolerated procedure well. Post procedure instructions were given. Please refer to post procedure form.

## 2013-12-01 NOTE — Progress Notes (Addendum)
  PROCEDURE RECORD [facility]   Name: [name] [name] DOB:[birth year] MRN: [id number]  Date:12/01/2013  Physician: Alysia Penna, MD    Nurse/CMA: Kristen Loader CMA/ Warnie Belair RN  Allergies:  Allergies  Allergen Reactions  . Codeine Nausea And Vomiting  . Demerol Other (See Comments)    Drunk feeling"  . Statins Other (See Comments)    "hurt"   . Zetia [Ezetimibe] Other (See Comments)    hurt    Consent Signed: Yes.    Is patient diabetic? Yes.    CBG today 116  Pregnant: No. LMP: No LMP recorded. Patient has had a hysterectomy. (age 65-57)  Anticoagulants: no Anti-inflammatory: no Antibiotics: no  Procedure: Right Medial Branch Block Position: Prone Start Time: 12:07 End Time: 12:13  Fluoro Time: 20  RN/CMA Tonja Jezewski RN Ginkle CMA    Time 11:29 12:23pm    BP 148/67 154/60    Pulse 94 82    Respirations 14 14    O2 Sat 94 94    S/S 6 6    Pain Level 5/10 0/10     D/C home with husband, patient A & O X 3, D/C instructions reviewed, and sits independently.

## 2013-12-01 NOTE — Patient Instructions (Signed)

## 2013-12-22 ENCOUNTER — Encounter: Payer: Commercial Managed Care - HMO | Attending: Physical Medicine & Rehabilitation

## 2013-12-22 ENCOUNTER — Ambulatory Visit (HOSPITAL_BASED_OUTPATIENT_CLINIC_OR_DEPARTMENT_OTHER): Payer: Medicare HMO | Admitting: Physical Medicine & Rehabilitation

## 2013-12-22 ENCOUNTER — Encounter: Payer: Self-pay | Admitting: Physical Medicine & Rehabilitation

## 2013-12-22 VITALS — BP 149/56 | HR 115 | Resp 14 | Ht 63.0 in | Wt 221.0 lb

## 2013-12-22 DIAGNOSIS — M47817 Spondylosis without myelopathy or radiculopathy, lumbosacral region: Secondary | ICD-10-CM

## 2013-12-22 MED ORDER — HYDROCODONE-ACETAMINOPHEN 5-325 MG PO TABS: 1.0000 | ORAL_TABLET | Freq: Every day | ORAL | Status: DC | PRN

## 2013-12-22 NOTE — Progress Notes (Signed)
Right lumbar L3, L4 medial branch blocks and L5 dorsal ramus injection under fluoroscopic guidance  Indication: Right Lumbar pain which is not relieved by medication management or other conservative care and interfering with self-care and mobility.  Informed consent was obtained after describing risks and benefits of the procedure with the patient, this includes bleeding, bruising, infection, paralysis and medication side effects. The patient wishes to proceed and has given written consent. The patient was placed in a prone position. The lumbar area was marked and prepped with Betadine. One ML of 1% lidocaine was injected into each of 3 areas into the skin and subcutaneous tissue. Then a 22-gauge 3.5 inch spinal needle was inserted targeting the junction of the Right S1 superior articular process and sacral ala junction. Needle was advanced under fluoroscopic guidance. Bone contact was made. Omnipaque 180 was injected x0.5 mL demonstrating no intravascular uptake. Then a solution containing one ML of 4 mg per mL dexamethasone and 3 mL of 2% MPF lidocaine was injected x0.5 mL. Then the Right L5 superior articular process in transverse process junction was targeted. Bone contact was made. Omnipaque 180 was injected x0.5 mL demonstrating no intravascular uptake. Then a solution containing one ML of 4 mg per mL dexamethasone and 3 mL of 2% MPF lidocaine was injected x0.5 mL. Then the Right L4 superior articular process in transverse process junction was targeted. Bone contact was made. Omnipaque 180 was injected x0.5 mL demonstrating no intravascular uptake. Then a solution containing one ML of 4 mg per mL dexamethasone and 3 mL of 2% MPF lidocaine was injected x0.5 mL Patient tolerated procedure well. Post procedure instructions were given. Please refer to post procedure form.  PROCEDURE RECORD [facility]   Name: [name] [name] DOB:1930/03/16 MRN:  [id number]  Date:12/22/2013  Physician: Alysia Penna, MD    Nurse/CMA: MaryBeth Ginkel  Allergies:  Allergies  Allergen Reactions  . Codeine Nausea And Vomiting  . Demerol Other (See Comments)    Drunk feeling"  . Statins Other (See Comments)    "hurt"   . Zetia [Ezetimibe] Other (See Comments)    hurt    Consent Signed: Yes.    Is patient diabetic? No.  CBG today? .  Pregnant: No. LMP: No LMP recorded. Patient has had a hysterectomy. (age 47-57)  Anticoagulants: no Anti-inflammatory: no Antibiotics: no  Procedure: Medial Branch Block L3,4 &5  Position: Prone Start Time: 1:30 End Time: 1:38 Fluoro Time: 24  RN/CMA MaryBeth Ginkel MaryBeth Ginkel    Time 1:17 1:45PM    BP 149/56 136/54    Pulse 88 100    Respirations 14 14    O2 Sat 98 92    S/S 6 6    Pain Level 7/10 0/10     D/C home with Husband, patient A & O X 3, D/C instructions reviewed, and sits independently.

## 2013-12-22 NOTE — Patient Instructions (Signed)

## 2013-12-22 NOTE — Progress Notes (Deleted)
  PROCEDURE RECORD [facility]   Name: [name] DOB:Sep 13, 1930 MRN: [id number]  Date:12/22/2013  Physician: Alysia Penna, MD    Nurse/CMA: MaryBeth Grinkel Allergies:  Allergies  Allergen Reactions  . Codeine Nausea And Vomiting  . Demerol Other (See Comments)    Drunk feeling"  . Statins Other (See Comments)    "hurt"   . Zetia [Ezetimibe] Other (See Comments)    hurt    Consent Signed: Yes.    Is patient diabetic? Yes.    CBG today?112 Pregnant: No. LMP: No LMP recorded. Patient has had a hysterectomy. (age 35-57)  Anticoagulants: yes (did not take one today) Anti-inflammatory: no Antibiotics: no  Procedure: ***  Position: Prone Start Time: ***  End Time: ***  Fluoro Time: ***  RN/CMA Mancel Parsons     Time 1:10pm     BP 149/56     Pulse 115     Respirations 14     O2 Sat 91     S/S 6     Pain Level *** ***     D/C home with ***, patient A & O X 3, D/C instructions reviewed, and sits independently.

## 2014-01-02 ENCOUNTER — Telehealth: Payer: Self-pay | Admitting: Internal Medicine

## 2014-01-02 ENCOUNTER — Encounter: Payer: Self-pay | Admitting: Cardiology

## 2014-01-02 ENCOUNTER — Ambulatory Visit (INDEPENDENT_AMBULATORY_CARE_PROVIDER_SITE_OTHER): Payer: Medicare HMO | Admitting: Cardiology

## 2014-01-02 ENCOUNTER — Telehealth: Payer: Self-pay | Admitting: Cardiology

## 2014-01-02 VITALS — BP 132/78 | HR 96 | Ht 63.0 in | Wt 222.0 lb

## 2014-01-02 DIAGNOSIS — I491 Atrial premature depolarization: Secondary | ICD-10-CM

## 2014-01-02 DIAGNOSIS — I350 Nonrheumatic aortic (valve) stenosis: Secondary | ICD-10-CM

## 2014-01-02 DIAGNOSIS — R0789 Other chest pain: Secondary | ICD-10-CM

## 2014-01-02 DIAGNOSIS — I119 Hypertensive heart disease without heart failure: Secondary | ICD-10-CM

## 2014-01-02 LAB — CBC WITH DIFFERENTIAL/PLATELET
BASOS ABS: 0 10*3/uL (ref 0.0–0.1)
Basophils Relative: 0.4 % (ref 0.0–3.0)
EOS PCT: 2.2 % (ref 0.0–5.0)
Eosinophils Absolute: 0.2 10*3/uL (ref 0.0–0.7)
HEMATOCRIT: 35.1 % — AB (ref 36.0–46.0)
Hemoglobin: 11.2 g/dL — ABNORMAL LOW (ref 12.0–15.0)
LYMPHS ABS: 2.6 10*3/uL (ref 0.7–4.0)
Lymphocytes Relative: 23.7 % (ref 12.0–46.0)
MCHC: 31.9 g/dL (ref 30.0–36.0)
MCV: 96.7 fl (ref 78.0–100.0)
Monocytes Absolute: 1 10*3/uL (ref 0.1–1.0)
Monocytes Relative: 8.9 % (ref 3.0–12.0)
NEUTROS ABS: 7.1 10*3/uL (ref 1.4–7.7)
Neutrophils Relative %: 64.8 % (ref 43.0–77.0)
Platelets: 261 10*3/uL (ref 150.0–400.0)
RBC: 3.63 Mil/uL — ABNORMAL LOW (ref 3.87–5.11)
RDW: 13.4 % (ref 11.5–15.5)
WBC: 10.9 10*3/uL — ABNORMAL HIGH (ref 4.0–10.5)

## 2014-01-02 LAB — BASIC METABOLIC PANEL
BUN: 20 mg/dL (ref 6–23)
CHLORIDE: 97 meq/L (ref 96–112)
CO2: 36 mEq/L — ABNORMAL HIGH (ref 19–32)
Calcium: 8.6 mg/dL (ref 8.4–10.5)
Creatinine, Ser: 0.9 mg/dL (ref 0.4–1.2)
GFR: 64.58 mL/min (ref >60.00)
GLUCOSE: 73 mg/dL (ref 70–99)
POTASSIUM: 4.1 meq/L (ref 3.5–5.1)
Sodium: 141 mEq/L (ref 135–145)

## 2014-01-02 LAB — MAGNESIUM: Magnesium: 1.6 mg/dL (ref 1.5–2.5)

## 2014-01-02 MED ORDER — NITROGLYCERIN 0.4 MG SL SUBL: 0.4000 mg | SUBLINGUAL_TABLET | SUBLINGUAL | Status: DC | PRN

## 2014-01-02 MED ORDER — METOPROLOL SUCCINATE ER 50 MG PO TB24: 50.0000 mg | ORAL_TABLET | Freq: Two times a day (BID) | ORAL | Status: DC

## 2014-01-02 NOTE — Progress Notes (Signed)
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  She comes in because of increasing exertional dyspnea with effort such as walking up hills.  She has also experienced some heavy squeezing discomfort in her chest.  There has been no arm radiation.  She has been feeling tired and exhausted.  She has a past history of mild aortic stenosis.  Her last echocardiogram was 10/18/10 and showed an ejection fraction of 60-65% with mild aortic stenosis with peak gradient of 22 and mean gradient of 11. The patient has a past history of premature atrial beats.  She recently was seen in the recovery room following surgery at [facility] where her not to was initially felt to show new atrial fibrillation but on careful review she was having sinus rhythm with frequent premature atrial contractions.  These are asymptomatic.  The patient has not been experiencing any chest pain or shortness of breath.  Her EKG today again shows frequent PACs and runs of PACs but no atrial fibrillation.  Current Outpatient Prescriptions  Medication Sig Dispense Refill  . albuterol (PROVENTIL HFA;VENTOLIN HFA) 108 (90 BASE) MCG/ACT inhaler Inhale 1 puff into the lungs every 6 (six) hours as needed for wheezing or shortness of breath.    Marland Kitchen aspirin 81 MG tablet Take 81 mg by mouth every Monday, Wednesday, and Friday. Take 1 on Monday, Wednesday, and Friday only    . Calcium Carbonate-Vitamin D (CALCIUM + D PO) Take 1 tablet by mouth 2 (two) times daily.     . furosemide (LASIX) 20 MG tablet Take 20 mg by mouth as needed for fluid (Left foot swelling).    . gabapentin (NEURONTIN) 300 MG capsule Take 900 mg by mouth 3 (three) times daily.     Marland Kitchen HYDROcodone-acetaminophen (NORCO/VICODIN) 5-325 MG per tablet Take 1-2 tablets by mouth daily as needed for moderate pain. 40  tablet 0  . losartan-hydrochlorothiazide (HYZAAR) 50-12.5 MG per tablet Take 1 tablet by mouth daily.     . metFORMIN (GLUCOPHAGE) 1000 MG tablet Take 1,000 mg by mouth 2 (two) times daily with a meal.    . metoprolol succinate (TOPROL-XL) 50 MG 24 hr tablet Take 1 tablet (50 mg total) by mouth 2 (two) times daily. 180 tablet 3  . oxybutynin (DITROPAN-XL) 10 MG 24 hr tablet Take 10 mg by mouth daily.     . pantoprazole (PROTONIX) 40 MG tablet Take 40 mg by mouth every morning.     . Rosuvastatin Calcium (CRESTOR PO) Take 5 mg by mouth at bedtime.     . triamcinolone (KENALOG) 0.025 % cream Apply 1 application topically as needed (as needed for leg pain).     . nitroGLYCERIN (NITROSTAT) 0.4 MG SL tablet Place 1 tablet (0.4 mg total) under the tongue every 5 (five) minutes as needed for chest pain. 25 tablet 6   No current facility-administered medications for this visit.    Allergies  Allergen Reactions  . Codeine Nausea And Vomiting  . Demerol Other (See Comments)    Drunk feeling"  . Statins Other (See Comments)    "hurt"   . Zetia [Ezetimibe] Other (See Comments)    hurt    Patient Active Problem List   Diagnosis Date Noted  . Cough 01/25/2013  . Lumbosacral spondylosis without myelopathy 05/07/2012  .  Disorders of sacrum 05/07/2012  . Degeneration of lumbar or lumbosacral intervertebral disc 05/07/2012  . Diabetic peripheral neuropathy associated with type 2 diabetes mellitus 05/07/2012  . PAC (premature atrial contraction) 01/21/2012  . Chronic or unspecified duodenal ulcer with hemorrhage, without mention of obstruction 11/12/2010  . Pure hypercholesterolemia 11/12/2010  . Benign hypertensive heart disease without heart failure 11/12/2010  . Diabetes mellitus without mention of complication 65/78/4696  . Aortic stenosis, mild 11/12/2010    History  Smoking status  . Former Smoker -- 0.50 packs/day for 5 years  . Types: Cigarettes  . Quit date: 01/14/1980  Smokeless  tobacco  . Never Used    History  Alcohol Use No    Family History  Problem Relation Age of Onset  . Other Mother     died @ 40, complications following childbirth  . COPD Father     died @ 90  . Heart failure Father   . Irregular heart beat Sister     1 sister with PPM.    Review of Systems: The patient denies any heat or cold intolerance.  No weight gain or weight loss.  The patient denies headaches or blurry vision.  There is no cough or sputum production.  The patient denies dizziness.  There is no hematuria or hematochezia.  The patient denies any muscle aches or arthritis.  The patient denies any rash.  The patient denies frequent falling or instability.  There is no history of depression or anxiety.  All other systems were reviewed and are negative.   Physical Exam: Filed Vitals:   01/02/14 1435  BP: 132/78  Pulse: 96   the general appearance reveals a well-developed well-nourished woman in no distress.The head and neck exam reveals pupils equal and reactive.  Extraocular movements are full.  There is no scleral icterus.  The mouth and pharynx are normal.  The neck is supple.  The carotids reveal no bruits.  The jugular venous pressure is normal.  The  thyroid is not enlarged.  There is no lymphadenopathy.  The chest is clear to percussion and auscultation.  There are no rales or rhonchi.  Expansion of the chest is symmetrical.  The precordium is quiet.  The pulse is irregular because of frequent PACs  The first heart sound is normal.  The second heart sound is physiologically split.  There is no  gallop rub or click.  There is a soft systolic ejection murmur at the base.  There is no abnormal lift or heave.  The abdomen is soft and nontender.  The bowel sounds are normal.  The liver and spleen are not enlarged.  There are no abdominal masses.  There are no abdominal bruits.  Extremities reveal good pedal pulses.  There is no phlebitis or edema.  There is no cyanosis or clubbing.   Strength is normal and symmetrical in all extremities.  There is no lateralizing weakness.  There are no sensory deficits.  The skin is warm and dry.  There is no rash.   EKG confirms sinus tachycardia and frequent PACs and in no acute ischemic changes.   Assessment: 1.  Exertional chest tightness rule out angina pectoris 2.  Mild aortic stenosis 3.  Diabetes mellitus 4.  Benign hypertensive heart disease without heart failure 5.  Osteoarthritis.  Walks with a cane. 6.  Frequent premature atrial beats 7.  High stress level secondary to her husband's dementia problems.  Disposition: For her symptomatic palpitations and tachycardia we will increase her  Toprol up to 50 mg twice a day. To evaluate her symptoms of chest tightness we will have her return for a Lexiscan Myoview stress test. Today we are checking labs including basal metabolic panel TSH free T4 serum magnesium and CBC  She will keep her previously scheduled appointment for January 7

## 2014-01-02 NOTE — Patient Instructions (Addendum)
INCREASE METOPROLOL TO 50 MG TWICE DAILY.   Your physician recommends that you have labs today.cbc bmet tsh free t4 and magnesium.  Your physician has requested that you have a lexiscan myoview. For further information please visit [email or web address]. Please follow instruction sheet, as given.  Keep your appointment on January 19 2014.  Use your NTG under your tongue for recurrent chest pain. May take one tablet every 5 minutes. If you are still having discomfort after 3 tablets in 15 minutes, call 911.

## 2014-01-02 NOTE — Telephone Encounter (Signed)
I was contacted by the patient because of an episode of chest pain that occurred this evening while wrapping presents.  The pain resolved after taking SL NTG x 2, and she is currently chest pain free.  The pain is similar to what she has been experiencing for the last several days and discussed in clinic today with Dr. Mare Ferrari.  He has arranged for the patient to have a myocardial perfusion stress test next week.  I advised the patient to call 911 if her chest pain worsens, particularly if it occurs at rest or does not resolve with SL NTG x 3.  Otherwise, she should keep her appointments for the stress test and follow-up with Dr. Mare Ferrari, as previously discussed.  She is in agreement with this plan.  I will forward this message to Dr. Mare Ferrari for his review.

## 2014-01-02 NOTE — Telephone Encounter (Signed)
New Message     Patient states that her heart was out of rhythm on Friday and then again Saturday and she has been so tired and can not function. She is all right but would like a call back to see what is going on because she is worried.

## 2014-01-02 NOTE — Telephone Encounter (Signed)
Patient seeing  Dr. Mare Ferrari today, aware of appointment

## 2014-01-03 ENCOUNTER — Encounter: Payer: Self-pay | Admitting: Nurse Practitioner

## 2014-01-03 ENCOUNTER — Encounter (HOSPITAL_COMMUNITY)
Admission: AD | Disposition: A | Discharge status: Home / Self Care | Payer: Self-pay | Source: Ambulatory Visit | Attending: Cardiology

## 2014-01-03 ENCOUNTER — Encounter (HOSPITAL_COMMUNITY): Payer: Self-pay | Admitting: General Practice

## 2014-01-03 ENCOUNTER — Observation Stay (HOSPITAL_COMMUNITY)
Admission: AD | Admit: 2014-01-03 | Discharge: 2014-01-03 | Disposition: A | Discharge status: Home / Self Care | Payer: Commercial Managed Care - HMO | Source: Ambulatory Visit | Attending: Cardiology | Admitting: Cardiology

## 2014-01-03 ENCOUNTER — Other Ambulatory Visit: Payer: Self-pay | Admitting: Nurse Practitioner

## 2014-01-03 ENCOUNTER — Ambulatory Visit (INDEPENDENT_AMBULATORY_CARE_PROVIDER_SITE_OTHER): Payer: Medicare HMO | Admitting: Nurse Practitioner

## 2014-01-03 ENCOUNTER — Other Ambulatory Visit: Payer: Self-pay | Admitting: Cardiology

## 2014-01-03 VITALS — BP 158/88 | HR 105 | Ht 63.0 in | Wt 220.8 lb

## 2014-01-03 DIAGNOSIS — I350 Nonrheumatic aortic (valve) stenosis: Secondary | ICD-10-CM | POA: Insufficient documentation

## 2014-01-03 DIAGNOSIS — R079 Chest pain, unspecified: Secondary | ICD-10-CM

## 2014-01-03 DIAGNOSIS — I480 Paroxysmal atrial fibrillation: Secondary | ICD-10-CM | POA: Diagnosis not present

## 2014-01-03 DIAGNOSIS — I10 Essential (primary) hypertension: Secondary | ICD-10-CM | POA: Insufficient documentation

## 2014-01-03 DIAGNOSIS — Z87891 Personal history of nicotine dependence: Secondary | ICD-10-CM | POA: Diagnosis not present

## 2014-01-03 DIAGNOSIS — E119 Type 2 diabetes mellitus without complications: Secondary | ICD-10-CM

## 2014-01-03 DIAGNOSIS — Z8701 Personal history of pneumonia (recurrent): Secondary | ICD-10-CM | POA: Diagnosis not present

## 2014-01-03 DIAGNOSIS — I200 Unstable angina: Principal | ICD-10-CM

## 2014-01-03 DIAGNOSIS — G8929 Other chronic pain: Secondary | ICD-10-CM | POA: Diagnosis not present

## 2014-01-03 DIAGNOSIS — E785 Hyperlipidemia, unspecified: Secondary | ICD-10-CM | POA: Insufficient documentation

## 2014-01-03 DIAGNOSIS — M549 Dorsalgia, unspecified: Secondary | ICD-10-CM | POA: Insufficient documentation

## 2014-01-03 DIAGNOSIS — K219 Gastro-esophageal reflux disease without esophagitis: Secondary | ICD-10-CM | POA: Insufficient documentation

## 2014-01-03 DIAGNOSIS — Z85828 Personal history of other malignant neoplasm of skin: Secondary | ICD-10-CM | POA: Insufficient documentation

## 2014-01-03 DIAGNOSIS — R0789 Other chest pain: Secondary | ICD-10-CM

## 2014-01-03 HISTORY — PX: LEFT HEART CATHETERIZATION WITH CORONARY ANGIOGRAM: SHX5451

## 2014-01-03 HISTORY — DX: Other chest pain: R07.89

## 2014-01-03 LAB — PROTIME-INR
INR: 1 (ref 0.00–1.49)
Prothrombin Time: 13.3 seconds (ref 11.6–15.2)

## 2014-01-03 LAB — COMPREHENSIVE METABOLIC PANEL
ALT: 11 U/L (ref 0–35)
ANION GAP: 9 (ref 5–15)
AST: 17 U/L (ref 0–37)
Albumin: 3.6 g/dL (ref 3.5–5.2)
Alkaline Phosphatase: 71 U/L (ref 39–117)
BILIRUBIN TOTAL: 0.4 mg/dL (ref 0.3–1.2)
BUN: 18 mg/dL (ref 6–23)
CO2: 33 mmol/L — AB (ref 19–32)
CREATININE: 0.93 mg/dL (ref 0.50–1.10)
Calcium: 8.9 mg/dL (ref 8.4–10.5)
Chloride: 99 mEq/L (ref 96–112)
GFR calc Af Amer: 65 mL/min — ABNORMAL LOW (ref >90)
GFR calc non Af Amer: 56 mL/min — ABNORMAL LOW (ref >90)
Glucose, Bld: 117 mg/dL — ABNORMAL HIGH (ref 70–99)
Potassium: 4.7 mmol/L (ref 3.5–5.1)
Sodium: 141 mmol/L (ref 135–145)
Total Protein: 6.4 g/dL (ref 6.0–8.3)

## 2014-01-03 LAB — CBC WITH DIFFERENTIAL/PLATELET
Basophils Absolute: 0 10*3/uL (ref 0.0–0.1)
Basophils Relative: 0 % (ref 0–1)
Eosinophils Absolute: 0.2 10*3/uL (ref 0.0–0.7)
Eosinophils Relative: 2 % (ref 0–5)
HEMATOCRIT: 34.5 % — AB (ref 36.0–46.0)
HEMOGLOBIN: 10.8 g/dL — AB (ref 12.0–15.0)
LYMPHS PCT: 18 % (ref 12–46)
Lymphs Abs: 1.5 10*3/uL (ref 0.7–4.0)
MCH: 30.7 pg (ref 26.0–34.0)
MCHC: 31.3 g/dL (ref 30.0–36.0)
MCV: 98 fL (ref 78.0–100.0)
MONO ABS: 0.6 10*3/uL (ref 0.1–1.0)
MONOS PCT: 7 % (ref 3–12)
Neutro Abs: 6.2 10*3/uL (ref 1.7–7.7)
Neutrophils Relative %: 73 % (ref 43–77)
Platelets: 222 10*3/uL (ref 150–400)
RBC: 3.52 MIL/uL — ABNORMAL LOW (ref 3.87–5.11)
RDW: 13.1 % (ref 11.5–15.5)
WBC: 8.5 10*3/uL (ref 4.0–10.5)

## 2014-01-03 LAB — T4, FREE: Free T4: 0.85 ng/dL (ref 0.60–1.60)

## 2014-01-03 LAB — GLUCOSE, CAPILLARY
GLUCOSE-CAPILLARY: 133 mg/dL — AB (ref 70–99)
Glucose-Capillary: 145 mg/dL — ABNORMAL HIGH (ref 70–99)

## 2014-01-03 LAB — MAGNESIUM: MAGNESIUM: 1.5 mg/dL (ref 1.5–2.5)

## 2014-01-03 LAB — TSH: TSH: 0.99 u[IU]/mL (ref 0.35–4.50)

## 2014-01-03 LAB — TROPONIN I: Troponin I: <0.03 ng/mL (ref <0.031)

## 2014-01-03 SURGERY — LEFT HEART CATHETERIZATION WITH CORONARY ANGIOGRAM
Anesthesia: LOCAL

## 2014-01-03 MED ORDER — ROSUVASTATIN CALCIUM 5 MG PO TABS
5.0000 mg | ORAL_TABLET | Freq: Every day | ORAL | Status: DC
  Filled 2014-01-03: qty 1

## 2014-01-03 MED ORDER — SODIUM CHLORIDE 0.9 % IV SOLN
1.0000 mL/kg/h | INTRAVENOUS | Status: DC
  Administered 2014-01-03: 1 mL/kg/h via INTRAVENOUS

## 2014-01-03 MED ORDER — METOPROLOL SUCCINATE ER 50 MG PO TB24
50.0000 mg | ORAL_TABLET | Freq: Every day | ORAL | Status: DC
  Administered 2014-01-03: 50 mg via ORAL
  Filled 2014-01-03: qty 1

## 2014-01-03 MED ORDER — LOSARTAN POTASSIUM 50 MG PO TABS
50.0000 mg | ORAL_TABLET | Freq: Every day | ORAL | Status: DC
  Administered 2014-01-03: 50 mg via ORAL
  Filled 2014-01-03: qty 1

## 2014-01-03 MED ORDER — OXYBUTYNIN CHLORIDE ER 10 MG PO TB24
10.0000 mg | ORAL_TABLET | Freq: Every day | ORAL | Status: DC
  Filled 2014-01-03: qty 1

## 2014-01-03 MED ORDER — LIDOCAINE HCL (PF) 1 % IJ SOLN
INTRAMUSCULAR | Status: AC
  Filled 2014-01-03: qty 30

## 2014-01-03 MED ORDER — SODIUM CHLORIDE 0.9 % IJ SOLN: 3.0000 mL | Freq: Two times a day (BID) | INTRAMUSCULAR | Status: DC

## 2014-01-03 MED ORDER — ACETAMINOPHEN 325 MG PO TABS: 650.0000 mg | ORAL_TABLET | ORAL | Status: DC | PRN

## 2014-01-03 MED ORDER — HEPARIN SODIUM (PORCINE) 1000 UNIT/ML IJ SOLN
INTRAMUSCULAR | Status: AC
  Filled 2014-01-03: qty 1

## 2014-01-03 MED ORDER — ASPIRIN 81 MG PO CHEW
81.0000 mg | CHEWABLE_TABLET | ORAL | Status: AC
  Administered 2014-01-03: 81 mg via ORAL
  Filled 2014-01-03: qty 1

## 2014-01-03 MED ORDER — ASPIRIN EC 81 MG PO TBEC: 81.0000 mg | DELAYED_RELEASE_TABLET | Freq: Every day | ORAL | Status: DC

## 2014-01-03 MED ORDER — ONDANSETRON HCL 4 MG/2ML IJ SOLN: 4.0000 mg | Freq: Four times a day (QID) | INTRAMUSCULAR | Status: DC | PRN

## 2014-01-03 MED ORDER — SODIUM CHLORIDE 0.9 % IJ SOLN: 3.0000 mL | INTRAMUSCULAR | Status: DC | PRN

## 2014-01-03 MED ORDER — HEPARIN (PORCINE) IN NACL 2-0.9 UNIT/ML-% IJ SOLN
INTRAMUSCULAR | Status: AC
  Filled 2014-01-03: qty 1000

## 2014-01-03 MED ORDER — HYDROCHLOROTHIAZIDE 12.5 MG PO CAPS
12.5000 mg | ORAL_CAPSULE | Freq: Every day | ORAL | Status: DC
  Administered 2014-01-03: 12.5 mg via ORAL
  Filled 2014-01-03: qty 1

## 2014-01-03 MED ORDER — MIDAZOLAM HCL 2 MG/2ML IJ SOLN
INTRAMUSCULAR | Status: AC
  Filled 2014-01-03: qty 2

## 2014-01-03 MED ORDER — SODIUM CHLORIDE 0.9 % IV SOLN: 250.0000 mL | INTRAVENOUS | Status: DC | PRN

## 2014-01-03 MED ORDER — NITROGLYCERIN 0.4 MG SL SUBL: 0.4000 mg | SUBLINGUAL_TABLET | SUBLINGUAL | Status: DC | PRN

## 2014-01-03 MED ORDER — VERAPAMIL HCL 2.5 MG/ML IV SOLN
INTRAVENOUS | Status: AC
  Filled 2014-01-03: qty 2

## 2014-01-03 MED ORDER — FENTANYL CITRATE 0.05 MG/ML IJ SOLN
INTRAMUSCULAR | Status: AC
  Filled 2014-01-03: qty 2

## 2014-01-03 MED ORDER — HYDROCODONE-ACETAMINOPHEN 5-325 MG PO TABS: 1.0000 | ORAL_TABLET | ORAL | Status: DC | PRN

## 2014-01-03 MED ORDER — HEPARIN SODIUM (PORCINE) 5000 UNIT/ML IJ SOLN: 5000.0000 [IU] | Freq: Three times a day (TID) | INTRAMUSCULAR | Status: DC

## 2014-01-03 MED ORDER — PANTOPRAZOLE SODIUM 40 MG PO TBEC
40.0000 mg | DELAYED_RELEASE_TABLET | Freq: Every day | ORAL | Status: DC
  Administered 2014-01-03: 40 mg via ORAL
  Filled 2014-01-03: qty 1

## 2014-01-03 MED ORDER — NITROGLYCERIN 1 MG/10 ML FOR IR/CATH LAB
INTRA_ARTERIAL | Status: AC
  Filled 2014-01-03: qty 10

## 2014-01-03 MED ORDER — SODIUM CHLORIDE 0.9 % IV SOLN: INTRAVENOUS | Status: AC

## 2014-01-03 MED ORDER — INSULIN ASPART 100 UNIT/ML ~~LOC~~ SOLN: 0.0000 [IU] | Freq: Three times a day (TID) | SUBCUTANEOUS | Status: DC

## 2014-01-03 MED ORDER — GABAPENTIN 600 MG PO TABS
900.0000 mg | ORAL_TABLET | Freq: Three times a day (TID) | ORAL | Status: DC
  Administered 2014-01-03: 900 mg via ORAL
  Filled 2014-01-03 (×3): qty 1.5

## 2014-01-03 MED ORDER — METFORMIN HCL 1000 MG PO TABS
1000.0000 mg | ORAL_TABLET | Freq: Two times a day (BID) | ORAL | Status: DC
Start: 2014-01-06 — End: 2014-09-15

## 2014-01-03 NOTE — Progress Notes (Signed)
  12 lead EKG was obtained as requested by Dr. Gwenlyn Found. This revealed SR with PACs. The EKG was also reviewed by Dr. Gwenlyn Found who agreed. Decision was made not to discharge on a NOAC. We will arrange for a 30 day event monitor and f/u with Dr. Mare Ferrari.   Juriel Cid, Silas Flood 01/03/2014

## 2014-01-03 NOTE — CV Procedure (Signed)
Jaimee Corum is a 80 y.o. female    [id number] LOCATION:  FACILITY: [facility]  PHYSICIAN: Quay Burow, M.D. [birth year]   DATE OF PROCEDURE:  01/03/2014  DATE OF DISCHARGE:     CARDIAC CATHETERIZATION     History obtained from chart review.Mrs. Maiorino is an 80 year old moderately overweight Caucasian female patient of Dr. Jeannine Boga pills with a history of hypertension, hyperlipidemia and diabetes. She's had normal stress test in the past. She has atrial fibrillation. She's had recent chest pain and was scheduled for a Myoview stress test however because of recurrent episode last night she was brought into the hospital today for cardiac catheter catheterization to define her anatomy.   PROCEDURE DESCRIPTION:   The patient was brought to the second floor [facility] cath lab in the postabsorptive state. She was premedicated with Valium 5 mg by mouth, IV Versed and fentanyl. Her right wristwas prepped and shaved in usual sterile fashion. Xylocaine 1% was used for local anesthesia. A 6 French sheath was inserted into the right radial artery using standard Seldinger technique. The patient received  5000 units  of heparin intravenously.  A 5 [place] TIG catheter and pigtail catheters were used for selective coronary angiography and left ventriculography respectively. Visipaque dye was used for the entirety of the case. Retrograde aortic, left ventricular and pullback pressures were recorded.    HEMODYNAMICS:    AO SYSTOLIC/AO DIASTOLIC: 213/08   LV SYSTOLIC/LV DIASTOLIC: 657/84  ANGIOGRAPHIC RESULTS:   1. Left main; normal  2. LAD; normal 3. Left circumflex; normal.  4. Right coronary artery; dominant and normal 5. Left ventriculography; RAO left ventriculogram was performed using  25 mL of Visipaque dye at 12 mL/second. The overall LVEF estimated  60 %  Without wall motion abnormalities  IMPRESSION:Mrs. Czajka has normal coronary arteries and normal left ventricular function.  I believe her chest pain is noncardiac. She does have atrial fibrillation and has a CHA2DSVASC2 score of 4 making her high risk and suggesting that she would benefit from the addition of an oral anticoagulant such as a NOAC. The sheath was removed and a TR band was placed on the right wrist to achieve patent hemostasis. The patient left the lab in stable condition. She'll be discharged home later today on an oral anticoagulant and will follow-up with Dr. Mare Ferrari in one to 2 weeks.  Lorretta Harp MD, [facility] 01/03/2014 3:09 PM

## 2014-01-03 NOTE — H&P (Signed)
Patient ID: [name] [name] MRN: [id number], DOB/AGE: 07/14/1930   Admit date: (Not on file)  Primary Physician: Cari Caraway, MD Primary Cardiologist: Vaughan Browner, MD   Pt. Profile:  80 y/o female with a h/o mild aortic stenosis who just saw Dr. Mare Ferrari yesterday 2/2 unstable angina who presents today for visit and elective admission with plan for catheterization.  Problem List  Past Medical History  Diagnosis Date  . Mild aortic stenosis     a. 10/2010 Echo: Ef 60-65%, no rwma, mild LVH no AS.  Marland Kitchen Chronic back pain   . Type II diabetes mellitus   . Peripheral neuropathy   . GERD (gastroesophageal reflux disease)   . Hypertension   . History of pneumonia   . History of bronchitis   . Basal cell carcinoma     a. 2013.  Marland Kitchen Unstable angina   . PAC (premature atrial contraction)     Past Surgical History  Procedure Laterality Date  . Vesicovaginal fistula closure w/ tah  1964  . US echocardiography  04-26-09    EF 55-60%  . Cardiovascular stress test  05-13-2005    EF 67%  . Knee surgery    . Joint replacement  2000    left knee  . Abdominal hysterectomy  1964  . Toe shortened      right 2nd toe  . Cataract extraction      both eyes  . Mass excision  01/13/2012    Procedure: EXCISION MASS;  Surgeon: Ralene Ok, MD;  Location: [facility];  Service: General;  Laterality: Right;  Excision of Right Back Mass     Allergies  Allergies  Allergen Reactions  . Codeine Nausea And Vomiting  . Demerol Other (See Comments)    Drunk feeling"  . Statins Other (See Comments)    "hurt"   . Zetia [Ezetimibe] Other (See Comments)    hurt    HPI  80 y/o female with the above problem list. She does not have a h/o CAD but reports two prior normal stress tests.  She was in her usoh until this past  Friday, when she was walking with her husband and developed severe sscp and dyspnea, lasting ~ 15 mins and resolving with rest.  She had recurrent Ss on both Saturday and Sunday,  lasting between 5 and 15 mins.  She saw Dr. Mare Ferrari yesterday and decision was made to pursue lexiscan stress testing.  Unfortunately, pt developed recurrent chest pain last night while wrapping presents, which resolved within 5 mins after taking 2 sl NTG tabs.  She spoke to the fellow on call and that message was passed on to Dr. Mare Ferrari this AM.  Pt was contacted and advised to come for office visit and arrange for cath.  Currently, pt is c/p free.  She has not had any c/p this AM.  She denies pnd, orthopnea, n, v, dizziness, syncope, edema, weight gain, or early satiety.   Home Medications  Prior to Admission medications   Medication Sig Start Date End Date Taking? Authorizing Provider  albuterol (PROVENTIL HFA;VENTOLIN HFA) 108 (90 BASE) MCG/ACT inhaler Inhale 1 puff into the lungs every 6 (six) hours as needed for wheezing or shortness of breath.   Yes Historical Provider, MD  aspirin 81 MG tablet Take 81 mg by mouth every Monday, Wednesday, and Friday. Take 1 on Monday, Wednesday, and Friday only   Yes Historical Provider, MD  Calcium Carbonate-Vitamin D (CALCIUM + D PO) Take 1 tablet by mouth  2 (two) times daily.    Yes Historical Provider, MD  furosemide (LASIX) 20 MG tablet Take 20 mg by mouth as needed for fluid (Left foot swelling).   Yes Historical Provider, MD  gabapentin (NEURONTIN) 300 MG capsule Take 900 mg by mouth 3 (three) times daily.    Yes Historical Provider, MD  HYDROcodone-acetaminophen (NORCO/VICODIN) 5-325 MG per tablet Take 1-2 tablets by mouth daily as needed for moderate pain. 12/22/13  Yes Charlett Blake, MD  losartan-hydrochlorothiazide (HYZAAR) 50-12.5 MG per tablet Take 1 tablet by mouth daily.  04/08/13  Yes Historical Provider, MD  metFORMIN (GLUCOPHAGE) 1000 MG tablet Take 1,000 mg by mouth 2 (two) times daily with a meal.   Yes Historical Provider, MD  metoprolol succinate (TOPROL-XL) 50 MG 24 hr tablet Take 1 tablet (50 mg total) by mouth 2 (two) times  daily. 01/02/14  Yes Darlin Coco, MD  nitroGLYCERIN (NITROSTAT) 0.4 MG SL tablet Place 1 tablet (0.4 mg total) under the tongue every 5 (five) minutes as needed for chest pain. 01/02/14  Yes Darlin Coco, MD  oxybutynin (DITROPAN-XL) 10 MG 24 hr tablet Take 10 mg by mouth daily.  08/27/12  Yes Historical Provider, MD  pantoprazole (PROTONIX) 40 MG tablet Take 40 mg by mouth every morning.    Yes Historical Provider, MD  Rosuvastatin Calcium (CRESTOR PO) Take 5 mg by mouth at bedtime.    Yes Historical Provider, MD  triamcinolone (KENALOG) 0.025 % cream Apply 1 application topically as needed (as needed for leg pain).  08/08/13  Yes Historical Provider, MD    Family History  Family History  Problem Relation Age of Onset  . Other Mother     died @ 55, complications following childbirth  . COPD Father     died @ 70  . Heart failure Father   . Irregular heart beat Sister     1 sister with PPM.    Social History  History   Social History  . Marital Status: Married    Spouse Name: N/A    Number of Children: N/A  . Years of Education: N/A   Occupational History  . Not on file.   Social History Main Topics  . Smoking status: Former Smoker -- 0.50 packs/day for 5 years    Types: Cigarettes    Quit date: 01/14/1980  . Smokeless tobacco: Never Used  . Alcohol Use: No  . Drug Use: No  . Sexual Activity: Not on file   Other Topics Concern  . Not on file   Social History Narrative   Lives with Husband in [place].  Has a dtr in [place], [place] and a son in [place], [place].     Review of Systems General:  No chills, fever, night sweats or weight changes.  +++ fatigue Cardiovascular:  +++ chest pain, +++ dyspnea on exertion, edema, orthopnea, palpitations, paroxysmal nocturnal dyspnea. Dermatological: No rash, lesions/masses Respiratory: No cough, +++ dyspnea Urologic: No hematuria, dysuria Abdominal:   No nausea, vomiting, diarrhea, bright red blood per rectum, melena, or  hematemesis Neurologic:  No visual changes, wkns, changes in mental status. All other systems reviewed and are otherwise negative except as noted above.  Physical Exam  Height 5\' 3"  (1.6 m), weight 220 lb 12.8 oz (100.154 kg).  General: Pleasant, NAD Psych: Normal affect. Neuro: Alert and oriented X 3. Moves all extremities spontaneously. HEENT: Normal  Neck: Supple without bruits or JVD. Lungs:  Resp regular and unlabored, CTA. Heart: RRR no s3, s4, 2/6  SEM bilat usb. Abdomen: Soft, non-tender, non-distended, BS + x 4.  Extremities: No clubbing, cyanosis or edema. DP/PT/Radials 2+ and equal bilaterally.  Labs  Pending   Radiology/Studies  No results found.  ECG  ST, 1st deg avb, PAC's, no acute st/t changes.  ASSESSMENT AND PLAN  1.  [place]:  Pt presents today secondary to recurrent exertional substernal chest discomfort and dyspnea that occurred again last night while wrapping presents.  She just saw Dr. Mare Ferrari in clinic yesterday 2/2 exertional symptoms and was initially set up for stress testing but in light of recurrent chest pain last night, Dr. Mare Ferrari has advised for admission today and cardiac catheterization.  She is currently chest pain free and we were able to arrange for cath this afternoon.  We are working on getting a tele bed and will directly admit her from clinic.  Cont asa, bb, statin, prn nitrates.  2.  DM:  Hold metformin, add SSI.  3.  HL:  Cont statin.  4.  HTN:  BP elevated this AM. Follow while in hospital.   Signed, Murray Hodgkins, NP 01/03/2014, 9:41 AM

## 2014-01-03 NOTE — Discharge Summary (Signed)
Physician Discharge Summary  Patient ID: [name] [name] MRN: [id number] DOB/AGE: 1930-08-13 80 y.o.   Primary Cardiologist: Dr. Mare Ferrari  Admit date: 01/03/2014 Discharge date: 01/03/2014  Admission Diagnoses: Unstable Angina  Discharge Diagnoses:  Active Problems:   Paroxysmal atrial fibrillation   Non-cardiac chest pain - normal LHC w/ nl EF 12/2013   Discharged Condition: stable  Hospital Course: The patient is a 80 y/o female followed by Dr. Mare Ferrari with a history of HTN, DM and mild aortic stenosis. Her last echocardiogram was 10/18/10 which showed an ejection fraction of 60-65% with mild aortic stenosis with peak gradient of 22 and mean gradient of 11.  She was evaluated by Dr. Mare Ferrari in clinic on 01/02/14. She complained of chest pain concerning for angina. Subsequently, Dr. Mare Ferrari ordered for her to undergo stress testing. However, the patient presented back to the office today complaining of recurrent chest pain overnight that was concerning for unstable angina. She endorsed exertional substernal chest discomfort and dyspnea that occurred again while wrapping Christmas presents. Given the concern for unstable angina, it was decided to admit to [facility] for cardiac catheterization. On arrival, renal function was normal with Scr at 0.9. She was taken to the cath lab for assessment. The procedure was performed by Dr. Gwenlyn Found via the right radial artery. She was found to have angiographically normal coronaries with normal LV function. EF was estimated at 60% w/o WMA. It was belived her CP was noncardiac.  She left the cath lab in stable condition. She was monitored and had no post cath complications. Her radial access site remained stable. There was concern however regarding possible atrial arrhthymia on telemetry. A EKG was obtained and demonstrated SR with frequent PACs. However, given her risk factors for stroke (HTN, DM, Age and female sex), it was recommended that she be further  evaluated with a 30 day event monitor to rule out possible PAF. An order was placed and patient will be fitted for a monitor in our [place]. If evidence of atrial fibrillation, it has been recommended that she be treated with a NOAC. For now, it has been recommended to continue with ASA.   The patient was last seen and examined by Dr. Gwenlyn Found who determined she was stable for discharge home. Post-hospital f/u has been arranged with Dr. Mare Ferrari on 01/19/13.   Consults: None  Significant Diagnostic Studies:   LHC 01/03/14  HEMODYNAMICS:   AO SYSTOLIC/AO DIASTOLIC: 242/35  LV SYSTOLIC/LV DIASTOLIC: 361/44  ANGIOGRAPHIC RESULTS:   1. Left main; normal  2. LAD; normal 3. Left circumflex; normal.  4. Right coronary artery; dominant and normal 5. Left ventriculography; RAO left ventriculogram was performed using  25 mL of Visipaque dye at 12 mL/second. The overall LVEF estimated  60 % Without wall motion abnormalities   Treatments: See Hospital Course  Discharge Exam: Blood pressure 100/74, pulse 89, temperature 98.1 F (36.7 C), temperature source Oral, resp. rate 18, height 5\' 3"  (1.6 m), weight 219 lb 2.2 oz (99.4 kg), SpO2 100 %.   Disposition: 01-Home or Self Care      Discharge Instructions    Diet - low sodium heart healthy    Complete by:  As directed      Diet - low sodium heart healthy    Complete by:  As directed      Discharge instructions    Complete by:  As directed   No driving for 3 days. No lifting more than 1/2 gallon of milk for 3 days.  Wait until 01/06/14 to restart Metformin.     Increase activity slowly    Complete by:  As directed      Increase activity slowly    Complete by:  As directed             Medication List    TAKE these medications        albuterol 108 (90 BASE) MCG/ACT inhaler  Commonly known as:  PROVENTIL HFA;VENTOLIN HFA  Inhale 1 puff into the lungs every 6 (six) hours as needed for wheezing or shortness of  breath.     aspirin 81 MG tablet  Take 81 mg by mouth every Monday, Wednesday, and Friday. Take 1 on Monday, Wednesday, and Friday only     CALCIUM + D PO  Take 1 tablet by mouth 2 (two) times daily.     cholecalciferol 1000 UNITS tablet  Commonly known as:  VITAMIN D  Take 1,000 Units by mouth daily.     furosemide 20 MG tablet  Commonly known as:  LASIX  Take 20 mg by mouth as needed for fluid (Left foot swelling).     gabapentin 300 MG capsule  Commonly known as:  NEURONTIN  Take 900 mg by mouth 3 (three) times daily.     HYDROcodone-acetaminophen 5-325 MG per tablet  Commonly known as:  NORCO/VICODIN  Take 1-2 tablets by mouth daily as needed for moderate pain.     losartan-hydrochlorothiazide 50-12.5 MG per tablet  Commonly known as:  HYZAAR  Take 1 tablet by mouth daily.     metFORMIN 1000 MG tablet  Commonly known as:  GLUCOPHAGE  Take 1 tablet (1,000 mg total) by mouth 2 (two) times daily with a meal.  Start taking on:  01/06/2014     metoprolol succinate 50 MG 24 hr tablet  Commonly known as:  TOPROL-XL  Take 1 tablet (50 mg total) by mouth 2 (two) times daily.     nitroGLYCERIN 0.4 MG SL tablet  Commonly known as:  NITROSTAT  Place 1 tablet (0.4 mg total) under the tongue every 5 (five) minutes as needed for chest pain.     oxybutynin 10 MG 24 hr tablet  Commonly known as:  DITROPAN-XL  Take 10 mg by mouth daily.     pantoprazole 40 MG tablet  Commonly known as:  PROTONIX  Take 40 mg by mouth every morning.     rosuvastatin 5 MG tablet  Commonly known as:  CRESTOR  Take 5 mg by mouth at bedtime.     triamcinolone 0.025 % cream  Commonly known as:  KENALOG  Apply 1 application topically as needed (as needed for leg pain).       Follow-up Information    Follow up with Darlin Coco, MD On 01/19/2014.   Specialty:  Cardiology   Why:  3:15 pm   Contact information:   [address] [place] [address] [phone]       Follow  up with [facility].   Specialty:  Cardiology   Why:  our office will call you with an appointment to pick up heart monitor   Contact information:   [address] [place]: >30 MINUTES   Signed: Lyda Jester 01/03/2014, 5:09 PM

## 2014-01-03 NOTE — Progress Notes (Signed)
Rt radial site un remarkable and dressing appiled. VSS, discharge instructions reviewed with patient. Medication list given and all questions were answered. Will discharge home as ordered. Liller Yohn, Bettina Gavia RN

## 2014-01-03 NOTE — Patient Instructions (Signed)
Your physician recommends that you continue on your current medications as directed. Please refer to the Current Medication list given to you today.   Your physician has requested that you have a cardiac catheterization. Cardiac catheterization is used to diagnose and/or treat various heart conditions. Doctors may recommend this procedure for a number of different reasons. The most common reason is to evaluate chest pain. Chest pain can be a symptom of coronary artery disease (CAD), and cardiac catheterization can show whether plaque is narrowing or blocking your heart's arteries. This procedure is also used to evaluate the valves, as well as measure the blood flow and oxygen levels in different parts of your heart. For further information please visit [email or web address]. Please follow instruction sheet, as given. Today

## 2014-01-03 NOTE — Progress Notes (Signed)
Patient ID: [name] [name] MRN: [id number], DOB/AGE: [birth year]   Admit date: (Not on file)  Primary Physician: Cari Caraway, MD Primary Cardiologist: Vaughan Browner, MD   Pt. Profile:  80 y/o female with a h/o mild aortic stenosis who just saw Dr. Mare Ferrari yesterday 2/2 unstable angina who presents today for visit and elective admission with plan for catheterization.  Problem List  Past Medical History  Diagnosis Date  . Mild aortic stenosis     a. 10/2010 Echo: Ef 60-65%, no rwma, mild LVH no AS.  Marland Kitchen Chronic back pain   . Type II diabetes mellitus   . Peripheral neuropathy   . GERD (gastroesophageal reflux disease)   . Hypertension   . History of pneumonia   . History of bronchitis   . Basal cell carcinoma     a. 2013.  Marland Kitchen Unstable angina   . PAC (premature atrial contraction)     Past Surgical History  Procedure Laterality Date  . Vesicovaginal fistula closure w/ tah  1964  . US echocardiography  04-26-09    EF 55-60%  . Cardiovascular stress test  05-13-2005    EF 67%  . Knee surgery    . Joint replacement  2000    left knee  . Abdominal hysterectomy  1964  . Toe shortened      right 2nd toe  . Cataract extraction      both eyes  . Mass excision  01/13/2012    Procedure: EXCISION MASS;  Surgeon: Ralene Ok, MD;  Location: [facility];  Service: General;  Laterality: Right;  Excision of Right Back Mass     Allergies  Allergies  Allergen Reactions  . Codeine Nausea And Vomiting  . Demerol Other (See Comments)    Drunk feeling"  . Statins Other (See Comments)    "hurt"   . Zetia [Ezetimibe] Other (See Comments)    hurt    HPI  80 y/o female with the above problem list. She does not have a h/o CAD but reports two prior normal stress tests.  She was in her usoh until this past  Friday, when she was walking with her husband and developed severe sscp and dyspnea, lasting ~ 15 mins and resolving with rest.  She had recurrent Ss on both Saturday and Sunday,  lasting between 5 and 15 mins.  She saw Dr. Mare Ferrari yesterday and decision was made to pursue lexiscan stress testing.  Unfortunately, pt developed recurrent chest pain last night while wrapping presents, which resolved within 5 mins after taking 2 sl NTG tabs.  She spoke to the fellow on call and that message was passed on to Dr. Mare Ferrari this AM.  Pt was contacted and advised to come for office visit and arrange for cath.  Currently, pt is c/p free.  She has not had any c/p this AM.  She denies pnd, orthopnea, n, v, dizziness, syncope, edema, weight gain, or early satiety.   Home Medications  Prior to Admission medications   Medication Sig Start Date End Date Taking? Authorizing Provider  albuterol (PROVENTIL HFA;VENTOLIN HFA) 108 (90 BASE) MCG/ACT inhaler Inhale 1 puff into the lungs every 6 (six) hours as needed for wheezing or shortness of breath.   Yes Historical Provider, MD  aspirin 81 MG tablet Take 81 mg by mouth every Monday, Wednesday, and Friday. Take 1 on Monday, Wednesday, and Friday only   Yes Historical Provider, MD  Calcium Carbonate-Vitamin D (CALCIUM + D PO) Take 1 tablet by mouth  2 (two) times daily.    Yes Historical Provider, MD  furosemide (LASIX) 20 MG tablet Take 20 mg by mouth as needed for fluid (Left foot swelling).   Yes Historical Provider, MD  gabapentin (NEURONTIN) 300 MG capsule Take 900 mg by mouth 3 (three) times daily.    Yes Historical Provider, MD  HYDROcodone-acetaminophen (NORCO/VICODIN) 5-325 MG per tablet Take 1-2 tablets by mouth daily as needed for moderate pain. 12/22/13  Yes Charlett Blake, MD  losartan-hydrochlorothiazide (HYZAAR) 50-12.5 MG per tablet Take 1 tablet by mouth daily.  04/08/13  Yes Historical Provider, MD  metFORMIN (GLUCOPHAGE) 1000 MG tablet Take 1,000 mg by mouth 2 (two) times daily with a meal.   Yes Historical Provider, MD  metoprolol succinate (TOPROL-XL) 50 MG 24 hr tablet Take 1 tablet (50 mg total) by mouth 2 (two) times  daily. 01/02/14  Yes Darlin Coco, MD  nitroGLYCERIN (NITROSTAT) 0.4 MG SL tablet Place 1 tablet (0.4 mg total) under the tongue every 5 (five) minutes as needed for chest pain. 01/02/14  Yes Darlin Coco, MD  oxybutynin (DITROPAN-XL) 10 MG 24 hr tablet Take 10 mg by mouth daily.  08/27/12  Yes Historical Provider, MD  pantoprazole (PROTONIX) 40 MG tablet Take 40 mg by mouth every morning.    Yes Historical Provider, MD  Rosuvastatin Calcium (CRESTOR PO) Take 5 mg by mouth at bedtime.    Yes Historical Provider, MD  triamcinolone (KENALOG) 0.025 % cream Apply 1 application topically as needed (as needed for leg pain).  08/08/13  Yes Historical Provider, MD    Family History  Family History  Problem Relation Age of Onset  . Other Mother     died @ 12, complications following childbirth  . COPD Father     died @ 87  . Heart failure Father   . Irregular heart beat Sister     1 sister with PPM.    Social History  History   Social History  . Marital Status: Married    Spouse Name: N/A    Number of Children: N/A  . Years of Education: N/A   Occupational History  . Not on file.   Social History Main Topics  . Smoking status: Former Smoker -- 0.50 packs/day for 5 years    Types: Cigarettes    Quit date: 01/14/1980  . Smokeless tobacco: Never Used  . Alcohol Use: No  . Drug Use: No  . Sexual Activity: Not on file   Other Topics Concern  . Not on file   Social History Narrative   Lives with Husband in [place].  Has a dtr in [place], [place] and a son in [place], [place].     Review of Systems General:  No chills, fever, night sweats or weight changes.  +++ fatigue Cardiovascular:  +++ chest pain, +++ dyspnea on exertion, edema, orthopnea, palpitations, paroxysmal nocturnal dyspnea. Dermatological: No rash, lesions/masses Respiratory: No cough, +++ dyspnea Urologic: No hematuria, dysuria Abdominal:   No nausea, vomiting, diarrhea, bright red blood per rectum, melena, or  hematemesis Neurologic:  No visual changes, wkns, changes in mental status. All other systems reviewed and are otherwise negative except as noted above.  Physical Exam  Height 5\' 3"  (1.6 m), weight 220 lb 12.8 oz (100.154 kg).  General: Pleasant, NAD Psych: Normal affect. Neuro: Alert and oriented X 3. Moves all extremities spontaneously. HEENT: Normal  Neck: Supple without bruits or JVD. Lungs:  Resp regular and unlabored, CTA. Heart: RRR no s3, s4, 2/6  SEM bilat usb. Abdomen: Soft, non-tender, non-distended, BS + x 4.  Extremities: No clubbing, cyanosis or edema. DP/PT/Radials 2+ and equal bilaterally.  Labs  Pending   Radiology/Studies  No results found.  ECG  ST, 1st deg avb, PAC's, no acute st/t changes.  ASSESSMENT AND PLAN  1.  [place]:  Pt presents today secondary to recurrent exertional substernal chest discomfort and dyspnea that occurred again last night while wrapping presents.  She just saw Dr. Mare Ferrari in clinic yesterday 2/2 exertional symptoms and was initially set up for stress testing but in light of recurrent chest pain last night, Dr. Mare Ferrari has advised for admission today and cardiac catheterization.  She is currently chest pain free and we were able to arrange for cath this afternoon.  We are working on getting a tele bed and will directly admit her from clinic.  Cont asa, bb, statin, prn nitrates.  2.  DM:  Hold metformin, add SSI.  3.  HL:  Cont statin.  4.  HTN:  BP elevated this AM. Follow while in hospital.   Signed, Murray Hodgkins, NP 01/03/2014, 9:41 AM

## 2014-01-03 NOTE — Interval H&P Note (Signed)
Cath Lab Visit (complete for each Cath Lab visit)  Clinical Evaluation Leading to the Procedure:   ACS: No.  Non-ACS:    Anginal Classification: CCS IV  Anti-ischemic medical therapy: Minimal Therapy (1 class of medications)  Non-Invasive Test Results: No non-invasive testing performed  Prior CABG: No previous CABG      History and Physical Interval Note:  01/03/2014 2:40 PM  [name] [name]  has presented today for surgery, with the diagnosis of cp  The various methods of treatment have been discussed with the patient and family. After consideration of risks, benefits and other options for treatment, the patient has consented to  Procedure(s): LEFT HEART CATHETERIZATION WITH CORONARY ANGIOGRAM (N/A) as a surgical intervention .  The patient's history has been reviewed, patient examined, no change in status, stable for surgery.  I have reviewed the patient's chart and labs.  Questions were answered to the patient's satisfaction.     Lorretta Harp

## 2014-01-03 NOTE — H&P (View-Only) (Signed)
Patient ID: [name] [name] MRN: [id number], DOB/AGE: [birth year]   Admit date: (Not on file)  Primary Physician: Cari Caraway, MD Primary Cardiologist: Vaughan Browner, MD   Pt. Profile:  80 y/o female with a h/o mild aortic stenosis who just saw Dr. Mare Ferrari yesterday 2/2 unstable angina who presents today for visit and elective admission with plan for catheterization.  Problem List  Past Medical History  Diagnosis Date  . Mild aortic stenosis     a. 10/2010 Echo: Ef 60-65%, no rwma, mild LVH no AS.  Marland Kitchen Chronic back pain   . Type II diabetes mellitus   . Peripheral neuropathy   . GERD (gastroesophageal reflux disease)   . Hypertension   . History of pneumonia   . History of bronchitis   . Basal cell carcinoma     a. 2013.  Marland Kitchen Unstable angina   . PAC (premature atrial contraction)     Past Surgical History  Procedure Laterality Date  . Vesicovaginal fistula closure w/ tah  1964  . US echocardiography  04-26-09    EF 55-60%  . Cardiovascular stress test  05-13-2005    EF 67%  . Knee surgery    . Joint replacement  2000    left knee  . Abdominal hysterectomy  1964  . Toe shortened      right 2nd toe  . Cataract extraction      both eyes  . Mass excision  01/13/2012    Procedure: EXCISION MASS;  Surgeon: Ralene Ok, MD;  Location: [facility];  Service: General;  Laterality: Right;  Excision of Right Back Mass     Allergies  Allergies  Allergen Reactions  . Codeine Nausea And Vomiting  . Demerol Other (See Comments)    Drunk feeling"  . Statins Other (See Comments)    "hurt"   . Zetia [Ezetimibe] Other (See Comments)    hurt    HPI  80 y/o female with the above problem list. She does not have a h/o CAD but reports two prior normal stress tests.  She was in her usoh until this past  Friday, when she was walking with her husband and developed severe sscp and dyspnea, lasting ~ 15 mins and resolving with rest.  She had recurrent Ss on both Saturday and Sunday,  lasting between 5 and 15 mins.  She saw Dr. Mare Ferrari yesterday and decision was made to pursue lexiscan stress testing.  Unfortunately, pt developed recurrent chest pain last night while wrapping presents, which resolved within 5 mins after taking 2 sl NTG tabs.  She spoke to the fellow on call and that message was passed on to Dr. Mare Ferrari this AM.  Pt was contacted and advised to come for office visit and arrange for cath.  Currently, pt is c/p free.  She has not had any c/p this AM.  She denies pnd, orthopnea, n, v, dizziness, syncope, edema, weight gain, or early satiety.   Home Medications  Prior to Admission medications   Medication Sig Start Date End Date Taking? Authorizing Provider  albuterol (PROVENTIL HFA;VENTOLIN HFA) 108 (90 BASE) MCG/ACT inhaler Inhale 1 puff into the lungs every 6 (six) hours as needed for wheezing or shortness of breath.   Yes Historical Provider, MD  aspirin 81 MG tablet Take 81 mg by mouth every Monday, Wednesday, and Friday. Take 1 on Monday, Wednesday, and Friday only   Yes Historical Provider, MD  Calcium Carbonate-Vitamin D (CALCIUM + D PO) Take 1 tablet by mouth  2 (two) times daily.    Yes Historical Provider, MD  furosemide (LASIX) 20 MG tablet Take 20 mg by mouth as needed for fluid (Left foot swelling).   Yes Historical Provider, MD  gabapentin (NEURONTIN) 300 MG capsule Take 900 mg by mouth 3 (three) times daily.    Yes Historical Provider, MD  HYDROcodone-acetaminophen (NORCO/VICODIN) 5-325 MG per tablet Take 1-2 tablets by mouth daily as needed for moderate pain. 12/22/13  Yes Charlett Blake, MD  losartan-hydrochlorothiazide (HYZAAR) 50-12.5 MG per tablet Take 1 tablet by mouth daily.  04/08/13  Yes Historical Provider, MD  metFORMIN (GLUCOPHAGE) 1000 MG tablet Take 1,000 mg by mouth 2 (two) times daily with a meal.   Yes Historical Provider, MD  metoprolol succinate (TOPROL-XL) 50 MG 24 hr tablet Take 1 tablet (50 mg total) by mouth 2 (two) times  daily. 01/02/14  Yes Darlin Coco, MD  nitroGLYCERIN (NITROSTAT) 0.4 MG SL tablet Place 1 tablet (0.4 mg total) under the tongue every 5 (five) minutes as needed for chest pain. 01/02/14  Yes Darlin Coco, MD  oxybutynin (DITROPAN-XL) 10 MG 24 hr tablet Take 10 mg by mouth daily.  08/27/12  Yes Historical Provider, MD  pantoprazole (PROTONIX) 40 MG tablet Take 40 mg by mouth every morning.    Yes Historical Provider, MD  Rosuvastatin Calcium (CRESTOR PO) Take 5 mg by mouth at bedtime.    Yes Historical Provider, MD  triamcinolone (KENALOG) 0.025 % cream Apply 1 application topically as needed (as needed for leg pain).  08/08/13  Yes Historical Provider, MD    Family History  Family History  Problem Relation Age of Onset  . Other Mother     died @ 12, complications following childbirth  . COPD Father     died @ 91  . Heart failure Father   . Irregular heart beat Sister     1 sister with PPM.    Social History  History   Social History  . Marital Status: Married    Spouse Name: N/A    Number of Children: N/A  . Years of Education: N/A   Occupational History  . Not on file.   Social History Main Topics  . Smoking status: Former Smoker -- 0.50 packs/day for 5 years    Types: Cigarettes    Quit date: 01/14/1980  . Smokeless tobacco: Never Used  . Alcohol Use: No  . Drug Use: No  . Sexual Activity: Not on file   Other Topics Concern  . Not on file   Social History Narrative   Lives with Husband in [place].  Has a dtr in [place], [place] and a son in [place], [place].     Review of Systems General:  No chills, fever, night sweats or weight changes.  +++ fatigue Cardiovascular:  +++ chest pain, +++ dyspnea on exertion, edema, orthopnea, palpitations, paroxysmal nocturnal dyspnea. Dermatological: No rash, lesions/masses Respiratory: No cough, +++ dyspnea Urologic: No hematuria, dysuria Abdominal:   No nausea, vomiting, diarrhea, bright red blood per rectum, melena, or  hematemesis Neurologic:  No visual changes, wkns, changes in mental status. All other systems reviewed and are otherwise negative except as noted above.  Physical Exam  Height 5\' 3"  (1.6 m), weight 220 lb 12.8 oz (100.154 kg).  General: Pleasant, NAD Psych: Normal affect. Neuro: Alert and oriented X 3. Moves all extremities spontaneously. HEENT: Normal  Neck: Supple without bruits or JVD. Lungs:  Resp regular and unlabored, CTA. Heart: RRR no s3, s4, 2/6  SEM bilat usb. Abdomen: Soft, non-tender, non-distended, BS + x 4.  Extremities: No clubbing, cyanosis or edema. DP/PT/Radials 2+ and equal bilaterally.  Labs  Pending   Radiology/Studies  No results found.  ECG  ST, 1st deg avb, PAC's, no acute st/t changes.  ASSESSMENT AND PLAN  1.  [place]:  Pt presents today secondary to recurrent exertional substernal chest discomfort and dyspnea that occurred again last night while wrapping presents.  She just saw Dr. Mare Ferrari in clinic yesterday 2/2 exertional symptoms and was initially set up for stress testing but in light of recurrent chest pain last night, Dr. Mare Ferrari has advised for admission today and cardiac catheterization.  She is currently chest pain free and we were able to arrange for cath this afternoon.  We are working on getting a tele bed and will directly admit her from clinic.  Cont asa, bb, statin, prn nitrates.  2.  DM:  Hold metformin, add SSI.  3.  HL:  Cont statin.  4.  HTN:  BP elevated this AM. Follow while in hospital.   Signed, Murray Hodgkins, NP 01/03/2014, 9:41 AM

## 2014-01-04 ENCOUNTER — Telehealth: Payer: Self-pay | Admitting: *Deleted

## 2014-01-04 ENCOUNTER — Ambulatory Visit (INDEPENDENT_AMBULATORY_CARE_PROVIDER_SITE_OTHER): Payer: Commercial Managed Care - HMO | Admitting: *Deleted

## 2014-01-04 ENCOUNTER — Encounter: Payer: Self-pay | Admitting: *Deleted

## 2014-01-04 ENCOUNTER — Encounter: Payer: Self-pay | Admitting: Cardiology

## 2014-01-04 DIAGNOSIS — I480 Paroxysmal atrial fibrillation: Secondary | ICD-10-CM

## 2014-01-04 NOTE — Telephone Encounter (Signed)
lvm w/pt's husband lab results were in and looks ok. I said she could cb if any questions [phone]. Husband said ok and thank you very much.

## 2014-01-04 NOTE — Progress Notes (Signed)
Patient ID: [name] [name], female   DOB: 15-Feb-1930, 80 y.o.   MRN: [id number] [employer] verite 30 day cardiac event monitor applied to patient.

## 2014-01-09 ENCOUNTER — Encounter (HOSPITAL_COMMUNITY): Payer: Self-pay | Admitting: Emergency Medicine

## 2014-01-09 ENCOUNTER — Emergency Department (HOSPITAL_COMMUNITY): Payer: Medicare HMO

## 2014-01-09 ENCOUNTER — Inpatient Hospital Stay (HOSPITAL_COMMUNITY)
Admission: EM | Admit: 2014-01-09 | Discharge: 2014-01-12 | DRG: 203 | Disposition: A | Discharge status: Home / Self Care | Payer: Medicare HMO | Attending: Internal Medicine | Admitting: Internal Medicine

## 2014-01-09 DIAGNOSIS — I480 Paroxysmal atrial fibrillation: Secondary | ICD-10-CM | POA: Diagnosis present

## 2014-01-09 DIAGNOSIS — Z886 Allergy status to analgesic agent status: Secondary | ICD-10-CM

## 2014-01-09 DIAGNOSIS — Z90710 Acquired absence of both cervix and uterus: Secondary | ICD-10-CM

## 2014-01-09 DIAGNOSIS — I491 Atrial premature depolarization: Secondary | ICD-10-CM | POA: Diagnosis present

## 2014-01-09 DIAGNOSIS — G8929 Other chronic pain: Secondary | ICD-10-CM | POA: Diagnosis present

## 2014-01-09 DIAGNOSIS — Z9842 Cataract extraction status, left eye: Secondary | ICD-10-CM | POA: Diagnosis not present

## 2014-01-09 DIAGNOSIS — R0902 Hypoxemia: Secondary | ICD-10-CM | POA: Diagnosis present

## 2014-01-09 DIAGNOSIS — Z7982 Long term (current) use of aspirin: Secondary | ICD-10-CM | POA: Diagnosis not present

## 2014-01-09 DIAGNOSIS — Z96652 Presence of left artificial knee joint: Secondary | ICD-10-CM | POA: Diagnosis present

## 2014-01-09 DIAGNOSIS — Z87891 Personal history of nicotine dependence: Secondary | ICD-10-CM | POA: Diagnosis not present

## 2014-01-09 DIAGNOSIS — I119 Hypertensive heart disease without heart failure: Secondary | ICD-10-CM | POA: Diagnosis present

## 2014-01-09 DIAGNOSIS — E119 Type 2 diabetes mellitus without complications: Secondary | ICD-10-CM | POA: Diagnosis present

## 2014-01-09 DIAGNOSIS — K219 Gastro-esophageal reflux disease without esophagitis: Secondary | ICD-10-CM | POA: Diagnosis present

## 2014-01-09 DIAGNOSIS — G629 Polyneuropathy, unspecified: Secondary | ICD-10-CM | POA: Diagnosis present

## 2014-01-09 DIAGNOSIS — I350 Nonrheumatic aortic (valve) stenosis: Secondary | ICD-10-CM | POA: Diagnosis present

## 2014-01-09 DIAGNOSIS — Z888 Allergy status to other drugs, medicaments and biological substances status: Secondary | ICD-10-CM | POA: Diagnosis not present

## 2014-01-09 DIAGNOSIS — Z9841 Cataract extraction status, right eye: Secondary | ICD-10-CM | POA: Diagnosis not present

## 2014-01-09 DIAGNOSIS — R0602 Shortness of breath: Secondary | ICD-10-CM | POA: Diagnosis present

## 2014-01-09 DIAGNOSIS — E118 Type 2 diabetes mellitus with unspecified complications: Secondary | ICD-10-CM

## 2014-01-09 DIAGNOSIS — Z8701 Personal history of pneumonia (recurrent): Secondary | ICD-10-CM

## 2014-01-09 DIAGNOSIS — Z885 Allergy status to narcotic agent status: Secondary | ICD-10-CM | POA: Diagnosis not present

## 2014-01-09 DIAGNOSIS — J209 Acute bronchitis, unspecified: Secondary | ICD-10-CM | POA: Diagnosis not present

## 2014-01-09 DIAGNOSIS — R000 Tachycardia, unspecified: Secondary | ICD-10-CM | POA: Diagnosis present

## 2014-01-09 DIAGNOSIS — Z85828 Personal history of other malignant neoplasm of skin: Secondary | ICD-10-CM | POA: Diagnosis not present

## 2014-01-09 DIAGNOSIS — R400 Somnolence: Secondary | ICD-10-CM | POA: Diagnosis not present

## 2014-01-09 LAB — BASIC METABOLIC PANEL
Anion gap: 12 (ref 5–15)
BUN: 22 mg/dL (ref 6–23)
CHLORIDE: 99 meq/L (ref 96–112)
CO2: 29 mmol/L (ref 19–32)
CREATININE: 1.02 mg/dL (ref 0.50–1.10)
Calcium: 8.7 mg/dL (ref 8.4–10.5)
GFR calc Af Amer: 58 mL/min — ABNORMAL LOW (ref >90)
GFR calc non Af Amer: 50 mL/min — ABNORMAL LOW (ref >90)
GLUCOSE: 176 mg/dL — AB (ref 70–99)
POTASSIUM: 3.9 mmol/L (ref 3.5–5.1)
Sodium: 140 mmol/L (ref 135–145)

## 2014-01-09 LAB — CBC WITH DIFFERENTIAL/PLATELET
Basophils Absolute: 0 10*3/uL (ref 0.0–0.1)
Basophils Relative: 0 % (ref 0–1)
EOS ABS: 0.2 10*3/uL (ref 0.0–0.7)
Eosinophils Relative: 1 % (ref 0–5)
HCT: 33.8 % — ABNORMAL LOW (ref 36.0–46.0)
HEMOGLOBIN: 10.3 g/dL — AB (ref 12.0–15.0)
LYMPHS ABS: 1.7 10*3/uL (ref 0.7–4.0)
Lymphocytes Relative: 12 % (ref 12–46)
MCH: 30.2 pg (ref 26.0–34.0)
MCHC: 30.5 g/dL (ref 30.0–36.0)
MCV: 99.1 fL (ref 78.0–100.0)
Monocytes Absolute: 1.1 10*3/uL — ABNORMAL HIGH (ref 0.1–1.0)
Monocytes Relative: 8 % (ref 3–12)
NEUTROS ABS: 11.3 10*3/uL — AB (ref 1.7–7.7)
NEUTROS PCT: 79 % — AB (ref 43–77)
Platelets: 225 10*3/uL (ref 150–400)
RBC: 3.41 MIL/uL — ABNORMAL LOW (ref 3.87–5.11)
RDW: 13.4 % (ref 11.5–15.5)
WBC: 14.4 10*3/uL — ABNORMAL HIGH (ref 4.0–10.5)

## 2014-01-09 LAB — BRAIN NATRIURETIC PEPTIDE: B NATRIURETIC PEPTIDE 5: 115.8 pg/mL — AB (ref 0.0–100.0)

## 2014-01-09 LAB — TROPONIN I: Troponin I: <0.03 ng/mL (ref <0.031)

## 2014-01-09 MED ORDER — IPRATROPIUM-ALBUTEROL 0.5-2.5 (3) MG/3ML IN SOLN
3.0000 mL | Freq: Once | RESPIRATORY_TRACT | Status: AC
  Administered 2014-01-09: 3 mL via RESPIRATORY_TRACT
  Filled 2014-01-09: qty 3

## 2014-01-09 MED ORDER — ALBUTEROL SULFATE (2.5 MG/3ML) 0.083% IN NEBU
5.0000 mg | INHALATION_SOLUTION | Freq: Once | RESPIRATORY_TRACT | Status: AC
  Administered 2014-01-09: 5 mg via RESPIRATORY_TRACT
  Filled 2014-01-09: qty 6

## 2014-01-09 MED ORDER — METHYLPREDNISOLONE SODIUM SUCC 125 MG IJ SOLR
80.0000 mg | Freq: Once | INTRAMUSCULAR | Status: AC
  Administered 2014-01-09: 80 mg via INTRAVENOUS
  Filled 2014-01-09: qty 2

## 2014-01-09 NOTE — ED Notes (Signed)
The patient said she started with cough and congestion since thursday.  The patient also had fever this morning of 100.0 she is afebrile now.  The patient did have a heart catheterization done on Tuesday and has a heart monitor on her today.  Today she presents with cough, congestion and low oxygen levels.  She did go see her PCP for this and they sent her here because her oxygen levels were in the high 80's.  We put her on 2L/[place] due to her O2 levels being in the 80's.  She denies SOB, or chest pain.

## 2014-01-09 NOTE — H&P (Signed)
[facility] History and Physical  Patient: [name] [name]  YHC:[id number]  DOB: April 01, 1930  DOS: the patient was seen and examined on 01/09/2014 PCP: Cari Caraway, MD  Chief Complaint: Shortness of breath with cough and fever  HPI: [name] [name] is a 80 y.o. female with Past medical history of GERD, noncardiac chest pain, PA-C, diabetes mellitus, hypertension, peripheral neuropathy. The patient presented with numbness of cough and shortness of breath. She mentions that she has shortness of breath along with cough ongoing for last 1 week. Along with that she also had some chest pain which was feeling like chest tightness across her chest. For this complaint she underwent coronary angiography on 01/03/2014 which was not showing any significant stenosis. She continues to have complaints of chest pain as well as shortness of breath. Later on started having congestion with fever since last 2 days. For further workup she was seen by her PCP PA today and was found to have hypoxia with oxygen and 86 and therefore was recommended to go to the ER for further workup. Patient mentions that she gets bronchitis frequently and a lot of time requires oxygen because of the same. She denies any sick contacts, recent travel. The procedure was done with conscious sedation without any tenderness today she had. She denies any rash anywhere. She denies any changes in her medications. She denies any prior history of asthma denies any significant history of smoking. She denies any prior diagnosis of COPD.  The patient is coming from home. And at her baseline independent for most of her ADL.  Review of Systems: as mentioned in the history of present illness.  A Comprehensive review of the other systems is negative.  Past Medical History  Diagnosis Date  . Mild aortic stenosis     a. 10/2010 Echo: Ef 60-65%, no rwma, mild LVH no AS.  Marland Kitchen Chronic back pain   . Peripheral neuropathy   . GERD (gastroesophageal  reflux disease)   . Hypertension   . History of pneumonia   . History of bronchitis   . Basal cell carcinoma     a. 2013.  . Non-cardiac chest pain     LHC w/ nl coronaries and nl EF 60% -12/2013  . PAC (premature atrial contraction)   . Heart murmur   . Type II diabetes mellitus     TYPE 2   Past Surgical History  Procedure Laterality Date  . Vesicovaginal fistula closure w/ tah  1964  . US echocardiography  04-26-09    EF 55-60%  . Cardiovascular stress test  05-13-2005    EF 67%  . Knee surgery    . Joint replacement  2000    left knee  . Abdominal hysterectomy  1964  . Toe shortened      right 2nd toe  . Cataract extraction      both eyes  . Mass excision  01/13/2012    Procedure: EXCISION MASS;  Surgeon: Ralene Ok, MD;  Location: [facility];  Service: General;  Laterality: Right;  Excision of Right Back Mass  . Left heart catheterization with coronary angiogram N/A 01/03/2014    Procedure: LEFT HEART CATHETERIZATION WITH CORONARY ANGIOGRAM;  Surgeon: Lorretta Harp, MD;  Location: [facility];  Service: Cardiovascular;  Laterality: N/A;   Social History:  reports that she quit smoking about 34 years ago. Her smoking use included Cigarettes. She has a 2.5 pack-year smoking history. She has never used smokeless tobacco. She reports that she does not drink  alcohol or use illicit drugs.  Allergies  Allergen Reactions  . Codeine Nausea And Vomiting  . Demerol Other (See Comments)    Drunk feeling"  . Statins Other (See Comments)    "hurt"   . Zetia [Ezetimibe] Other (See Comments)    hurt    Family History  Problem Relation Age of Onset  . Other Mother     died @ 50, complications following childbirth  . COPD Father     died @ 76  . Heart failure Father   . Irregular heart beat Sister     1 sister with PPM.    Prior to Admission medications   Medication Sig Start Date End Date Taking? Authorizing Provider  albuterol (PROVENTIL HFA;VENTOLIN HFA) 108 (90  BASE) MCG/ACT inhaler Inhale 1 puff into the lungs every 6 (six) hours as needed for wheezing or shortness of breath.   Yes Historical Provider, MD  aspirin 81 MG tablet Take 81 mg by mouth every Monday, Wednesday, and Friday. Take 1 on Monday, Wednesday, and Friday only   Yes Historical Provider, MD  Calcium Carbonate-Vitamin D (CALCIUM + D PO) Take 1 tablet by mouth 2 (two) times daily.    Yes Historical Provider, MD  cholecalciferol (VITAMIN D) 1000 UNITS tablet Take 1,000 Units by mouth daily.   Yes Historical Provider, MD  furosemide (LASIX) 20 MG tablet Take 20 mg by mouth as needed for fluid (Left foot swelling).   Yes Historical Provider, MD  gabapentin (NEURONTIN) 300 MG capsule Take 900 mg by mouth 3 (three) times daily.    Yes Historical Provider, MD  HYDROcodone-acetaminophen (NORCO/VICODIN) 5-325 MG per tablet Take 1-2 tablets by mouth daily as needed for moderate pain. 12/22/13  Yes Charlett Blake, MD  losartan-hydrochlorothiazide (HYZAAR) 50-12.5 MG per tablet Take 1 tablet by mouth daily.  04/08/13  Yes Historical Provider, MD  metFORMIN (GLUCOPHAGE) 1000 MG tablet Take 1 tablet (1,000 mg total) by mouth 2 (two) times daily with a meal. 01/06/14  Yes Brittainy Erie Noe, PA-C  metoprolol succinate (TOPROL-XL) 50 MG 24 hr tablet Take 1 tablet (50 mg total) by mouth 2 (two) times daily. 01/02/14  Yes Darlin Coco, MD  nitroGLYCERIN (NITROSTAT) 0.4 MG SL tablet Place 1 tablet (0.4 mg total) under the tongue every 5 (five) minutes as needed for chest pain. 01/02/14  Yes Darlin Coco, MD  oxybutynin (DITROPAN-XL) 10 MG 24 hr tablet Take 10 mg by mouth daily.  08/27/12  Yes Historical Provider, MD  pantoprazole (PROTONIX) 40 MG tablet Take 40 mg by mouth every morning.    Yes Historical Provider, MD  rosuvastatin (CRESTOR) 5 MG tablet Take 5 mg by mouth at bedtime.   Yes Historical Provider, MD  triamcinolone (KENALOG) 0.025 % cream Apply 1 application topically as needed (as needed  for leg pain).  08/08/13  Yes Historical Provider, MD    Physical Exam: Filed Vitals:   01/09/14 2044 01/09/14 2045 01/09/14 2200 01/09/14 2230  BP: 126/65 141/36 122/51 118/53  Pulse: 117 83 119   Temp:      TempSrc:      Resp: 26 19 25 29   SpO2: 97% 99% 100%     General: Alert, Awake and Oriented to Time, Place and Person. Appear in mild distress Eyes: PERRL ENT: Oral Mucosa clear moist. Neck: no JVD Cardiovascular: S1 and S2 Present, no Murmur, Peripheral Pulses Present Respiratory: Bilateral Air entry equal and Decreased, no Crackles, bilateral expiratory wheezes Abdomen: Bowel Sound present, Soft and  non tender Skin: no Rash Extremities: Trace Pedal edema, no calf tenderness Neurologic: Grossly no focal neuro deficit.  Labs on Admission:  CBC:  Recent Labs Lab 01/03/14 1200 01/09/14 1938  WBC 8.5 14.4*  NEUTROABS 6.2 11.3*  HGB 10.8* 10.3*  HCT 34.5* 33.8*  MCV 98.0 99.1  PLT 222 225    CMP     Component Value Date/Time   NA 140 01/09/2014 1938   K 3.9 01/09/2014 1938   CL 99 01/09/2014 1938   CO2 29 01/09/2014 1938   GLUCOSE 176* 01/09/2014 1938   BUN 22 01/09/2014 1938   CREATININE 1.02 01/09/2014 1938   CALCIUM 8.7 01/09/2014 1938   PROT 6.4 01/03/2014 1200   ALBUMIN 3.6 01/03/2014 1200   AST 17 01/03/2014 1200   ALT 11 01/03/2014 1200   ALKPHOS 71 01/03/2014 1200   BILITOT 0.4 01/03/2014 1200   GFRNONAA 50* 01/09/2014 1938   GFRAA 58* 01/09/2014 1938    No results for input(s): LIPASE, AMYLASE in the last 168 hours. No results for input(s): AMMONIA in the last 168 hours.   Recent Labs Lab 01/03/14 1200 01/09/14 1938  TROPONINI <0.03 <0.03   BNP (last 3 results) No results for input(s): PROBNP in the last 8760 hours.  Radiological Exams on Admission: Dg Chest 2 View (if Patient Has Fever And/or Copd)  01/09/2014   CLINICAL DATA:  Cough and congestion.  Fever.  EXAM: CHEST  2 VIEW  COMPARISON:  05/05/2013  FINDINGS: Multiple leads  overlie the patient. Stable cardiac and mediastinal contours. No large consolidative pulmonary opacities. No pleural effusion or pneumothorax. Mid thoracic spine degenerative change.  IMPRESSION: No acute cardiopulmonary process.   Electronically Signed   By: Lovey Newcomer M.D.   On: 01/09/2014 19:18    EKG: Independently reviewed. sinus tachycardia, PAC's noted.  Assessment/Plan Principal Problem:   Acute bronchitis with bronchospasm Active Problems:   Benign hypertensive heart disease without heart failure   PAC (premature atrial contraction)   Type II diabetes mellitus   Paroxysmal atrial fibrillation   1. Acute bronchitis with bronchospasm The patient is presenting with complaints of cough shortness of breath and fever. Her chest x-ray does not show any acute pneumonia. She does not have any prior history of asthma or COPD. Doesn't she currently appears to have acute bronchitis versus acute bronchiolitis. With this patient is hypoxic and requiring oxygen and therefore she will be admitted in the hospital. I would treat her with levofloxacin, D.O. nebs, Solu-Medrol, Mucinex. I would check her cultures and will follow them. She'll be monitored on telemetry.  2. Sinus tachycardia with PACs. Possible the cause of patient's recurrent chest pain. Also she mentions her chest pain improves with nitroglycerin and therefore I would place her on nitroglycerin ointment and if that improves her pain then she should be on Imdur.  3. essential hypertension. Continue home medication.  4. diabetes mellitus. Placing the patient on sliding scale.  Advance goals of care discussion:  Full code   DVT Prophylaxis: subcutaneous Heparin Nutrition: npo  Family Communication:  Family was present at bedside, opportunity was given to ask question and all questions were answered satisfactorily at the time of interview. Disposition: Admitted to inpatient in telemetry unit.  Author: Berle Mull,  MD [facility] Pager: [phone] 01/09/2014, 11:28 PM    If 7PM-7AM, please contact night-coverage [email or web address] Password [email or web address]

## 2014-01-09 NOTE — ED Notes (Signed)
Unable to ambulate pt in hall due to decrease in O2 sats when on RA. Dr. Betsey Holiday made aware.

## 2014-01-09 NOTE — ED Provider Notes (Signed)
CSN: [id number]     Arrival date & time 01/09/14  1740 History   First MD Initiated Contact with Patient 01/09/14 1904     Chief Complaint  Patient presents with  . Cough    The patient has had cough and nasal congestion since Thursday.  . Fever  . Nasal Congestion     (Consider location/radiation/quality/duration/timing/severity/associated sxs/prior Treatment) HPI Comments: Patient presents to the ER for evaluation of cough and congestion. She thinks that she has had a fever. Patient reports that symptoms began 4 days ago. She has had progressively worsening symptoms since they began. Patient does not have a history of COPD or asthma, but reports that she does have a history of having fairly severe bronchitis episodes. Patient reports that her husband is home with similar upper respiratory symptoms. She is not experiencing any chest pain.  Patient is a 80 y.o. female presenting with cough and fever.  Cough Associated symptoms: fever   Fever Associated symptoms: congestion and cough     Past Medical History  Diagnosis Date  . Mild aortic stenosis     a. 10/2010 Echo: Ef 60-65%, no rwma, mild LVH no AS.  Marland Kitchen Chronic back pain   . Peripheral neuropathy   . GERD (gastroesophageal reflux disease)   . Hypertension   . History of pneumonia   . History of bronchitis   . Basal cell carcinoma     a. 2013.  . Non-cardiac chest pain     LHC w/ nl coronaries and nl EF 60% -12/2013  . PAC (premature atrial contraction)   . Heart murmur   . Type II diabetes mellitus     TYPE 2   Past Surgical History  Procedure Laterality Date  . Vesicovaginal fistula closure w/ tah  1964  . US echocardiography  04-26-09    EF 55-60%  . Cardiovascular stress test  05-13-2005    EF 67%  . Knee surgery    . Joint replacement  2000    left knee  . Abdominal hysterectomy  1964  . Toe shortened      right 2nd toe  . Cataract extraction      both eyes  . Mass excision  01/13/2012    Procedure:  EXCISION MASS;  Surgeon: Ralene Ok, MD;  Location: [facility];  Service: General;  Laterality: Right;  Excision of Right Back Mass  . Left heart catheterization with coronary angiogram N/A 01/03/2014    Procedure: LEFT HEART CATHETERIZATION WITH CORONARY ANGIOGRAM;  Surgeon: Lorretta Harp, MD;  Location: [facility] CATH LAB;  Service: Cardiovascular;  Laterality: N/A;   Family History  Problem Relation Age of Onset  . Other Mother     died @ 49, complications following childbirth  . COPD Father     died @ 60  . Heart failure Father   . Irregular heart beat Sister     1 sister with PPM.   History  Substance Use Topics  . Smoking status: Former Smoker -- 0.50 packs/day for 5 years    Types: Cigarettes    Quit date: 01/14/1980  . Smokeless tobacco: Never Used  . Alcohol Use: No   OB History    No data available     Review of Systems  Constitutional: Positive for fever.  HENT: Positive for congestion.   Respiratory: Positive for cough.   All other systems reviewed and are negative.     Allergies  Codeine; Demerol; Statins; and Zetia  Home Medications  Prior to Admission medications   Medication Sig Start Date End Date Taking? Authorizing Provider  albuterol (PROVENTIL HFA;VENTOLIN HFA) 108 (90 BASE) MCG/ACT inhaler Inhale 1 puff into the lungs every 6 (six) hours as needed for wheezing or shortness of breath.   Yes Historical Provider, MD  aspirin 81 MG tablet Take 81 mg by mouth every Monday, Wednesday, and Friday. Take 1 on Monday, Wednesday, and Friday only   Yes Historical Provider, MD  Calcium Carbonate-Vitamin D (CALCIUM + D PO) Take 1 tablet by mouth 2 (two) times daily.    Yes Historical Provider, MD  cholecalciferol (VITAMIN D) 1000 UNITS tablet Take 1,000 Units by mouth daily.   Yes Historical Provider, MD  furosemide (LASIX) 20 MG tablet Take 20 mg by mouth as needed for fluid (Left foot swelling).   Yes Historical Provider, MD  gabapentin (NEURONTIN) 300 MG  capsule Take 900 mg by mouth 3 (three) times daily.    Yes Historical Provider, MD  HYDROcodone-acetaminophen (NORCO/VICODIN) 5-325 MG per tablet Take 1-2 tablets by mouth daily as needed for moderate pain. 12/22/13  Yes Charlett Blake, MD  losartan-hydrochlorothiazide (HYZAAR) 50-12.5 MG per tablet Take 1 tablet by mouth daily.  04/08/13  Yes Historical Provider, MD  metFORMIN (GLUCOPHAGE) 1000 MG tablet Take 1 tablet (1,000 mg total) by mouth 2 (two) times daily with a meal. 01/06/14  Yes Brittainy Erie Noe, PA-C  metoprolol succinate (TOPROL-XL) 50 MG 24 hr tablet Take 1 tablet (50 mg total) by mouth 2 (two) times daily. 01/02/14  Yes Darlin Coco, MD  nitroGLYCERIN (NITROSTAT) 0.4 MG SL tablet Place 1 tablet (0.4 mg total) under the tongue every 5 (five) minutes as needed for chest pain. 01/02/14  Yes Darlin Coco, MD  oxybutynin (DITROPAN-XL) 10 MG 24 hr tablet Take 10 mg by mouth daily.  08/27/12  Yes Historical Provider, MD  pantoprazole (PROTONIX) 40 MG tablet Take 40 mg by mouth every morning.    Yes Historical Provider, MD  rosuvastatin (CRESTOR) 5 MG tablet Take 5 mg by mouth at bedtime.   Yes Historical Provider, MD  triamcinolone (KENALOG) 0.025 % cream Apply 1 application topically as needed (as needed for leg pain).  08/08/13  Yes Historical Provider, MD   BP 141/36 mmHg  Pulse 83  Temp(Src) 97.8 F (36.6 C) (Oral)  Resp 19  SpO2 99% Physical Exam  Constitutional: She is oriented to person, place, and time. She appears well-developed and well-nourished. No distress.  HENT:  Head: Normocephalic and atraumatic.  Right Ear: Hearing normal.  Left Ear: Hearing normal.  Nose: Nose normal.  Mouth/Throat: Oropharynx is clear and moist and mucous membranes are normal.  Eyes: Conjunctivae and EOM are normal. Pupils are equal, round, and reactive to light.  Neck: Normal range of motion. Neck supple.  Cardiovascular: Regular rhythm, S1 normal and S2 normal.  Exam reveals no  gallop and no friction rub.   No murmur heard. Pulmonary/Chest: Effort normal. Tachypnea noted. No respiratory distress. She has decreased breath sounds. She has wheezes. She exhibits no tenderness.  Abdominal: Soft. Normal appearance and bowel sounds are normal. There is no hepatosplenomegaly. There is no tenderness. There is no rebound, no guarding, no tenderness at McBurney's point and negative Murphy's sign. No hernia.  Musculoskeletal: Normal range of motion.  Neurological: She is alert and oriented to person, place, and time. She has normal strength. No cranial nerve deficit or sensory deficit. Coordination normal. GCS eye subscore is 4. GCS verbal subscore is 5. GCS  motor subscore is 6.  Skin: Skin is warm, dry and intact. No rash noted. No cyanosis.  Psychiatric: She has a normal mood and affect. Her speech is normal and behavior is normal. Thought content normal.  Nursing note and vitals reviewed.   ED Course  Procedures (including critical care time) Labs Review Labs Reviewed  CBC WITH DIFFERENTIAL - Abnormal; Notable for the following:    WBC 14.4 (*)    RBC 3.41 (*)    Hemoglobin 10.3 (*)    HCT 33.8 (*)    Neutrophils Relative % 79 (*)    Neutro Abs 11.3 (*)    Monocytes Absolute 1.1 (*)    All other components within normal limits  BASIC METABOLIC PANEL - Abnormal; Notable for the following:    Glucose, Bld 176 (*)    GFR calc non Af Amer 50 (*)    GFR calc Af Amer 58 (*)    All other components within normal limits  BRAIN NATRIURETIC PEPTIDE - Abnormal; Notable for the following:    B Natriuretic Peptide 115.8 (*)    All other components within normal limits  TROPONIN I    Imaging Review Dg Chest 2 View (if Patient Has Fever And/or Copd)  01/09/2014   CLINICAL DATA:  Cough and congestion.  Fever.  EXAM: CHEST  2 VIEW  COMPARISON:  05/05/2013  FINDINGS: Multiple leads overlie the patient. Stable cardiac and mediastinal contours. No large consolidative pulmonary  opacities. No pleural effusion or pneumothorax. Mid thoracic spine degenerative change.  IMPRESSION: No acute cardiopulmonary process.   Electronically Signed   By: Lovey Newcomer M.D.   On: 01/09/2014 19:18     EKG Interpretation None      MDM   Final diagnoses:  None   bronchitis with bronchospasm  Patient presents to the ER for evaluation of cough and shortness of breath. Patient does have evidence of moderate bronchospasm on arrival. She reported that she felt much better after a nebulizer treatment, however, she still had wheezing and mild tachypnea. After treatment, patient had significant hypoxia with minimal exertion. She desated into the low 80s when she stood up at the bedside. Patient will require hospitalization for further management.    Orpah Greek, MD 01/10/14 [phone]

## 2014-01-09 NOTE — ED Notes (Signed)
Pt sats decreased to low 80s on RA; pt placed on 2 L/M  O2 with sats only increasing to 85%; pt sats increased to low 90s with 4 L/M O2

## 2014-01-10 ENCOUNTER — Encounter (HOSPITAL_COMMUNITY): Payer: Medicare HMO

## 2014-01-10 ENCOUNTER — Encounter (HOSPITAL_COMMUNITY): Payer: Self-pay | Admitting: *Deleted

## 2014-01-10 LAB — COMPREHENSIVE METABOLIC PANEL
ALBUMIN: 3.4 g/dL — AB (ref 3.5–5.2)
ALT: 11 U/L (ref 0–35)
AST: 21 U/L (ref 0–37)
Alkaline Phosphatase: 84 U/L (ref 39–117)
Anion gap: 13 (ref 5–15)
BUN: 24 mg/dL — ABNORMAL HIGH (ref 6–23)
CO2: 27 mmol/L (ref 19–32)
Calcium: 8.4 mg/dL (ref 8.4–10.5)
Chloride: 95 mEq/L — ABNORMAL LOW (ref 96–112)
Creatinine, Ser: 1.02 mg/dL (ref 0.50–1.10)
GFR calc Af Amer: 58 mL/min — ABNORMAL LOW (ref >90)
GFR calc non Af Amer: 50 mL/min — ABNORMAL LOW (ref >90)
Glucose, Bld: 312 mg/dL — ABNORMAL HIGH (ref 70–99)
Potassium: 5.1 mmol/L (ref 3.5–5.1)
SODIUM: 135 mmol/L (ref 135–145)
Total Bilirubin: 0.4 mg/dL (ref 0.3–1.2)
Total Protein: 7 g/dL (ref 6.0–8.3)

## 2014-01-10 LAB — CBC WITH DIFFERENTIAL/PLATELET
BASOS ABS: 0 10*3/uL (ref 0.0–0.1)
Basophils Relative: 0 % (ref 0–1)
EOS PCT: 0 % (ref 0–5)
Eosinophils Absolute: 0 10*3/uL (ref 0.0–0.7)
HCT: 34 % — ABNORMAL LOW (ref 36.0–46.0)
Hemoglobin: 10.5 g/dL — ABNORMAL LOW (ref 12.0–15.0)
LYMPHS PCT: 3 % — AB (ref 12–46)
Lymphs Abs: 0.5 10*3/uL — ABNORMAL LOW (ref 0.7–4.0)
MCH: 31.5 pg (ref 26.0–34.0)
MCHC: 30.9 g/dL (ref 30.0–36.0)
MCV: 102.1 fL — ABNORMAL HIGH (ref 78.0–100.0)
Monocytes Absolute: 0.2 10*3/uL (ref 0.1–1.0)
Monocytes Relative: 1 % — ABNORMAL LOW (ref 3–12)
Neutro Abs: 15.3 10*3/uL — ABNORMAL HIGH (ref 1.7–7.7)
Neutrophils Relative %: 96 % — ABNORMAL HIGH (ref 43–77)
PLATELETS: 209 10*3/uL (ref 150–400)
RBC: 3.33 MIL/uL — ABNORMAL LOW (ref 3.87–5.11)
RDW: 13.2 % (ref 11.5–15.5)
WBC: 16 10*3/uL — ABNORMAL HIGH (ref 4.0–10.5)

## 2014-01-10 LAB — PROTIME-INR
INR: 1.11 (ref 0.00–1.49)
Prothrombin Time: 14.4 seconds (ref 11.6–15.2)

## 2014-01-10 LAB — INFLUENZA PANEL BY PCR (TYPE A & B)
H1N1 flu by pcr: NOT DETECTED
Influenza A By PCR: NEGATIVE
Influenza B By PCR: NEGATIVE

## 2014-01-10 LAB — RAPID STREP SCREEN (MED CTR MEBANE ONLY): Streptococcus, Group A Screen (Direct): NEGATIVE

## 2014-01-10 LAB — GLUCOSE, CAPILLARY: Glucose-Capillary: 187 mg/dL — ABNORMAL HIGH (ref 70–99)

## 2014-01-10 MED ORDER — LOSARTAN POTASSIUM 50 MG PO TABS
50.0000 mg | ORAL_TABLET | Freq: Every day | ORAL | Status: DC
Start: 2014-01-10 — End: 2014-01-12
  Administered 2014-01-10 – 2014-01-12 (×3): 50 mg via ORAL
  Filled 2014-01-10 (×3): qty 1

## 2014-01-10 MED ORDER — ENOXAPARIN SODIUM 40 MG/0.4ML ~~LOC~~ SOLN
40.0000 mg | SUBCUTANEOUS | Status: DC
  Administered 2014-01-10 – 2014-01-12 (×3): 40 mg via SUBCUTANEOUS
  Filled 2014-01-10 (×3): qty 0.4

## 2014-01-10 MED ORDER — NITROGLYCERIN 2 % TD OINT
0.5000 [in_us] | TOPICAL_OINTMENT | Freq: Four times a day (QID) | TRANSDERMAL | Status: DC
  Administered 2014-01-10 – 2014-01-11 (×7): 0.5 [in_us] via TOPICAL
  Filled 2014-01-10: qty 30

## 2014-01-10 MED ORDER — SODIUM CHLORIDE 0.9 % IV SOLN
INTRAVENOUS | Status: DC
  Administered 2014-01-10: 11:00:00 via INTRAVENOUS

## 2014-01-10 MED ORDER — HYDROCHLOROTHIAZIDE 12.5 MG PO CAPS
12.5000 mg | ORAL_CAPSULE | Freq: Every day | ORAL | Status: DC
  Administered 2014-01-10 – 2014-01-12 (×3): 12.5 mg via ORAL
  Filled 2014-01-10 (×3): qty 1

## 2014-01-10 MED ORDER — ONDANSETRON HCL 4 MG PO TABS: 4.0000 mg | ORAL_TABLET | Freq: Four times a day (QID) | ORAL | Status: DC | PRN

## 2014-01-10 MED ORDER — LOSARTAN POTASSIUM-HCTZ 50-12.5 MG PO TABS: 1.0000 | ORAL_TABLET | Freq: Every day | ORAL | Status: DC

## 2014-01-10 MED ORDER — GABAPENTIN 300 MG PO CAPS
900.0000 mg | ORAL_CAPSULE | Freq: Three times a day (TID) | ORAL | Status: DC
  Administered 2014-01-10 – 2014-01-11 (×4): 900 mg via ORAL
  Filled 2014-01-10 (×6): qty 3

## 2014-01-10 MED ORDER — LEVOFLOXACIN IN D5W 750 MG/150ML IV SOLN
750.0000 mg | Freq: Once | INTRAVENOUS | Status: AC
  Administered 2014-01-10: 750 mg via INTRAVENOUS
  Filled 2014-01-10: qty 150

## 2014-01-10 MED ORDER — ROSUVASTATIN CALCIUM 5 MG PO TABS
5.0000 mg | ORAL_TABLET | Freq: Every day | ORAL | Status: DC
  Administered 2014-01-10 – 2014-01-11 (×2): 5 mg via ORAL
  Filled 2014-01-10 (×4): qty 1

## 2014-01-10 MED ORDER — METHYLPREDNISOLONE SODIUM SUCC 125 MG IJ SOLR
60.0000 mg | Freq: Two times a day (BID) | INTRAMUSCULAR | Status: DC
  Administered 2014-01-10 – 2014-01-11 (×3): 60 mg via INTRAVENOUS
  Filled 2014-01-10 (×5): qty 0.96

## 2014-01-10 MED ORDER — IPRATROPIUM-ALBUTEROL 0.5-2.5 (3) MG/3ML IN SOLN
RESPIRATORY_TRACT | Status: AC
  Filled 2014-01-10: qty 3

## 2014-01-10 MED ORDER — IPRATROPIUM-ALBUTEROL 0.5-2.5 (3) MG/3ML IN SOLN
3.0000 mL | Freq: Three times a day (TID) | RESPIRATORY_TRACT | Status: DC
  Administered 2014-01-10 – 2014-01-11 (×5): 3 mL via RESPIRATORY_TRACT
  Filled 2014-01-10 (×5): qty 3

## 2014-01-10 MED ORDER — INSULIN ASPART 100 UNIT/ML ~~LOC~~ SOLN
0.0000 [IU] | Freq: Every day | SUBCUTANEOUS | Status: DC
  Administered 2014-01-10: 3 [IU] via SUBCUTANEOUS

## 2014-01-10 MED ORDER — PANTOPRAZOLE SODIUM 40 MG PO TBEC
40.0000 mg | DELAYED_RELEASE_TABLET | Freq: Every morning | ORAL | Status: DC
  Administered 2014-01-10 – 2014-01-12 (×3): 40 mg via ORAL
  Filled 2014-01-10 (×3): qty 1

## 2014-01-10 MED ORDER — ASPIRIN EC 81 MG PO TBEC
81.0000 mg | DELAYED_RELEASE_TABLET | ORAL | Status: DC
  Administered 2014-01-11: 81 mg via ORAL
  Filled 2014-01-10: qty 1

## 2014-01-10 MED ORDER — INSULIN ASPART 100 UNIT/ML ~~LOC~~ SOLN
0.0000 [IU] | Freq: Three times a day (TID) | SUBCUTANEOUS | Status: DC
  Administered 2014-01-10: 2 [IU] via SUBCUTANEOUS
  Administered 2014-01-11: 7 [IU] via SUBCUTANEOUS
  Administered 2014-01-11: 5 [IU] via SUBCUTANEOUS

## 2014-01-10 MED ORDER — METOPROLOL SUCCINATE ER 50 MG PO TB24
50.0000 mg | ORAL_TABLET | Freq: Two times a day (BID) | ORAL | Status: DC
  Administered 2014-01-10 – 2014-01-12 (×6): 50 mg via ORAL
  Filled 2014-01-10 (×7): qty 1

## 2014-01-10 MED ORDER — SODIUM CHLORIDE 0.9 % IJ SOLN
3.0000 mL | Freq: Two times a day (BID) | INTRAMUSCULAR | Status: DC
  Administered 2014-01-10 – 2014-01-12 (×4): 3 mL via INTRAVENOUS

## 2014-01-10 MED ORDER — ACETAMINOPHEN 650 MG RE SUPP: 650.0000 mg | Freq: Four times a day (QID) | RECTAL | Status: DC | PRN

## 2014-01-10 MED ORDER — CETYLPYRIDINIUM CHLORIDE 0.05 % MT LIQD
7.0000 mL | Freq: Two times a day (BID) | OROMUCOSAL | Status: DC
  Administered 2014-01-10 – 2014-01-12 (×5): 7 mL via OROMUCOSAL

## 2014-01-10 MED ORDER — IPRATROPIUM-ALBUTEROL 0.5-2.5 (3) MG/3ML IN SOLN: 3.0000 mL | RESPIRATORY_TRACT | Status: DC

## 2014-01-10 MED ORDER — ACETAMINOPHEN 325 MG PO TABS: 650.0000 mg | ORAL_TABLET | Freq: Four times a day (QID) | ORAL | Status: DC | PRN

## 2014-01-10 MED ORDER — NITROGLYCERIN 0.4 MG SL SUBL: 0.4000 mg | SUBLINGUAL_TABLET | SUBLINGUAL | Status: DC | PRN

## 2014-01-10 MED ORDER — ONDANSETRON HCL 4 MG/2ML IJ SOLN: 4.0000 mg | Freq: Four times a day (QID) | INTRAMUSCULAR | Status: DC | PRN

## 2014-01-10 MED ORDER — GUAIFENESIN ER 600 MG PO TB12
600.0000 mg | ORAL_TABLET | Freq: Two times a day (BID) | ORAL | Status: DC
  Administered 2014-01-10 – 2014-01-12 (×6): 600 mg via ORAL
  Filled 2014-01-10 (×8): qty 1

## 2014-01-10 MED ORDER — IPRATROPIUM-ALBUTEROL 0.5-2.5 (3) MG/3ML IN SOLN
3.0000 mL | RESPIRATORY_TRACT | Status: DC | PRN
  Administered 2014-01-10: 3 mL via RESPIRATORY_TRACT

## 2014-01-10 MED ORDER — LEVOFLOXACIN IN D5W 750 MG/150ML IV SOLN
750.0000 mg | INTRAVENOUS | Status: DC
  Filled 2014-01-10: qty 150

## 2014-01-10 MED ORDER — HYDROCODONE-ACETAMINOPHEN 5-325 MG PO TABS: 1.0000 | ORAL_TABLET | Freq: Every day | ORAL | Status: DC | PRN

## 2014-01-10 NOTE — Progress Notes (Addendum)
[facility] Progress Note   [name] [name] RRN:[id number] DOB: 1930-02-04 DOA: 01/09/2014 PCP: Cari Caraway, MD  Brief narrative: [name] [name] is a 80 y.o. female with Past medical history of GERD, noncardiac chest pain, PA-C, diabetes mellitus, hypertension, peripheral neuropathy. Presents to the hospital with a complaint of cough and shortness of breath and found to have acute bronchitis with bronchospasm.   Subjective: Cough is slightly better. Breathing has improved.  Assessment/Plan: Principal Problem:   Acute bronchitis with bronchospasm - requiring 3 L of oxygen to keep her pulse ox greater than 95% -Continue steroids, neb treatments and Levaquin -Influenza panel negative -Rapid strep screen negative  Active Problems:   Benign hypertensive heart disease without heart failure -Compensated    Type II diabetes mellitus -Currently on sliding scale insulin    Paroxysmal atrial fibrillation -This has never been confirmed-on 12/22 it was recommended that a 30 day event monitor be placed to rule out paroxysmal A. fib -At that time it was recommended by cardiology to continue with aspirin 81 mg which she is currently not on-I will place her on this    Code Status: Full code Family Communication: Disposition Plan: To be determined DVT prophylaxis: Lovenox Consultants: None  Procedures: None  Antibiotics: Anti-infectives    Start     Dose/Rate Route Frequency Ordered Stop   01/11/14 2200  levofloxacin (LEVAQUIN) IVPB 750 mg    Comments:  Levaquin 750 mg IV q48h for CrCl < 50 mL/min   750 mg100 mL/hr over 90 Minutes Intravenous Every 48 hours 01/10/14 0027     01/10/14 0030  levofloxacin (LEVAQUIN) IVPB 750 mg     750 mg100 mL/hr over 90 Minutes Intravenous  Once 01/10/14 0027 01/10/14 0408         Objective: Filed Weights   01/10/14 0112  Weight: 101.6 kg (223 lb 15.8 oz)    Intake/Output Summary (Last 24 hours) at 01/10/14 1514 Last data filed at  01/10/14 1300  Gross per 24 hour  Intake    240 ml  Output      0 ml  Net    240 ml     Vitals Filed Vitals:   01/10/14 0211 01/10/14 0400 01/10/14 1340 01/10/14 1458  BP:  118/49  120/50  Pulse:  100    Temp:    98 F (36.7 C)  TempSrc:    Oral  Resp:  20  20  Height:      Weight:      SpO2: 98% 100% 99% 98%    Exam: General: Awake alert oriented 3 No acute respiratory distress Lungs: No crackles or wheezes -has a harsh cough with some upper airway rhonchi Cardiovascular: Regular rate and rhythm without murmur gallop or rub normal S1 and S2 Abdomen: Nontender, nondistended, soft, bowel sounds positive, no rebound, no ascites, no appreciable mass Extremities: No significant cyanosis, clubbing, or edema bilateral lower extremities  Data Reviewed: Basic Metabolic Panel:  Recent Labs Lab 01/09/14 1938 01/10/14 0703  NA 140 135  K 3.9 5.1  CL 99 95*  CO2 29 27  GLUCOSE 176* 312*  BUN 22 24*  CREATININE 1.02 1.02  CALCIUM 8.7 8.4   Liver Function Tests:  Recent Labs Lab 01/10/14 0703  AST 21  ALT 11  ALKPHOS 84  BILITOT 0.4  PROT 7.0  ALBUMIN 3.4*   No results for input(s): LIPASE, AMYLASE in the last 168 hours. No results for input(s): AMMONIA in the last 168 hours. CBC:  Recent Labs Lab 01/09/14  1938 01/10/14 0703  WBC 14.4* 16.0*  NEUTROABS 11.3* 15.3*  HGB 10.3* 10.5*  HCT 33.8* 34.0*  MCV 99.1 102.1*  PLT 225 209   Cardiac Enzymes:  Recent Labs Lab 01/09/14 1938  TROPONINI <0.03   BNP (last 3 results) No results for input(s): PROBNP in the last 8760 hours. CBG:  Recent Labs Lab 01/03/14 1656 01/10/14 0128  GLUCAP 145* 187*    Recent Results (from the past 240 hour(s))  Rapid strep screen     Status: None   Collection Time: 01/10/14 12:38 AM  Result Value Ref Range Status   Streptococcus, Group A Screen (Direct) NEGATIVE NEGATIVE Final    Comment: (NOTE) A Rapid Antigen test may result negative if the antigen level in  the sample is below the detection level of this test. The FDA has not cleared this test as a stand-alone test therefore the rapid antigen negative result has reflexed to a Group A Strep culture.      Studies:  Recent x-ray studies have been reviewed in detail by the Attending Physician  Scheduled Meds:  Scheduled Meds: . antiseptic oral rinse  7 mL Mouth Rinse BID  . [START ON 01/11/2014] aspirin EC  81 mg Oral Q M,W,F  . enoxaparin (LOVENOX) injection  40 mg Subcutaneous Q24H  . gabapentin  900 mg Oral TID  . guaiFENesin  600 mg Oral BID  . hydrochlorothiazide  12.5 mg Oral Daily  . insulin aspart  0-5 Units Subcutaneous QHS  . insulin aspart  0-9 Units Subcutaneous TID WC  . ipratropium-albuterol  3 mL Nebulization TID  . [START ON 01/11/2014] levofloxacin (LEVAQUIN) IV  750 mg Intravenous Q48H  . losartan  50 mg Oral Daily  . methylPREDNISolone (SOLU-MEDROL) injection  60 mg Intravenous Q12H  . metoprolol succinate  50 mg Oral BID  . nitroGLYCERIN  0.5 inch Topical 4 times per day  . pantoprazole  40 mg Oral q morning - 10a  . rosuvastatin  5 mg Oral QHS  . sodium chloride  3 mL Intravenous Q12H   Continuous Infusions: . sodium chloride 75 mL/hr at 01/10/14 1100    Time spent on care of this patient: 35 minutes   [place], MD 01/10/2014, 3:14 PM  LOS: 1 day   [facility] Office  [phone] Pager - Text Page per [email or web address]  If 7PM-7AM, please contact night-coverage [email or web address]

## 2014-01-10 NOTE — Care Management Note (Addendum)
    Page 1 of 1   01/12/2014     3:37:52 PM CARE MANAGEMENT NOTE 01/12/2014  Patient:  [name] [name]   Account Number:  [id number]  Date Initiated:  01/10/2014  Documentation initiated by:  Elissa Hefty  Subjective/Objective Assessment:   adm w fever, bronchitis     Action/Plan:   lives w husband, pcp dr Cari Caraway   Anticipated DC Date:  01/12/2014   Anticipated DC Plan:  [place]         Choice offered to / List presented to:             Status of service:  Completed, signed off Medicare Important Message given?  YES (If response is "NO", the following Medicare IM given date fields will be blank) Date Medicare IM given:  01/12/2014 Medicare IM given by:  Marvetta Gibbons Date Additional Medicare IM given:   Additional Medicare IM given by:    Discharge Disposition:  HOME/SELF CARE  Per UR Regulation:  Reviewed for med. necessity/level of care/duration of stay  If discussed at [place] of Stay Meetings, dates discussed:    Comments:

## 2014-01-11 DIAGNOSIS — E1140 Type 2 diabetes mellitus with diabetic neuropathy, unspecified: Secondary | ICD-10-CM

## 2014-01-11 LAB — CBC
HEMATOCRIT: 32.8 % — AB (ref 36.0–46.0)
Hemoglobin: 9.7 g/dL — ABNORMAL LOW (ref 12.0–15.0)
MCH: 29.9 pg (ref 26.0–34.0)
MCHC: 29.6 g/dL — ABNORMAL LOW (ref 30.0–36.0)
MCV: 101.2 fL — ABNORMAL HIGH (ref 78.0–100.0)
Platelets: 224 10*3/uL (ref 150–400)
RBC: 3.24 MIL/uL — AB (ref 3.87–5.11)
RDW: 13.2 % (ref 11.5–15.5)
WBC: 12.1 10*3/uL — AB (ref 4.0–10.5)

## 2014-01-11 LAB — BASIC METABOLIC PANEL
Anion gap: 5 (ref 5–15)
BUN: 34 mg/dL — AB (ref 6–23)
CHLORIDE: 98 meq/L (ref 96–112)
CO2: 34 mmol/L — ABNORMAL HIGH (ref 19–32)
Calcium: 8.5 mg/dL (ref 8.4–10.5)
Creatinine, Ser: 1.18 mg/dL — ABNORMAL HIGH (ref 0.50–1.10)
GFR calc non Af Amer: 42 mL/min — ABNORMAL LOW (ref >90)
GFR, EST AFRICAN AMERICAN: 49 mL/min — AB (ref >90)
GLUCOSE: 291 mg/dL — AB (ref 70–99)
POTASSIUM: 4.9 mmol/L (ref 3.5–5.1)
Sodium: 137 mmol/L (ref 135–145)

## 2014-01-11 LAB — GLUCOSE, CAPILLARY
GLUCOSE-CAPILLARY: 231 mg/dL — AB (ref 70–99)
Glucose-Capillary: 197 mg/dL — ABNORMAL HIGH (ref 70–99)
Glucose-Capillary: 264 mg/dL — ABNORMAL HIGH (ref 70–99)
Glucose-Capillary: 263 mg/dL — ABNORMAL HIGH (ref 70–99)
Glucose-Capillary: 300 mg/dL — ABNORMAL HIGH (ref 70–99)
Glucose-Capillary: 248 mg/dL — ABNORMAL HIGH (ref 70–99)

## 2014-01-11 LAB — CULTURE, GROUP A STREP

## 2014-01-11 LAB — HEMOGLOBIN A1C
HEMOGLOBIN A1C: 7 % — AB (ref <5.7)
MEAN PLASMA GLUCOSE: 154 mg/dL — AB (ref <117)

## 2014-01-11 MED ORDER — INSULIN DETEMIR 100 UNIT/ML ~~LOC~~ SOLN
10.0000 [IU] | Freq: Every day | SUBCUTANEOUS | Status: DC
  Administered 2014-01-11: 10 [IU] via SUBCUTANEOUS
  Filled 2014-01-11 (×2): qty 0.1

## 2014-01-11 MED ORDER — FUROSEMIDE 20 MG PO TABS
20.0000 mg | ORAL_TABLET | Freq: Every day | ORAL | Status: DC
  Administered 2014-01-12: 20 mg via ORAL
  Filled 2014-01-11: qty 1

## 2014-01-11 MED ORDER — PREDNISONE 20 MG PO TABS
40.0000 mg | ORAL_TABLET | Freq: Every day | ORAL | Status: DC
  Administered 2014-01-12: 40 mg via ORAL
  Filled 2014-01-11 (×2): qty 2

## 2014-01-11 MED ORDER — METFORMIN HCL 500 MG PO TABS
1000.0000 mg | ORAL_TABLET | Freq: Two times a day (BID) | ORAL | Status: DC
  Administered 2014-01-11 – 2014-01-12 (×2): 1000 mg via ORAL
  Filled 2014-01-11 (×4): qty 2

## 2014-01-11 MED ORDER — LEVOFLOXACIN 750 MG PO TABS
750.0000 mg | ORAL_TABLET | ORAL | Status: DC
  Administered 2014-01-11: 750 mg via ORAL
  Filled 2014-01-11: qty 1

## 2014-01-11 MED ORDER — INSULIN ASPART 100 UNIT/ML ~~LOC~~ SOLN
0.0000 [IU] | Freq: Every day | SUBCUTANEOUS | Status: DC
  Administered 2014-01-11: 2 [IU] via SUBCUTANEOUS

## 2014-01-11 MED ORDER — LEVALBUTEROL HCL 0.63 MG/3ML IN NEBU
0.6300 mg | INHALATION_SOLUTION | Freq: Four times a day (QID) | RESPIRATORY_TRACT | Status: DC
  Administered 2014-01-11 – 2014-01-12 (×3): 0.63 mg via RESPIRATORY_TRACT
  Filled 2014-01-11 (×7): qty 3

## 2014-01-11 MED ORDER — INSULIN ASPART 100 UNIT/ML ~~LOC~~ SOLN
0.0000 [IU] | Freq: Three times a day (TID) | SUBCUTANEOUS | Status: DC
  Administered 2014-01-11: 7 [IU] via SUBCUTANEOUS
  Administered 2014-01-12 (×2): 4 [IU] via SUBCUTANEOUS

## 2014-01-11 NOTE — Progress Notes (Signed)
Patient ambulated in hallway 1X with cane; patient ambulated 400 ft; patient tolerated walk well; O2 saturations while ambulating on 2L 99%, decreased O2 to 1L-back in room patients O2 was 96 on 1L will try patient on room air and come back to assess O2 saturations.  Rowe Pavy, RN

## 2014-01-11 NOTE — Progress Notes (Signed)
Inpatient Diabetes Program Recommendations  AACE/ADA: New Consensus Statement on Inpatient Glycemic Control (2013)  Target Ranges:  Prepandial:   less than 140 mg/dL      Peak postprandial:   less than 180 mg/dL (1-2 hours)      Critically ill patients:  140 - 180 mg/dL   Reason for Visit: Hyperglycemia  Diabetes history: DM2 Outpatient Diabetes medications: metformin 1000 mg bid Current orders for Inpatient glycemic control: Novolog sensitive tidwc On Solumedrol 60 Q12H.  CBGs elevated.  Inpatient Diabetes Program Recommendations Insulin - Basal: If FBS > 180 mg/dL, consider addition of Lantus 12 units QHS Correction (SSI): Increase Novolog to moderate tidwc and hs HgbA1C: Check HgbA1C to assess glycemic control prior to admission  Note: Will continue to follow. Thank you. Lorenda Peck, RD, LDN, CDE Inpatient Diabetes Coordinator [phone]

## 2014-01-11 NOTE — Consult Note (Signed)
Referral received from inpatient case manager for [employer] member.  Met with the patient to explain [employer] benefits for [employer] members.  Patient verbalized interest and wanted information packet left for her review.  She requested a return visit tomorrow after she reviews the information.  Patient also had a visitor to come at bedside.  For questions, call Natividad Brood, RN, BSN, [facility] Liaison at [phone].

## 2014-01-11 NOTE — Progress Notes (Signed)
[facility] Progress Note   [name] [name] DGL:[id number] DOB: 1930/08/26 DOA: 01/09/2014 PCP: Cari Caraway, MD  Brief narrative: [name] [name] is a 80 y.o. female with Past medical history of GERD, noncardiac chest pain, PA-C, diabetes mellitus, hypertension, peripheral neuropathy. Presents to the hospital with a complaint of cough and shortness of breath and found to have acute bronchitis with bronchospasm.   Assessment/Plan: Principal Problem:   Acute bronchitis with bronchospasm Change to po steroids. Wean oxygen. Ambulate  Somnolence: hold all sedating meds.    Benign hypertensive heart disease without heart failure stable     Type II diabetes mellitus CBGs high. Add levemir and resume home meds    Paroxysmal atrial fibrillation: on ASA. Will discuss with Dr. Mare Ferrari anticoagulation   Code Status: Full code Family Communication: sister and dauther DVT prophylaxis: Lovenox Consultants: None  Procedures: None  Antibiotics: Anti-infectives    Start     Dose/Rate Route Frequency Ordered Stop   01/11/14 2200  levofloxacin (LEVAQUIN) IVPB 750 mg  Status:  Discontinued    Comments:  Levaquin 750 mg IV q48h for CrCl < 50 mL/min   750 mg100 mL/hr over 90 Minutes Intravenous Every 48 hours 01/10/14 0027 01/11/14 0809   01/11/14 2200  levofloxacin (LEVAQUIN) tablet 750 mg     750 mg Oral Every 48 hours 01/11/14 0809     01/10/14 0030  levofloxacin (LEVAQUIN) IVPB 750 mg     750 mg100 mL/hr over 90 Minutes Intravenous  Once 01/10/14 0027 01/10/14 0408      Subjective: Wants to go home. Sleepy because unable to sleep last night. Has not ambulated   Objective: [place] Weights   01/10/14 0112 01/11/14 0726  Weight: 101.6 kg (223 lb 15.8 oz) 102.6 kg (226 lb 3.1 oz)    Intake/Output Summary (Last 24 hours) at 01/11/14 1448 Last data filed at 01/11/14 1300  Gross per 24 hour  Intake    780 ml  Output    450 ml  Net    330 ml     Vitals Filed Vitals:   01/11/14 0615 01/11/14 0726 01/11/14 1308 01/11/14 1353  BP: 125/50   120/48  Pulse: 59  60 98  Temp: 97.4 F (36.3 C)   97.8 F (36.6 C)  TempSrc: Oral   Oral  Resp: 20  18 18   Height:      Weight:  102.6 kg (226 lb 3.1 oz)    SpO2: 100%   96%    Exam: General: very somnolent. Only briefly arousable. Answers a few questions Lungs: diminished thoughout without WRR Cardiovascular: Regular rate and rhythm without murmur gallop or rub normal S1 and S2 Abdomen: Nontender, nondistended, soft, bowel sounds positive, no rebound, obese Extremities: No significant cyanosis, clubbing, trace edema  Data Reviewed: Basic Metabolic Panel:  Recent Labs Lab 01/09/14 1938 01/10/14 0703 01/11/14 0610  NA 140 135 137  K 3.9 5.1 4.9  CL 99 95* 98  CO2 29 27 34*  GLUCOSE 176* 312* 291*  BUN 22 24* 34*  CREATININE 1.02 1.02 1.18*  CALCIUM 8.7 8.4 8.5   Liver Function Tests:  Recent Labs Lab 01/10/14 0703  AST 21  ALT 11  ALKPHOS 84  BILITOT 0.4  PROT 7.0  ALBUMIN 3.4*   No results for input(s): LIPASE, AMYLASE in the last 168 hours. No results for input(s): AMMONIA in the last 168 hours. CBC:  Recent Labs Lab 01/09/14 1938 01/10/14 0703 01/11/14 0610  WBC 14.4* 16.0* 12.1*  NEUTROABS 11.3* 15.3*  --  HGB 10.3* 10.5* 9.7*  HCT 33.8* 34.0* 32.8*  MCV 99.1 102.1* 101.2*  PLT 225 209 224   Cardiac Enzymes:  Recent Labs Lab 01/09/14 1938  TROPONINI <0.03   BNP (last 3 results) No results for input(s): PROBNP in the last 8760 hours. CBG:  Recent Labs Lab 01/10/14 0128 01/10/14 1740 01/10/14 2048 01/11/14 0613 01/11/14 1126  GLUCAP 187* 197* 264* 263* 300*    Recent Results (from the past 240 hour(s))  Rapid strep screen     Status: None   Collection Time: 01/10/14 12:38 AM  Result Value Ref Range Status   Streptococcus, Group A Screen (Direct) NEGATIVE NEGATIVE Final    Comment: (NOTE) A Rapid Antigen test may result negative if the antigen level in  the sample is below the detection level of this test. The FDA has not cleared this test as a stand-alone test therefore the rapid antigen negative result has reflexed to a Group A Strep culture.   Culture, Group A Strep     Status: None (Preliminary result)   Collection Time: 01/10/14 12:38 AM  Result Value Ref Range Status   Specimen Description THROAT  Final   Special Requests NONE  Final   Culture   Final    NO SUSPICIOUS COLONIES, CONTINUING TO HOLD Performed at [facility]    Report Status PENDING  Incomplete     Studies:  Recent x-ray studies have been reviewed in detail by the Attending Physician  Scheduled Meds:  Scheduled Meds: . antiseptic oral rinse  7 mL Mouth Rinse BID  . aspirin EC  81 mg Oral Q M,W,F  . enoxaparin (LOVENOX) injection  40 mg Subcutaneous Q24H  . gabapentin  900 mg Oral TID  . guaiFENesin  600 mg Oral BID  . hydrochlorothiazide  12.5 mg Oral Daily  . insulin aspart  0-5 Units Subcutaneous QHS  . insulin aspart  0-9 Units Subcutaneous TID WC  . ipratropium-albuterol  3 mL Nebulization TID  . levofloxacin  750 mg Oral Q48H  . losartan  50 mg Oral Daily  . methylPREDNISolone (SOLU-MEDROL) injection  60 mg Intravenous Q12H  . metoprolol succinate  50 mg Oral BID  . nitroGLYCERIN  0.5 inch Topical 4 times per day  . pantoprazole  40 mg Oral q morning - 10a  . rosuvastatin  5 mg Oral QHS  . sodium chloride  3 mL Intravenous Q12H   Continuous Infusions: . sodium chloride 75 mL/hr at 01/10/14 1700    Time spent on care of this patient: 35 minutes   [place], MD 01/11/2014, 2:48 PM  LOS: 2 days   [facility] Office  [phone] Pager - Text Page per [email or web address]  If 7PM-7AM, please contact night-coverage [email or web address]

## 2014-01-12 LAB — GLUCOSE, CAPILLARY
GLUCOSE-CAPILLARY: 190 mg/dL — AB (ref 70–99)
Glucose-Capillary: 157 mg/dL — ABNORMAL HIGH (ref 70–99)

## 2014-01-12 MED ORDER — HYDROCOD POLST-CHLORPHEN POLST 10-8 MG/5ML PO LQCR
5.0000 mL | Freq: Two times a day (BID) | ORAL | Status: DC | PRN
  Administered 2014-01-12: 5 mL via ORAL
  Filled 2014-01-12: qty 5

## 2014-01-12 MED ORDER — GABAPENTIN 600 MG PO TABS
600.0000 mg | ORAL_TABLET | Freq: Once | ORAL | Status: AC
  Administered 2014-01-12: 600 mg via ORAL
  Filled 2014-01-12: qty 1

## 2014-01-12 MED ORDER — PREDNISONE 10 MG PO TABS: ORAL_TABLET | ORAL | Status: DC

## 2014-01-12 MED ORDER — LEVOFLOXACIN 500 MG PO TABS: 500.0000 mg | ORAL_TABLET | Freq: Every day | ORAL | Status: DC

## 2014-01-12 MED ORDER — GABAPENTIN 300 MG PO CAPS
300.0000 mg | ORAL_CAPSULE | Freq: Two times a day (BID) | ORAL | Status: DC
  Administered 2014-01-12: 300 mg via ORAL
  Filled 2014-01-12: qty 1

## 2014-01-12 MED ORDER — ACETAMINOPHEN 325 MG PO TABS: 650.0000 mg | ORAL_TABLET | Freq: Four times a day (QID) | ORAL | Status: DC | PRN

## 2014-01-12 MED ORDER — LEVALBUTEROL HCL 1.25 MG/0.5ML IN NEBU
1.2500 mg | INHALATION_SOLUTION | Freq: Once | RESPIRATORY_TRACT | Status: DC
  Filled 2014-01-12: qty 0.5

## 2014-01-12 MED ORDER — FLUTICASONE PROPIONATE 50 MCG/ACT NA SUSP: 2.0000 | Freq: Every day | NASAL | Status: DC

## 2014-01-12 NOTE — Evaluation (Addendum)
Physical Therapy Evaluation and Discharge Patient Details Name: [name] [name] MRN: [id number] DOB: February 16, 1930 Today's Date: 01/12/2014   History of Present Illness  [name] [name] is a 80 y.o. female with Past medical history of GERD, noncardiac chest pain, PA-C, diabetes mellitus, hypertension, peripheral neuropathy. Presents to the hospital with a complaint of cough and shortness of breath and found to have acute bronchitis with bronchospasm  Clinical Impression  Patient evaluated by Physical Therapy with no further acute PT needs identified. All education has been completed and the patient has no further questions. SpO2 maintained 92-96% on room air during therapy session today. Ambulates with supervision up to 300 feet and adequately completed stair training. States she feels confident in her ability to mobilize and does not wish to have further PT services. Pt reports she is functioning at her baseline ability. Feel she is adequate for d/c from a mobility standpoint when medically ready. See below for any follow-up Physial Therapy or equipment needs. PT is signing off. Thank you for this referral.     Follow Up Recommendations No PT follow up    Equipment Recommendations  None recommended by PT    Recommendations for Other Services       Precautions / Restrictions Precautions Precautions: Fall Restrictions Weight Bearing Restrictions: No      Mobility  Bed Mobility               General bed mobility comments: sitting EOB  Transfers Overall transfer level: Needs assistance Equipment used: Straight cane Transfers: Sit to/from Stand Sit to Stand: Supervision         General transfer comment: Supervision for safety. Relies heavily on cane. VC for technique. Did not require physical assist   Ambulation/Gait Ambulation/Gait assistance: Supervision Ambulation Distance (Feet): 300 Feet Assistive device: Straight cane Gait Pattern/deviations: Step-through  pattern;Decreased step length - right;Decreased stance time - left;Antalgic;Trunk flexed Gait velocity: decreased   General Gait Details: VC for upright posture. Moderately antalgic gait pattern. SpO2 92% during gait with 2/4 dyspnea. Supervision for safety but no loss of balance noted during bout.  Stairs Stairs: Yes Stairs assistance: Supervision Stair Management: One rail Right;Step to pattern;Forwards;With cane;Sideways Number of Stairs: 2 General stair comments: Educated on safe stair navigation with VC for sequencing and technique. Relies heavily on rail and cane. Ascended forward, descended sideways with both hands on rail. Pt did not wish to practice a second time. Did not require physical assistance.  Wheelchair Mobility    Modified Rankin (Stroke Patients Only)       Balance Overall balance assessment: Needs assistance;History of Falls Sitting-balance support: No upper extremity supported;Feet supported Sitting balance-Leahy Scale: Good     Standing balance support: No upper extremity supported;During functional activity Standing balance-Leahy Scale: Fair                               Pertinent Vitals/Pain Pain Assessment: No/denies pain  HR 109-130 during therapy session SpO2 92-96% on room air. Pt reports her cardiologist is aware of her elevating HR and has a follow up appt scheduled for next week.    Home Living Family/patient expects to be discharged to:: Private residence Living Arrangements: Spouse/significant other Available Help at Discharge: Family;Available 24 hours/day (husband with alzheimer's) Type of Home: House Home Access: Stairs to enter Entrance Stairs-Rails: Right Entrance Stairs-Number of Steps: 2 Home Layout: Two level;Able to live on main level with bedroom/bathroom Home Equipment: Cane - single  point;Walker - 2 wheels;Shower seat      Prior Function Level of Independence: Independent with assistive device(s)          Comments: uses cane for ambulation.     Hand Dominance   Dominant Hand: Right    Extremity/Trunk Assessment   Upper Extremity Assessment: Defer to OT evaluation           Lower Extremity Assessment: Generalized weakness;LLE deficits/detail   LLE Deficits / Details: Hx of knee replacement, decreased ROM and strength. Antalgic gait functionally     Communication   Communication: No difficulties  Cognition Arousal/Alertness: Awake/alert Behavior During Therapy: WFL for tasks assessed/performed Overall Cognitive Status: Within Functional Limits for tasks assessed                      General Comments General comments (skin integrity, edema, etc.): SpO2 ranged from 92-96% throughout therapy session on room air. (96% at rest)     Exercises General Exercises - Lower Extremity Ankle Circles/Pumps: AROM;Both;10 reps;Seated Quad Sets: Strengthening;Both;10 reps;Seated Long Arc Quad: Strengthening;Both;10 reps;Seated      Assessment/Plan    PT Assessment Patent does not need any further PT services  PT Diagnosis Difficulty walking;Abnormality of gait;Generalized weakness   PT Problem List    PT Treatment Interventions     PT Goals (Current goals can be found in the Care Plan section) Acute Rehab PT Goals Patient Stated Goal: Go home PT Goal Formulation: All assessment and education complete, DC therapy    Frequency     Barriers to discharge        Co-evaluation               End of Session Equipment Utilized During Treatment: Gait belt Activity Tolerance: Patient tolerated treatment well Patient left: in chair;with call bell/phone within reach Nurse Communication: Mobility status         Time: 8325-4982 PT Time Calculation (min) (ACUTE ONLY): 25 min   Charges:   PT Evaluation $Initial PT Evaluation Tier I: 1 Procedure PT Treatments $Gait Training: 8-22 mins   PT G CodesEllouise Newer 01/12/2014, 10:24 AM [name] [name], [place]   vitals

## 2014-01-12 NOTE — Discharge Summary (Signed)
Physician Discharge Summary  [name] [name] JZP:[id number] DOB: February 21, 1930 DOA: 01/09/2014  PCP: Cari Caraway, MD  Admit date: 01/09/2014 Discharge date: 01/12/2014  Time spent: greater than 30 minutes  Discharge Diagnoses:  Principal Problem:   Acute bronchitis with bronchospasm Active Problems:   Benign hypertensive heart disease without heart failure   PAC (premature atrial contraction)   Type II diabetes mellitus   Paroxysmal atrial fibrillation   Discharge Condition: stable  Filed Weights   01/10/14 0112 01/11/14 0726 01/12/14 0605  Weight: 101.6 kg (223 lb 15.8 oz) 102.6 kg (226 lb 3.1 oz) 103.783 kg (228 lb 12.8 oz)    History of present illness:  80 y.o. female with Past medical history of GERD, noncardiac chest pain, diabetes mellitus, hypertension, peripheral neuropathy. The patient presented with numbness of cough and shortness of breath. She mentions that she has shortness of breath along with cough ongoing for last 1 week. Along with that she also had some chest pain which was feeling like chest tightness across her chest. For this complaint she underwent coronary angiography on 01/03/2014 which was not showing any significant stenosis. She continues to have complaints of chest pain as well as shortness of breath. Later on started having congestion with fever since last 2 days. For further workup she was seen by her PCP PA today and was found to have hypoxia with oxygen and 86 and therefore was recommended to go to the ER for further workup. Patient mentions that she gets bronchitis frequently and a lot of time requires oxygen because of the same. She denies any sick contacts, recent travel. The procedure was done with conscious sedation without any tenderness today she had. She denies any rash anywhere. She denies any changes in her medications. She denies any prior history of asthma denies any significant history of smoking. She denies any prior diagnosis of  COPD.  The patient is coming from home. And at her baseline independent for most of her ADL.   Hospital Course:  Started on antibiotics, steroids, oxygen, bronchodilators.  Had some somnolence, which resolved when gabapentin held.  By discharge, able to ambulate without significant difficulty or hypoxia feeling better and requesting discharge  Procedures:  none  Consultations:  none  Discharge Exam: Filed Vitals:   01/12/14 0605  BP: 113/71  Pulse: 74  Temp: 97.5 F (36.4 C)  Resp: 18    General: a and o comfortable Cardiovascular: RRR Respiratory: CTA without WRR Ext no CCE  Discharge Instructions   Discharge Instructions    Diet - low sodium heart healthy    Complete by:  As directed      Diet Carb Modified    Complete by:  As directed      Increase activity slowly    Complete by:  As directed           Current Discharge Medication List    START taking these medications   Details  acetaminophen (TYLENOL) 325 MG tablet Take 2 tablets (650 mg total) by mouth every 6 (six) hours as needed for mild pain (or Fever >/= 101).    fluticasone (FLONASE) 50 MCG/ACT nasal spray Place 2 sprays into both nostrils daily. Qty: 16 g, Refills: 0    levofloxacin (LEVAQUIN) 500 MG tablet Take 1 tablet (500 mg total) by mouth daily. Until gone Qty: 4 tablet, Refills: 0    predniSONE (DELTASONE) 10 MG tablet 3 tablets daily for 2 days then decrease by 1 tablet every 2 days until off Qty: 12  tablet, Refills: 0      CONTINUE these medications which have NOT CHANGED   Details  albuterol (PROVENTIL HFA;VENTOLIN HFA) 108 (90 BASE) MCG/ACT inhaler Inhale 1 puff into the lungs every 6 (six) hours as needed for wheezing or shortness of breath.    aspirin 81 MG tablet Take 81 mg by mouth every Monday, Wednesday, and Friday. Take 1 on Monday, Wednesday, and Friday only    Calcium Carbonate-Vitamin D (CALCIUM + D PO) Take 1 tablet by mouth 2 (two) times daily.     cholecalciferol  (VITAMIN D) 1000 UNITS tablet Take 1,000 Units by mouth daily.    furosemide (LASIX) 20 MG tablet Take 20 mg by mouth as needed for fluid (Left foot swelling).    gabapentin (NEURONTIN) 300 MG capsule Take 900 mg by mouth 3 (three) times daily.     HYDROcodone-acetaminophen (NORCO/VICODIN) 5-325 MG per tablet Take 1-2 tablets by mouth daily as needed for moderate pain. Qty: 40 tablet, Refills: 0    losartan-hydrochlorothiazide (HYZAAR) 50-12.5 MG per tablet Take 1 tablet by mouth daily.     metFORMIN (GLUCOPHAGE) 1000 MG tablet Take 1 tablet (1,000 mg total) by mouth 2 (two) times daily with a meal.    metoprolol succinate (TOPROL-XL) 50 MG 24 hr tablet Take 1 tablet (50 mg total) by mouth 2 (two) times daily. Qty: 180 tablet, Refills: 3   Associated Diagnoses: PAC (premature atrial contraction)    nitroGLYCERIN (NITROSTAT) 0.4 MG SL tablet Place 1 tablet (0.4 mg total) under the tongue every 5 (five) minutes as needed for chest pain. Qty: 25 tablet, Refills: 6    pantoprazole (PROTONIX) 40 MG tablet Take 40 mg by mouth every morning.     rosuvastatin (CRESTOR) 5 MG tablet Take 5 mg by mouth at bedtime.    triamcinolone (KENALOG) 0.025 % cream Apply 1 application topically as needed (as needed for leg pain).        Allergies  Allergen Reactions  . Codeine Nausea And Vomiting  . Demerol Other (See Comments)    Drunk feeling"  . Statins Other (See Comments)    "hurt"   . Zetia [Ezetimibe] Other (See Comments)    hurt   Follow-up Information    Follow up with [facility], [place].   Specialty:  Family Medicine   Contact information:   [place] [address] [phone]        The results of significant diagnostics from this hospitalization (including imaging, microbiology, ancillary and laboratory) are listed below for reference.    Significant Diagnostic Studies: Dg Chest 2 View (if Patient Has Fever And/or Copd)  01/09/2014   CLINICAL DATA:  Cough  and congestion.  Fever.  EXAM: CHEST  2 VIEW  COMPARISON:  05/05/2013  FINDINGS: Multiple leads overlie the patient. Stable cardiac and mediastinal contours. No large consolidative pulmonary opacities. No pleural effusion or pneumothorax. Mid thoracic spine degenerative change.  IMPRESSION: No acute cardiopulmonary process.   Electronically Signed   By: Lovey Newcomer M.D.   On: 01/09/2014 19:18    Microbiology: Recent Results (from the past 240 hour(s))  Rapid strep screen     Status: None   Collection Time: 01/10/14 12:38 AM  Result Value Ref Range Status   Streptococcus, Group A Screen (Direct) NEGATIVE NEGATIVE Final    Comment: (NOTE) A Rapid Antigen test may result negative if the antigen level in the sample is below the detection level of this test. The FDA has not cleared this  test as a stand-alone test therefore the rapid antigen negative result has reflexed to a Group A Strep culture.   Culture, Group A Strep     Status: None   Collection Time: 01/10/14 12:38 AM  Result Value Ref Range Status   Specimen Description THROAT  Final   Special Requests NONE  Final   Culture   Final    No Beta Hemolytic Streptococci Isolated Performed at [address] [facility]    Report Status 01/11/2014 FINAL  Final     Labs: Basic Metabolic Panel:  Recent Labs Lab 01/09/14 1938 01/10/14 0703 01/11/14 0610  NA 140 135 137  K 3.9 5.1 4.9  CL 99 95* 98  CO2 29 27 34*  GLUCOSE 176* 312* 291*  BUN 22 24* 34*  CREATININE 1.02 1.02 1.18*  CALCIUM 8.7 8.4 8.5   Liver Function Tests:  Recent Labs Lab 01/10/14 0703  AST 21  ALT 11  ALKPHOS 84  BILITOT 0.4  PROT 7.0  ALBUMIN 3.4*   No results for input(s): LIPASE, AMYLASE in the last 168 hours. No results for input(s): AMMONIA in the last 168 hours. CBC:  Recent Labs Lab 01/09/14 1938 01/10/14 0703 01/11/14 0610  WBC 14.4* 16.0* 12.1*  NEUTROABS 11.3* 15.3*  --   HGB 10.3* 10.5* 9.7*  HCT 33.8* 34.0* 32.8*  MCV 99.1 102.1*  101.2*  PLT 225 209 224   Cardiac Enzymes:  Recent Labs Lab 01/09/14 1938  TROPONINI <0.03   BNP: BNP (last 3 results) No results for input(s): PROBNP in the last 8760 hours. CBG:  Recent Labs Lab 01/11/14 0613 01/11/14 1126 01/11/14 1631 01/11/14 2109 01/12/14 0617  GLUCAP 263* 300* 248* 231* 157*       Signed:  Josey Forcier L  [facility] 01/12/2014, 11:13 AM

## 2014-01-12 NOTE — Clinical Social Work Note (Signed)
Met with patient to discuss in home care agencies for herself and husband who has Alzheimer's.  Patient was given information about home care agencies, and assisted living facilities, in case she decides her husband needs more help then she can provide.  CSW will sign off.  Jones Broom. Kinney, MSW, [place] 01/12/2014 1:00 PM

## 2014-01-12 NOTE — Progress Notes (Signed)
DC IV and tele per MD orders and protocol; DC instructions reviewed with patient and daughter at bedside; no questions from patient or daughter.  Rowe Pavy, RN

## 2014-01-12 NOTE — Consult Note (Signed)
Met with the patient and explained [name] [name] as a benefit for her [employer] insurance.  At that point, the patient notified writer that she will be no longer with [employer] starting 01/13/14.  She states she will be a member of [employer].  Patient understands that she will not be eligible for [name] services.  Notified inpatient case manager about patient's new insurance change.  For questions, please call Natividad Brood, RN, BSN, CCM at [phone].

## 2014-01-19 ENCOUNTER — Ambulatory Visit (INDEPENDENT_AMBULATORY_CARE_PROVIDER_SITE_OTHER): Payer: Medicare HMO | Admitting: Cardiology

## 2014-01-19 ENCOUNTER — Encounter (INDEPENDENT_AMBULATORY_CARE_PROVIDER_SITE_OTHER): Payer: Self-pay

## 2014-01-19 ENCOUNTER — Encounter: Payer: Self-pay | Admitting: Cardiology

## 2014-01-19 VITALS — BP 150/62 | HR 99 | Ht 63.0 in | Wt 221.8 lb

## 2014-01-19 DIAGNOSIS — I359 Nonrheumatic aortic valve disorder, unspecified: Secondary | ICD-10-CM

## 2014-01-19 DIAGNOSIS — I491 Atrial premature depolarization: Secondary | ICD-10-CM

## 2014-01-19 DIAGNOSIS — I350 Nonrheumatic aortic (valve) stenosis: Secondary | ICD-10-CM

## 2014-01-19 DIAGNOSIS — I119 Hypertensive heart disease without heart failure: Secondary | ICD-10-CM

## 2014-01-19 NOTE — Progress Notes (Signed)
[name] [name] Date of Birth:  [birth year] [address] [place], [place]  [address] [phone]  Fax   [phone]  HPI: This pleasant 81 year old woman is seen  for a post hospital  office visit.  She was recently hospitalized with suspected crescendo angina pectoris.  She underwent cardiac catheterization on 01/03/14 by Dr. Quay Burow.  The cardiac catheterization showed normal coronary arteries.  Her ejection fraction was 60% and there were no segmental wall motion abnormalities.     Her last echocardiogram was 10/18/10 and showed an ejection fraction of 60-65% with mild aortic stenosis with peak gradient of 22 and mean gradient of 11. The patient has a past history of premature atrial beats.  She previously was seen in the recovery room following surgery at [facility] where her not to was initially felt to show new atrial fibrillation but on careful review she was having sinus rhythm with frequent premature atrial contractions.  These are asymptomatic.  The patient has not been experiencing any chest pain or shortness of breath.  Her EKG today again shows frequent PACs and runs of PACs but no atrial fibrillation. One week after her cardiac catheterization she was rehospitalized for shortness of breath and acute bronchitis from December 28 until December 31.   Current Outpatient Prescriptions  Medication Sig Dispense Refill  . albuterol (PROVENTIL HFA;VENTOLIN HFA) 108 (90 BASE) MCG/ACT inhaler Inhale 1 puff into the lungs every 6 (six) hours as needed for wheezing or shortness of breath.    Marland Kitchen aspirin 81 MG tablet Take 81 mg by mouth every Monday, Wednesday, and Friday. Take 1 on Monday, Wednesday, and Friday only    . cholecalciferol (VITAMIN D) 1000 UNITS tablet Take 1,000 Units by mouth daily.    . fluticasone (FLONASE) 50 MCG/ACT nasal spray Place 2 sprays into both nostrils daily. 16 g 0  . furosemide (LASIX) 20 MG tablet Take 20 mg by mouth as needed for  fluid (Left foot swelling).    . gabapentin (NEURONTIN) 300 MG capsule Take 900 mg by mouth 3 (three) times daily.     Marland Kitchen HYDROcodone-acetaminophen (NORCO/VICODIN) 5-325 MG per tablet Take 1-2 tablets by mouth daily as needed for moderate pain. 40 tablet 0  . losartan-hydrochlorothiazide (HYZAAR) 50-12.5 MG per tablet Take 1 tablet by mouth daily.     . metFORMIN (GLUCOPHAGE) 1000 MG tablet Take 1 tablet (1,000 mg total) by mouth 2 (two) times daily with a meal.    . metoprolol succinate (TOPROL-XL) 50 MG 24 hr tablet Take 1 tablet (50 mg total) by mouth 2 (two) times daily. 180 tablet 3  . nitroGLYCERIN (NITROSTAT) 0.4 MG SL tablet Place 1 tablet (0.4 mg total) under the tongue every 5 (five) minutes as needed for chest pain. 25 tablet 6  . pantoprazole (PROTONIX) 40 MG tablet Take 40 mg by mouth every morning.     . predniSONE (DELTASONE) 10 MG tablet 3 tablets daily for 2 days then decrease by 1 tablet every 2 days until off 12 tablet 0  . rosuvastatin (CRESTOR) 5 MG tablet Take 5 mg by mouth at bedtime.    . triamcinolone (KENALOG) 0.025 % cream Apply 1 application topically as needed (as needed for leg pain).      No current facility-administered medications for this visit.    Allergies  Allergen Reactions  . Codeine Nausea And Vomiting  . Demerol Other (See Comments)    Drunk feeling"  . Statins Other (See Comments)    "  hurt"   . Zetia [Ezetimibe] Other (See Comments)    hurt    Patient Active Problem List   Diagnosis Date Noted  . Acute bronchitis with bronchospasm 01/09/2014  . Non-cardiac chest pain - normal LHC w/ nl EF 12/2013 01/03/2014  . Mild aortic stenosis   . Type II diabetes mellitus   . Cough 01/25/2013  . Lumbosacral spondylosis without myelopathy 05/07/2012  . Disorders of sacrum 05/07/2012  . Degeneration of lumbar or lumbosacral intervertebral disc 05/07/2012  . Diabetic peripheral neuropathy associated with type 2 diabetes mellitus 05/07/2012  . PAC  (premature atrial contraction) 01/21/2012  . Chronic or unspecified duodenal ulcer with hemorrhage, without mention of obstruction 11/12/2010  . Pure hypercholesterolemia 11/12/2010  . Benign hypertensive heart disease without heart failure 11/12/2010  . Aortic stenosis, mild 11/12/2010    History  Smoking status  . Former Smoker -- 0.50 packs/day for 5 years  . Types: Cigarettes  . Quit date: 01/14/1980  Smokeless tobacco  . Never Used    History  Alcohol Use No    Family History  Problem Relation Age of Onset  . Other Mother     died @ 23, complications following childbirth  . COPD Father     died @ 70  . Heart failure Father   . Irregular heart beat Sister     1 sister with PPM.    Review of Systems: The patient denies any heat or cold intolerance.  No weight gain or weight loss.  The patient denies headaches or blurry vision.  There is no cough or sputum production.  The patient denies dizziness.  There is no hematuria or hematochezia.  The patient denies any muscle aches or arthritis.  The patient denies any rash.  The patient denies frequent falling or instability.  There is no history of depression or anxiety.  All other systems were reviewed and are negative.   Physical Exam: Filed Vitals:   01/19/14 1506  BP: 150/62  Pulse: 99   the general appearance reveals a well-developed well-nourished woman in no distress.The head and neck exam reveals pupils equal and reactive.  Extraocular movements are full.  There is no scleral icterus.  The mouth and pharynx are normal.  The neck is supple.  The carotids reveal no bruits.  The jugular venous pressure is normal.  The  thyroid is not enlarged.  There is no lymphadenopathy.  The chest is clear to percussion and auscultation.  There are no rales or rhonchi.  Expansion of the chest is symmetrical.  The precordium is quiet.  The pulse is irregular because of frequent PACs  The first heart sound is normal.  The second heart sound  is physiologically split.  There is no  gallop rub or click.  There is a soft systolic ejection murmur at the base.  There is no abnormal lift or heave.  The abdomen is soft and nontender.  The bowel sounds are normal.  The liver and spleen are not enlarged.  There are no abdominal masses.  There are no abdominal bruits.  Extremities reveal good pedal pulses.  There is no phlebitis or edema.  There is no cyanosis or clubbing.  Strength is normal and symmetrical in all extremities.  There is no lateralizing weakness.  There are no sensory deficits.  The skin is warm and dry.  There is no rash.   EKG confirms sinus tachycardia and frequent PACs and  no acute ischemic changes.   Assessment: 1.Atypical chest  pain with normal cath 01/03/14 2.  Mild aortic stenosis 3.  Diabetes mellitus 4.  Benign hypertensive heart disease without heart failure 5.  Osteoarthritis.  Walks with a cane. 6.  Frequent premature atrial beats. She does not have any documented history of atrial fibrillation. She has been wearing an event monitor for two weeks but refuses to wear it any longer 7.  High stress level secondary to her husband's dementia problems.  Disposition:  Continue Toprol  50 mg twice a day.  Recheck in 3 months for office visit and EKG

## 2014-01-19 NOTE — Patient Instructions (Signed)
Your physician recommends that you continue on your current medications as directed. Please refer to the Current Medication list given to you today.  Your physician wants you to follow-up in: 3 month ov/ekg You will receive a reminder letter in the mail two months in advance. If you don't receive a letter, please call our office to schedule the follow-up appointment.   Ok to mail your monitor back in

## 2014-01-23 ENCOUNTER — Encounter: Payer: Self-pay | Admitting: Physical Medicine & Rehabilitation

## 2014-01-23 ENCOUNTER — Encounter: Payer: Commercial Managed Care - HMO | Attending: Physical Medicine & Rehabilitation

## 2014-01-23 ENCOUNTER — Ambulatory Visit (HOSPITAL_BASED_OUTPATIENT_CLINIC_OR_DEPARTMENT_OTHER): Payer: Medicare HMO | Admitting: Physical Medicine & Rehabilitation

## 2014-01-23 VITALS — BP 121/56 | HR 88 | Resp 14

## 2014-01-23 DIAGNOSIS — M5137 Other intervertebral disc degeneration, lumbosacral region: Secondary | ICD-10-CM

## 2014-01-23 DIAGNOSIS — M47817 Spondylosis without myelopathy or radiculopathy, lumbosacral region: Secondary | ICD-10-CM | POA: Insufficient documentation

## 2014-01-23 DIAGNOSIS — E1142 Type 2 diabetes mellitus with diabetic polyneuropathy: Secondary | ICD-10-CM

## 2014-01-23 DIAGNOSIS — G629 Polyneuropathy, unspecified: Secondary | ICD-10-CM

## 2014-01-23 NOTE — Progress Notes (Signed)
Subjective:    Patient ID: [name] [name], female    DOB: 1930-10-18, 81 y.o.   MRN: [id number]  HPI Interval hx of cardiac cath, this was reportedly normal Hospitalized 4 days with bronchitis, mainly wheezing, finished prednisone Had elevated blood sugars Stressed out with husband  Dec 23, 2013 8:29 AM EST  12/22/2013   Right lumbar L3, L4 medial branch blocks and L5 dorsal ramus injection under fluoroscopic guidance   No pain on right side since injection, hasn't taken hydrocodone  Left sided low back pain ok since L L3-4-5 RF Nov 2014  Pain Inventory Average Pain 4 Pain Right Now 4 My pain is constant and dull  In the last 24 hours, has pain interfered with the following? General activity 5 Relation with others 5 Enjoyment of life 5 What TIME of day is your pain at its worst? evening Sleep (in general) Good  Pain is worse with: walking and standing Pain improves with: medication Relief from Meds: 5  Mobility use a cane how many minutes can you walk? 5-10 ability to climb steps?  no do you drive?  yes  Function retired  Neuro/Psych trouble walking  Prior Studies Any changes since last visit?  no  Physicians involved in your care Any changes since last visit?  no   Family History  Problem Relation Age of Onset  . Other Mother     died @ 71, complications following childbirth  . COPD Father     died @ 29  . Heart failure Father   . Irregular heart beat Sister     1 sister with PPM.   History   Social History  . Marital Status: Married    Spouse Name: N/A    Number of Children: N/A  . Years of Education: N/A   Social History Main Topics  . Smoking status: Former Smoker -- 0.50 packs/day for 5 years    Types: Cigarettes    Quit date: 01/14/1980  . Smokeless tobacco: Never Used  . Alcohol Use: No  . Drug Use: No  . Sexual Activity: None   Other Topics Concern  . None   Social History Narrative   Lives with Husband in [place].  Has a  dtr in [place], [place] and a son in [place], [place].   Past Surgical History  Procedure Laterality Date  . Vesicovaginal fistula closure w/ tah  1964  . US echocardiography  04-26-09    EF 55-60%  . Cardiovascular stress test  05-13-2005    EF 67%  . Knee surgery    . Joint replacement  2000    left knee  . Abdominal hysterectomy  1964  . Toe shortened      right 2nd toe  . Cataract extraction      both eyes  . Mass excision  01/13/2012    Procedure: EXCISION MASS;  Surgeon: Ralene Ok, MD;  Location: [facility];  Service: General;  Laterality: Right;  Excision of Right Back Mass  . Left heart catheterization with coronary angiogram N/A 01/03/2014    Procedure: LEFT HEART CATHETERIZATION WITH CORONARY ANGIOGRAM;  Surgeon: Lorretta Harp, MD;  Location: [facility] CATH LAB;  Service: Cardiovascular;  Laterality: N/A;  . Cardiac catheterization    . Eye surgery      cataract   Past Medical History  Diagnosis Date  . Mild aortic stenosis     a. 10/2010 Echo: Ef 60-65%, no rwma, mild LVH no AS.  Marland Kitchen Chronic back pain   .  Peripheral neuropathy   . GERD (gastroesophageal reflux disease)   . Hypertension   . History of pneumonia   . History of bronchitis   . Basal cell carcinoma     a. 2013.  . Non-cardiac chest pain     LHC w/ nl coronaries and nl EF 60% -12/2013  . PAC (premature atrial contraction)   . Heart murmur   . Type II diabetes mellitus     TYPE 2   BP 121/56 mmHg  Pulse 88  Resp 14  SpO2 94%  Opioid Risk Score:   Fall Risk Score: Moderate Fall Risk (6-13 points)  Review of Systems  Constitutional: Negative.   HENT: Negative.   Eyes: Negative.   Respiratory: Negative.   Cardiovascular: Negative.   Gastrointestinal: Negative.   Endocrine: Negative.   Genitourinary: Negative.   Musculoskeletal: Positive for myalgias, back pain and arthralgias.  Skin: Negative.   Allergic/Immunologic: Negative.   Neurological:       Trouble walking  Hematological: Negative.     Psychiatric/Behavioral: Negative.        Objective:   Physical Exam  Constitutional: She is oriented to person, place, and time. She appears well-developed.  obese  HENT:  Head: Normocephalic and atraumatic.  Eyes: Conjunctivae are normal. Pupils are equal, round, and reactive to light.  Musculoskeletal:       Lumbar back: She exhibits decreased range of motion and tenderness. She exhibits no deformity.  Decreased left range of motion the lumbar spine pain with extension greater than with flexion  Neurological: She is alert and oriented to person, place, and time.  Normal strength in the lower extremities Negative straight leg raising  Psychiatric: She has a normal mood and affect.  Nursing note and vitals reviewed.         Assessment & Plan:  1. Lumbar spondylosis without evidence of radiculopathy or myelopathy. She's had good results with radiofrequency neurotomy,  Left side performed in November 2014 Right-sided low back pain improved after medial branch blocks In November and December 2015 If pain is recurrentIn the next month would proceed to Radiofrequency neurotomy. If pain relief persists for about 3 months post injection would just repeat the right-sided medial branch blocks No longer taking narcotic analgesic medications

## 2014-01-23 NOTE — Patient Instructions (Signed)
Call if low back pain worsens prior to next appt

## 2014-01-25 ENCOUNTER — Other Ambulatory Visit: Payer: Self-pay | Admitting: Dermatology

## 2014-02-01 ENCOUNTER — Emergency Department (HOSPITAL_COMMUNITY): Payer: Medicare HMO

## 2014-02-01 ENCOUNTER — Encounter (HOSPITAL_COMMUNITY): Payer: Self-pay | Admitting: *Deleted

## 2014-02-01 ENCOUNTER — Inpatient Hospital Stay (HOSPITAL_COMMUNITY)
Admission: EM | Admit: 2014-02-01 | Discharge: 2014-02-07 | DRG: 193 | Disposition: A | Discharge status: Skilled Nursing Facility | Payer: Medicare HMO | Attending: Internal Medicine | Admitting: Internal Medicine

## 2014-02-01 DIAGNOSIS — K219 Gastro-esophageal reflux disease without esophagitis: Secondary | ICD-10-CM | POA: Diagnosis present

## 2014-02-01 DIAGNOSIS — Z90710 Acquired absence of both cervix and uterus: Secondary | ICD-10-CM | POA: Diagnosis not present

## 2014-02-01 DIAGNOSIS — Z886 Allergy status to analgesic agent status: Secondary | ICD-10-CM | POA: Diagnosis not present

## 2014-02-01 DIAGNOSIS — J9601 Acute respiratory failure with hypoxia: Secondary | ICD-10-CM | POA: Diagnosis present

## 2014-02-01 DIAGNOSIS — Z888 Allergy status to other drugs, medicaments and biological substances status: Secondary | ICD-10-CM | POA: Diagnosis not present

## 2014-02-01 DIAGNOSIS — Z9841 Cataract extraction status, right eye: Secondary | ICD-10-CM | POA: Diagnosis not present

## 2014-02-01 DIAGNOSIS — G629 Polyneuropathy, unspecified: Secondary | ICD-10-CM | POA: Diagnosis present

## 2014-02-01 DIAGNOSIS — I350 Nonrheumatic aortic (valve) stenosis: Secondary | ICD-10-CM

## 2014-02-01 DIAGNOSIS — Z7951 Long term (current) use of inhaled steroids: Secondary | ICD-10-CM

## 2014-02-01 DIAGNOSIS — Y95 Nosocomial condition: Secondary | ICD-10-CM | POA: Diagnosis present

## 2014-02-01 DIAGNOSIS — J189 Pneumonia, unspecified organism: Secondary | ICD-10-CM | POA: Diagnosis present

## 2014-02-01 DIAGNOSIS — E785 Hyperlipidemia, unspecified: Secondary | ICD-10-CM | POA: Diagnosis present

## 2014-02-01 DIAGNOSIS — R5381 Other malaise: Secondary | ICD-10-CM

## 2014-02-01 DIAGNOSIS — I10 Essential (primary) hypertension: Secondary | ICD-10-CM | POA: Diagnosis present

## 2014-02-01 DIAGNOSIS — N289 Disorder of kidney and ureter, unspecified: Secondary | ICD-10-CM

## 2014-02-01 DIAGNOSIS — Z7982 Long term (current) use of aspirin: Secondary | ICD-10-CM

## 2014-02-01 DIAGNOSIS — Z96652 Presence of left artificial knee joint: Secondary | ICD-10-CM | POA: Diagnosis present

## 2014-02-01 DIAGNOSIS — Z9842 Cataract extraction status, left eye: Secondary | ICD-10-CM | POA: Diagnosis not present

## 2014-02-01 DIAGNOSIS — R0789 Other chest pain: Secondary | ICD-10-CM | POA: Diagnosis present

## 2014-02-01 DIAGNOSIS — R0602 Shortness of breath: Secondary | ICD-10-CM | POA: Diagnosis present

## 2014-02-01 DIAGNOSIS — Z85828 Personal history of other malignant neoplasm of skin: Secondary | ICD-10-CM

## 2014-02-01 DIAGNOSIS — Z87891 Personal history of nicotine dependence: Secondary | ICD-10-CM

## 2014-02-01 DIAGNOSIS — J9600 Acute respiratory failure, unspecified whether with hypoxia or hypercapnia: Secondary | ICD-10-CM | POA: Diagnosis present

## 2014-02-01 DIAGNOSIS — E119 Type 2 diabetes mellitus without complications: Secondary | ICD-10-CM | POA: Diagnosis present

## 2014-02-01 DIAGNOSIS — D72829 Elevated white blood cell count, unspecified: Secondary | ICD-10-CM | POA: Diagnosis present

## 2014-02-01 DIAGNOSIS — E1140 Type 2 diabetes mellitus with diabetic neuropathy, unspecified: Secondary | ICD-10-CM

## 2014-02-01 LAB — CBC WITH DIFFERENTIAL/PLATELET
Basophils Absolute: 0 10*3/uL (ref 0.0–0.1)
Basophils Relative: 0 % (ref 0–1)
EOS PCT: 4 % (ref 0–5)
Eosinophils Absolute: 0.6 10*3/uL (ref 0.0–0.7)
HCT: 38.1 % (ref 36.0–46.0)
Hemoglobin: 11.9 g/dL — ABNORMAL LOW (ref 12.0–15.0)
LYMPHS ABS: 2.2 10*3/uL (ref 0.7–4.0)
Lymphocytes Relative: 14 % (ref 12–46)
MCH: 31.1 pg (ref 26.0–34.0)
MCHC: 31.2 g/dL (ref 30.0–36.0)
MCV: 99.5 fL (ref 78.0–100.0)
MONO ABS: 0.8 10*3/uL (ref 0.1–1.0)
Monocytes Relative: 5 % (ref 3–12)
Neutro Abs: 12.4 10*3/uL — ABNORMAL HIGH (ref 1.7–7.7)
Neutrophils Relative %: 77 % (ref 43–77)
Platelets: 232 10*3/uL (ref 150–400)
RBC: 3.83 MIL/uL — ABNORMAL LOW (ref 3.87–5.11)
RDW: 13.3 % (ref 11.5–15.5)
WBC: 15.9 10*3/uL — ABNORMAL HIGH (ref 4.0–10.5)

## 2014-02-01 LAB — BASIC METABOLIC PANEL
Anion gap: 11 (ref 5–15)
BUN: 24 mg/dL — ABNORMAL HIGH (ref 6–23)
CO2: 30 mmol/L (ref 19–32)
Calcium: 8.8 mg/dL (ref 8.4–10.5)
Chloride: 99 mEq/L (ref 96–112)
Creatinine, Ser: 1.19 mg/dL — ABNORMAL HIGH (ref 0.50–1.10)
GFR calc non Af Amer: 42 mL/min — ABNORMAL LOW (ref >90)
GFR, EST AFRICAN AMERICAN: 48 mL/min — AB (ref >90)
Glucose, Bld: 207 mg/dL — ABNORMAL HIGH (ref 70–99)
POTASSIUM: 4.1 mmol/L (ref 3.5–5.1)
Sodium: 140 mmol/L (ref 135–145)

## 2014-02-01 LAB — CBG MONITORING, ED: Glucose-Capillary: 185 mg/dL — ABNORMAL HIGH (ref 70–99)

## 2014-02-01 LAB — BRAIN NATRIURETIC PEPTIDE: B NATRIURETIC PEPTIDE 5: 85.8 pg/mL (ref 0.0–100.0)

## 2014-02-01 LAB — D-DIMER, QUANTITATIVE: D-Dimer, Quant: 4.07 ug/mL-FEU — ABNORMAL HIGH (ref 0.00–0.48)

## 2014-02-01 LAB — TROPONIN I: Troponin I: <0.03 ng/mL (ref <0.031)

## 2014-02-01 LAB — LACTIC ACID, PLASMA: Lactic Acid, Venous: 1.1 mmol/L (ref 0.5–2.2)

## 2014-02-01 MED ORDER — PIPERACILLIN-TAZOBACTAM 3.375 G IVPB 30 MIN
3.3750 g | Freq: Once | INTRAVENOUS | Status: AC
  Administered 2014-02-01: 3.375 g via INTRAVENOUS
  Filled 2014-02-01: qty 50

## 2014-02-01 MED ORDER — SODIUM CHLORIDE 0.9 % IV BOLUS (SEPSIS)
500.0000 mL | Freq: Once | INTRAVENOUS | Status: AC
  Administered 2014-02-01: 500 mL via INTRAVENOUS

## 2014-02-01 MED ORDER — INSULIN ASPART 100 UNIT/ML ~~LOC~~ SOLN
0.0000 [IU] | SUBCUTANEOUS | Status: DC
  Administered 2014-02-02 – 2014-02-03 (×4): 1 [IU] via SUBCUTANEOUS
  Administered 2014-02-03 – 2014-02-04 (×4): 2 [IU] via SUBCUTANEOUS
  Administered 2014-02-04 (×2): 1 [IU] via SUBCUTANEOUS
  Administered 2014-02-04: 2 [IU] via SUBCUTANEOUS

## 2014-02-01 MED ORDER — PIPERACILLIN-TAZOBACTAM 3.375 G IVPB
3.3750 g | Freq: Three times a day (TID) | INTRAVENOUS | Status: DC
  Administered 2014-02-02 – 2014-02-06 (×13): 3.375 g via INTRAVENOUS
  Filled 2014-02-01 (×15): qty 50

## 2014-02-01 MED ORDER — ALBUTEROL SULFATE (2.5 MG/3ML) 0.083% IN NEBU
2.5000 mg | INHALATION_SOLUTION | RESPIRATORY_TRACT | Status: DC | PRN
  Administered 2014-02-04 – 2014-02-05 (×3): 2.5 mg via RESPIRATORY_TRACT
  Filled 2014-02-01 (×3): qty 3

## 2014-02-01 MED ORDER — ALBUTEROL SULFATE (2.5 MG/3ML) 0.083% IN NEBU
2.5000 mg | INHALATION_SOLUTION | Freq: Four times a day (QID) | RESPIRATORY_TRACT | Status: DC
  Administered 2014-02-02 (×4): 2.5 mg via RESPIRATORY_TRACT
  Filled 2014-02-01 (×4): qty 3

## 2014-02-01 MED ORDER — VANCOMYCIN HCL 10 G IV SOLR
1250.0000 mg | INTRAVENOUS | Status: DC
  Administered 2014-02-02 – 2014-02-04 (×2): 1250 mg via INTRAVENOUS
  Filled 2014-02-01 (×3): qty 1250

## 2014-02-01 MED ORDER — VANCOMYCIN HCL 10 G IV SOLR
1500.0000 mg | Freq: Once | INTRAVENOUS | Status: AC
  Administered 2014-02-01: 1500 mg via INTRAVENOUS
  Filled 2014-02-01: qty 1500

## 2014-02-01 MED ORDER — IOHEXOL 350 MG/ML SOLN
80.0000 mL | Freq: Once | INTRAVENOUS | Status: AC | PRN
  Administered 2014-02-01: 75 mL via INTRAVENOUS

## 2014-02-01 NOTE — ED Provider Notes (Signed)
Examined immediately upon arrival Patient complain of cough productive of yellow sputum this afternoon accompanied by dyspnea. While in EMS present she became less responsive with Glasgow Coma Score of 10. EMS treated patient with nasopharyngeal airway and supplemental oxygen. Presently patient complains of mild shortness of breath. She denies fever. She is alert and arousable to verbal stimulus Glasgow Coma Score 14. Lungs clear auscultation heart regular rate and rhythm abdomen nondistended nontender extremities without edema  Orlie Dakin, MD 02/01/14 2312

## 2014-02-01 NOTE — ED Notes (Signed)
Attempted to call report. Floor RN unable to accept report.  

## 2014-02-01 NOTE — ED Provider Notes (Signed)
CSN: [id number]     Arrival date & time 02/01/14  1717 History   First MD Initiated Contact with Patient 02/01/14 1728     Chief Complaint  Patient presents with  . Respiratory Distress     (Consider location/radiation/quality/duration/timing/severity/associated sxs/prior Treatment) HPI Comments: Patient with sudden onset shortness of breath with hypoxia down to 60% per EMS.  While en route patient had a brief decrease in consciousness with a GCS of 10.  A NPA was placed and the patient quickly regained consciousness and removed the NPA.    Patient is a 81 y.o. female presenting with shortness of breath. The history is provided by the patient, the EMS personnel and a relative. No language interpreter was used.  Shortness of Breath Severity:  Severe Onset quality:  Sudden Timing:  Constant Progression:  Partially resolved Chronicity:  New Context: not activity, not emotional upset, not known allergens, not pollens and not URI   Relieved by:  Oxygen Worsened by:  Nothing tried Ineffective treatments:  None tried Associated symptoms: cough and syncope   Associated symptoms: no abdominal pain, no chest pain, no fever, no headaches, no vomiting and no wheezing   Cough:    Cough characteristics:  Non-productive   Severity:  Moderate   Onset quality:  Sudden   Duration:  1 day   Timing:  Constant   Progression:  Unchanged   Chronicity:  New Syncope:    Witnessed: yes     Suspicion of head trauma:  No   Past Medical History  Diagnosis Date  . Mild aortic stenosis     a. 10/2010 Echo: Ef 60-65%, no rwma, mild LVH no AS.  Marland Kitchen Chronic back pain   . Peripheral neuropathy   . GERD (gastroesophageal reflux disease)   . Hypertension   . History of pneumonia   . History of bronchitis   . Basal cell carcinoma     a. 2013.  . Non-cardiac chest pain     LHC w/ nl coronaries and nl EF 60% -12/2013  . PAC (premature atrial contraction)   . Heart murmur   . Type II diabetes mellitus      TYPE 2   Past Surgical History  Procedure Laterality Date  . Vesicovaginal fistula closure w/ tah  1964  . US echocardiography  04-26-09    EF 55-60%  . Cardiovascular stress test  05-13-2005    EF 67%  . Knee surgery    . Joint replacement  2000    left knee  . Abdominal hysterectomy  1964  . Toe shortened      right 2nd toe  . Cataract extraction      both eyes  . Mass excision  01/13/2012    Procedure: EXCISION MASS;  Surgeon: Ralene Ok, MD;  Location: [facility];  Service: General;  Laterality: Right;  Excision of Right Back Mass  . Left heart catheterization with coronary angiogram N/A 01/03/2014    Procedure: LEFT HEART CATHETERIZATION WITH CORONARY ANGIOGRAM;  Surgeon: Lorretta Harp, MD;  Location: [facility] CATH LAB;  Service: Cardiovascular;  Laterality: N/A;  . Cardiac catheterization    . Eye surgery      cataract   Family History  Problem Relation Age of Onset  . Other Mother     died @ 39, complications following childbirth  . COPD Father     died @ 76  . Heart failure Father   . Irregular heart beat Sister     1 sister  with PPM.   History  Substance Use Topics  . Smoking status: Former Smoker -- 0.50 packs/day for 5 years    Types: Cigarettes    Quit date: 01/14/1980  . Smokeless tobacco: Never Used  . Alcohol Use: No   OB History    No data available     Review of Systems  Constitutional: Negative for fever.  Respiratory: Positive for cough and shortness of breath. Negative for wheezing.   Cardiovascular: Positive for syncope. Negative for chest pain.  Gastrointestinal: Negative for vomiting and abdominal pain.  Genitourinary: Negative for dysuria, urgency and frequency.  Neurological: Positive for syncope. Negative for dizziness, weakness, numbness and headaches.  All other systems reviewed and are negative.     Allergies  Codeine; Demerol; Statins; and Zetia  Home Medications   Prior to Admission medications   Medication Sig Start Date End  Date Taking? Authorizing Provider  albuterol (PROVENTIL HFA;VENTOLIN HFA) 108 (90 BASE) MCG/ACT inhaler Inhale 1 puff into the lungs every 6 (six) hours as needed for wheezing or shortness of breath.   Yes Historical Provider, MD  aspirin 81 MG tablet Take 81 mg by mouth every Monday, Wednesday, and Friday. Take 1 on Monday, Wednesday, and Friday only   Yes Historical Provider, MD  cholecalciferol (VITAMIN D) 1000 UNITS tablet Take 1,000 Units by mouth daily.   Yes Historical Provider, MD  fluticasone (FLONASE) 50 MCG/ACT nasal spray Place 2 sprays into both nostrils daily. 01/12/14  Yes Delfina Redwood, MD  furosemide (LASIX) 20 MG tablet Take 20 mg by mouth as needed for fluid (Left foot swelling).   Yes Historical Provider, MD  gabapentin (NEURONTIN) 300 MG capsule Take 900 mg by mouth 3 (three) times daily.    Yes Historical Provider, MD  HYDROcodone-acetaminophen (NORCO/VICODIN) 5-325 MG per tablet Take 1-2 tablets by mouth daily as needed for moderate pain. 12/22/13  Yes Charlett Blake, MD  losartan-hydrochlorothiazide (HYZAAR) 50-12.5 MG per tablet Take 1 tablet by mouth daily.  04/08/13  Yes Historical Provider, MD  metFORMIN (GLUCOPHAGE) 1000 MG tablet Take 1 tablet (1,000 mg total) by mouth 2 (two) times daily with a meal. 01/06/14  Yes Brittainy Erie Noe, PA-C  metoprolol succinate (TOPROL-XL) 50 MG 24 hr tablet Take 1 tablet (50 mg total) by mouth 2 (two) times daily. 01/02/14  Yes Darlin Coco, MD  nitroGLYCERIN (NITROSTAT) 0.4 MG SL tablet Place 1 tablet (0.4 mg total) under the tongue every 5 (five) minutes as needed for chest pain. 01/02/14  Yes Darlin Coco, MD  pantoprazole (PROTONIX) 40 MG tablet Take 40 mg by mouth every morning.    Yes Historical Provider, MD  rosuvastatin (CRESTOR) 5 MG tablet Take 5 mg by mouth at bedtime.   Yes Historical Provider, MD  triamcinolone (KENALOG) 0.025 % cream Apply 1 application topically as needed (as needed for leg pain).  08/08/13   Yes Historical Provider, MD  predniSONE (DELTASONE) 10 MG tablet 3 tablets daily for 2 days then decrease by 1 tablet every 2 days until off Patient not taking: Reported on 02/01/2014 01/12/14   Delfina Redwood, MD   BP 110/36 mmHg  Pulse 103  Temp(Src) 98.4 F (36.9 C) (Oral)  Resp 20  Ht 5' 0.5" (1.537 m)  Wt 220 lb 10.9 oz (100.1 kg)  BMI 42.37 kg/m2  SpO2 100% Physical Exam  Constitutional: She is oriented to person, place, and time. She appears well-developed and well-nourished. She appears lethargic. No distress.  HENT:  Head: Normocephalic  and atraumatic.  Eyes: Pupils are equal, round, and reactive to light.  Neck: Normal range of motion.  Cardiovascular: Normal rate, regular rhythm, normal heart sounds and intact distal pulses.   Pulmonary/Chest: Effort normal. No respiratory distress. She has no wheezes. She exhibits no tenderness.  Abdominal: Soft. Bowel sounds are normal. She exhibits no distension. There is no tenderness. There is no rebound and no guarding.  Neurological: She is oriented to person, place, and time. She has normal strength. She appears lethargic. No cranial nerve deficit or sensory deficit. She exhibits normal muscle tone. Coordination and gait normal.  Skin: Skin is warm and dry.  Nursing note and vitals reviewed.   ED Course  Procedures (including critical care time) Labs Review Labs Reviewed  CBC WITH DIFFERENTIAL - Abnormal; Notable for the following:    WBC 15.9 (*)    RBC 3.83 (*)    Hemoglobin 11.9 (*)    Neutro Abs 12.4 (*)    All other components within normal limits  BASIC METABOLIC PANEL - Abnormal; Notable for the following:    Glucose, Bld 207 (*)    BUN 24 (*)    Creatinine, Ser 1.19 (*)    GFR calc non Af Amer 42 (*)    GFR calc Af Amer 48 (*)    All other components within normal limits  D-DIMER, QUANTITATIVE - Abnormal; Notable for the following:    D-Dimer, Quant 4.07 (*)    All other components within normal limits   BASIC METABOLIC PANEL - Abnormal; Notable for the following:    Glucose, Bld 110 (*)    Calcium 8.0 (*)    GFR calc non Af Amer 50 (*)    GFR calc Af Amer 58 (*)    All other components within normal limits  CBC - Abnormal; Notable for the following:    RBC 3.28 (*)    Hemoglobin 10.1 (*)    HCT 32.7 (*)    All other components within normal limits  GLUCOSE, CAPILLARY - Abnormal; Notable for the following:    Glucose-Capillary 121 (*)    All other components within normal limits  CBG MONITORING, ED - Abnormal; Notable for the following:    Glucose-Capillary 185 (*)    All other components within normal limits  CULTURE, BLOOD (ROUTINE X 2)  CULTURE, BLOOD (ROUTINE X 2)  BRAIN NATRIURETIC PEPTIDE  TROPONIN I  LACTIC ACID, PLASMA  GLUCOSE, CAPILLARY  HEMOGLOBIN A1C    Imaging Review Ct Angio Chest Pe W/cm &/or Wo Cm  02/01/2014   CLINICAL DATA:  81 year old with sudden onset of respiratory distress and low oxygen saturations.  EXAM: CT ANGIOGRAPHY CHEST WITH CONTRAST  TECHNIQUE: Multidetector CT imaging of the chest was performed using the standard protocol during bolus administration of intravenous contrast. Multiplanar CT image reconstructions and MIPs were obtained to evaluate the vascular anatomy.  CONTRAST:  19mL OMNIPAQUE IOHEXOL 350 MG/ML SOLN  COMPARISON:  Chest radiograph 02/01/2014.  FINDINGS: There is no evidence for a pulmonary embolism. No significant chest lymphadenopathy. No significant pericardial or pleural fluid. Ascending thoracic aorta is mildly enlarged measuring up to 3.7 cm. Proximal great vessels are patent. Normal caliber of the descending thoracic aorta. Proximal abdominal aorta is patent.  There is motion artifact on this examination. There is a 1.4 cm calcified gallstone. Trachea and mainstem bronchi are patent. There is an irregular nodular lesion involving the posterior right middle lobe on sequence 406, image 64. This area roughly measures up to 1.3  cm. The  margins of this density are poorly defined and suspect this is an infectious or inflammatory process. Question a 0.3 cm nodule the right upper lobe on sequence 406, image 31. There are perivascular parenchymal densities in the left lower lobe on sequence 406, image 83. Questionable nodular lesion in the left lower lobe on image 69 roughly measuring 0.4 cm. Subtle nodular density in the right lower lobe measures 0.3 cm on image 53. There are additional small nodules in the right lower lobe on images 73 and 74.  No acute bone abnormality.  Review of the MIP images confirms the above findings.  IMPRESSION: Negative for a pulmonary embolism.  Small scattered patchy and nodular densities in both lungs. Findings could represent an acute infectious or inflammatory process. Recommend a 3 month follow up to ensure resolution of the largest area in the right middle lobe.  Cholelithiasis.  Mild enlargement of the ascending thoracic aorta measuring up to 3.7 cm. Recommend annual imaging followup by CTA or MRA. This recommendation follows 2010 ACCF/AHA/AATS/ACR/ASA/SCA/SCAI/SIR/STS/SVM Guidelines for the Diagnosis and Management of Patients with Thoracic Aortic Disease. Circulation. 2010; 121: M196-Q229   Electronically Signed   By: Markus Daft M.D.   On: 02/01/2014 21:51   Dg Chest Portable 1 View  02/01/2014   CLINICAL DATA:  Severe respiratory distress and low oxygen saturations.  EXAM: PORTABLE CHEST - 1 VIEW  COMPARISON:  01/09/2014  FINDINGS: Single view of the chest demonstrates clear lungs. There is no significant pulmonary edema or airspace disease. Heart size is within normal limits and stable. Again noted are atherosclerotic calcifications at the aortic arch. Patient is mildly rotated towards the left.  IMPRESSION: No acute chest findings.   Electronically Signed   By: Markus Daft M.D.   On: 02/01/2014 17:52     EKG Interpretation   Date/Time:  Wednesday February 01 2014 17:20:12 EST Ventricular Rate:  112 PR  Interval:  186 QRS Duration: 79 QT Interval:  318 QTC Calculation: 434 R Axis:   58 Text Interpretation:  Sinus or ectopic atrial tachycardia Atrial premature  complex No significant change since last tracing Confirmed by Winfred Leeds   MD, SAM [phone]) on 02/01/2014 5:41:57 PM      MDM   Final diagnoses:  Shortness of breath  HCAP (healthcare-associated pneumonia)    Patient is an 81 year old Caucasian female with pertinent past medical history of diabetes, aortic stenosis, and hypertension who comes to the Emergency Department today with significant shortness of breath. Physical exam as above. Upon arrival patient was able to be weaned down to 2 L nasal cannula. She does not wear oxygen at home. Whenever her oxygen was turned off she would drop down to 88% on room air. Initial differential is concerning for a PE with a sudden onset shortness of breath and hypoxia. However the patient had a similar presentation a month ago and was treated for bronchitis with significant improvement. As result I feel bronchitis or pneumonia is more likely than a PE at this time and a negative d-dimer should be sufficient to rule out PE. Initial workup included a CBC, BMP, BNP, troponin, d-dimer, EKG, and a chest x-ray. EKG is detailed above. Chest x-ray demonstrated no consolidations. There is no evidence of pneumothorax. Troponin was negative at 0.03. BNP was 85. BMP with a creatinine of 1.19 and was otherwise unremarkable. CBC had a white count of 15.9 otherwise unremarkable. Patient's d-dimer was elevated at 4.07 as result a CT PE study was obtained. CT  PE study demonstrated no evidence of pulmonary embolism however there were patchy nodular densities in both lungs concerning for an infectious or inflammatory process. With the patient's elevated white count and oxygen requirement this is concerning for HCAP. As result she was treated with vancomycin and Zosyn. Blood cultures 2 were obtained. Patient was felt to  require admission to the hospital. Patient was admitted to the hospitalist service in good condition. Labs and imaging review by myself and considered in medical decision-making. Imaging was interpreted by radiology. Care was discussed with my attending Dr. Winfred Leeds.     Katheren Shams, MD 02/02/14 1126  Orlie Dakin, MD 02/02/14 [phone]

## 2014-02-01 NOTE — Progress Notes (Signed)
ANTIBIOTIC CONSULT NOTE - INITIAL  Pharmacy Consult for vancomycin Indication: rule out pneumonia  Allergies  Allergen Reactions  . Codeine Nausea And Vomiting  . Demerol Other (See Comments)    Drunk feeling"  . Statins Other (See Comments)    "hurt"   . Zetia [Ezetimibe] Other (See Comments)    hurt    Patient Measurements: Height: 5\' 3"  (160 cm) Weight: 220 lb (99.791 kg) IBW/kg (Calculated) : 52.4  Vital Signs: Temp: 97.5 F (36.4 C) (01/20 2302) Temp Source: Oral (01/20 2302) BP: 100/53 mmHg (01/20 2302) Pulse Rate: 85 (01/20 2302)  Labs:  Recent Labs  02/01/14 1730  WBC 15.9*  HGB 11.9*  PLT 232  CREATININE 1.19*   Estimated Creatinine Clearance: 41.8 mL/min (by C-G formula based on Cr of 1.19).   Microbiology: Recent Results (from the past 720 hour(s))  Rapid strep screen     Status: None   Collection Time: 01/10/14 12:38 AM  Result Value Ref Range Status   Streptococcus, Group A Screen (Direct) NEGATIVE NEGATIVE Final    Comment: (NOTE) A Rapid Antigen test may result negative if the antigen level in the sample is below the detection level of this test. The FDA has not cleared this test as a stand-alone test therefore the rapid antigen negative result has reflexed to a Group A Strep culture.   Culture, Group A Strep     Status: None   Collection Time: 01/10/14 12:38 AM  Result Value Ref Range Status   Specimen Description THROAT  Final   Special Requests NONE  Final   Culture   Final    No Beta Hemolytic Streptococci Isolated Performed at [facility]    Report Status 01/11/2014 FINAL  Final    Medical History: Past Medical History  Diagnosis Date  . Mild aortic stenosis     a. 10/2010 Echo: Ef 60-65%, no rwma, mild LVH no AS.  Marland Kitchen Chronic back pain   . Peripheral neuropathy   . GERD (gastroesophageal reflux disease)   . Hypertension   . History of pneumonia   . History of bronchitis   . Basal cell carcinoma     a. 2013.  .  Non-cardiac chest pain     LHC w/ nl coronaries and nl EF 60% -12/2013  . PAC (premature atrial contraction)   . Heart murmur   . Type II diabetes mellitus     TYPE 2     Assessment: 81yo female c/o sudden onset of respiratory distress, EMS reports sats of 56% on RA, now 100% on NRB, CT negative for PE though could reflect infectious process, to begin IV ABX.  Goal of Therapy:  Vancomycin trough level 15-20 mcg/ml  Plan:  Rec'd vanc 1500mg  IV in ED; will continue with vancomycin 1250mg  IV Q24H and monitor CBC, Cx, levels prn.  Wynona Neat, PharmD, BCPS  02/01/2014,11:33 PM

## 2014-02-01 NOTE — H&P (Signed)
[facility] Admission History and Physical       [name] [name] MWN:[id number] DOB: 09-09-1930 DOA: 02/01/2014  Referring physician: EDP PCP: Cari Caraway, MD  Specialists:   Chief Complaint:  SOB  HPI: [name] [name] is a 81 y.o. female with a history of Mild AS, HTN, DM2  Who presents to the ED with complaints of sudden onset of severe SOB at 2 pm.  EMS was called and her O2 saturations were found at 60% and she was placed on NRB O2 and taken to the ED.   In the ED, she was evaluated and a D-Dimer was found to be elevated and a CTA of the Chest was performed and was negative for a Pulmonary embolus but revealed signs of an infectious versus inflammatory process in both lungs.   Her WBC count was elevated at 15.9, and she was placed on antibiotic coverage for HCAP since she had a recent hospitalization on 12/18 for Bronchitis.    She denies having any fevers or chills but reports having an episode of severe coughing.     Review of Systems:  Constitutional: No Weight Loss, No Weight Gain, Night Sweats, Fevers, Chills, Dizziness, Fatigue, or Generalized Weakness HEENT: No Headaches, Difficulty Swallowing,Tooth/Dental Problems,Sore Throat,  No Sneezing, Rhinitis, Ear Ache, Nasal Congestion, or Post Nasal Drip,  Cardio-vascular:  No Chest pain, Orthopnea, PND, Edema in Lower Extremities, Anasarca, Dizziness, Palpitations  Resp: +Dyspnea, No DOE, No Productive Cough, No Non-Productive Cough, No Hemoptysis, No Wheezing.    GI: No Heartburn, Indigestion, Abdominal Pain, Nausea, Vomiting, Diarrhea, Hematemesis, Hematochezia, Melena, Change in Bowel Habits,  Loss of Appetite  GU: No Dysuria, Change in Color of Urine, No Urgency or Frequency, No Flank pain.  Musculoskeletal: No Joint Pain or Swelling, No Decreased Range of Motion, No Back Pain.  Neurologic: No Syncope, No Seizures, Muscle Weakness, Paresthesia, Vision Disturbance or Loss, No Diplopia, No Vertigo, No Difficulty Walking,  Skin:  No Rash or Lesions. Psych: No Change in Mood or Affect, No Depression or Anxiety, No Memory loss, No Confusion, or Hallucinations   Past Medical History  Diagnosis Date  . Mild aortic stenosis     a. 10/2010 Echo: Ef 60-65%, no rwma, mild LVH no AS.  Marland Kitchen Chronic back pain   . Peripheral neuropathy   . GERD (gastroesophageal reflux disease)   . Hypertension   . History of pneumonia   . History of bronchitis   . Basal cell carcinoma     a. 2013.  . Non-cardiac chest pain     LHC w/ nl coronaries and nl EF 60% -12/2013  . PAC (premature atrial contraction)   . Heart murmur   . Type II diabetes mellitus     TYPE 2      Past Surgical History  Procedure Laterality Date  . Vesicovaginal fistula closure w/ tah  1964  . US echocardiography  04-26-09    EF 55-60%  . Cardiovascular stress test  05-13-2005    EF 67%  . Knee surgery    . Joint replacement  2000    left knee  . Abdominal hysterectomy  1964  . Toe shortened      right 2nd toe  . Cataract extraction      both eyes  . Mass excision  01/13/2012    Procedure: EXCISION MASS;  Surgeon: Ralene Ok, MD;  Location: [facility];  Service: General;  Laterality: Right;  Excision of Right Back Mass  . Left heart catheterization with coronary angiogram N/A  01/03/2014    Procedure: LEFT HEART CATHETERIZATION WITH CORONARY ANGIOGRAM;  Surgeon: Lorretta Harp, MD;  Location: [facility] CATH LAB;  Service: Cardiovascular;  Laterality: N/A;  . Cardiac catheterization    . Eye surgery      cataract       Prior to Admission medications   Medication Sig Start Date End Date Taking? Authorizing Provider  albuterol (PROVENTIL HFA;VENTOLIN HFA) 108 (90 BASE) MCG/ACT inhaler Inhale 1 puff into the lungs every 6 (six) hours as needed for wheezing or shortness of breath.   Yes Historical Provider, MD  aspirin 81 MG tablet Take 81 mg by mouth every Monday, Wednesday, and Friday. Take 1 on Monday, Wednesday, and Friday only   Yes Historical Provider,  MD  cholecalciferol (VITAMIN D) 1000 UNITS tablet Take 1,000 Units by mouth daily.   Yes Historical Provider, MD  fluticasone (FLONASE) 50 MCG/ACT nasal spray Place 2 sprays into both nostrils daily. 01/12/14  Yes Delfina Redwood, MD  furosemide (LASIX) 20 MG tablet Take 20 mg by mouth as needed for fluid (Left foot swelling).   Yes Historical Provider, MD  gabapentin (NEURONTIN) 300 MG capsule Take 900 mg by mouth 3 (three) times daily.    Yes Historical Provider, MD  HYDROcodone-acetaminophen (NORCO/VICODIN) 5-325 MG per tablet Take 1-2 tablets by mouth daily as needed for moderate pain. 12/22/13  Yes Charlett Blake, MD  losartan-hydrochlorothiazide (HYZAAR) 50-12.5 MG per tablet Take 1 tablet by mouth daily.  04/08/13  Yes Historical Provider, MD  metFORMIN (GLUCOPHAGE) 1000 MG tablet Take 1 tablet (1,000 mg total) by mouth 2 (two) times daily with a meal. 01/06/14  Yes Brittainy Erie Noe, PA-C  metoprolol succinate (TOPROL-XL) 50 MG 24 hr tablet Take 1 tablet (50 mg total) by mouth 2 (two) times daily. 01/02/14  Yes Darlin Coco, MD  nitroGLYCERIN (NITROSTAT) 0.4 MG SL tablet Place 1 tablet (0.4 mg total) under the tongue every 5 (five) minutes as needed for chest pain. 01/02/14  Yes Darlin Coco, MD  pantoprazole (PROTONIX) 40 MG tablet Take 40 mg by mouth every morning.    Yes Historical Provider, MD  rosuvastatin (CRESTOR) 5 MG tablet Take 5 mg by mouth at bedtime.   Yes Historical Provider, MD  triamcinolone (KENALOG) 0.025 % cream Apply 1 application topically as needed (as needed for leg pain).  08/08/13  Yes Historical Provider, MD  predniSONE (DELTASONE) 10 MG tablet 3 tablets daily for 2 days then decrease by 1 tablet every 2 days until off Patient not taking: Reported on 02/01/2014 01/12/14   Delfina Redwood, MD      Allergies  Allergen Reactions  . Codeine Nausea And Vomiting  . Demerol Other (See Comments)    Drunk feeling"  . Statins Other (See Comments)     "hurt"   . Zetia [Ezetimibe] Other (See Comments)    hurt     Social History:  reports that she quit smoking about 34 years ago. Her smoking use included Cigarettes. She has a 2.5 pack-year smoking history. She has never used smokeless tobacco. She reports that she does not drink alcohol or use illicit drugs.     Family History  Problem Relation Age of Onset  . Other Mother     died @ 41, complications following childbirth  . COPD Father     died @ 52  . Heart failure Father   . Irregular heart beat Sister     1 sister with PPM.  Physical Exam:  GEN:  Pleasant Elderly Obese  81 y.o. Caucasian female  examined  and in no acute distress; cooperative with exam Filed Vitals:   02/01/14 2045 02/01/14 2143 02/01/14 2249 02/01/14 2302  BP: 135/53 127/45 125/47 100/53  Pulse: 83 94 82 85  Temp:    97.5 F (36.4 C)  TempSrc:    Oral  Resp: 19 18 18 16   Height:      Weight:      SpO2: 100% 100% 100% 100%   Blood pressure 100/53, pulse 85, temperature 97.5 F (36.4 C), temperature source Oral, resp. rate 16, height 5\' 3"  (1.6 m), weight 99.791 kg (220 lb), SpO2 100 %. PSYCH: She is alert and oriented x4; does not appear anxious does not appear depressed; affect is normal HEENT: Normocephalic and Atraumatic, Mucous membranes pink; PERRLA; EOM intact; Fundi:  Benign;  No scleral icterus, Nares: Patent, Oropharynx: Clear, Fair Dentition,    Neck:  FROM, No Cervical Lymphadenopathy nor Thyromegaly or Carotid Bruit; No JVD; Breasts:: Not examined CHEST WALL: No tenderness CHEST: Normal respiration, clear to auscultation bilaterally HEART: Regular rate and rhythm; no murmurs rubs or gallops BACK: No kyphosis or scoliosis; No CVA tenderness ABDOMEN: Positive Bowel Sounds, Obese, Soft Non-Tender; No Masses, No Organomegaly, No Pannus; No Intertriginous candida. Rectal Exam: Not done EXTREMITIES: No  Cyanosis, Clubbing, or Edema; No Ulcerations. Genitalia: not examined PULSES: 2+  and symmetric SKIN: Normal hydration no rash or ulceration CNS:  Alert and Oriented x 4, No Focal Deficits Vascular: pulses palpable throughout    Labs on Admission:  Basic Metabolic Panel:  Recent Labs Lab 02/01/14 1730  NA 140  K 4.1  CL 99  CO2 30  GLUCOSE 207*  BUN 24*  CREATININE 1.19*  CALCIUM 8.8   Liver Function Tests: No results for input(s): AST, ALT, ALKPHOS, BILITOT, PROT, ALBUMIN in the last 168 hours. No results for input(s): LIPASE, AMYLASE in the last 168 hours. No results for input(s): AMMONIA in the last 168 hours. CBC:  Recent Labs Lab 02/01/14 1730  WBC 15.9*  NEUTROABS 12.4*  HGB 11.9*  HCT 38.1  MCV 99.5  PLT 232   Cardiac Enzymes:  Recent Labs Lab 02/01/14 1730  TROPONINI <0.03    BNP (last 3 results) No results for input(s): PROBNP in the last 8760 hours. CBG:  Recent Labs Lab 02/01/14 1723  GLUCAP 185*    Radiological Exams on Admission: Ct Angio Chest Pe W/cm &/or Wo Cm  02/01/2014   CLINICAL DATA:  81 year old with sudden onset of respiratory distress and low oxygen saturations.  EXAM: CT ANGIOGRAPHY CHEST WITH CONTRAST  TECHNIQUE: Multidetector CT imaging of the chest was performed using the standard protocol during bolus administration of intravenous contrast. Multiplanar CT image reconstructions and MIPs were obtained to evaluate the vascular anatomy.  CONTRAST:  43mL OMNIPAQUE IOHEXOL 350 MG/ML SOLN  COMPARISON:  Chest radiograph 02/01/2014.  FINDINGS: There is no evidence for a pulmonary embolism. No significant chest lymphadenopathy. No significant pericardial or pleural fluid. Ascending thoracic aorta is mildly enlarged measuring up to 3.7 cm. Proximal great vessels are patent. Normal caliber of the descending thoracic aorta. Proximal abdominal aorta is patent.  There is motion artifact on this examination. There is a 1.4 cm calcified gallstone. Trachea and mainstem bronchi are patent. There is an irregular nodular lesion  involving the posterior right middle lobe on sequence 406, image 64. This area roughly measures up to 1.3 cm. The margins of this density are  poorly defined and suspect this is an infectious or inflammatory process. Question a 0.3 cm nodule the right upper lobe on sequence 406, image 31. There are perivascular parenchymal densities in the left lower lobe on sequence 406, image 83. Questionable nodular lesion in the left lower lobe on image 69 roughly measuring 0.4 cm. Subtle nodular density in the right lower lobe measures 0.3 cm on image 53. There are additional small nodules in the right lower lobe on images 73 and 74.  No acute bone abnormality.  Review of the MIP images confirms the above findings.  IMPRESSION: Negative for a pulmonary embolism.  Small scattered patchy and nodular densities in both lungs. Findings could represent an acute infectious or inflammatory process. Recommend a 3 month follow up to ensure resolution of the largest area in the right middle lobe.  Cholelithiasis.  Mild enlargement of the ascending thoracic aorta measuring up to 3.7 cm. Recommend annual imaging followup by CTA or MRA. This recommendation follows 2010 ACCF/AHA/AATS/ACR/ASA/SCA/SCAI/SIR/STS/SVM Guidelines for the Diagnosis and Management of Patients with Thoracic Aortic Disease. Circulation. 2010; 121: J825-K539   Electronically Signed   By: Markus Daft M.D.   On: 02/01/2014 21:51   Dg Chest Portable 1 View  02/01/2014   CLINICAL DATA:  Severe respiratory distress and low oxygen saturations.  EXAM: PORTABLE CHEST - 1 VIEW  COMPARISON:  01/09/2014  FINDINGS: Single view of the chest demonstrates clear lungs. There is no significant pulmonary edema or airspace disease. Heart size is within normal limits and stable. Again noted are atherosclerotic calcifications at the aortic arch. Patient is mildly rotated towards the left.  IMPRESSION: No acute chest findings.   Electronically Signed   By: Markus Daft M.D.   On: 02/01/2014  17:52     EKG: Independently reviewed.    Assessment/Plan:   81 y.o. female with  Principal Problem:   1.    HCAP (healthcare-associated pneumonia)   IV Vancomycin and Zosyn   Albuterol Nebs   O2 PRN   Monitor O2 Sats   Active Problems:   2.   Acute respiratory failure   O2 PRN   Monitor O2 Sats    3.   Mild aortic stenosis   Stable      4.   Type II diabetes mellitus   Hold Metformin x 48 hrs   SSI coverage PRN `  Check HbA1C in AM    5.   Essential hypertension   Continue Losartan /HCTZ       6.   Leukocytosis- due to #1   Monitor Trend    7.   Renal insufficiency   Monitor BUN/Cr    8.   DVT Prophylaxis   Lovenox     Code Status:    FULL CODE Family Communication:    Sister at Bedside Disposition Plan:   Inpatient MED/Surg  Time spent:   [place] [facility] Pager [phone]   If [place] Please Contact the Day Rounding Team MD for [facility]  If 7PM-7AM, Please Contact Night-Floor Coverage  [email or web address] Password [email or web address] 02/01/2014, 11:07 PM

## 2014-02-01 NOTE — ED Notes (Signed)
Per EMS: pt coming from home with c/o sudden onset of respiratory distress. Upon ems arrival pt had O2 sats of 56% on RA, pt placed on NRB 15lpm sats increased to 91%. Pt started to become more lethargic, GCS decreased to 10, NPA placed and pt BVM. Pt became more alert, pulled out NPA then split up blood. Pt was given 4 mg Zofran. Upon EMS arrival to ED pt was A&Ox4, GCS 15, 100% on NRB

## 2014-02-01 NOTE — ED Notes (Signed)
CT called about delay for CT angio. CT states she is the next pt to be transported for scan

## 2014-02-02 ENCOUNTER — Encounter (HOSPITAL_COMMUNITY): Payer: Self-pay | Admitting: Surgery

## 2014-02-02 LAB — CBC
HCT: 32.7 % — ABNORMAL LOW (ref 36.0–46.0)
HEMOGLOBIN: 10.1 g/dL — AB (ref 12.0–15.0)
MCH: 30.8 pg (ref 26.0–34.0)
MCHC: 30.9 g/dL (ref 30.0–36.0)
MCV: 99.7 fL (ref 78.0–100.0)
Platelets: 197 10*3/uL (ref 150–400)
RBC: 3.28 MIL/uL — AB (ref 3.87–5.11)
RDW: 13.4 % (ref 11.5–15.5)
WBC: 10 10*3/uL (ref 4.0–10.5)

## 2014-02-02 LAB — GLUCOSE, CAPILLARY
GLUCOSE-CAPILLARY: 117 mg/dL — AB (ref 70–99)
GLUCOSE-CAPILLARY: 120 mg/dL — AB (ref 70–99)
GLUCOSE-CAPILLARY: 151 mg/dL — AB (ref 70–99)
GLUCOSE-CAPILLARY: 77 mg/dL (ref 70–99)
Glucose-Capillary: 121 mg/dL — ABNORMAL HIGH (ref 70–99)
Glucose-Capillary: 128 mg/dL — ABNORMAL HIGH (ref 70–99)

## 2014-02-02 LAB — HEMOGLOBIN A1C
HEMOGLOBIN A1C: 7.5 % — AB (ref <5.7)
Mean Plasma Glucose: 169 mg/dL — ABNORMAL HIGH (ref <117)

## 2014-02-02 LAB — BASIC METABOLIC PANEL
ANION GAP: 8 (ref 5–15)
BUN: 21 mg/dL (ref 6–23)
CO2: 31 mmol/L (ref 19–32)
Calcium: 8 mg/dL — ABNORMAL LOW (ref 8.4–10.5)
Chloride: 102 mEq/L (ref 96–112)
Creatinine, Ser: 1.02 mg/dL (ref 0.50–1.10)
GFR calc Af Amer: 58 mL/min — ABNORMAL LOW (ref >90)
GFR calc non Af Amer: 50 mL/min — ABNORMAL LOW (ref >90)
Glucose, Bld: 110 mg/dL — ABNORMAL HIGH (ref 70–99)
POTASSIUM: 4.8 mmol/L (ref 3.5–5.1)
Sodium: 141 mmol/L (ref 135–145)

## 2014-02-02 MED ORDER — FLUTICASONE PROPIONATE 50 MCG/ACT NA SUSP
2.0000 | Freq: Every day | NASAL | Status: DC
  Administered 2014-02-02 – 2014-02-07 (×6): 2 via NASAL
  Filled 2014-02-02: qty 16

## 2014-02-02 MED ORDER — LOSARTAN POTASSIUM-HCTZ 50-12.5 MG PO TABS: 1.0000 | ORAL_TABLET | Freq: Every day | ORAL | Status: DC

## 2014-02-02 MED ORDER — ONDANSETRON HCL 4 MG/2ML IJ SOLN
4.0000 mg | Freq: Four times a day (QID) | INTRAMUSCULAR | Status: DC | PRN
  Administered 2014-02-03: 4 mg via INTRAVENOUS
  Filled 2014-02-02: qty 2

## 2014-02-02 MED ORDER — FUROSEMIDE 20 MG PO TABS
20.0000 mg | ORAL_TABLET | ORAL | Status: DC | PRN
  Filled 2014-02-02: qty 1

## 2014-02-02 MED ORDER — LOSARTAN POTASSIUM 50 MG PO TABS
50.0000 mg | ORAL_TABLET | Freq: Every day | ORAL | Status: DC
  Filled 2014-02-02: qty 1

## 2014-02-02 MED ORDER — ENOXAPARIN SODIUM 40 MG/0.4ML ~~LOC~~ SOLN
40.0000 mg | SUBCUTANEOUS | Status: DC
  Administered 2014-02-03 – 2014-02-07 (×5): 40 mg via SUBCUTANEOUS
  Filled 2014-02-02 (×5): qty 0.4

## 2014-02-02 MED ORDER — HYDROMORPHONE HCL 1 MG/ML IJ SOLN: 0.5000 mg | INTRAMUSCULAR | Status: DC | PRN

## 2014-02-02 MED ORDER — ENOXAPARIN SODIUM 30 MG/0.3ML ~~LOC~~ SOLN
30.0000 mg | SUBCUTANEOUS | Status: DC
  Administered 2014-02-02: 30 mg via SUBCUTANEOUS
  Filled 2014-02-02: qty 0.3

## 2014-02-02 MED ORDER — ROSUVASTATIN CALCIUM 5 MG PO TABS
5.0000 mg | ORAL_TABLET | Freq: Every day | ORAL | Status: DC
  Administered 2014-02-02 – 2014-02-06 (×5): 5 mg via ORAL
  Filled 2014-02-02 (×6): qty 1

## 2014-02-02 MED ORDER — GABAPENTIN 300 MG PO CAPS
600.0000 mg | ORAL_CAPSULE | Freq: Two times a day (BID) | ORAL | Status: DC
  Administered 2014-02-02 – 2014-02-07 (×11): 600 mg via ORAL
  Filled 2014-02-02 (×12): qty 2

## 2014-02-02 MED ORDER — ACETAMINOPHEN 650 MG RE SUPP: 650.0000 mg | Freq: Four times a day (QID) | RECTAL | Status: DC | PRN

## 2014-02-02 MED ORDER — ONDANSETRON HCL 4 MG PO TABS: 4.0000 mg | ORAL_TABLET | Freq: Four times a day (QID) | ORAL | Status: DC | PRN

## 2014-02-02 MED ORDER — ALUM & MAG HYDROXIDE-SIMETH 200-200-20 MG/5ML PO SUSP: 30.0000 mL | Freq: Four times a day (QID) | ORAL | Status: DC | PRN

## 2014-02-02 MED ORDER — ASPIRIN EC 81 MG PO TBEC
81.0000 mg | DELAYED_RELEASE_TABLET | ORAL | Status: DC
  Administered 2014-02-03 – 2014-02-06 (×2): 81 mg via ORAL
  Filled 2014-02-02 (×2): qty 1

## 2014-02-02 MED ORDER — PANTOPRAZOLE SODIUM 40 MG PO TBEC
40.0000 mg | DELAYED_RELEASE_TABLET | Freq: Every morning | ORAL | Status: DC
  Administered 2014-02-02 – 2014-02-07 (×6): 40 mg via ORAL
  Filled 2014-02-02 (×5): qty 1

## 2014-02-02 MED ORDER — NITROGLYCERIN 0.4 MG SL SUBL: 0.4000 mg | SUBLINGUAL_TABLET | SUBLINGUAL | Status: DC | PRN

## 2014-02-02 MED ORDER — GABAPENTIN 300 MG PO CAPS
900.0000 mg | ORAL_CAPSULE | Freq: Three times a day (TID) | ORAL | Status: DC
  Filled 2014-02-02 (×3): qty 3

## 2014-02-02 MED ORDER — SODIUM CHLORIDE 0.9 % IV SOLN
INTRAVENOUS | Status: DC
  Administered 2014-02-04: 01:00:00 via INTRAVENOUS

## 2014-02-02 MED ORDER — ACETAMINOPHEN 325 MG PO TABS: 650.0000 mg | ORAL_TABLET | Freq: Four times a day (QID) | ORAL | Status: DC | PRN

## 2014-02-02 MED ORDER — HYDROCHLOROTHIAZIDE 12.5 MG PO CAPS
12.5000 mg | ORAL_CAPSULE | Freq: Every day | ORAL | Status: DC
  Filled 2014-02-02: qty 1

## 2014-02-02 MED ORDER — METOPROLOL SUCCINATE ER 50 MG PO TB24
50.0000 mg | ORAL_TABLET | Freq: Two times a day (BID) | ORAL | Status: DC
  Administered 2014-02-02 – 2014-02-07 (×10): 50 mg via ORAL
  Filled 2014-02-02 (×12): qty 1

## 2014-02-02 MED ORDER — ALBUTEROL SULFATE (2.5 MG/3ML) 0.083% IN NEBU
2.5000 mg | INHALATION_SOLUTION | Freq: Three times a day (TID) | RESPIRATORY_TRACT | Status: DC
  Administered 2014-02-03: 2.5 mg via RESPIRATORY_TRACT
  Filled 2014-02-02: qty 3

## 2014-02-02 MED ORDER — OXYCODONE HCL 5 MG PO TABS
5.0000 mg | ORAL_TABLET | ORAL | Status: DC | PRN
  Administered 2014-02-04: 5 mg via ORAL
  Filled 2014-02-02: qty 1

## 2014-02-02 MED ORDER — VITAMIN D3 25 MCG (1000 UNIT) PO TABS
1000.0000 [IU] | ORAL_TABLET | Freq: Every day | ORAL | Status: DC
  Administered 2014-02-02 – 2014-02-07 (×6): 1000 [IU] via ORAL
  Filled 2014-02-02 (×6): qty 1

## 2014-02-02 NOTE — Progress Notes (Signed)
Utilization review completed. Indie Nickerson, RN, BSN. 

## 2014-02-02 NOTE — Progress Notes (Signed)
PROGRESS NOTE  [name] [name] ZTI:[id number] DOB: 1930-07-05 DOA: 02/01/2014 PCP: Cari Caraway, MD  Brief history 81 year old female with history of hypertension, diabetes mellitus, mild aortic stenosis, atypical chest pain with normal heart catheterization 01/03/2014 presented with sudden onset of shortness of breath at 2 PM on 02/01/2014. The patient states that she had a long "coughing spell" during which, the patient had some epistaxis. Associated with her coughing spell, the patient became increasingly short of breath. EMS was activated. Oxygen saturation was noted to be 60%, and the patient was placed on a nonrebreather. CT angiogram of the chest in the emergency department was negative for pulmonary embolus but showed scattered patchy and nodular densities in the bilateral lungs which may represent acute infectious or inflammatory process. The patient was admitted and treated for HCAP. Assessment/Plan: HCAP -Continue vancomycin and Zosyn pending culture data -Continue bronchodilators -Pulmonary hygiene Acute respiratory failure -Presently stable on 2L -secondary to HCAP Atypical chest pain -Patient had normal heart catheterization on 01/03/2014 -EKG without any ST-T wave changes -CTA chest negative for pulmonary embolus Atrial Dysrthymia -Patient recently had a two-week event monitor which was negative for atrial fibrillation -Showed the patient had frequent PACs Diabetes mellitus type 2 -Hemoglobin A1c 7.0 -NovoLog sliding scale Hypertension -Continue metoprolol succinate -Hold losartan and HCTZ due to soft BP Hyperlipidemia -continue Crestor   Family Communication:   Pt at beside Disposition Plan:   Home when medically stable       Procedures/Studies: Dg Chest 2 View (if Patient Has Fever And/or Copd)  01/09/2014   CLINICAL DATA:  Cough and congestion.  Fever.  EXAM: CHEST  2 VIEW  COMPARISON:  05/05/2013  FINDINGS: Multiple leads overlie the patient.  Stable cardiac and mediastinal contours. No large consolidative pulmonary opacities. No pleural effusion or pneumothorax. Mid thoracic spine degenerative change.  IMPRESSION: No acute cardiopulmonary process.   Electronically Signed   By: Lovey Newcomer M.D.   On: 01/09/2014 19:18   Ct Angio Chest Pe W/cm &/or Wo Cm  02/01/2014   CLINICAL DATA:  81 year old with sudden onset of respiratory distress and low oxygen saturations.  EXAM: CT ANGIOGRAPHY CHEST WITH CONTRAST  TECHNIQUE: Multidetector CT imaging of the chest was performed using the standard protocol during bolus administration of intravenous contrast. Multiplanar CT image reconstructions and MIPs were obtained to evaluate the vascular anatomy.  CONTRAST:  51mL OMNIPAQUE IOHEXOL 350 MG/ML SOLN  COMPARISON:  Chest radiograph 02/01/2014.  FINDINGS: There is no evidence for a pulmonary embolism. No significant chest lymphadenopathy. No significant pericardial or pleural fluid. Ascending thoracic aorta is mildly enlarged measuring up to 3.7 cm. Proximal great vessels are patent. Normal caliber of the descending thoracic aorta. Proximal abdominal aorta is patent.  There is motion artifact on this examination. There is a 1.4 cm calcified gallstone. Trachea and mainstem bronchi are patent. There is an irregular nodular lesion involving the posterior right middle lobe on sequence 406, image 64. This area roughly measures up to 1.3 cm. The margins of this density are poorly defined and suspect this is an infectious or inflammatory process. Question a 0.3 cm nodule the right upper lobe on sequence 406, image 31. There are perivascular parenchymal densities in the left lower lobe on sequence 406, image 83. Questionable nodular lesion in the left lower lobe on image 69 roughly measuring 0.4 cm. Subtle nodular density in the right lower lobe measures 0.3 cm on image 53. There are additional small nodules in  the right lower lobe on images 73 and 74.  No acute bone  abnormality.  Review of the MIP images confirms the above findings.  IMPRESSION: Negative for a pulmonary embolism.  Small scattered patchy and nodular densities in both lungs. Findings could represent an acute infectious or inflammatory process. Recommend a 3 month follow up to ensure resolution of the largest area in the right middle lobe.  Cholelithiasis.  Mild enlargement of the ascending thoracic aorta measuring up to 3.7 cm. Recommend annual imaging followup by CTA or MRA. This recommendation follows 2010 ACCF/AHA/AATS/ACR/ASA/SCA/SCAI/SIR/STS/SVM Guidelines for the Diagnosis and Management of Patients with Thoracic Aortic Disease. Circulation. 2010; 121: Y694-W546   Electronically Signed   By: Markus Daft M.D.   On: 02/01/2014 21:51   Dg Chest Portable 1 View  02/01/2014   CLINICAL DATA:  Severe respiratory distress and low oxygen saturations.  EXAM: PORTABLE CHEST - 1 VIEW  COMPARISON:  01/09/2014  FINDINGS: Single view of the chest demonstrates clear lungs. There is no significant pulmonary edema or airspace disease. Heart size is within normal limits and stable. Again noted are atherosclerotic calcifications at the aortic arch. Patient is mildly rotated towards the left.  IMPRESSION: No acute chest findings.   Electronically Signed   By: Markus Daft M.D.   On: 02/01/2014 17:52         Subjective: Patient is breathing better today. Denies any chest pain, nausea, vomiting, diarrhea, abdominal pain, dysuria, hematuria. No hematochezia or melena. No fevers or chills.  Objective: Filed Vitals:   02/01/14 2330 02/02/14 0100 02/02/14 0332 02/02/14 0534  BP: 111/51   109/37  Pulse: 79     Temp:      TempSrc:      Resp: 18   20  Height:   5' 0.5" (1.537 m)   Weight:   100.1 kg (220 lb 10.9 oz)   SpO2: 100% 98%  100%    Intake/Output Summary (Last 24 hours) at 02/02/14 0841 Last data filed at 02/02/14 0331  Gross per 24 hour  Intake   1050 ml  Output    250 ml  Net    800 ml   Weight  change:  Exam:   General:  Pt is alert, follows commands appropriately, not in acute distress  HEENT: No icterus, No thrush,  [place]/AT  Cardiovascular: RRR, S1/S2, no rubs, no gallops  Respiratory: Scattered bilateral crackles, left greater than right. No wheezing  Abdomen: Soft/+BS, non tender, non distended, no guarding  Extremities: No edema, No lymphangitis, No petechiae, No rashes, no synovitis  Data Reviewed: Basic Metabolic Panel:  Recent Labs Lab 02/01/14 1730 02/02/14 0651  NA 140 141  K 4.1 4.8  CL 99 102  CO2 30 31  GLUCOSE 207* 110*  BUN 24* 21  CREATININE 1.19* 1.02  CALCIUM 8.8 8.0*   Liver Function Tests: No results for input(s): AST, ALT, ALKPHOS, BILITOT, PROT, ALBUMIN in the last 168 hours. No results for input(s): LIPASE, AMYLASE in the last 168 hours. No results for input(s): AMMONIA in the last 168 hours. CBC:  Recent Labs Lab 02/01/14 1730 02/02/14 0651  WBC 15.9* 10.0  NEUTROABS 12.4*  --   HGB 11.9* 10.1*  HCT 38.1 32.7*  MCV 99.5 99.7  PLT 232 197   Cardiac Enzymes:  Recent Labs Lab 02/01/14 1730  TROPONINI <0.03   BNP: Invalid input(s): POCBNP CBG:  Recent Labs Lab 02/01/14 1723 02/02/14 0409 02/02/14 0826  GLUCAP 185* 121* 77  No results found for this or any previous visit (from the past 240 hour(s)).   Scheduled Meds: . albuterol  2.5 mg Nebulization Q6H  . [START ON 02/03/2014] aspirin EC  81 mg Oral Q M,W,F  . cholecalciferol  1,000 Units Oral Daily  . enoxaparin (LOVENOX) injection  30 mg Subcutaneous Q24H  . fluticasone  2 spray Each Nare Daily  . gabapentin  600 mg Oral BID  . insulin aspart  0-9 Units Subcutaneous 6 times per day  . metoprolol succinate  50 mg Oral BID  . pantoprazole  40 mg Oral q morning - 10a  . piperacillin-tazobactam (ZOSYN)  IV  3.375 g Intravenous 3 times per day  . rosuvastatin  5 mg Oral QHS  . vancomycin  1,250 mg Intravenous Q24H   Continuous Infusions: . sodium chloride  50 mL/hr at 02/02/14 0017     Yash Cacciola, DO  [facility] Pager [phone]  If 7PM-7AM, please contact night-coverage [email or web address] Password [email or web address] 02/02/2014, 8:41 AM   LOS: 1 day

## 2014-02-03 LAB — BASIC METABOLIC PANEL
Anion gap: 8 (ref 5–15)
BUN: 19 mg/dL (ref 6–23)
CHLORIDE: 99 meq/L (ref 96–112)
CO2: 31 mmol/L (ref 19–32)
Calcium: 8.2 mg/dL — ABNORMAL LOW (ref 8.4–10.5)
Creatinine, Ser: 1.19 mg/dL — ABNORMAL HIGH (ref 0.50–1.10)
GFR calc Af Amer: 48 mL/min — ABNORMAL LOW (ref >90)
GFR calc non Af Amer: 42 mL/min — ABNORMAL LOW (ref >90)
Glucose, Bld: 146 mg/dL — ABNORMAL HIGH (ref 70–99)
Potassium: 4.4 mmol/L (ref 3.5–5.1)
Sodium: 138 mmol/L (ref 135–145)

## 2014-02-03 LAB — GLUCOSE, CAPILLARY
Glucose-Capillary: 150 mg/dL — ABNORMAL HIGH (ref 70–99)
Glucose-Capillary: 130 mg/dL — ABNORMAL HIGH (ref 70–99)
Glucose-Capillary: 179 mg/dL — ABNORMAL HIGH (ref 70–99)
Glucose-Capillary: 178 mg/dL — ABNORMAL HIGH (ref 70–99)
Glucose-Capillary: 122 mg/dL — ABNORMAL HIGH (ref 70–99)

## 2014-02-03 LAB — CBC
HEMATOCRIT: 30.1 % — AB (ref 36.0–46.0)
Hemoglobin: 9.1 g/dL — ABNORMAL LOW (ref 12.0–15.0)
MCH: 30.3 pg (ref 26.0–34.0)
MCHC: 30.2 g/dL (ref 30.0–36.0)
MCV: 100.3 fL — AB (ref 78.0–100.0)
PLATELETS: 172 10*3/uL (ref 150–400)
RBC: 3 MIL/uL — AB (ref 3.87–5.11)
RDW: 13.7 % (ref 11.5–15.5)
WBC: 7.2 10*3/uL (ref 4.0–10.5)

## 2014-02-03 NOTE — Progress Notes (Signed)
PROGRESS NOTE  [name] [name] ZOX:[id number] DOB: April 09, 1930 DOA: 02/01/2014 PCP: Cari Caraway, MD  Brief history 81 year old female with history of hypertension, diabetes mellitus, mild aortic stenosis, atypical chest pain with normal heart catheterization 01/03/2014 presented with sudden onset of shortness of breath at 2 PM on 02/01/2014. The patient states that she had a long "coughing spell" during which, the patient had some epistaxis. Associated with her coughing spell, the patient became increasingly short of breath. EMS was activated. Oxygen saturation was noted to be 60%, and the patient was placed on a nonrebreather. CT angiogram of the chest in the emergency department was negative for pulmonary embolus but showed scattered patchy and nodular densities in the bilateral lungs which may represent acute infectious or inflammatory process. The patient was admitted and treated for HCAP. Assessment/Plan: HCAP -Continue vancomycin and Zosyn pending culture data -Continue bronchodilators -Pulmonary hygiene Acute respiratory failure -Presently stable on 2L -secondary to HCAP Atypical chest pain -Patient had normal heart catheterization on 01/03/2014 -EKG without any ST-T wave changes -CTA chest negative for pulmonary embolus Atrial Dysrthymia -Patient recently had a two-week event monitor which was negative for atrial fibrillation -Showed the patient had frequent PACs -Continue metoprolol succinate 50 mg twice a day Bacteremia -suspect contaminant -continue vancomycin pending culture data Diabetes mellitus type 2 -Hemoglobin A1c 7.0 -NovoLog sliding scale Hypertension -Continue metoprolol succinate -Hold losartan and HCTZ due to soft BP Hyperlipidemia -continue Crestor   Family Communication: Pt at beside Disposition Plan: Home when medically stable     Procedures/Studies: Dg Chest 2 View (if Patient Has Fever And/or Copd)  01/09/2014   CLINICAL DATA:   Cough and congestion.  Fever.  EXAM: CHEST  2 VIEW  COMPARISON:  05/05/2013  FINDINGS: Multiple leads overlie the patient. Stable cardiac and mediastinal contours. No large consolidative pulmonary opacities. No pleural effusion or pneumothorax. Mid thoracic spine degenerative change.  IMPRESSION: No acute cardiopulmonary process.   Electronically Signed   By: Lovey Newcomer M.D.   On: 01/09/2014 19:18   Ct Angio Chest Pe W/cm &/or Wo Cm  02/01/2014   CLINICAL DATA:  81 year old with sudden onset of respiratory distress and low oxygen saturations.  EXAM: CT ANGIOGRAPHY CHEST WITH CONTRAST  TECHNIQUE: Multidetector CT imaging of the chest was performed using the standard protocol during bolus administration of intravenous contrast. Multiplanar CT image reconstructions and MIPs were obtained to evaluate the vascular anatomy.  CONTRAST:  12mL OMNIPAQUE IOHEXOL 350 MG/ML SOLN  COMPARISON:  Chest radiograph 02/01/2014.  FINDINGS: There is no evidence for a pulmonary embolism. No significant chest lymphadenopathy. No significant pericardial or pleural fluid. Ascending thoracic aorta is mildly enlarged measuring up to 3.7 cm. Proximal great vessels are patent. Normal caliber of the descending thoracic aorta. Proximal abdominal aorta is patent.  There is motion artifact on this examination. There is a 1.4 cm calcified gallstone. Trachea and mainstem bronchi are patent. There is an irregular nodular lesion involving the posterior right middle lobe on sequence 406, image 64. This area roughly measures up to 1.3 cm. The margins of this density are poorly defined and suspect this is an infectious or inflammatory process. Question a 0.3 cm nodule the right upper lobe on sequence 406, image 31. There are perivascular parenchymal densities in the left lower lobe on sequence 406, image 83. Questionable nodular lesion in the left lower lobe on image 69 roughly measuring 0.4 cm. Subtle nodular density in the right lower lobe measures  0.3 cm on image 53. There are additional small nodules in the right lower lobe on images 73 and 74.  No acute bone abnormality.  Review of the MIP images confirms the above findings.  IMPRESSION: Negative for a pulmonary embolism.  Small scattered patchy and nodular densities in both lungs. Findings could represent an acute infectious or inflammatory process. Recommend a 3 month follow up to ensure resolution of the largest area in the right middle lobe.  Cholelithiasis.  Mild enlargement of the ascending thoracic aorta measuring up to 3.7 cm. Recommend annual imaging followup by CTA or MRA. This recommendation follows 2010 ACCF/AHA/AATS/ACR/ASA/SCA/SCAI/SIR/STS/SVM Guidelines for the Diagnosis and Management of Patients with Thoracic Aortic Disease. Circulation. 2010; 121: H299-M426   Electronically Signed   By: Markus Daft M.D.   On: 02/01/2014 21:51   Dg Chest Portable 1 View  02/01/2014   CLINICAL DATA:  Severe respiratory distress and low oxygen saturations.  EXAM: PORTABLE CHEST - 1 VIEW  COMPARISON:  01/09/2014  FINDINGS: Single view of the chest demonstrates clear lungs. There is no significant pulmonary edema or airspace disease. Heart size is within normal limits and stable. Again noted are atherosclerotic calcifications at the aortic arch. Patient is mildly rotated towards the left.  IMPRESSION: No acute chest findings.   Electronically Signed   By: Markus Daft M.D.   On: 02/01/2014 17:52         Subjective:  patient is breathing better. She denies any fevers, chills, chest pain, nausea, vomiting, diarrhea. She still has some dyspnea on exertion.   Objective: Filed Vitals:   02/03/14 0359 02/03/14 0836 02/03/14 1249 02/03/14 1324  BP: 129/54  153/52 153/72  Pulse: 105  105   Temp: 99.8 F (37.7 C)  99.7 F (37.6 C) 98.8 F (37.1 C)  TempSrc: Oral  Oral Oral  Resp: 20  14 20   Height:      Weight:      SpO2: 98% 98% 99% 99%    Intake/Output Summary (Last 24 hours) at 02/03/14  1757 Last data filed at 02/03/14 1659  Gross per 24 hour  Intake 1456.67 ml  Output    600 ml  Net 856.67 ml   Weight change:  Exam:   General:  Pt is alert, follows commands appropriately, not in acute distress  HEENT: No icterus, No thrush, No neck mass, [place]/AT  Cardiovascular: RRR, S1/S2, no rubs, no gallops  Respiratory: bibasilar crackles, right greater than left. No wheezing.   Abdomen: Soft/+BS, non tender, non distended, no guarding  Extremities: No edema, No lymphangitis, No petechiae, No rashes, no synovitis  Data Reviewed: Basic Metabolic Panel:  Recent Labs Lab 02/01/14 1730 02/02/14 0651 02/03/14 0600  NA 140 141 138  K 4.1 4.8 4.4  CL 99 102 99  CO2 30 31 31   GLUCOSE 207* 110* 146*  BUN 24* 21 19  CREATININE 1.19* 1.02 1.19*  CALCIUM 8.8 8.0* 8.2*   Liver Function Tests: No results for input(s): AST, ALT, ALKPHOS, BILITOT, PROT, ALBUMIN in the last 168 hours. No results for input(s): LIPASE, AMYLASE in the last 168 hours. No results for input(s): AMMONIA in the last 168 hours. CBC:  Recent Labs Lab 02/01/14 1730 02/02/14 0651 02/03/14 0600  WBC 15.9* 10.0 7.2  NEUTROABS 12.4*  --   --   HGB 11.9* 10.1* 9.1*  HCT 38.1 32.7* 30.1*  MCV 99.5 99.7 100.3*  PLT 232 197 172   Cardiac Enzymes:  Recent Labs Lab 02/01/14 1730  TROPONINI <0.03   BNP: Invalid input(s): POCBNP CBG:  Recent Labs Lab 02/02/14 2353 02/03/14 0356 02/03/14 0746 02/03/14 1203 02/03/14 1708  GLUCAP 151* 150* 130* 179* 178*    Recent Results (from the past 240 hour(s))  Blood culture (routine x 2)     Status: None (Preliminary result)   Collection Time: 02/01/14 10:20 PM  Result Value Ref Range Status   Specimen Description BLOOD ARM RIGHT  Final   Special Requests BOTTLES DRAWN AEROBIC AND ANAEROBIC 10CC  Final   Culture   Final    GRAM POSITIVE COCCI IN CLUSTERS Note: Culture results may be compromised due to an inadequate volume of blood received in  culture bottles. Gram Stain Report Called to,Read Back By and Verified With: ASIA JETTER 02/03/14 230AM [place] Performed at [facility]    Report Status PENDING  Incomplete  Blood culture (routine x 2)     Status: None (Preliminary result)   Collection Time: 02/01/14 10:32 PM  Result Value Ref Range Status   Specimen Description BLOOD HAND LEFT  Final   Special Requests BOTTLES DRAWN AEROBIC ONLY 5CC  Final   Culture   Final           BLOOD CULTURE RECEIVED NO GROWTH TO DATE CULTURE WILL BE HELD FOR 5 DAYS BEFORE ISSUING A FINAL NEGATIVE REPORT Performed at [facility]    Report Status PENDING  Incomplete     Scheduled Meds: . aspirin EC  81 mg Oral Q M,W,F  . cholecalciferol  1,000 Units Oral Daily  . enoxaparin (LOVENOX) injection  40 mg Subcutaneous Q24H  . fluticasone  2 spray Each Nare Daily  . gabapentin  600 mg Oral BID  . insulin aspart  0-9 Units Subcutaneous 6 times per day  . metoprolol succinate  50 mg Oral BID  . pantoprazole  40 mg Oral q morning - 10a  . piperacillin-tazobactam (ZOSYN)  IV  3.375 g Intravenous 3 times per day  . rosuvastatin  5 mg Oral QHS  . vancomycin  1,250 mg Intravenous Q24H   Continuous Infusions: . sodium chloride 50 mL/hr at 02/02/14 0017     Giordana Weinheimer, DO  [facility] Pager [phone]  If 7PM-7AM, please contact night-coverage [email or web address] Password [facility] 02/03/2014, 5:57 PM   LOS: 2 days

## 2014-02-03 NOTE — Care Management Note (Signed)
    Page 1 of 1   02/06/2014     3:03:55 PM CARE MANAGEMENT NOTE 02/06/2014  Patient:  [name] [name],[name] [name]   Account Number:  [id number]  Date Initiated:  02/03/2014  Documentation initiated by:  Tomi Bamberger  Subjective/Objective Assessment:   dx sob, copd, hcap  admit-lives with spouse.     Action/Plan:   Anticipated DC Date:  02/06/2014   Anticipated DC Plan:  SKILLED NURSING FACILITY  In-house referral  Clinical Social Worker      DC Planning Services  CM consult      Choice offered to / List presented to:             Status of service:  Completed, signed off Medicare Important Message given?  YES (If response is "NO", the following Medicare IM given date fields will be blank) Date Medicare IM given:  02/03/2014 Medicare IM given by:  Tomi Bamberger Date Additional Medicare IM given:  02/06/2014 Additional Medicare IM given by:  Tomi Bamberger  Discharge Disposition:  [place]  Per UR Regulation:  Reviewed for med. necessity/level of care/duration of stay  If discussed at [place] of Stay Meetings, dates discussed:    Comments:  02/06/14 [place], BSN [phone] patient for dc to snf today.  02/03/14 [place], BSN [phone] patient is from home with spouse.  NCM will cont to follow for dc needs.

## 2014-02-04 DIAGNOSIS — R5381 Other malaise: Secondary | ICD-10-CM

## 2014-02-04 LAB — CULTURE, BLOOD (ROUTINE X 2)

## 2014-02-04 LAB — GLUCOSE, CAPILLARY
GLUCOSE-CAPILLARY: 144 mg/dL — AB (ref 70–99)
GLUCOSE-CAPILLARY: 222 mg/dL — AB (ref 70–99)
Glucose-Capillary: 179 mg/dL — ABNORMAL HIGH (ref 70–99)
Glucose-Capillary: 135 mg/dL — ABNORMAL HIGH (ref 70–99)
Glucose-Capillary: 189 mg/dL — ABNORMAL HIGH (ref 70–99)
Glucose-Capillary: 154 mg/dL — ABNORMAL HIGH (ref 70–99)

## 2014-02-04 LAB — BASIC METABOLIC PANEL
Anion gap: 7 (ref 5–15)
BUN: 12 mg/dL (ref 6–23)
CHLORIDE: 99 mmol/L (ref 96–112)
CO2: 33 mmol/L — ABNORMAL HIGH (ref 19–32)
CREATININE: 0.93 mg/dL (ref 0.50–1.10)
Calcium: 8.4 mg/dL (ref 8.4–10.5)
GFR, EST AFRICAN AMERICAN: 65 mL/min — AB (ref >90)
GFR, EST NON AFRICAN AMERICAN: 56 mL/min — AB (ref >90)
GLUCOSE: 183 mg/dL — AB (ref 70–99)
Potassium: 4.2 mmol/L (ref 3.5–5.1)
Sodium: 139 mmol/L (ref 135–145)

## 2014-02-04 MED ORDER — LORAZEPAM 2 MG/ML IJ SOLN
1.0000 mg | Freq: Once | INTRAMUSCULAR | Status: AC
  Administered 2014-02-04: 1 mg via INTRAVENOUS
  Filled 2014-02-04: qty 1

## 2014-02-04 MED ORDER — INSULIN ASPART 100 UNIT/ML ~~LOC~~ SOLN
0.0000 [IU] | Freq: Three times a day (TID) | SUBCUTANEOUS | Status: DC
  Administered 2014-02-04: 3 [IU] via SUBCUTANEOUS
  Administered 2014-02-05: 1 [IU] via SUBCUTANEOUS
  Administered 2014-02-05: 7 [IU] via SUBCUTANEOUS
  Administered 2014-02-05: 3 [IU] via SUBCUTANEOUS
  Administered 2014-02-06 (×2): 2 [IU] via SUBCUTANEOUS
  Administered 2014-02-06: 1 [IU] via SUBCUTANEOUS
  Administered 2014-02-07: 3 [IU] via SUBCUTANEOUS
  Administered 2014-02-07: 2 [IU] via SUBCUTANEOUS

## 2014-02-04 NOTE — Progress Notes (Signed)
Pt maintained her O2 sats in high 90s-100 on 2L [place].  O2 removed to trial sats on room air.  Saturations maintained in mid 90's for several hours.  Called to room by PT with reports of decreased saturations.  O2 sats 86% while lying in bed on room air.  Pt not complaining of SOB and appeared comfortable.  2L [place] applied.  Sats to mid 90's after 2 mins of oxygenation.  PT deferred ambulation on room air at this time.  Will cont to monitor. [name] [name], [facility]

## 2014-02-04 NOTE — Discharge Summary (Signed)
Physician Discharge Summary  [name] [name] CVE:[id number] DOB: [birth year] DOA: 02/01/2014  PCP: Cari Caraway, MD  Admit date: 02/01/2014 Discharge date: 02/05/14 Recommendations for Outpatient Follow-up:  1. Pt will need to follow up with PCP in 2 weeks post discharge 2. Please obtain BMP and CBC in one week Discharge Diagnoses:  HCAP -Continue Zosyn -d/c vancomycin -Continue bronchodilators -Pulmonary hygiene - the patient will be discharged with  Levofloxacin for 4 additional days which will complete 7 days of therapy Acute respiratory failure -Presently stable on 2L -secondary to HCAP Atypical chest pain -Patient had normal heart catheterization on 01/03/2014 -EKG without any ST-T wave changes -CTA chest negative for pulmonary embolus Atrial Dysrthymia -Patient recently had a two-week event monitor which was negative for atrial fibrillation -Showed the patient had frequent PACs -Continue metoprolol succinate 50 mg twice a day Bacteremia-CoNS -contaminant -Discontinue vancomycin Diabetes mellitus type 2 -Hemoglobin A1c 7.0 -NovoLog sliding scale Hypertension -Continue metoprolol succinate -Hold losartan and HCTZ due to soft BP Hyperlipidemia -continue Crestor Deconditioning -PT eval -will need SNF  Discharge Condition: stable  Disposition: SNF  Diet:heart healthy Wt Readings from Last 3 Encounters:  02/02/14 100.1 kg (220 lb 10.9 oz)  01/19/14 100.608 kg (221 lb 12.8 oz)  01/12/14 103.783 kg (228 lb 12.8 oz)    History of present illness:  81 year old female with history of hypertension, diabetes mellitus, mild aortic stenosis, atypical chest pain with normal heart catheterization 01/03/2014 presented with sudden onset of shortness of breath at 2 PM on 02/01/2014. The patient states that she had a long "coughing spell" during which, the patient had some epistaxis. Associated with her coughing spell, the patient became increasingly short of breath. EMS was  activated. Oxygen saturation was noted to be 60%, and the patient was placed on a nonrebreather. CT angiogram of the chest in the emergency department was negative for pulmonary embolus but showed scattered patchy and nodular densities in the bilateral lungs which may represent acute infectious or inflammatory process. The patient was admitted and treated for HCAP. The patient was started on intravenous Zosyn. The patient improved clinically she remained weak. Physical therapy was consulted. It was recommended that the patient go for a short stay at a skilled nursing facility which she agreed.   Discharge Exam: Filed Vitals:   02/04/14 0414  BP: 143/54  Pulse: 81  Temp: 98.4 F (36.9 C)  Resp: 17   Filed Vitals:   02/03/14 2008 02/03/14 2300 02/04/14 0003 02/04/14 0414  BP: 107/45 131/75 145/70 143/54  Pulse: 108 109 110 81  Temp: 98.4 F (36.9 C)  97.6 F (36.4 C) 98.4 F (36.9 C)  TempSrc: Oral  Axillary   Resp: 20  16 17   Height:      Weight:      SpO2: 100% 100% 96% 97%   General: A&O x 3, NAD, pleasant, cooperative Cardiovascular: RRR, no rub, no gallop, no S3 Respiratory:  Bibasilar rales. Good air movement, no wheeze Abdomen:soft, nontender, nondistended, positive bowel sounds Extremities: No edema, No lymphangitis, no petechiae  Discharge Instructions     Medication List    ASK your doctor about these medications        albuterol 108 (90 BASE) MCG/ACT inhaler  Commonly known as:  PROVENTIL HFA;VENTOLIN HFA  Inhale 1 puff into the lungs every 6 (six) hours as needed for wheezing or shortness of breath.     aspirin 81 MG tablet  Take 81 mg by mouth every Monday, Wednesday, and Friday. Take 1 on Monday,  Wednesday, and Friday only     cholecalciferol 1000 UNITS tablet  Commonly known as:  VITAMIN D  Take 1,000 Units by mouth daily.     fluticasone 50 MCG/ACT nasal spray  Commonly known as:  FLONASE  Place 2 sprays into both nostrils daily.     furosemide 20  MG tablet  Commonly known as:  LASIX  Take 20 mg by mouth as needed for fluid (Left foot swelling).     gabapentin 300 MG capsule  Commonly known as:  NEURONTIN  Take 900 mg by mouth 3 (three) times daily.     HYDROcodone-acetaminophen 5-325 MG per tablet  Commonly known as:  NORCO/VICODIN  Take 1-2 tablets by mouth daily as needed for moderate pain.     losartan-hydrochlorothiazide 50-12.5 MG per tablet  Commonly known as:  HYZAAR  Take 1 tablet by mouth daily.     metFORMIN 1000 MG tablet  Commonly known as:  GLUCOPHAGE  Take 1 tablet (1,000 mg total) by mouth 2 (two) times daily with a meal.     metoprolol succinate 50 MG 24 hr tablet  Commonly known as:  TOPROL-XL  Take 1 tablet (50 mg total) by mouth 2 (two) times daily.     nitroGLYCERIN 0.4 MG SL tablet  Commonly known as:  NITROSTAT  Place 1 tablet (0.4 mg total) under the tongue every 5 (five) minutes as needed for chest pain.     pantoprazole 40 MG tablet  Commonly known as:  PROTONIX  Take 40 mg by mouth every morning.     predniSONE 10 MG tablet  Commonly known as:  DELTASONE  3 tablets daily for 2 days then decrease by 1 tablet every 2 days until off     rosuvastatin 5 MG tablet  Commonly known as:  CRESTOR  Take 5 mg by mouth at bedtime.     triamcinolone 0.025 % cream  Commonly known as:  KENALOG  Apply 1 application topically as needed (as needed for leg pain).         The results of significant diagnostics from this hospitalization (including imaging, microbiology, ancillary and laboratory) are listed below for reference.    Significant Diagnostic Studies: Dg Chest 2 View (if Patient Has Fever And/or Copd)  01/09/2014   CLINICAL DATA:  Cough and congestion.  Fever.  EXAM: CHEST  2 VIEW  COMPARISON:  05/05/2013  FINDINGS: Multiple leads overlie the patient. Stable cardiac and mediastinal contours. No large consolidative pulmonary opacities. No pleural effusion or pneumothorax. Mid thoracic spine  degenerative change.  IMPRESSION: No acute cardiopulmonary process.   Electronically Signed   By: Lovey Newcomer M.D.   On: 01/09/2014 19:18   Ct Angio Chest Pe W/cm &/or Wo Cm  02/01/2014   CLINICAL DATA:  81 year old with sudden onset of respiratory distress and low oxygen saturations.  EXAM: CT ANGIOGRAPHY CHEST WITH CONTRAST  TECHNIQUE: Multidetector CT imaging of the chest was performed using the standard protocol during bolus administration of intravenous contrast. Multiplanar CT image reconstructions and MIPs were obtained to evaluate the vascular anatomy.  CONTRAST:  66mL OMNIPAQUE IOHEXOL 350 MG/ML SOLN  COMPARISON:  Chest radiograph 02/01/2014.  FINDINGS: There is no evidence for a pulmonary embolism. No significant chest lymphadenopathy. No significant pericardial or pleural fluid. Ascending thoracic aorta is mildly enlarged measuring up to 3.7 cm. Proximal great vessels are patent. Normal caliber of the descending thoracic aorta. Proximal abdominal aorta is patent.  There is motion artifact on this examination. There  is a 1.4 cm calcified gallstone. Trachea and mainstem bronchi are patent. There is an irregular nodular lesion involving the posterior right middle lobe on sequence 406, image 64. This area roughly measures up to 1.3 cm. The margins of this density are poorly defined and suspect this is an infectious or inflammatory process. Question a 0.3 cm nodule the right upper lobe on sequence 406, image 31. There are perivascular parenchymal densities in the left lower lobe on sequence 406, image 83. Questionable nodular lesion in the left lower lobe on image 69 roughly measuring 0.4 cm. Subtle nodular density in the right lower lobe measures 0.3 cm on image 53. There are additional small nodules in the right lower lobe on images 73 and 74.  No acute bone abnormality.  Review of the MIP images confirms the above findings.  IMPRESSION: Negative for a pulmonary embolism.  Small scattered patchy and  nodular densities in both lungs. Findings could represent an acute infectious or inflammatory process. Recommend a 3 month follow up to ensure resolution of the largest area in the right middle lobe.  Cholelithiasis.  Mild enlargement of the ascending thoracic aorta measuring up to 3.7 cm. Recommend annual imaging followup by CTA or MRA. This recommendation follows 2010 ACCF/AHA/AATS/ACR/ASA/SCA/SCAI/SIR/STS/SVM Guidelines for the Diagnosis and Management of Patients with Thoracic Aortic Disease. Circulation. 2010; 121: J500-X381   Electronically Signed   By: Markus Daft M.D.   On: 02/01/2014 21:51   Dg Chest Portable 1 View  02/01/2014   CLINICAL DATA:  Severe respiratory distress and low oxygen saturations.  EXAM: PORTABLE CHEST - 1 VIEW  COMPARISON:  01/09/2014  FINDINGS: Single view of the chest demonstrates clear lungs. There is no significant pulmonary edema or airspace disease. Heart size is within normal limits and stable. Again noted are atherosclerotic calcifications at the aortic arch. Patient is mildly rotated towards the left.  IMPRESSION: No acute chest findings.   Electronically Signed   By: Markus Daft M.D.   On: 02/01/2014 17:52     Microbiology: Recent Results (from the past 240 hour(s))  Blood culture (routine x 2)     Status: None   Collection Time: 02/01/14 10:20 PM  Result Value Ref Range Status   Specimen Description BLOOD ARM RIGHT  Final   Special Requests BOTTLES DRAWN AEROBIC AND ANAEROBIC 10CC  Final   Culture   Final    STAPHYLOCOCCUS SPECIES (COAGULASE NEGATIVE) Note: THE SIGNIFICANCE OF ISOLATING THIS ORGANISM FROM A SINGLE SET OF BLOOD CULTURES WHEN MULTIPLE SETS ARE DRAWN IS UNCERTAIN. PLEASE NOTIFY THE MICROBIOLOGY DEPARTMENT WITHIN ONE WEEK IF SPECIATION AND SENSITIVITIES ARE REQUIRED. Note: Culture results may be compromised due to an inadequate volume of blood received in culture bottles. Gram Stain Report Called to,Read Back By and Verified With: ASIA JETTER  02/03/14 230AM [place] Performed at [facility]    Report Status 02/04/2014 FINAL  Final  Blood culture (routine x 2)     Status: None (Preliminary result)   Collection Time: 02/01/14 10:32 PM  Result Value Ref Range Status   Specimen Description BLOOD HAND LEFT  Final   Special Requests BOTTLES DRAWN AEROBIC ONLY 5CC  Final   Culture   Final           BLOOD CULTURE RECEIVED NO GROWTH TO DATE CULTURE WILL BE HELD FOR 5 DAYS BEFORE ISSUING A FINAL NEGATIVE REPORT Performed at [facility]    Report Status PENDING  Incomplete     Labs: Basic  Metabolic Panel:  Recent Labs Lab 02/01/14 1730 02/02/14 0651 02/03/14 0600 02/04/14 0447  NA 140 141 138 139  K 4.1 4.8 4.4 4.2  CL 99 102 99 99  CO2 30 31 31  33*  GLUCOSE 207* 110* 146* 183*  BUN 24* 21 19 12   CREATININE 1.19* 1.02 1.19* 0.93  CALCIUM 8.8 8.0* 8.2* 8.4   Liver Function Tests: No results for input(s): AST, ALT, ALKPHOS, BILITOT, PROT, ALBUMIN in the last 168 hours. No results for input(s): LIPASE, AMYLASE in the last 168 hours. No results for input(s): AMMONIA in the last 168 hours. CBC:  Recent Labs Lab 02/01/14 1730 02/02/14 0651 02/03/14 0600  WBC 15.9* 10.0 7.2  NEUTROABS 12.4*  --   --   HGB 11.9* 10.1* 9.1*  HCT 38.1 32.7* 30.1*  MCV 99.5 99.7 100.3*  PLT 232 197 172   Cardiac Enzymes:  Recent Labs Lab 02/01/14 1730  TROPONINI <0.03   BNP: Invalid input(s): POCBNP CBG:  Recent Labs Lab 02/03/14 1957 02/04/14 02/04/14 0411 02/04/14 0801 02/04/14 1248  GLUCAP 122* 144* 179* 135* 189*    Time coordinating discharge:  Greater than 30 minutes  Signed:  Raynard Mapps, DO [facility] Pager: [phone] 02/04/2014, 3:15 PM

## 2014-02-04 NOTE — Progress Notes (Addendum)
PT with new onset of confusion. Called rapid response to bedside. VS are stable. Paged provider on call. Awaiting further orders. Will continue to monitor pt. Pt being uncooperative and not trying to get back in bed.

## 2014-02-04 NOTE — Progress Notes (Signed)
Call received at 2210 per floor RN for patient with acute change in mental status. At [place] patient found in bed on phone speaking with family member, no apparent confusion. At 2200 patient found by RN lethargic sitting in bed, unable to state name, mumbling moving all extremities with normal strength. SP02 checked yielding 97-99 on 2 LNC. Upon my arrival at 2215 patient found resting in bed able to state clearly "I want to get up to pee. " Oriented to time and person "I know who I AM!" disoriented to situation, and location. Assisted to use bedside commode. Patient uncooperative, refusing 02 at one point, refusing to get back to bed and increasingly alert. After much discussion concerning patients' safety, and several attempts by patient to get OOB and walk out of room, patient assisted back to bed and repositioned. Jonette Eva [facility] paged and updated on altered mental status, ativan IVP ordered and given. Pt left resting in bed stating "I want you to shut up and get out of here!" Bed alarm on. Floor Rn to monitor closely and notify myself of worsening changes

## 2014-02-04 NOTE — Evaluation (Signed)
Physical Therapy Evaluation Patient Details Name: [name] [name] MRN: [id number] DOB: 1930-04-26 Today's Date: 02/04/2014   History of Present Illness  81 year old female with history of hypertension, diabetes mellitus, mild aortic stenosis, atypical chest pain with normal heart catheterization 01/03/2014 presented with sudden onset of shortness of breath at 2 PM on 02/01/2014. The patient states that she had a long "coughing spell" during which, the patient had some epistaxis. Associated with her coughing spell, the patient became increasingly short of breath. EMS was activated. Oxygen saturation was noted to be 60%, and the patient was placed on a nonrebreather. CT angiogram of the chest in the emergency department was negative for pulmonary embolus but showed scattered patchy and nodular densities in the bilateral lungs which may represent acute infectious or inflammatory process.   Clinical Impression  Patient inquiring about inpatient rehab stay-do not feel she is a viable candidate to tolerate extended therapy. Patient with decr oxygen saturation with basic conversation in supine on RA. Patient's mobility greatly limited by SOB and deconditioning.  Her mobility status is greatly declined compared to last hospitalization.  Patient may benefit from skilled PT to improve activity tolerance, functional mobility and generalized strength prior to d/c home.    Follow Up Recommendations Home health PT;SNF;Supervision/Assistance - 24 hour    Equipment Recommendations  None recommended by PT    Recommendations for Other Services       Precautions / Restrictions Precautions Precautions: Fall Precaution Comments: oxygen desaturation, high HR at rest Restrictions Weight Bearing Restrictions: No      Mobility  Bed Mobility Overal bed mobility: Needs Assistance Bed Mobility: Supine to Sit     Supine to sit: Mod assist;HOB elevated     General bed mobility comments: patient leans left while  in bed, needs assist to reposition  Transfers Overall transfer level: Needs assistance Equipment used: Rolling walker (2 wheeled) Transfers: Sit to/from Stand Sit to Stand: Mod assist (from toilet with use of grab bars)         General transfer comment: min assistance sit to stand from bed with cues for hand placement  Ambulation/Gait Ambulation/Gait assistance: Min assist Ambulation Distance (Feet): 10 Feet Assistive device: Rolling walker (2 wheeled) Gait Pattern/deviations: Shuffle;Trunk flexed Gait velocity: decreased   General Gait Details: oxygen saturation while laying in bed on RA 77%, HR 115 upon entering room and with basic conversation.  Oxygen saturation on 2L via [place] 97% HR 110 bpm  Stairs            Wheelchair Mobility    Modified Rankin (Stroke Patients Only)       Balance Overall balance assessment: Needs assistance Sitting-balance support: No upper extremity supported Sitting balance-Leahy Scale: Good     Standing balance support: Single extremity supported Standing balance-Leahy Scale: Fair Standing balance comment: able to perform perianal hygeine with RW support                             Pertinent Vitals/Pain Pain Assessment: No/denies pain    Home Living Family/patient expects to be discharged to:: Private residence Living Arrangements: Spouse/significant other Available Help at Discharge: Family (patient's husband requires care as he has Alzheimer's.) Type of Home: House Home Access: Stairs to enter Entrance Stairs-Rails: Right Entrance Stairs-Number of Steps: 2 Home Layout: Two level;Able to live on main level with bedroom/bathroom Home Equipment: Kasandra Knudsen - single point;Walker - 2 wheels;Shower seat Additional Comments: Patient's dau currently assisting with spouse  Prior Function Level of Independence: Independent with assistive device(s)         Comments: uses cane for ambulation.     Hand Dominance    Dominant Hand: Right    Extremity/Trunk Assessment   Upper Extremity Assessment: Generalized weakness           Lower Extremity Assessment: Generalized weakness      Cervical / Trunk Assessment: Kyphotic  Communication   Communication: No difficulties  Cognition Arousal/Alertness: Awake/alert Behavior During Therapy: WFL for tasks assessed/performed Overall Cognitive Status: Within Functional Limits for tasks assessed                      General Comments      Exercises        Assessment/Plan    PT Assessment Patient needs continued PT services  PT Diagnosis Difficulty walking;Abnormality of gait;Other (comment) (deconditioning)   PT Problem List Decreased strength;Decreased activity tolerance;Decreased balance;Decreased mobility;Decreased knowledge of use of DME;Cardiopulmonary status limiting activity  PT Treatment Interventions DME instruction;Gait training;Stair training;Functional mobility training;Therapeutic activities;Therapeutic exercise;Balance training;Patient/family education   PT Goals (Current goals can be found in the Care Plan section) Acute Rehab PT Goals Patient Stated Goal: to go to rehab unit PT Goal Formulation: With patient Time For Goal Achievement: 02/11/14 Potential to Achieve Goals: Good    Frequency Min 2X/week   Barriers to discharge Decreased caregiver support husband with memory issues; unknown if dau can assist    Co-evaluation               End of Session Equipment Utilized During Treatment: Gait belt Activity Tolerance: Patient limited by fatigue (needs cues for energy conservation during activity for O2) Patient left: in bed;with call bell/phone within reach Nurse Communication: Mobility status         Time: 6629-4765 PT Time Calculation (min) (ACUTE ONLY): 27 min   Charges:   PT Evaluation $Initial PT Evaluation Tier I: 1 Procedure PT Treatments $Therapeutic Activity: 8-22 mins   PT G CodesMalka So, [place] [phone] [place] 02/04/2014, 5:57 PM

## 2014-02-04 NOTE — Plan of Care (Signed)
Problem: Acute Rehab PT Goals(only PT should resolve) Goal: Pt Will Ambulate Supplemental oxygen     

## 2014-02-04 NOTE — Progress Notes (Signed)
PROGRESS NOTE  [name] [name] TFT:[id number] DOB: 1930/10/23 DOA: 02/01/2014 PCP: Cari Caraway, MD  Brief history 81 year old female with history of hypertension, diabetes mellitus, mild aortic stenosis, atypical chest pain with normal heart catheterization 01/03/2014 presented with sudden onset of shortness of breath at 2 PM on 02/01/2014. The patient states that she had a long "coughing spell" during which, the patient had some epistaxis. Associated with her coughing spell, the patient became increasingly short of breath. EMS was activated. Oxygen saturation was noted to be 60%, and the patient was placed on a nonrebreather. CT angiogram of the chest in the emergency department was negative for pulmonary embolus but showed scattered patchy and nodular densities in the bilateral lungs which may represent acute infectious or inflammatory process. The patient was admitted and treated for HCAP. Assessment/Plan: HCAP -Continue Zosyn -d/c vancomycin -Continue bronchodilators -Pulmonary hygiene Acute respiratory failure -Presently stable on 2L -secondary to HCAP Atypical chest pain -Patient had normal heart catheterization on 01/03/2014 -EKG without any ST-T wave changes -CTA chest negative for pulmonary embolus Atrial Dysrthymia -Patient recently had a two-week event monitor which was negative for atrial fibrillation -Showed the patient had frequent PACs -Continue metoprolol succinate 50 mg twice a day Bacteremia-CoNS -contaminant -Discontinue vancomycin Diabetes mellitus type 2 -Hemoglobin A1c 7.0 -NovoLog sliding scale Hypertension -Continue metoprolol succinate -Hold losartan and HCTZ due to soft BP Hyperlipidemia -continue Crestor Deconditioning -PT eval -will need SNF  Family Communication--updated daughter on phone--total time 35 min, >50% spent counseling and coordinating care Disposition--SNF when bed is availble          Procedures/Studies: Dg Chest 2  View (if Patient Has Fever And/or Copd)  01/09/2014   CLINICAL DATA:  Cough and congestion.  Fever.  EXAM: CHEST  2 VIEW  COMPARISON:  05/05/2013  FINDINGS: Multiple leads overlie the patient. Stable cardiac and mediastinal contours. No large consolidative pulmonary opacities. No pleural effusion or pneumothorax. Mid thoracic spine degenerative change.  IMPRESSION: No acute cardiopulmonary process.   Electronically Signed   By: Lovey Newcomer M.D.   On: 01/09/2014 19:18   Ct Angio Chest Pe W/cm &/or Wo Cm  02/01/2014   CLINICAL DATA:  81 year old with sudden onset of respiratory distress and low oxygen saturations.  EXAM: CT ANGIOGRAPHY CHEST WITH CONTRAST  TECHNIQUE: Multidetector CT imaging of the chest was performed using the standard protocol during bolus administration of intravenous contrast. Multiplanar CT image reconstructions and MIPs were obtained to evaluate the vascular anatomy.  CONTRAST:  51mL OMNIPAQUE IOHEXOL 350 MG/ML SOLN  COMPARISON:  Chest radiograph 02/01/2014.  FINDINGS: There is no evidence for a pulmonary embolism. No significant chest lymphadenopathy. No significant pericardial or pleural fluid. Ascending thoracic aorta is mildly enlarged measuring up to 3.7 cm. Proximal great vessels are patent. Normal caliber of the descending thoracic aorta. Proximal abdominal aorta is patent.  There is motion artifact on this examination. There is a 1.4 cm calcified gallstone. Trachea and mainstem bronchi are patent. There is an irregular nodular lesion involving the posterior right middle lobe on sequence 406, image 64. This area roughly measures up to 1.3 cm. The margins of this density are poorly defined and suspect this is an infectious or inflammatory process. Question a 0.3 cm nodule the right upper lobe on sequence 406, image 31. There are perivascular parenchymal densities in the left lower lobe on sequence 406, image 83. Questionable nodular lesion in the left lower lobe on image 69 roughly  measuring  0.4 cm. Subtle nodular density in the right lower lobe measures 0.3 cm on image 53. There are additional small nodules in the right lower lobe on images 73 and 74.  No acute bone abnormality.  Review of the MIP images confirms the above findings.  IMPRESSION: Negative for a pulmonary embolism.  Small scattered patchy and nodular densities in both lungs. Findings could represent an acute infectious or inflammatory process. Recommend a 3 month follow up to ensure resolution of the largest area in the right middle lobe.  Cholelithiasis.  Mild enlargement of the ascending thoracic aorta measuring up to 3.7 cm. Recommend annual imaging followup by CTA or MRA. This recommendation follows 2010 ACCF/AHA/AATS/ACR/ASA/SCA/SCAI/SIR/STS/SVM Guidelines for the Diagnosis and Management of Patients with Thoracic Aortic Disease. Circulation. 2010; 121: T419-Q222   Electronically Signed   By: Markus Daft M.D.   On: 02/01/2014 21:51   Dg Chest Portable 1 View  02/01/2014   CLINICAL DATA:  Severe respiratory distress and low oxygen saturations.  EXAM: PORTABLE CHEST - 1 VIEW  COMPARISON:  01/09/2014  FINDINGS: Single view of the chest demonstrates clear lungs. There is no significant pulmonary edema or airspace disease. Heart size is within normal limits and stable. Again noted are atherosclerotic calcifications at the aortic arch. Patient is mildly rotated towards the left.  IMPRESSION: No acute chest findings.   Electronically Signed   By: Markus Daft M.D.   On: 02/01/2014 17:52         Subjective: Patient is breathing better. But she is still feeling quite weak and having difficulty getting out of bed. Denies any fevers, chills, chest pain, nausea, vomiting, diarrhea, abdominal pain.  Objective: Filed Vitals:   02/03/14 2008 02/03/14 2300 02/04/14 0003 02/04/14 0414  BP: 107/45 131/75 145/70 143/54  Pulse: 108 109 110 81  Temp: 98.4 F (36.9 C)  97.6 F (36.4 C) 98.4 F (36.9 C)  TempSrc: Oral   Axillary   Resp: 20  16 17   Height:      Weight:      SpO2: 100% 100% 96% 97%    Intake/Output Summary (Last 24 hours) at 02/04/14 1109 Last data filed at 02/04/14 0800  Gross per 24 hour  Intake   2660 ml  Output    400 ml  Net   2260 ml   Weight change:  Exam:   General:  Pt is alert, follows commands appropriately, not in acute distress  HEENT: No icterus, No thrush,  [place]/AT  Cardiovascular: RRR, S1/S2, no rubs, no gallops  Respiratory: Bibasilar crackles, right greater than left. No wheeze  Abdomen: Soft/+BS, non tender, non distended, no guarding  Extremities: No edema, No lymphangitis, No petechiae, No rashes, no synovitis  Data Reviewed: Basic Metabolic Panel:  Recent Labs Lab 02/01/14 1730 02/02/14 0651 02/03/14 0600 02/04/14 0447  NA 140 141 138 139  K 4.1 4.8 4.4 4.2  CL 99 102 99 99  CO2 30 31 31  33*  GLUCOSE 207* 110* 146* 183*  BUN 24* 21 19 12   CREATININE 1.19* 1.02 1.19* 0.93  CALCIUM 8.8 8.0* 8.2* 8.4   Liver Function Tests: No results for input(s): AST, ALT, ALKPHOS, BILITOT, PROT, ALBUMIN in the last 168 hours. No results for input(s): LIPASE, AMYLASE in the last 168 hours. No results for input(s): AMMONIA in the last 168 hours. CBC:  Recent Labs Lab 02/01/14 1730 02/02/14 0651 02/03/14 0600  WBC 15.9* 10.0 7.2  NEUTROABS 12.4*  --   --   HGB 11.9*  10.1* 9.1*  HCT 38.1 32.7* 30.1*  MCV 99.5 99.7 100.3*  PLT 232 197 172   Cardiac Enzymes:  Recent Labs Lab 02/01/14 1730  TROPONINI <0.03   BNP: Invalid input(s): POCBNP CBG:  Recent Labs Lab 02/03/14 1708 02/03/14 1957 02/04/14 02/04/14 0411 02/04/14 0801  GLUCAP 178* 122* 144* 179* 135*    Recent Results (from the past 240 hour(s))  Blood culture (routine x 2)     Status: None   Collection Time: 02/01/14 10:20 PM  Result Value Ref Range Status   Specimen Description BLOOD ARM RIGHT  Final   Special Requests BOTTLES DRAWN AEROBIC AND ANAEROBIC 10CC  Final    Culture   Final    STAPHYLOCOCCUS SPECIES (COAGULASE NEGATIVE) Note: THE SIGNIFICANCE OF ISOLATING THIS ORGANISM FROM A SINGLE SET OF BLOOD CULTURES WHEN MULTIPLE SETS ARE DRAWN IS UNCERTAIN. PLEASE NOTIFY THE MICROBIOLOGY DEPARTMENT WITHIN ONE WEEK IF SPECIATION AND SENSITIVITIES ARE REQUIRED. Note: Culture results may be compromised due to an inadequate volume of blood received in culture bottles. Gram Stain Report Called to,Read Back By and Verified With: ASIA JETTER 02/03/14 230AM [place] Performed at [facility]    Report Status 02/04/2014 FINAL  Final  Blood culture (routine x 2)     Status: None (Preliminary result)   Collection Time: 02/01/14 10:32 PM  Result Value Ref Range Status   Specimen Description BLOOD HAND LEFT  Final   Special Requests BOTTLES DRAWN AEROBIC ONLY 5CC  Final   Culture   Final           BLOOD CULTURE RECEIVED NO GROWTH TO DATE CULTURE WILL BE HELD FOR 5 DAYS BEFORE ISSUING A FINAL NEGATIVE REPORT Performed at [facility]    Report Status PENDING  Incomplete     Scheduled Meds: . aspirin EC  81 mg Oral Q M,W,F  . cholecalciferol  1,000 Units Oral Daily  . enoxaparin (LOVENOX) injection  40 mg Subcutaneous Q24H  . fluticasone  2 spray Each Nare Daily  . gabapentin  600 mg Oral BID  . insulin aspart  0-9 Units Subcutaneous 6 times per day  . metoprolol succinate  50 mg Oral BID  . pantoprazole  40 mg Oral q morning - 10a  . piperacillin-tazobactam (ZOSYN)  IV  3.375 g Intravenous 3 times per day  . rosuvastatin  5 mg Oral QHS   Continuous Infusions: . sodium chloride 50 mL/hr at 02/04/14 0124     Najai Waszak, DO  [facility] Pager [phone]  If 7PM-7AM, please contact night-coverage [email or web address] Password [email or web address] 02/04/2014, 11:09 AM   LOS: 3 days

## 2014-02-05 LAB — GLUCOSE, CAPILLARY
GLUCOSE-CAPILLARY: 323 mg/dL — AB (ref 70–99)
GLUCOSE-CAPILLARY: 208 mg/dL — AB (ref 70–99)
Glucose-Capillary: 142 mg/dL — ABNORMAL HIGH (ref 70–99)
Glucose-Capillary: 147 mg/dL — ABNORMAL HIGH (ref 70–99)

## 2014-02-05 MED ORDER — HYDROCODONE-ACETAMINOPHEN 5-325 MG PO TABS: 1.0000 | ORAL_TABLET | Freq: Every day | ORAL | Status: DC | PRN

## 2014-02-05 MED ORDER — LOSARTAN POTASSIUM 25 MG PO TABS
25.0000 mg | ORAL_TABLET | Freq: Every day | ORAL | Status: DC
Start: 2014-02-05 — End: 2014-03-31

## 2014-02-05 MED ORDER — GABAPENTIN 300 MG PO CAPS: 600.0000 mg | ORAL_CAPSULE | Freq: Two times a day (BID) | ORAL | Status: DC

## 2014-02-05 MED ORDER — LEVOFLOXACIN 500 MG PO TABS: 500.0000 mg | ORAL_TABLET | Freq: Every day | ORAL | Status: DC

## 2014-02-05 NOTE — Social Work (Signed)
Clinical Social Work Department BRIEF PSYCHOSOCIAL ASSESSMENT 02/05/2014  Patient:  Mcknight,Jaquelyn     Account Number:  [id number]     Admit date:  02/01/2014  Clinical Social Worker:  Lenord Fellers  Date/Time:  02/05/2014 04:18 PM  Referred by:  Physician  Date Referred:  02/04/2014 Referred for  SNF Placement   Other Referral:   Interview type:  Family Other interview type:    PSYCHOSOCIAL DATA Living Status:  FAMILY Admitted from facility:   Level of care:   Primary support name:  Willette Cluster Primary support relationship to patient:  CHILD, ADULT Degree of support available:   Patient lives with husband who has early stages of Alzeimers and dementia. Patient is primary caretaker for her husband and has children who live away from home, who provide some support.    CURRENT CONCERNS Current Concerns  Post-Acute Placement   Other Concerns:    SOCIAL WORK ASSESSMENT / PLAN Patient willing to go to Skilled Nursing facility for about 1 week in order to get strong enough to stay in the home. Daughter will be home for the week in order to provide care for patient's husband while patient is in SNF.   Assessment/plan status:  Psychosocial Support/Ongoing Assessment of Needs Other assessment/ plan:   Information/referral to community resources:    PATIENT'S/FAMILY'S RESPONSE TO PLAN OF CARE: Family identified preference for [facility] as placement. CSW sent out bed requests for [facility]. CSW will continue to follow.  Christene Lye MSW, [place]

## 2014-02-05 NOTE — Discharge Summary (Addendum)
Physician Discharge Summary  Callen Zuba UTM:[id number] DOB: Feb 03, 1930 DOA: 02/01/2014  PCP: Cari Caraway, MD  Admit date: 02/01/2014 Discharge date: 02/06/14 Recommendations for Outpatient Follow-up:  1. Pt will need to follow up with PCP in 2 weeks post discharge 2. Please obtain BMP to evaluate electrolytes and kidney function 3. Please also check CBC to evaluate Hg and Hct levels 4. Please wean oxygen to off to keep oxygen saturation>92%--presently on 2 L Discharge Diagnoses:  HCAP -Continue Zosyn -d/c vancomycin -Continue bronchodilators -Pulmonary hygiene - the patient will be discharged with Levofloxacin for 4 additional days which will complete 7 days of therapy Acute respiratory failure -Presently stable on 2L -secondary to HCAP Atypical chest pain -Patient had normal heart catheterization on 01/03/2014 -EKG without any ST-T wave changes -CTA chest negative for pulmonary embolus Atrial Dysrthymia -Patient recently had a two-week event monitor which was negative for atrial fibrillation -Showed the patient had frequent PACs -Continue metoprolol succinate 50 mg twice a day Bacteremia-CoNS -contaminant -Discontinue vancomycin Diabetes mellitus type 2 -02/02/14--Hemoglobin A1c 7.5 -NovoLog sliding scale -The patient's gabapentin dose was decreased and adjusted for her renal function--she will go home with gabapentin 600 mg twice a day -As the patient's renal function has improved, the patient will be discharged to start back on metformin -Serum creatinine 0.91 on the day of discharge -The patient's renal function should be monitored regularly in the outpatient setting Hypertension -Continue metoprolol succinate -Hold losartan and HCTZ due to soft BP--Blood pressure has remained stable off of these agents -due to pt's aortic stenosis and diabetes mellitus, restart lower dose losartan 25 mg daily Hyperlipidemia -continue Crestor Deconditioning -PT eval -will need  SNF  Discharge Condition: stable  Disposition: SNF  Diet:carb modified Wt Readings from Last 3 Encounters:  02/02/14 100.1 kg (220 lb 10.9 oz)  01/19/14 100.608 kg (221 lb 12.8 oz)  01/12/14 103.783 kg (228 lb 12.8 oz)    History of present illness:  81 year old female with history of hypertension, diabetes mellitus, mild aortic stenosis, atypical chest pain with normal heart catheterization 01/03/2014 presented with sudden onset of shortness of breath at 2 PM on 02/01/2014. The patient states that she had a long "coughing spell" during which, the patient had some epistaxis. Associated with her coughing spell, the patient became increasingly short of breath. EMS was activated. Oxygen saturation was noted to be 60%, and the patient was placed on a nonrebreather. CT angiogram of the chest in the emergency department was negative for pulmonary embolus but showed scattered patchy and nodular densities in the bilateral lungs which may represent acute infectious or inflammatory process. The patient was admitted and treated for HCAP. The patient was started on intravenous Zosyn. The patient improved clinically she remained weak. Physical therapy was consulted. It was recommended that the patient go for a short stay at a skilled nursing facility which she agreed. During the evening of 02/04/2014, the patient had some lethargy and confusion. After the patient was awakened, the patient was agitated and confused. Her vital signs were stable. The following morning, the patient returned to her baseline mental status. Suspect the patient had a component of sundowning. The patient did not have any further episodes of mental status change.  Discharge Exam: Filed Vitals:   02/06/14 0420  BP: 154/86  Pulse: 106  Temp: 98 F (36.7 C)  Resp: 21   Filed Vitals:   02/05/14 1641 02/05/14 2042 02/05/14 2315 02/06/14 0420  BP: 123/48 156/76 124/71 154/86  Pulse: 51 79 87 106  Temp: 97.9 F (36.6 C) 98 F (36.7  C) 98.6 F (37 C) 98 F (36.7 C)  TempSrc: Oral Oral Oral Oral  Resp: 16 20 20 21   Height:      Weight:      SpO2: 100% 99% 100% 100%   General: A&O x 3, NAD, pleasant, cooperative Cardiovascular: RRR, no rub, no gallop, no S3 Respiratory: Bibasilar crackles, left greater than right. No wheeze Abdomen:soft, nontender, nondistended, positive bowel sounds Extremities: trace LE edema, No lymphangitis, no petechiae  Discharge Instructions      Discharge Instructions    Diet - low sodium heart healthy    Complete by:  As directed      Increase activity slowly    Complete by:  As directed             Medication List    STOP taking these medications        losartan-hydrochlorothiazide 50-12.5 MG per tablet  Commonly known as:  HYZAAR     predniSONE 10 MG tablet  Commonly known as:  DELTASONE      TAKE these medications        albuterol 108 (90 BASE) MCG/ACT inhaler  Commonly known as:  PROVENTIL HFA;VENTOLIN HFA  Inhale 1 puff into the lungs every 6 (six) hours as needed for wheezing or shortness of breath.     aspirin 81 MG tablet  Take 81 mg by mouth every Monday, Wednesday, and Friday. Take 1 on Monday, Wednesday, and Friday only     cholecalciferol 1000 UNITS tablet  Commonly known as:  VITAMIN D  Take 1,000 Units by mouth daily.     fluticasone 50 MCG/ACT nasal spray  Commonly known as:  FLONASE  Place 2 sprays into both nostrils daily.     furosemide 20 MG tablet  Commonly known as:  LASIX  Take 20 mg by mouth as needed for fluid (Left foot swelling).     gabapentin 300 MG capsule  Commonly known as:  NEURONTIN  Take 2 capsules (600 mg total) by mouth 2 (two) times daily.     HYDROcodone-acetaminophen 5-325 MG per tablet  Commonly known as:  NORCO/VICODIN  Take 1-2 tablets by mouth daily as needed for moderate pain.     levofloxacin 500 MG tablet  Commonly known as:  LEVAQUIN  Take 1 tablet (500 mg total) by mouth daily.     losartan 25 MG  tablet  Commonly known as:  COZAAR  Take 1 tablet (25 mg total) by mouth daily.     metFORMIN 1000 MG tablet  Commonly known as:  GLUCOPHAGE  Take 1 tablet (1,000 mg total) by mouth 2 (two) times daily with a meal.     metoprolol succinate 50 MG 24 hr tablet  Commonly known as:  TOPROL-XL  Take 1 tablet (50 mg total) by mouth 2 (two) times daily.     nitroGLYCERIN 0.4 MG SL tablet  Commonly known as:  NITROSTAT  Place 1 tablet (0.4 mg total) under the tongue every 5 (five) minutes as needed for chest pain.     pantoprazole 40 MG tablet  Commonly known as:  PROTONIX  Take 40 mg by mouth every morning.     rosuvastatin 5 MG tablet  Commonly known as:  CRESTOR  Take 5 mg by mouth at bedtime.     triamcinolone 0.025 % cream  Commonly known as:  KENALOG  Apply 1 application topically as needed (as needed for leg pain).  The results of significant diagnostics from this hospitalization (including imaging, microbiology, ancillary and laboratory) are listed below for reference.    Significant Diagnostic Studies: Dg Chest 2 View (if Patient Has Fever And/or Copd)  01/09/2014   CLINICAL DATA:  Cough and congestion.  Fever.  EXAM: CHEST  2 VIEW  COMPARISON:  05/05/2013  FINDINGS: Multiple leads overlie the patient. Stable cardiac and mediastinal contours. No large consolidative pulmonary opacities. No pleural effusion or pneumothorax. Mid thoracic spine degenerative change.  IMPRESSION: No acute cardiopulmonary process.   Electronically Signed   By: Lovey Newcomer M.D.   On: 01/09/2014 19:18   Ct Angio Chest Pe W/cm &/or Wo Cm  02/01/2014   CLINICAL DATA:  81 year old with sudden onset of respiratory distress and low oxygen saturations.  EXAM: CT ANGIOGRAPHY CHEST WITH CONTRAST  TECHNIQUE: Multidetector CT imaging of the chest was performed using the standard protocol during bolus administration of intravenous contrast. Multiplanar CT image reconstructions and MIPs were obtained to  evaluate the vascular anatomy.  CONTRAST:  4mL OMNIPAQUE IOHEXOL 350 MG/ML SOLN  COMPARISON:  Chest radiograph 02/01/2014.  FINDINGS: There is no evidence for a pulmonary embolism. No significant chest lymphadenopathy. No significant pericardial or pleural fluid. Ascending thoracic aorta is mildly enlarged measuring up to 3.7 cm. Proximal great vessels are patent. Normal caliber of the descending thoracic aorta. Proximal abdominal aorta is patent.  There is motion artifact on this examination. There is a 1.4 cm calcified gallstone. Trachea and mainstem bronchi are patent. There is an irregular nodular lesion involving the posterior right middle lobe on sequence 406, image 64. This area roughly measures up to 1.3 cm. The margins of this density are poorly defined and suspect this is an infectious or inflammatory process. Question a 0.3 cm nodule the right upper lobe on sequence 406, image 31. There are perivascular parenchymal densities in the left lower lobe on sequence 406, image 83. Questionable nodular lesion in the left lower lobe on image 69 roughly measuring 0.4 cm. Subtle nodular density in the right lower lobe measures 0.3 cm on image 53. There are additional small nodules in the right lower lobe on images 73 and 74.  No acute bone abnormality.  Review of the MIP images confirms the above findings.  IMPRESSION: Negative for a pulmonary embolism.  Small scattered patchy and nodular densities in both lungs. Findings could represent an acute infectious or inflammatory process. Recommend a 3 month follow up to ensure resolution of the largest area in the right middle lobe.  Cholelithiasis.  Mild enlargement of the ascending thoracic aorta measuring up to 3.7 cm. Recommend annual imaging followup by CTA or MRA. This recommendation follows 2010 ACCF/AHA/AATS/ACR/ASA/SCA/SCAI/SIR/STS/SVM Guidelines for the Diagnosis and Management of Patients with Thoracic Aortic Disease. Circulation. 2010; 121: G956-O130    Electronically Signed   By: Markus Daft M.D.   On: 02/01/2014 21:51   Dg Chest Portable 1 View  02/01/2014   CLINICAL DATA:  Severe respiratory distress and low oxygen saturations.  EXAM: PORTABLE CHEST - 1 VIEW  COMPARISON:  01/09/2014  FINDINGS: Single view of the chest demonstrates clear lungs. There is no significant pulmonary edema or airspace disease. Heart size is within normal limits and stable. Again noted are atherosclerotic calcifications at the aortic arch. Patient is mildly rotated towards the left.  IMPRESSION: No acute chest findings.   Electronically Signed   By: Markus Daft M.D.   On: 02/01/2014 17:52     Microbiology: Recent Results (from the  past 240 hour(s))  Blood culture (routine x 2)     Status: None   Collection Time: 02/01/14 10:20 PM  Result Value Ref Range Status   Specimen Description BLOOD ARM RIGHT  Final   Special Requests BOTTLES DRAWN AEROBIC AND ANAEROBIC 10CC  Final   Culture   Final    STAPHYLOCOCCUS SPECIES (COAGULASE NEGATIVE) Note: THE SIGNIFICANCE OF ISOLATING THIS ORGANISM FROM A SINGLE SET OF BLOOD CULTURES WHEN MULTIPLE SETS ARE DRAWN IS UNCERTAIN. PLEASE NOTIFY THE MICROBIOLOGY DEPARTMENT WITHIN ONE WEEK IF SPECIATION AND SENSITIVITIES ARE REQUIRED. Note: Culture results may be compromised due to an inadequate volume of blood received in culture bottles. Gram Stain Report Called to,Read Back By and Verified With: ASIA JETTER 02/03/14 230AM [place] Performed at [facility]    Report Status 02/04/2014 FINAL  Final  Blood culture (routine x 2)     Status: None (Preliminary result)   Collection Time: 02/01/14 10:32 PM  Result Value Ref Range Status   Specimen Description BLOOD HAND LEFT  Final   Special Requests BOTTLES DRAWN AEROBIC ONLY 5CC  Final   Culture   Final           BLOOD CULTURE RECEIVED NO GROWTH TO DATE CULTURE WILL BE HELD FOR 5 DAYS BEFORE ISSUING A FINAL NEGATIVE REPORT Performed at [facility]    Report Status  PENDING  Incomplete     Labs: Basic Metabolic Panel:  Recent Labs Lab 02/01/14 1730 02/02/14 0651 02/03/14 0600 02/04/14 0447 02/06/14 0604  NA 140 141 138 139 141  K 4.1 4.8 4.4 4.2 4.1  CL 99 102 99 99 98  CO2 30 31 31  33* 35*  GLUCOSE 207* 110* 146* 183* 171*  BUN 24* 21 19 12 9   CREATININE 1.19* 1.02 1.19* 0.93 0.91  CALCIUM 8.8 8.0* 8.2* 8.4 8.6   Liver Function Tests: No results for input(s): AST, ALT, ALKPHOS, BILITOT, PROT, ALBUMIN in the last 168 hours. No results for input(s): LIPASE, AMYLASE in the last 168 hours. No results for input(s): AMMONIA in the last 168 hours. CBC:  Recent Labs Lab 02/01/14 1730 02/02/14 0651 02/03/14 0600 02/06/14 0604  WBC 15.9* 10.0 7.2 8.4  NEUTROABS 12.4*  --   --   --   HGB 11.9* 10.1* 9.1* 9.5*  HCT 38.1 32.7* 30.1* 31.8*  MCV 99.5 99.7 100.3* 101.6*  PLT 232 197 172 142*   Cardiac Enzymes:  Recent Labs Lab 02/01/14 1730  TROPONINI <0.03   BNP: Invalid input(s): POCBNP CBG:  Recent Labs Lab 02/05/14 0833 02/05/14 1204 02/05/14 1638 02/05/14 2122 02/06/14 0804  GLUCAP 142* 323* 208* 147* 157*    Time coordinating discharge:  Greater than 30 minutes  Signed:  Christie Copley, DO [facility] Pager: [phone] 02/06/2014, 10:31 AM

## 2014-02-05 NOTE — Social Work (Addendum)
Clinical Social Work Department CLINICAL SOCIAL WORK PLACEMENT NOTE 02/05/2014  Patient:  [name] [name],[name] [name]  Account Number:  [id number] [place] date:  02/01/2014  Clinical Social Worker:  Marja Kays, LCSW  Date/time:  02/05/2014 04:29 PM  Clinical Social Work is seeking post-discharge placement for this patient at the following level of care:   SKILLED NURSING   (*CSW will update this form in Epic as items are completed)     Patient/family provided with [facility] Department of Clinical Social Work's list of facilities offering this level of care within the geographic area requested by the patient (or if unable, by the patient's family).  02/05/2014  Patient/family informed of their freedom to choose among providers that offer the needed level of care, that participate in Medicare, Medicaid or managed care program needed by the patient, have an available bed and are willing to accept the patient.  02/05/2014  Patient/family informed of [facility]' ownership interest in [facility], as well as of the fact that they are under no obligation to receive care at this facility.  PASARR submitted to EDS on 02/05/2014 PASARR number received on 02/05/2014  FL2 transmitted to all facilities in geographic area requested by pt/family on  02/05/2014 FL2 transmitted to all facilities within larger geographic area on 02/05/2014  Patient informed that his/her managed care company has contracts with or will negotiate with  certain facilities, including the following:     Patient/family informed of bed offers received:  02/01/2014 Patient chooses bed at  [facility] Physician recommends and patient chooses bed at    Patient to be transferred to [facility] on 02/07/2014   Patient to be transferred to facility by  [facility] Patient and family notified of transfer on 02/07/2014 Name of family member notified:  Eustaquio Maize (patient daughter)  The following physician request were entered in  Epic:   Additional Comments:

## 2014-02-06 LAB — GLUCOSE, CAPILLARY
GLUCOSE-CAPILLARY: 178 mg/dL — AB (ref 70–99)
Glucose-Capillary: 157 mg/dL — ABNORMAL HIGH (ref 70–99)
Glucose-Capillary: 145 mg/dL — ABNORMAL HIGH (ref 70–99)

## 2014-02-06 LAB — BASIC METABOLIC PANEL
ANION GAP: 8 (ref 5–15)
BUN: 9 mg/dL (ref 6–23)
CHLORIDE: 98 mmol/L (ref 96–112)
CO2: 35 mmol/L — ABNORMAL HIGH (ref 19–32)
Calcium: 8.6 mg/dL (ref 8.4–10.5)
Creatinine, Ser: 0.91 mg/dL (ref 0.50–1.10)
GFR calc Af Amer: 67 mL/min — ABNORMAL LOW (ref >90)
GFR, EST NON AFRICAN AMERICAN: 58 mL/min — AB (ref >90)
Glucose, Bld: 171 mg/dL — ABNORMAL HIGH (ref 70–99)
Potassium: 4.1 mmol/L (ref 3.5–5.1)
Sodium: 141 mmol/L (ref 135–145)

## 2014-02-06 LAB — CBC
HCT: 31.8 % — ABNORMAL LOW (ref 36.0–46.0)
HEMOGLOBIN: 9.5 g/dL — AB (ref 12.0–15.0)
MCH: 30.4 pg (ref 26.0–34.0)
MCHC: 29.9 g/dL — AB (ref 30.0–36.0)
MCV: 101.6 fL — AB (ref 78.0–100.0)
PLATELETS: 142 10*3/uL — AB (ref 150–400)
RBC: 3.13 MIL/uL — ABNORMAL LOW (ref 3.87–5.11)
RDW: 13.4 % (ref 11.5–15.5)
WBC: 8.4 10*3/uL (ref 4.0–10.5)

## 2014-02-06 MED ORDER — LEVOFLOXACIN 500 MG PO TABS
500.0000 mg | ORAL_TABLET | Freq: Every day | ORAL | Status: DC
  Administered 2014-02-06 – 2014-02-07 (×2): 500 mg via ORAL
  Filled 2014-02-06 (×2): qty 1

## 2014-02-06 NOTE — Clinical Social Work Note (Signed)
CSW unable to obtain [facility] authorization for Dc to [facility] today. Facility states they believe they will have auth by 02/07/14. CSW has updated patient, patient's daughter, and charge Therapist, sports.    Liz Beach MSW, [place], [place], [phone]

## 2014-02-06 NOTE — Progress Notes (Signed)
PROGRESS NOTE  [name] [name] UUV:[id number] DOB: Aug 08, 1930 DOA: 02/01/2014 PCP: Cari Caraway, MD  Assessment/Plan: HCAP -Continue Zosyn -d/c vancomycin -Continue bronchodilators -Pulmonary hygiene - the patient will be discharged with Levofloxacin for 4 additional days which will complete 7 days of therapy Acute respiratory failure -Presently stable on 2L -secondary to HCAP -wean oxygen for sat >92% Atypical chest pain -Patient had normal heart catheterization on 01/03/2014 -EKG without any ST-T wave changes -CTA chest negative for pulmonary embolus Atrial Dysrthymia -Patient recently had a two-week event monitor which was negative for atrial fibrillation -Showed the patient had frequent PACs -Continue metoprolol succinate 50 mg twice a day Bacteremia-CoNS -contaminant -Discontinue vancomycin Diabetes mellitus type 2 -02/02/14--Hemoglobin A1c 7.5 -NovoLog sliding scale -The patient's gabapentin dose was decreased and adjusted for her renal function--she will go home with gabapentin 600 mg twice a day -Given improvement in the patient's renal function, the patient will be started back on her usual dose of metformin -The patient's renal function should be monitored regularly in the outpatient setting -Serum creatinine 0.91 on the day of discharge Hypertension -Continue metoprolol succinate -Hold losartan and HCTZ due to soft BP--Blood pressure has remained stable off of these agents -due to pt's aortic stenosis and diabetes mellitus, restart lower dose losartan 25 mg daily Hyperlipidemia -continue Crestor Deconditioning -PT eval -will need SNF     Family Communication:   None today Disposition Plan:   SNF      Procedures/Studies: Dg Chest 2 View (if Patient Has Fever And/or Copd)  01/09/2014   CLINICAL DATA:  Cough and congestion.  Fever.  EXAM: CHEST  2 VIEW  COMPARISON:  05/05/2013  FINDINGS: Multiple leads overlie the patient. Stable cardiac and  mediastinal contours. No large consolidative pulmonary opacities. No pleural effusion or pneumothorax. Mid thoracic spine degenerative change.  IMPRESSION: No acute cardiopulmonary process.   Electronically Signed   By: Lovey Newcomer M.D.   On: 01/09/2014 19:18   Ct Angio Chest Pe W/cm &/or Wo Cm  02/01/2014   CLINICAL DATA:  81 year old with sudden onset of respiratory distress and low oxygen saturations.  EXAM: CT ANGIOGRAPHY CHEST WITH CONTRAST  TECHNIQUE: Multidetector CT imaging of the chest was performed using the standard protocol during bolus administration of intravenous contrast. Multiplanar CT image reconstructions and MIPs were obtained to evaluate the vascular anatomy.  CONTRAST:  62mL OMNIPAQUE IOHEXOL 350 MG/ML SOLN  COMPARISON:  Chest radiograph 02/01/2014.  FINDINGS: There is no evidence for a pulmonary embolism. No significant chest lymphadenopathy. No significant pericardial or pleural fluid. Ascending thoracic aorta is mildly enlarged measuring up to 3.7 cm. Proximal great vessels are patent. Normal caliber of the descending thoracic aorta. Proximal abdominal aorta is patent.  There is motion artifact on this examination. There is a 1.4 cm calcified gallstone. Trachea and mainstem bronchi are patent. There is an irregular nodular lesion involving the posterior right middle lobe on sequence 406, image 64. This area roughly measures up to 1.3 cm. The margins of this density are poorly defined and suspect this is an infectious or inflammatory process. Question a 0.3 cm nodule the right upper lobe on sequence 406, image 31. There are perivascular parenchymal densities in the left lower lobe on sequence 406, image 83. Questionable nodular lesion in the left lower lobe on image 69 roughly measuring 0.4 cm. Subtle nodular density in the right lower lobe measures 0.3 cm on image 53. There are additional small nodules in the right  lower lobe on images 73 and 74.  No acute bone abnormality.  Review of the  MIP images confirms the above findings.  IMPRESSION: Negative for a pulmonary embolism.  Small scattered patchy and nodular densities in both lungs. Findings could represent an acute infectious or inflammatory process. Recommend a 3 month follow up to ensure resolution of the largest area in the right middle lobe.  Cholelithiasis.  Mild enlargement of the ascending thoracic aorta measuring up to 3.7 cm. Recommend annual imaging followup by CTA or MRA. This recommendation follows 2010 ACCF/AHA/AATS/ACR/ASA/SCA/SCAI/SIR/STS/SVM Guidelines for the Diagnosis and Management of Patients with Thoracic Aortic Disease. Circulation. 2010; 121: U932-T557   Electronically Signed   By: Markus Daft M.D.   On: 02/01/2014 21:51   Dg Chest Portable 1 View  02/01/2014   CLINICAL DATA:  Severe respiratory distress and low oxygen saturations.  EXAM: PORTABLE CHEST - 1 VIEW  COMPARISON:  01/09/2014  FINDINGS: Single view of the chest demonstrates clear lungs. There is no significant pulmonary edema or airspace disease. Heart size is within normal limits and stable. Again noted are atherosclerotic calcifications at the aortic arch. Patient is mildly rotated towards the left.  IMPRESSION: No acute chest findings.   Electronically Signed   By: Markus Daft M.D.   On: 02/01/2014 17:52         Subjective: Patient is doing better. She still has some dyspnea on exertion but overall is breathing better. Denies fevers, chills, chest pain, nausea, vomiting, abdominal pain. She has some loose stools.  Objective: Filed Vitals:   02/05/14 1641 02/05/14 2042 02/05/14 2315 02/06/14 0420  BP: 123/48 156/76 124/71 154/86  Pulse: 51 79 87 106  Temp: 97.9 F (36.6 C) 98 F (36.7 C) 98.6 F (37 C) 98 F (36.7 C)  TempSrc: Oral Oral Oral Oral  Resp: 16 20 20 21   Height:      Weight:      SpO2: 100% 99% 100% 100%    Intake/Output Summary (Last 24 hours) at 02/06/14 1025 Last data filed at 02/06/14 1004  Gross per 24 hour    Intake    950 ml  Output    675 ml  Net    275 ml   Weight change:  Exam:   General:  Pt is alert, follows commands appropriately, not in acute distress  HEENT: No icterus, No thrush,  [place]/AT  Cardiovascular: RRR, S1/S2, no rubs, no gallops  Respiratory: Bibasilar crackles, left greater than right. No wheezing.  Abdomen: Soft/+BS, non tender, non distended, no guarding  Extremities: trace LE edema, No lymphangitis, No petechiae, No rashes, no synovitis  Data Reviewed: Basic Metabolic Panel:  Recent Labs Lab 02/01/14 1730 02/02/14 0651 02/03/14 0600 02/04/14 0447 02/06/14 0604  NA 140 141 138 139 141  K 4.1 4.8 4.4 4.2 4.1  CL 99 102 99 99 98  CO2 30 31 31  33* 35*  GLUCOSE 207* 110* 146* 183* 171*  BUN 24* 21 19 12 9   CREATININE 1.19* 1.02 1.19* 0.93 0.91  CALCIUM 8.8 8.0* 8.2* 8.4 8.6   Liver Function Tests: No results for input(s): AST, ALT, ALKPHOS, BILITOT, PROT, ALBUMIN in the last 168 hours. No results for input(s): LIPASE, AMYLASE in the last 168 hours. No results for input(s): AMMONIA in the last 168 hours. CBC:  Recent Labs Lab 02/01/14 1730 02/02/14 0651 02/03/14 0600 02/06/14 0604  WBC 15.9* 10.0 7.2 8.4  NEUTROABS 12.4*  --   --   --   HGB  11.9* 10.1* 9.1* 9.5*  HCT 38.1 32.7* 30.1* 31.8*  MCV 99.5 99.7 100.3* 101.6*  PLT 232 197 172 142*   Cardiac Enzymes:  Recent Labs Lab 02/01/14 1730  TROPONINI <0.03   BNP: Invalid input(s): POCBNP CBG:  Recent Labs Lab 02/05/14 0833 02/05/14 1204 02/05/14 1638 02/05/14 2122 02/06/14 0804  GLUCAP 142* 323* 208* 147* 157*    Recent Results (from the past 240 hour(s))  Blood culture (routine x 2)     Status: None   Collection Time: 02/01/14 10:20 PM  Result Value Ref Range Status   Specimen Description BLOOD ARM RIGHT  Final   Special Requests BOTTLES DRAWN AEROBIC AND ANAEROBIC 10CC  Final   Culture   Final    STAPHYLOCOCCUS SPECIES (COAGULASE NEGATIVE) Note: THE SIGNIFICANCE OF  ISOLATING THIS ORGANISM FROM A SINGLE SET OF BLOOD CULTURES WHEN MULTIPLE SETS ARE DRAWN IS UNCERTAIN. PLEASE NOTIFY THE MICROBIOLOGY DEPARTMENT WITHIN ONE WEEK IF SPECIATION AND SENSITIVITIES ARE REQUIRED. Note: Culture results may be compromised due to an inadequate volume of blood received in culture bottles. Gram Stain Report Called to,Read Back By and Verified With: ASIA JETTER 02/03/14 230AM [place] Performed at [facility]    Report Status 02/04/2014 FINAL  Final  Blood culture (routine x 2)     Status: None (Preliminary result)   Collection Time: 02/01/14 10:32 PM  Result Value Ref Range Status   Specimen Description BLOOD HAND LEFT  Final   Special Requests BOTTLES DRAWN AEROBIC ONLY 5CC  Final   Culture   Final           BLOOD CULTURE RECEIVED NO GROWTH TO DATE CULTURE WILL BE HELD FOR 5 DAYS BEFORE ISSUING A FINAL NEGATIVE REPORT Performed at [facility]    Report Status PENDING  Incomplete     Scheduled Meds: . aspirin EC  81 mg Oral Q M,W,F  . cholecalciferol  1,000 Units Oral Daily  . enoxaparin (LOVENOX) injection  40 mg Subcutaneous Q24H  . fluticasone  2 spray Each Nare Daily  . gabapentin  600 mg Oral BID  . insulin aspart  0-9 Units Subcutaneous TID WC  . metoprolol succinate  50 mg Oral BID  . pantoprazole  40 mg Oral q morning - 10a  . piperacillin-tazobactam (ZOSYN)  IV  3.375 g Intravenous 3 times per day  . rosuvastatin  5 mg Oral QHS   Continuous Infusions:    Katena Petitjean, DO  [facility] Pager [phone]  If 7PM-7AM, please contact night-coverage [email or web address] Password [email or web address] 02/06/2014, 10:25 AM   LOS: 5 days

## 2014-02-07 LAB — GLUCOSE, CAPILLARY
GLUCOSE-CAPILLARY: 159 mg/dL — AB (ref 70–99)
GLUCOSE-CAPILLARY: 229 mg/dL — AB (ref 70–99)

## 2014-02-07 MED ORDER — LEVOFLOXACIN 500 MG PO TABS: 500.0000 mg | ORAL_TABLET | Freq: Every day | ORAL | Status: DC

## 2014-02-07 NOTE — Progress Notes (Signed)
NURSING PROGRESS NOTE  [name] [name] [id number] Discharge Data: 02/07/2014 3:38 PM Attending Provider: No att. providers found VAN:VBTYOMA,YOKHT, MD     Reinaldo Meeker to be D/C'd Home per MD order.  Discussed with the patient the After Visit Summary and all questions fully answered. All IV's discontinued with no bleeding noted. All belongings returned to patient for patient to take home.   Last Vital Signs:  Blood pressure 108/51, pulse 115, temperature 98.2 F (36.8 C), temperature source Oral, resp. rate 25, height 5' 0.5" (1.537 m), weight 100.1 kg (220 lb 10.9 oz), SpO2 97 %.  Discharge Medication List   Medication List    STOP taking these medications        losartan-hydrochlorothiazide 50-12.5 MG per tablet  Commonly known as:  HYZAAR     predniSONE 10 MG tablet  Commonly known as:  DELTASONE      TAKE these medications        albuterol 108 (90 BASE) MCG/ACT inhaler  Commonly known as:  PROVENTIL HFA;VENTOLIN HFA  Inhale 1 puff into the lungs every 6 (six) hours as needed for wheezing or shortness of breath.     aspirin 81 MG tablet  Take 81 mg by mouth every Monday, Wednesday, and Friday. Take 1 on Monday, Wednesday, and Friday only     cholecalciferol 1000 UNITS tablet  Commonly known as:  VITAMIN D  Take 1,000 Units by mouth daily.     fluticasone 50 MCG/ACT nasal spray  Commonly known as:  FLONASE  Place 2 sprays into both nostrils daily.     furosemide 20 MG tablet  Commonly known as:  LASIX  Take 20 mg by mouth as needed for fluid (Left foot swelling).     gabapentin 300 MG capsule  Commonly known as:  NEURONTIN  Take 2 capsules (600 mg total) by mouth 2 (two) times daily.     HYDROcodone-acetaminophen 5-325 MG per tablet  Commonly known as:  NORCO/VICODIN  Take 1-2 tablets by mouth daily as needed for moderate pain.     levofloxacin 500 MG tablet  Commonly known as:  LEVAQUIN  Take 1 tablet (500 mg total) by mouth daily. X 2 more days  Start taking  on:  02/08/2014     losartan 25 MG tablet  Commonly known as:  COZAAR  Take 1 tablet (25 mg total) by mouth daily.     metFORMIN 1000 MG tablet  Commonly known as:  GLUCOPHAGE  Take 1 tablet (1,000 mg total) by mouth 2 (two) times daily with a meal.     metoprolol succinate 50 MG 24 hr tablet  Commonly known as:  TOPROL-XL  Take 1 tablet (50 mg total) by mouth 2 (two) times daily.     nitroGLYCERIN 0.4 MG SL tablet  Commonly known as:  NITROSTAT  Place 1 tablet (0.4 mg total) under the tongue every 5 (five) minutes as needed for chest pain.     pantoprazole 40 MG tablet  Commonly known as:  PROTONIX  Take 40 mg by mouth every morning.     rosuvastatin 5 MG tablet  Commonly known as:  CRESTOR  Take 5 mg by mouth at bedtime.     triamcinolone 0.025 % cream  Commonly known as:  KENALOG  Apply 1 application topically as needed (as needed for leg pain).

## 2014-02-07 NOTE — Discharge Summary (Signed)
Physician Discharge Summary  [name] [name] GLO:[id number] DOB: 1930/05/08 DOA: 02/01/2014  PCP: Cari Caraway, MD  Admit date: 02/01/2014 Discharge date: 02/07/2014  Recommendations for Outpatient Follow-up:  1. Pt will need to follow up with PCP in 2 weeks post discharge 2. Please obtain BMP and CBC in one week 3. Please maintain 2L nasal cannula and wean for oxygen sat >92%  Discharge Diagnoses:  HCAP -Continued Zosyn (over 3 days)and transitioned to po levofloxacin without any difficulty -d/c vancomycin -Continue bronchodilators -Pulmonary hygiene - the patient will be discharged with Levofloxacin for 2 additional days which will complete 8 days of therapy Acute respiratory failure -Presently stable on 2L -secondary to HCAP -wean oxygen for sat >92% Atypical chest pain -Patient had normal heart catheterization on 01/03/2014 -EKG without any ST-T wave changes -CTA chest negative for pulmonary embolus Atrial Dysrthymia -Patient recently had a two-week event monitor which was negative for atrial fibrillation -Showed the patient had frequent PACs -Continue metoprolol succinate 50 mg twice a day Bacteremia-CoNS -contaminant -Discontinue vancomycin Diabetes mellitus type 2 -02/02/14--Hemoglobin A1c 7.5 -NovoLog sliding scale -The patient's gabapentin dose was decreased and adjusted for her renal function--she will go home with gabapentin 600 mg twice a day -Given improvement in the patient's renal function, the patient will be started back on her usual dose of metformin -The patient's renal function should be monitored regularly in the outpatient setting -Serum creatinine 0.91 at time of discharge Hypertension -Continue metoprolol succinate -Hold losartan and HCTZ due to soft BP--Blood pressure has remained stable off of these agents -due to pt's aortic stenosis and diabetes mellitus, restart lower dose losartan 25 mg daily Hyperlipidemia -continue  Crestor Deconditioning -PT eval -will need SNF  Discharge Condition: stable  Disposition: home  Diet:carb modified Wt Readings from Last 3 Encounters:  02/02/14 100.1 kg (220 lb 10.9 oz)  01/19/14 100.608 kg (221 lb 12.8 oz)  01/12/14 103.783 kg (228 lb 12.8 oz)    History of present illness:  81 year old female with history of hypertension, diabetes mellitus, mild aortic stenosis, atypical chest pain with normal heart catheterization 01/03/2014 presented with sudden onset of shortness of breath at 2 PM on 02/01/2014. The patient states that she had a long "coughing spell" during which, the patient had some epistaxis. Associated with her coughing spell, the patient became increasingly short of breath. EMS was activated. Oxygen saturation was noted to be 60%, and the patient was placed on a nonrebreather. CT angiogram of the chest in the emergency department was negative for pulmonary embolus but showed scattered patchy and nodular densities in the bilateral lungs which may represent acute infectious or inflammatory process. The patient was admitted and treated for HCAP. The patient was started on intravenous Zosyn. The patient improved clinically she remained weak. Physical therapy was consulted. It was recommended that the patient go for a short stay at a skilled nursing facility which she agreed.    Discharge Exam: Filed Vitals:   02/07/14 0417  BP:   Pulse:   Temp: 97.5 F (36.4 C)  Resp:    Filed Vitals:   02/06/14 2007 02/06/14 2337 02/07/14 0417 02/07/14 0417  BP: 111/48 126/52 140/51   Pulse: 115     Temp: 97.8 F (36.6 C) 98.2 F (36.8 C) 97.5 F (36.4 C) 97.5 F (36.4 C)  TempSrc: Oral Oral Oral Oral  Resp: 30 28 20    Height:      Weight:      SpO2: 100% 93% 97%    General: A&O x  3, NAD, pleasant, cooperative Cardiovascular: RRR, no rub, no gallop, no S3 Respiratory: bibasilar rales, no wheeze Abdomen:soft, nontender, nondistended, positive bowel  sounds Extremities: No edema, No lymphangitis, no petechiae  Discharge Instructions      Discharge Instructions    Diet - low sodium heart healthy    Complete by:  As directed      Increase activity slowly    Complete by:  As directed             Medication List    STOP taking these medications        losartan-hydrochlorothiazide 50-12.5 MG per tablet  Commonly known as:  HYZAAR     predniSONE 10 MG tablet  Commonly known as:  DELTASONE      TAKE these medications        albuterol 108 (90 BASE) MCG/ACT inhaler  Commonly known as:  PROVENTIL HFA;VENTOLIN HFA  Inhale 1 puff into the lungs every 6 (six) hours as needed for wheezing or shortness of breath.     aspirin 81 MG tablet  Take 81 mg by mouth every Monday, Wednesday, and Friday. Take 1 on Monday, Wednesday, and Friday only     cholecalciferol 1000 UNITS tablet  Commonly known as:  VITAMIN D  Take 1,000 Units by mouth daily.     fluticasone 50 MCG/ACT nasal spray  Commonly known as:  FLONASE  Place 2 sprays into both nostrils daily.     furosemide 20 MG tablet  Commonly known as:  LASIX  Take 20 mg by mouth as needed for fluid (Left foot swelling).     gabapentin 300 MG capsule  Commonly known as:  NEURONTIN  Take 2 capsules (600 mg total) by mouth 2 (two) times daily.     HYDROcodone-acetaminophen 5-325 MG per tablet  Commonly known as:  NORCO/VICODIN  Take 1-2 tablets by mouth daily as needed for moderate pain.     levofloxacin 500 MG tablet  Commonly known as:  LEVAQUIN  Take 1 tablet (500 mg total) by mouth daily. X 2 more days  Start taking on:  02/08/2014     losartan 25 MG tablet  Commonly known as:  COZAAR  Take 1 tablet (25 mg total) by mouth daily.     metFORMIN 1000 MG tablet  Commonly known as:  GLUCOPHAGE  Take 1 tablet (1,000 mg total) by mouth 2 (two) times daily with a meal.     metoprolol succinate 50 MG 24 hr tablet  Commonly known as:  TOPROL-XL  Take 1 tablet (50 mg total)  by mouth 2 (two) times daily.     nitroGLYCERIN 0.4 MG SL tablet  Commonly known as:  NITROSTAT  Place 1 tablet (0.4 mg total) under the tongue every 5 (five) minutes as needed for chest pain.     pantoprazole 40 MG tablet  Commonly known as:  PROTONIX  Take 40 mg by mouth every morning.     rosuvastatin 5 MG tablet  Commonly known as:  CRESTOR  Take 5 mg by mouth at bedtime.     triamcinolone 0.025 % cream  Commonly known as:  KENALOG  Apply 1 application topically as needed (as needed for leg pain).         The results of significant diagnostics from this hospitalization (including imaging, microbiology, ancillary and laboratory) are listed below for reference.    Significant Diagnostic Studies: Dg Chest 2 View (if Patient Has Fever And/or Copd)  01/09/2014   CLINICAL DATA:  Cough and congestion.  Fever.  EXAM: CHEST  2 VIEW  COMPARISON:  05/05/2013  FINDINGS: Multiple leads overlie the patient. Stable cardiac and mediastinal contours. No large consolidative pulmonary opacities. No pleural effusion or pneumothorax. Mid thoracic spine degenerative change.  IMPRESSION: No acute cardiopulmonary process.   Electronically Signed   By: Lovey Newcomer M.D.   On: 01/09/2014 19:18   Ct Angio Chest Pe W/cm &/or Wo Cm  02/01/2014   CLINICAL DATA:  81 year old with sudden onset of respiratory distress and low oxygen saturations.  EXAM: CT ANGIOGRAPHY CHEST WITH CONTRAST  TECHNIQUE: Multidetector CT imaging of the chest was performed using the standard protocol during bolus administration of intravenous contrast. Multiplanar CT image reconstructions and MIPs were obtained to evaluate the vascular anatomy.  CONTRAST:  29mL OMNIPAQUE IOHEXOL 350 MG/ML SOLN  COMPARISON:  Chest radiograph 02/01/2014.  FINDINGS: There is no evidence for a pulmonary embolism. No significant chest lymphadenopathy. No significant pericardial or pleural fluid. Ascending thoracic aorta is mildly enlarged measuring up to 3.7  cm. Proximal great vessels are patent. Normal caliber of the descending thoracic aorta. Proximal abdominal aorta is patent.  There is motion artifact on this examination. There is a 1.4 cm calcified gallstone. Trachea and mainstem bronchi are patent. There is an irregular nodular lesion involving the posterior right middle lobe on sequence 406, image 64. This area roughly measures up to 1.3 cm. The margins of this density are poorly defined and suspect this is an infectious or inflammatory process. Question a 0.3 cm nodule the right upper lobe on sequence 406, image 31. There are perivascular parenchymal densities in the left lower lobe on sequence 406, image 83. Questionable nodular lesion in the left lower lobe on image 69 roughly measuring 0.4 cm. Subtle nodular density in the right lower lobe measures 0.3 cm on image 53. There are additional small nodules in the right lower lobe on images 73 and 74.  No acute bone abnormality.  Review of the MIP images confirms the above findings.  IMPRESSION: Negative for a pulmonary embolism.  Small scattered patchy and nodular densities in both lungs. Findings could represent an acute infectious or inflammatory process. Recommend a 3 month follow up to ensure resolution of the largest area in the right middle lobe.  Cholelithiasis.  Mild enlargement of the ascending thoracic aorta measuring up to 3.7 cm. Recommend annual imaging followup by CTA or MRA. This recommendation follows 2010 ACCF/AHA/AATS/ACR/ASA/SCA/SCAI/SIR/STS/SVM Guidelines for the Diagnosis and Management of Patients with Thoracic Aortic Disease. Circulation. 2010; 121: N361-W431   Electronically Signed   By: Markus Daft M.D.   On: 02/01/2014 21:51   Dg Chest Portable 1 View  02/01/2014   CLINICAL DATA:  Severe respiratory distress and low oxygen saturations.  EXAM: PORTABLE CHEST - 1 VIEW  COMPARISON:  01/09/2014  FINDINGS: Single view of the chest demonstrates clear lungs. There is no significant pulmonary  edema or airspace disease. Heart size is within normal limits and stable. Again noted are atherosclerotic calcifications at the aortic arch. Patient is mildly rotated towards the left.  IMPRESSION: No acute chest findings.   Electronically Signed   By: Markus Daft M.D.   On: 02/01/2014 17:52     Microbiology: Recent Results (from the past 240 hour(s))  Blood culture (routine x 2)     Status: None   Collection Time: 02/01/14 10:20 PM  Result Value Ref Range Status   Specimen Description BLOOD ARM RIGHT  Final   Special Requests BOTTLES  DRAWN AEROBIC AND ANAEROBIC 10CC  Final   Culture   Final    STAPHYLOCOCCUS SPECIES (COAGULASE NEGATIVE) Note: THE SIGNIFICANCE OF ISOLATING THIS ORGANISM FROM A SINGLE SET OF BLOOD CULTURES WHEN MULTIPLE SETS ARE DRAWN IS UNCERTAIN. PLEASE NOTIFY THE MICROBIOLOGY DEPARTMENT WITHIN ONE WEEK IF SPECIATION AND SENSITIVITIES ARE REQUIRED. Note: Culture results may be compromised due to an inadequate volume of blood received in culture bottles. Gram Stain Report Called to,Read Back By and Verified With: ASIA JETTER 02/03/14 230AM [place] Performed at [facility]    Report Status 02/04/2014 FINAL  Final  Blood culture (routine x 2)     Status: None (Preliminary result)   Collection Time: 02/01/14 10:32 PM  Result Value Ref Range Status   Specimen Description BLOOD HAND LEFT  Final   Special Requests BOTTLES DRAWN AEROBIC ONLY 5CC  Final   Culture   Final           BLOOD CULTURE RECEIVED NO GROWTH TO DATE CULTURE WILL BE HELD FOR 5 DAYS BEFORE ISSUING A FINAL NEGATIVE REPORT Performed at [facility]    Report Status PENDING  Incomplete     Labs: Basic Metabolic Panel:  Recent Labs Lab 02/01/14 1730 02/02/14 0651 02/03/14 0600 02/04/14 0447 02/06/14 0604  NA 140 141 138 139 141  K 4.1 4.8 4.4 4.2 4.1  CL 99 102 99 99 98  CO2 30 31 31  33* 35*  GLUCOSE 207* 110* 146* 183* 171*  BUN 24* 21 19 12 9   CREATININE 1.19* 1.02 1.19* 0.93  0.91  CALCIUM 8.8 8.0* 8.2* 8.4 8.6   Liver Function Tests: No results for input(s): AST, ALT, ALKPHOS, BILITOT, PROT, ALBUMIN in the last 168 hours. No results for input(s): LIPASE, AMYLASE in the last 168 hours. No results for input(s): AMMONIA in the last 168 hours. CBC:  Recent Labs Lab 02/01/14 1730 02/02/14 0651 02/03/14 0600 02/06/14 0604  WBC 15.9* 10.0 7.2 8.4  NEUTROABS 12.4*  --   --   --   HGB 11.9* 10.1* 9.1* 9.5*  HCT 38.1 32.7* 30.1* 31.8*  MCV 99.5 99.7 100.3* 101.6*  PLT 232 197 172 142*   Cardiac Enzymes:  Recent Labs Lab 02/01/14 1730  TROPONINI <0.03   BNP: Invalid input(s): POCBNP CBG:  Recent Labs Lab 02/06/14 0804 02/06/14 1147 02/06/14 1719 02/07/14 0748 02/07/14 1200  GLUCAP 157* 178* 145* 159* 229*    Time coordinating discharge:  Greater than 30 minutes  Signed:  Beda Dula, DO [facility] Pager: [phone] 02/07/2014, 12:21 PM

## 2014-02-07 NOTE — Progress Notes (Signed)
RN attempt to call report to [facility] no answer.

## 2014-02-07 NOTE — Clinical Social Work Note (Signed)
Clinical Social Worker facilitated patient discharge including contacting patient family and facility to confirm patient discharge plans.  Clinical information faxed to facility and family agreeable with plan.  CSW arranged ambulance transport via [facility] to [facility].  RN to call report prior to discharge.  Clinical Social Worker will sign off for now as social work intervention is no longer needed. Please consult us again if new need arises.  [name], LCSW [phone] 

## 2014-02-08 ENCOUNTER — Non-Acute Institutional Stay (SKILLED_NURSING_FACILITY): Payer: Medicare HMO | Admitting: Internal Medicine

## 2014-02-08 ENCOUNTER — Telehealth: Payer: Self-pay | Admitting: *Deleted

## 2014-02-08 DIAGNOSIS — J189 Pneumonia, unspecified organism: Secondary | ICD-10-CM

## 2014-02-08 DIAGNOSIS — R5381 Other malaise: Secondary | ICD-10-CM

## 2014-02-08 DIAGNOSIS — I10 Essential (primary) hypertension: Secondary | ICD-10-CM

## 2014-02-08 DIAGNOSIS — E785 Hyperlipidemia, unspecified: Secondary | ICD-10-CM

## 2014-02-08 DIAGNOSIS — E1140 Type 2 diabetes mellitus with diabetic neuropathy, unspecified: Secondary | ICD-10-CM

## 2014-02-08 DIAGNOSIS — I491 Atrial premature depolarization: Secondary | ICD-10-CM

## 2014-02-08 DIAGNOSIS — M792 Neuralgia and neuritis, unspecified: Secondary | ICD-10-CM

## 2014-02-08 LAB — CULTURE, BLOOD (ROUTINE X 2): Culture: NO GROWTH

## 2014-02-08 NOTE — Progress Notes (Signed)
Patient ID: [name] [name], female   DOB: November 11, 1930, 81 y.o.   MRN: [id number]     [facility]   PCP: [facility], MD   Allergies  Allergen Reactions  . Codeine Nausea And Vomiting  . Demerol Other (See Comments)    Drunk feeling"  . Statins Other (See Comments)    "hurt"   . Zetia [Ezetimibe] Other (See Comments)    hurt    Chief Complaint  Patient presents with  . New Admit To SNF     HPI:  81 year old patient is here for short term rehabilitation post hospital admission from 02/01/14-02/07/14 with sudden onset shortness of breath. CTA chest ruled out pulmonary embolism but was suggestive of HCAP. She was started on iv zosyn and made clinical improvement. She had generalized weakness and physical therapy was recommended.  she has pmh of HTN, DM, AS among others. she is currently on antibiotics. She feels the energy is slowly returning to baseline. she had a bowel movement today. she has been sleeping good. On review of chart her bp has been elevated.   Review of Systems:  Constitutional: Negative for fever, chills, diaphoresis.  HENT: Negative for headache, congestion, nasal discharge Eyes: Negative for eye pain, blurred vision, double vision and discharge.  Respiratory: Negative for cough, shortness of breath and wheezing.   Cardiovascular: Negative for chest pain, palpitations, leg swelling.  Gastrointestinal: Negative for heartburn, nausea, vomiting, abdominal pain. Genitourinary: Negative for dysuria, urgency, frequency and flank pain.  Musculoskeletal: Negative for back pain, falls, joint pain. Skin: Negative for itching, rash.  Neurological: Negative for dizziness, tingling, focal weakness Psychiatric/Behavioral: Negative for depression, anxiety, insomnia and memory loss.    Past Medical History  Diagnosis Date  . Mild aortic stenosis     a. 10/2010 Echo: Ef 60-65%, no rwma, mild LVH no AS.  Marland Kitchen Chronic back pain   . Peripheral  neuropathy   . GERD (gastroesophageal reflux disease)   . Hypertension   . History of pneumonia   . History of bronchitis   . Basal cell carcinoma     a. 2013.  . Non-cardiac chest pain     LHC w/ nl coronaries and nl EF 60% -12/2013  . PAC (premature atrial contraction)   . Heart murmur   . Type II diabetes mellitus     TYPE 2   Past Surgical History  Procedure Laterality Date  . Vesicovaginal fistula closure w/ tah  1964  . US echocardiography  04-26-09    EF 55-60%  . Cardiovascular stress test  05-13-2005    EF 67%  . Knee surgery    . Joint replacement  2000    left knee  . Abdominal hysterectomy  1964  . Toe shortened      right 2nd toe  . Cataract extraction      both eyes  . Mass excision  01/13/2012    Procedure: EXCISION MASS;  Surgeon: Ralene Ok, MD;  Location: [facility];  Service: General;  Laterality: Right;  Excision of Right Back Mass  . Left heart catheterization with coronary angiogram N/A 01/03/2014    Procedure: LEFT HEART CATHETERIZATION WITH CORONARY ANGIOGRAM;  Surgeon: Lorretta Harp, MD;  Location: [facility] CATH LAB;  Service: Cardiovascular;  Laterality: N/A;  . Cardiac catheterization    . Eye surgery      cataract   Social History:   reports that she quit smoking about 34 years ago. Her smoking use included  Cigarettes. She has a 2.5 pack-year smoking history. She has never used smokeless tobacco. She reports that she does not drink alcohol or use illicit drugs.  Family History  Problem Relation Age of Onset  . Other Mother     died @ 37, complications following childbirth  . COPD Father     died @ 52  . Heart failure Father   . Irregular heart beat Sister     1 sister with PPM.    Medications: Patient's Medications  New Prescriptions   No medications on file  Previous Medications   ALBUTEROL (PROVENTIL HFA;VENTOLIN HFA) 108 (90 BASE) MCG/ACT INHALER    Inhale 1 puff into the lungs every 6 (six) hours as needed for wheezing or shortness of  breath.   ASPIRIN 81 MG TABLET    Take 81 mg by mouth every Monday, Wednesday, and Friday. Take 1 on Monday, Wednesday, and Friday only   CHOLECALCIFEROL (VITAMIN D) 1000 UNITS TABLET    Take 1,000 Units by mouth daily.   FLUTICASONE (FLONASE) 50 MCG/ACT NASAL SPRAY    Place 2 sprays into both nostrils daily.   FUROSEMIDE (LASIX) 20 MG TABLET    Take 20 mg by mouth as needed for fluid (Left foot swelling).   GABAPENTIN (NEURONTIN) 300 MG CAPSULE    Take 2 capsules (600 mg total) by mouth 2 (two) times daily.   HYDROCODONE-ACETAMINOPHEN (NORCO/VICODIN) 5-325 MG PER TABLET    Take 1-2 tablets by mouth daily as needed for moderate pain.   LEVOFLOXACIN (LEVAQUIN) 500 MG TABLET    Take 1 tablet (500 mg total) by mouth daily. X 2 more days   LOSARTAN (COZAAR) 25 MG TABLET    Take 1 tablet (25 mg total) by mouth daily.   METFORMIN (GLUCOPHAGE) 1000 MG TABLET    Take 1 tablet (1,000 mg total) by mouth 2 (two) times daily with a meal.   METOPROLOL SUCCINATE (TOPROL-XL) 50 MG 24 HR TABLET    Take 1 tablet (50 mg total) by mouth 2 (two) times daily.   NITROGLYCERIN (NITROSTAT) 0.4 MG SL TABLET    Place 1 tablet (0.4 mg total) under the tongue every 5 (five) minutes as needed for chest pain.   PANTOPRAZOLE (PROTONIX) 40 MG TABLET    Take 40 mg by mouth every morning.    ROSUVASTATIN (CRESTOR) 5 MG TABLET    Take 5 mg by mouth at bedtime.   TRIAMCINOLONE (KENALOG) 0.025 % CREAM    Apply 1 application topically as needed (as needed for leg pain).   Modified Medications   No medications on file  Discontinued Medications   No medications on file     Physical Exam: Filed Vitals:   02/08/14 1702  BP: 157/81  Pulse: 77  Temp: 97.8 F (36.6 C)  Resp: 18  SpO2: 96%    General- elderly female, well built, in no acute distress Head- normocephalic, atraumatic Nose- no maxillary or frontal sinus tenderness, no nasal discharge Throat- moist mucus membrane Neck- no cervical lymphadenopathy Cardiovascular-  normal s1,s2, no murmurs, palpable dorsalis pedis and radial pulses, trace leg edema Respiratory- bilateral clear to auscultation, no wheeze, no rhonchi, no crackles, no use of accessory muscles, on o2 by nasal canula Abdomen- bowel sounds present, soft, non tender Musculoskeletal- able to move all 4 extremities, generalized weakness Neurological- no focal deficit Skin- warm and dry Psychiatry- alert and oriented to person, place and time, normal mood and affect    Labs reviewed: Basic Metabolic Panel:  Recent Labs  01/02/14 1540 01/03/14 1200  02/03/14 0600 02/04/14 0447 02/06/14 0604  NA 141 141  < > 138 139 141  K 4.1 4.7  < > 4.4 4.2 4.1  CL 97 99  < > 99 99 98  CO2 36* 33*  < > 31 33* 35*  GLUCOSE 73 117*  < > 146* 183* 171*  BUN 20 18  < > 19 12 9   CREATININE 0.9 0.93  < > 1.19* 0.93 0.91  CALCIUM 8.6 8.9  < > 8.2* 8.4 8.6  MG 1.6 1.5  --   --   --   --   < > = values in this interval not displayed. Liver Function Tests:  Recent Labs  01/03/14 1200 01/10/14 0703  AST 17 21  ALT 11 11  ALKPHOS 71 84  BILITOT 0.4 0.4  PROT 6.4 7.0  ALBUMIN 3.6 3.4*   No results for input(s): LIPASE, AMYLASE in the last 8760 hours. No results for input(s): AMMONIA in the last 8760 hours. CBC:  Recent Labs  01/09/14 1938 01/10/14 0703  02/01/14 1730 02/02/14 0651 02/03/14 0600 02/06/14 0604  WBC 14.4* 16.0*  < > 15.9* 10.0 7.2 8.4  NEUTROABS 11.3* 15.3*  --  12.4*  --   --   --   HGB 10.3* 10.5*  < > 11.9* 10.1* 9.1* 9.5*  HCT 33.8* 34.0*  < > 38.1 32.7* 30.1* 31.8*  MCV 99.1 102.1*  < > 99.5 99.7 100.3* 101.6*  PLT 225 209  < > 232 197 172 142*  < > = values in this interval not displayed. Cardiac Enzymes:  Recent Labs  01/03/14 1200 01/09/14 1938 02/01/14 1730  TROPONINI <0.03 <0.03 <0.03   BNP: Invalid input(s): POCBNP CBG:  Recent Labs  02/06/14 1719 02/07/14 0748 02/07/14 1200  GLUCAP 145* 159* 229*    Assessment/Plan  Physical  deconditioning Here for rehabilitation. Will have her work with physical therapy and occupational therapy team to help with gait training and muscle strengthening exercises.fall precautions. Skin care. Encourage to be out of bed.   HCAP Breathing improved, continue levaquin and complete course tomorrow. Wean off o2 astolerated to keep o2 saturation > 90% with exertion. Encouraged to use her incentive spirometer. Continue bronchodilators.  HTN bp elevated in facility. Increase her losartan to 37.5 mg daily for now, monitor bp for goal < 140/90. Continue toprol xl  Atrial Dysrthymia Patient recently had a two-week event monitor which was negative for atrial fibrillation. Showed the patient had frequent PACs. Continue metoprolol succinate 50 mg twice a day  Diabetes mellitus type 2 a1c 7.5 on 02/02/14. Continue metformin 1000 mg bid and monitor cbg. Continue aspirin and crestor with adjusted dosing of losartan.  Neuropathic pain Stable, continue gabapentin 600 mg bid for now and monitor  Hyperlipidemia continue Crestor   Goals of care: short term rehabilitation  Labs/tests ordered: cbc, bmp in 1 week  Family/ staff Communication: reviewed care plan with patient and nursing supervisor    Blanchie Serve, MD  [place] [phone] (Monday-Friday 8 am - 5 pm) [phone] (afterhours)

## 2014-02-08 NOTE — Telephone Encounter (Signed)
Advised patient.   30 day event monitor reviewed by  Dr. Mare Ferrari   Interpretation: sinus tachycardia frequent runs of PAC's

## 2014-02-22 ENCOUNTER — Non-Acute Institutional Stay (SKILLED_NURSING_FACILITY): Payer: Medicare HMO | Admitting: Adult Health

## 2014-02-22 DIAGNOSIS — E785 Hyperlipidemia, unspecified: Secondary | ICD-10-CM | POA: Diagnosis not present

## 2014-02-22 DIAGNOSIS — M792 Neuralgia and neuritis, unspecified: Secondary | ICD-10-CM

## 2014-02-22 DIAGNOSIS — R5381 Other malaise: Secondary | ICD-10-CM

## 2014-02-22 DIAGNOSIS — J189 Pneumonia, unspecified organism: Secondary | ICD-10-CM | POA: Diagnosis not present

## 2014-02-22 DIAGNOSIS — I10 Essential (primary) hypertension: Secondary | ICD-10-CM

## 2014-02-22 DIAGNOSIS — E1140 Type 2 diabetes mellitus with diabetic neuropathy, unspecified: Secondary | ICD-10-CM | POA: Diagnosis not present

## 2014-02-22 DIAGNOSIS — I491 Atrial premature depolarization: Secondary | ICD-10-CM

## 2014-03-03 ENCOUNTER — Encounter: Payer: Self-pay | Admitting: Internal Medicine

## 2014-03-03 ENCOUNTER — Institutional Professional Consult (permissible substitution): Payer: Medicare HMO | Admitting: Internal Medicine

## 2014-03-03 ENCOUNTER — Ambulatory Visit (INDEPENDENT_AMBULATORY_CARE_PROVIDER_SITE_OTHER): Payer: Medicare HMO | Admitting: Internal Medicine

## 2014-03-03 VITALS — BP 102/64 | HR 77 | Ht 63.0 in | Wt 222.0 lb

## 2014-03-03 DIAGNOSIS — R059 Cough, unspecified: Secondary | ICD-10-CM

## 2014-03-03 DIAGNOSIS — R0602 Shortness of breath: Secondary | ICD-10-CM

## 2014-03-03 DIAGNOSIS — R9389 Abnormal findings on diagnostic imaging of other specified body structures: Secondary | ICD-10-CM

## 2014-03-03 DIAGNOSIS — R05 Cough: Secondary | ICD-10-CM

## 2014-03-03 DIAGNOSIS — R938 Abnormal findings on diagnostic imaging of other specified body structures: Secondary | ICD-10-CM

## 2014-03-03 NOTE — Patient Instructions (Addendum)
Plan A =automatic = symbicort 80 Take 2 puffs first thing in am and then another 2 puffs about 12 hours later.   Work on inhaler technique:  relax and gently blow all the way out then take a nice smooth deep breath back in, triggering the inhaler at same time you start breathing in.  Hold for up to 5 seconds if you can.  Rinse and gargle with water when done   Plan B  Only use your albuterol (proair) as a rescue medication to be used if you can't catch your breath by resting or doing a relaxed purse lip breathing pattern.  - The less you use it, the better it will work when you need it. - Ok to use up to 2 puffs  every 4 hours if you must but call for immediate appointment if use goes up over your usual need - Don't leave home without it !!  (think of it like the spare tire for your car)   Plan C = crisis =duoneb  Only use duoneb if use plan B and it doesn't work.  Use 02 2lpm with activity and at bedtime  Add pepcid 20 mg one at bedtime  GERD (REFLUX)  is an extremely common cause of respiratory symptoms just like yours , many times with no obvious heartburn at all.    It can be treated with medication, but also with lifestyle changes including avoidance of late meals, excessive alcohol, smoking cessation, and avoid fatty foods, chocolate, peppermint, colas, red wine, and acidic juices such as orange juice.  NO MINT OR MENTHOL PRODUCTS SO NO COUGH DROPS  USE SUGARLESS CANDY INSTEAD (Jolley ranchers or Stover's or Life Savers) or even ice chips will also do - the key is to swallow to prevent all throat clearing. NO OIL BASED VITAMINS - use powdered substitutes.   Please schedule a follow up office visit in 4 weeks, sooner if needed

## 2014-03-03 NOTE — Progress Notes (Signed)
Subjective:    Patient ID: [name] [name], female    DOB: 11/24/1930,  MRN: [id number]  HPI  87 yowf quit smoking 1970s s sequelae slowed somewhat by back / leg problems x 2012 with h/o sob x one flight x 2000 started Nov 2015 on sporadic symbicort /saba  then 1st of December 2015 onset of cough indolent but gradually worse and refractory to out pt rx so admitted:  Admit date: 02/01/2014 Discharge date: 02/07/2014  Recommendations for Outpatient Follow-up:  1. Pt will need to follow up with PCP in 2 weeks post discharge 2. Please obtain BMP and CBC in one week 3. Please maintain 2L nasal cannula and wean for oxygen sat >92%  Discharge Diagnoses:  HCAP (note there was no def as dz on cxr  On same day CT showed "patches") -Continued Zosyn (over 3 days)and transitioned to po levofloxacin without any difficulty -d/c vancomycin -Continue bronchodilators -Pulmonary hygiene - the patient will be discharged with Levofloxacin for 2 additional days which will complete 8 days of therapy Acute respiratory failure -Presently stable on 2L -secondary to HCAP -wean oxygen for sat >92% Atypical chest pain -Patient had normal heart catheterization on 01/03/2014 -EKG without any ST-T wave changes -CTA chest negative for pulmonary embolus Atrial Dysrthymia -Patient recently had a two-week event monitor which was negative for atrial fibrillation -Showed the patient had frequent PACs -Continue metoprolol succinate 50 mg twice a day Bacteremia-CoNS -contaminant -Discontinue vancomycin Diabetes mellitus type 2 -02/02/14--Hemoglobin A1c 7.5 -NovoLog sliding scale -The patient's gabapentin dose was decreased and adjusted for her renal function--she will go home with gabapentin 600 mg twice a day -Given improvement in the patient's renal function, the patient will be started back on her usual dose of metformin -The patient's renal function should be monitored regularly in the outpatient  setting -Serum creatinine 0.91 at time of discharge Hypertension -Continue metoprolol succinate -Hold losartan and HCTZ due to soft BP--Blood pressure has remained stable off of these agents -due to pt's aortic stenosis and diabetes mellitus, restart lower dose losartan 25 mg daily Hyperlipidemia -continue Crestor Deconditioning -PT eval -will need SNF      03/03/2014 1st [facility] office visit/ Melvyn Novas  / consult requested by Dr Leonides Schanz Chief Complaint  Patient presents with  . Pulmonary Consult    Referred by Dr. Theadore Nan. Pt states that her breathing is "fair".  She has had a little bit of wheezing.   walking s stopping x 8 min s 02 , cough persists but improved, minimal mucoid sputum esp in am   No obvious other patterns in day to day or daytime variabilty or assoc  cp or chest tightness,  overt sinus or hb symptoms. No unusual exp hx or h/o childhood pna/ asthma or knowledge of premature birth.  Sleeping ok without nocturnal  or early am exacerbation  of respiratory  c/o's or need for noct saba. Also denies any obvious fluctuation of symptoms with weather or environmental changes or other aggravating or alleviating factors except as outlined above   Current Medications, Allergies, Complete Past Medical History, Past Surgical History, Family History, and Social History were reviewed in [employer] record.             Review of Systems  Constitutional: Negative for fever, chills and unexpected weight change.  HENT: Negative for congestion, dental problem, ear pain, nosebleeds, postnasal drip, rhinorrhea, sinus pressure, sneezing, sore throat, trouble swallowing and voice change.   Eyes: Negative for visual  disturbance.  Respiratory: Negative for cough, choking and shortness of breath.   Cardiovascular: Negative for chest pain and leg swelling.  Gastrointestinal: Negative for vomiting, abdominal pain and diarrhea.  Genitourinary: Negative for  difficulty urinating.  Musculoskeletal: Negative for arthralgias.  Skin: Negative for rash.  Neurological: Negative for tremors, syncope and headaches.  Hematological: Does not bruise/bleed easily.       Objective:   Physical Exam  Obese wf unable to get up on exam table/ w/c bound  Wt Readings from Last 3 Encounters:  03/03/14 100.699 kg (222 lb)  02/02/14 100.1 kg (220 lb 10.9 oz)  01/19/14 100.608 kg (221 lb 12.8 oz)    Vital signs reviewed  HEENT: nl dentition, turbinates, and orophanx. Nl external ear canals without cough reflex   NECK :  without JVD/Nodes/TM/ nl carotid upstrokes bilaterally   LUNGS: no acc muscle use, clear to A and P bilaterally without cough on insp or exp maneuvers   CV:  RRR  no s3 or murmur or increase in P2, 1+ bialteral lower ext  edema "much better now"  ABD:  soft and nontender with nl excursion in the supine position. No bruits or organomegaly, bowel sounds nl  MS:  warm without deformities, calf tenderness, cyanosis or clubbing  SKIN: warm and dry without lesions    NEURO:  alert, approp, no deficits   Labs ordered/ reviewed   Lab Results  Component Value Date   WBC 8.4 02/06/2014   HGB 9.5* 02/06/2014   HCT 31.8* 02/06/2014   MCV 101.6* 02/06/2014   PLT 142* 02/06/2014       Chemistry      Component Value Date/Time   NA 141 02/06/2014 0604   K 4.1 02/06/2014 0604   CL 98 02/06/2014 0604   CO2 35* 02/06/2014 0604   BUN 9 02/06/2014 0604   CREATININE 0.91 02/06/2014 0604      Component Value Date/Time   CALCIUM 8.6 02/06/2014 0604   ALKPHOS 84 01/10/2014 0703   AST 21 01/10/2014 0703   ALT 11 01/10/2014 0703   BILITOT 0.4 01/10/2014 0703       Lab Results  Component Value Date   TSH 0.99 01/02/2014     BNP < 100 02/01/14  I personally reviewed images and agree with radiology impression as follows:  CTa 02/01/14 Negative for a pulmonary embolism. Small scattered patchy and nodular densities in both lungs.  Findings could represent an acute infectious or inflammatory process. Recommend a 3 month follow up to ensure resolution of the largest area in the right middle lobe.          Assessment & Plan:

## 2014-03-04 ENCOUNTER — Encounter: Payer: Self-pay | Admitting: Internal Medicine

## 2014-03-04 DIAGNOSIS — R9389 Abnormal findings on diagnostic imaging of other specified body structures: Secondary | ICD-10-CM | POA: Insufficient documentation

## 2014-03-04 MED ORDER — FAMOTIDINE 20 MG PO TABS: ORAL_TABLET | ORAL | Status: DC

## 2014-03-04 NOTE — Assessment & Plan Note (Signed)
Symptoms are markedly disproportionate to objective findings and not clear this is a lung problem but pt does appear to have difficult airway management issues.   DDX of  difficult airways management all start with A and  include Adherence, Ace Inhibitors, Acid Reflux, Active Sinus Disease, Alpha 1 Antitripsin deficiency, Anxiety masquerading as Airways dz,  ABPA,  allergy(esp in young), Aspiration (esp in elderly), Adverse effects of DPI,  Active smokers, plus two Bs  = Bronchiectasis and Beta blocker use..and one C= CHF  ? Acid (or non-acid) GERD > always difficult to exclude as up to 75% of pts in some series report no assoc GI/ Heartburn symptoms> rec max (24h)  acid suppression and diet restrictions/ reviewed and instructions given in writing.   ? Allergy/asthma > symbicort 80 2bid The proper method of use, as well as anticipated side effects, of a metered-dose inhaler are discussed and demonstrated to the patient. Improved effectiveness after extensive coaching during this visit to a level of approximately  75% > ok to use duoneb prn   ? Aspiration > nothing to suggest this   ? chf > not likely with BNP < 100  No need for 02 at rest but for now would wear with exertion and hs until sort out her problems on return

## 2014-03-04 NOTE — Assessment & Plan Note (Signed)
The most common causes of chronic cough in immunocompetent adults include the following: upper airway cough syndrome (UACS), previously referred to as postnasal drip syndrome (PNDS), which is caused by variety of rhinosinus conditions; (2) asthma; (3) GERD; (4) chronic bronchitis from cigarette smoking or other inhaled environmental irritants; (5) nonasthmatic eosinophilic bronchitis; and (6) bronchiectasis.   These conditions, singly or in combination, have accounted for up to 94% of the causes of chronic cough in prospective studies.   Other conditions have constituted no >6% of the causes in prospective studies These have included bronchogenic carcinoma, chronic interstitial pneumonia, sarcoidosis, left ventricular failure, ACEI-induced cough, and aspiration from a condition associated with pharyngeal dysfunction.    Chronic cough is often simultaneously caused by more than one condition. A single cause has been found from 38 to 82% of the time, multiple causes from 18 to 62%. Multiply caused cough has been the result of three diseases up to 42% of the time.       For now will rx as UACS vs cough variant asthma > See instructions for specific recommendations which were reviewed directly with the patient who was given a copy with highlighter outlining the key components.

## 2014-03-04 NOTE — Assessment & Plan Note (Signed)
Although there are clearly abnormalities on CT scan, they should probably be considered "microscopic" since not obvious on plain cxr and unable to say whether these have anything to do with her recent symptoms but certainly seems to be getting better so will leave well enough along for now      In the setting of obvious "macroscopic" health issues,  I am very reluctatnt to embark on an invasive w/u at this point but will arrange consevative  follow up and in the meantime see what we can do to address the patient's subjective concerns.   See instructions for specific recommendations which were reviewed directly with the patient who was given a copy with highlighter outlining the key components.

## 2014-03-06 ENCOUNTER — Other Ambulatory Visit: Payer: Self-pay | Admitting: Internal Medicine

## 2014-03-06 MED ORDER — FAMOTIDINE 20 MG PO TABS: ORAL_TABLET | ORAL | Status: DC

## 2014-03-07 ENCOUNTER — Institutional Professional Consult (permissible substitution): Payer: Medicare HMO | Admitting: Internal Medicine

## 2014-03-14 ENCOUNTER — Telehealth: Payer: Self-pay | Admitting: Internal Medicine

## 2014-03-14 MED ORDER — AEROCHAMBER MV MISC: Status: DC

## 2014-03-14 NOTE — Telephone Encounter (Signed)
Spoke with patient daughter, aware of rec's per MW  Spacer rx sent to [facility].  Nothing further needed.

## 2014-03-14 NOTE — Telephone Encounter (Signed)
Spoke with pt's sister. States that pt is having issues with coordination of using Symbicort. Would like for a spacer to be sent in.  MW - please advise. Thanks.

## 2014-03-14 NOTE — Telephone Encounter (Signed)
Fine with me but will need to return with spacer hand if not doing well so we can verify this is going to work and if not consider other options

## 2014-03-20 ENCOUNTER — Encounter: Payer: Self-pay | Admitting: Physical Medicine & Rehabilitation

## 2014-03-20 ENCOUNTER — Other Ambulatory Visit: Payer: Self-pay | Admitting: Physical Medicine & Rehabilitation

## 2014-03-20 ENCOUNTER — Ambulatory Visit (HOSPITAL_BASED_OUTPATIENT_CLINIC_OR_DEPARTMENT_OTHER): Payer: Medicare HMO | Admitting: Physical Medicine & Rehabilitation

## 2014-03-20 ENCOUNTER — Encounter: Payer: Medicare HMO | Attending: Physical Medicine & Rehabilitation

## 2014-03-20 VITALS — BP 109/82 | HR 114 | Resp 14

## 2014-03-20 DIAGNOSIS — M47817 Spondylosis without myelopathy or radiculopathy, lumbosacral region: Secondary | ICD-10-CM | POA: Diagnosis not present

## 2014-03-20 DIAGNOSIS — Z5181 Encounter for therapeutic drug level monitoring: Secondary | ICD-10-CM

## 2014-03-20 DIAGNOSIS — E1142 Type 2 diabetes mellitus with diabetic polyneuropathy: Secondary | ICD-10-CM

## 2014-03-20 DIAGNOSIS — G629 Polyneuropathy, unspecified: Secondary | ICD-10-CM

## 2014-03-20 DIAGNOSIS — G894 Chronic pain syndrome: Secondary | ICD-10-CM

## 2014-03-20 DIAGNOSIS — Z79899 Other long term (current) drug therapy: Secondary | ICD-10-CM

## 2014-03-20 NOTE — Patient Instructions (Signed)
Call me if you would like outpt therapy

## 2014-03-20 NOTE — Progress Notes (Signed)
Subjective:    Patient ID: [name] [name], female    DOB: [birth year], 81 y.o.   MRN: [id number]  HPI 81 year old female seen at this clinic primarily for chronic pain related to diabetic neuropathyAs well as chronic low back pain. Developed acute onset shortness of breath 02/01/2014, was transported to the emergency department via ambulance, diagnosed with pneumonia, treated with IV antibiotics and then sent to [facility] to get rehabilitation. She stayed there for 21 days, has followed up with pulmonologist, has completed antibiotics.  Has had good results with right-sided medial branch blocks performed in November and December 2015 Had left-sided L3 L4 L5 radiofrequency procedure, In November 2014  Pain Inventory Average Pain 4 Pain Right Now 4 My pain is aching  In the last 24 hours, has pain interfered with the following? General activity 0 Relation with others 0 Enjoyment of life 0 What TIME of day is your pain at its worst? evening, night Sleep (in general) Poor  Pain is worse with: walking, bending and standing Pain improves with: no selection Relief from Meds: 0  Mobility use a cane use a walker Do you have any goals in this area?  no  Function Do you have any goals in this area?  no  Neuro/Psych No problems in this area  Prior Studies Any changes since last visit?  no  Physicians involved in your care Any changes since last visit?  no   Family History  Problem Relation Age of Onset  . Other Mother     died @ 37, complications following childbirth  . COPD Father     died @ 28  . Heart failure Father   . Irregular heart beat Sister     1 sister with PPM.   History   Social History  . Marital Status: Married    Spouse Name: N/A  . Number of Children: N/A  . Years of Education: N/A   Social History Main Topics  . Smoking status: Former Smoker -- 0.50 packs/day for 5 years    Types: Cigarettes    Quit date: 01/14/1980  . Smokeless tobacco:  Never Used  . Alcohol Use: No  . Drug Use: No  . Sexual Activity: Not on file   Other Topics Concern  . None   Social History Narrative   Lives with Husband in [place].  Has a dtr in [place], [place] and a son in [place], [place].   Past Surgical History  Procedure Laterality Date  . Vesicovaginal fistula closure w/ tah  1964  . US echocardiography  04-26-09    EF 55-60%  . Cardiovascular stress test  05-13-2005    EF 67%  . Knee surgery    . Joint replacement  2000    left knee  . Abdominal hysterectomy  1964  . Toe shortened      right 2nd toe  . Cataract extraction      both eyes  . Mass excision  01/13/2012    Procedure: EXCISION MASS;  Surgeon: Ralene Ok, MD;  Location: [facility]S;  Service: General;  Laterality: Right;  Excision of Right Back Mass  . Left heart catheterization with coronary angiogram N/A 01/03/2014    Procedure: LEFT HEART CATHETERIZATION WITH CORONARY ANGIOGRAM;  Surgeon: Lorretta Harp, MD;  Location: [facility] CATH LAB;  Service: Cardiovascular;  Laterality: N/A;  . Cardiac catheterization    . Eye surgery      cataract   Past Medical History  Diagnosis Date  .  Mild aortic stenosis     a. 10/2010 Echo: Ef 60-65%, no rwma, mild LVH no AS.  Marland Kitchen Chronic back pain   . Peripheral neuropathy   . GERD (gastroesophageal reflux disease)   . Hypertension   . History of pneumonia   . History of bronchitis   . Basal cell carcinoma     a. 2013.  . Non-cardiac chest pain     LHC w/ nl coronaries and nl EF 60% -12/2013  . PAC (premature atrial contraction)   . Heart murmur   . Type II diabetes mellitus     TYPE 2   There were no vitals taken for this visit.  Opioid Risk Score:   Fall Risk Score: Low Fall Risk (0-5 points)  Review of Systems  All other systems reviewed and are negative.      Objective:   Physical Exam  Constitutional: She is oriented to person, place, and time.  Neurological: She is alert and oriented to person, place, and time.    Psychiatric: She has a normal mood and affect.  Nursing note and vitals reviewed.  Back has decreased range of motion with flexion and extension lateral bending and rotation however no pain with range of motion No tenderness palpation bilateral lumbar L3-L4 L5-S1 paraspinal muscle groups. No tenderness palpation over the greater trochanter of the hips   Feet show no evidence of skin breakdown there is some stasis dermatitis pretibial bilaterally as well as on the feet. Mild edema right foot greater than left foot Decreased sensation left foot to slight touch, reduced sensation light touch right foot stocking distribution. No numbness in the fingers       Assessment & Plan:  1.  Lumbar spondylosis improved after Right L3,4,5 RF And Left L3-4-5 Medial branch blocks x 2 Nov and Dec Off of all narcotic analgesic medications. Doing quite well from a back standpoint. She'll follow-up with me in about 2 months   2.  Diabetic Neuropathy no skin breakdown but reduced pulses and sensation- monitor May benefit from vascular sudden studies bilateral arterial Dopplers deferred to primary care physician on this.

## 2014-03-22 LAB — PMP ALCOHOL METABOLITE (ETG): ETGU: NEGATIVE ng/mL

## 2014-03-25 LAB — OPIATES/OPIOIDS (LC/MS-MS)
Codeine Urine: NEGATIVE ng/mL (ref <50)
HYDROMORPHONE: NEGATIVE ng/mL — AB (ref <50)
Hydrocodone: NEGATIVE ng/mL — AB (ref <50)
Morphine Urine: NEGATIVE ng/mL (ref <50)
NORHYDROCODONE, UR: NEGATIVE ng/mL — AB (ref <50)
Noroxycodone, Ur: NEGATIVE ng/mL (ref <50)
Oxycodone, ur: NEGATIVE ng/mL (ref <50)
Oxymorphone: NEGATIVE ng/mL (ref <50)

## 2014-03-28 LAB — PRESCRIPTION MONITORING PROFILE (SOLSTAS)
AMPHETAMINE/METH: NEGATIVE ng/mL
BARBITURATE SCREEN, URINE: NEGATIVE ng/mL
BUPRENORPHINE, URINE: NEGATIVE ng/mL
Benzodiazepine Screen, Urine: NEGATIVE ng/mL
Cannabinoid Scrn, Ur: NEGATIVE ng/mL
Carisoprodol, Urine: NEGATIVE ng/mL
Cocaine Metabolites: NEGATIVE ng/mL
Creatinine, Urine: 215.41 mg/dL (ref >20.0)
FENTANYL URINE: NEGATIVE ng/mL
MDMA URINE: NEGATIVE ng/mL
Meperidine, Ur: NEGATIVE ng/mL
Methadone Screen, Urine: NEGATIVE ng/mL
Nitrites, Initial: NEGATIVE ug/mL
OPIATE SCREEN, URINE: NEGATIVE ng/mL
Oxycodone Screen, Ur: NEGATIVE ng/mL
Propoxyphene: NEGATIVE ng/mL
TAPENTADOLUR: NEGATIVE ng/mL
Tramadol Scrn, Ur: NEGATIVE ng/mL
Zolpidem, Urine: NEGATIVE ng/mL
pH, Initial: 5.2 pH (ref 4.5–8.9)

## 2014-03-31 ENCOUNTER — Ambulatory Visit (INDEPENDENT_AMBULATORY_CARE_PROVIDER_SITE_OTHER)
Admission: RE | Admit: 2014-03-31 | Discharge: 2014-03-31 | Disposition: A | Discharge status: Home / Self Care | Payer: Medicare HMO | Source: Ambulatory Visit | Attending: Internal Medicine | Admitting: Internal Medicine

## 2014-03-31 ENCOUNTER — Encounter: Payer: Self-pay | Admitting: Internal Medicine

## 2014-03-31 ENCOUNTER — Encounter (INDEPENDENT_AMBULATORY_CARE_PROVIDER_SITE_OTHER): Payer: Self-pay

## 2014-03-31 ENCOUNTER — Ambulatory Visit (INDEPENDENT_AMBULATORY_CARE_PROVIDER_SITE_OTHER): Payer: Medicare HMO | Admitting: Internal Medicine

## 2014-03-31 VITALS — BP 120/80 | HR 76 | Ht 63.0 in | Wt 225.0 lb

## 2014-03-31 DIAGNOSIS — R05 Cough: Secondary | ICD-10-CM

## 2014-03-31 DIAGNOSIS — R9389 Abnormal findings on diagnostic imaging of other specified body structures: Secondary | ICD-10-CM

## 2014-03-31 DIAGNOSIS — R0602 Shortness of breath: Secondary | ICD-10-CM

## 2014-03-31 DIAGNOSIS — R938 Abnormal findings on diagnostic imaging of other specified body structures: Secondary | ICD-10-CM

## 2014-03-31 DIAGNOSIS — R059 Cough, unspecified: Secondary | ICD-10-CM

## 2014-03-31 NOTE — Progress Notes (Signed)
Urine drug screen for this encounter is consistent for prescribed medication 

## 2014-03-31 NOTE — Patient Instructions (Addendum)
Plan A =automatic = symbicort 80 Take 2 puffs first thing in am and then another 2 puffs about 12 hours later.   Work on inhaler technique:  relax and gently blow all the way out then take a nice smooth deep breath back in, triggering the inhaler at same time you start breathing in.  Hold for up to 5 seconds if you can.  Rinse and gargle with water when done   Plan B  Only use your albuterol (proair) as a rescue medication to be used if you can't catch your breath by resting or doing a relaxed purse lip breathing pattern.  - The less you use it, the better it will work when you need it. - Ok to use up to 2 puffs  every 4 hours if you must but call for immediate appointment if use goes up over your usual need - Don't leave home without it !!  (think of it like the spare tire for your car)   Plan C = crisis =duoneb  Only use duoneb if use plan B and it doesn't work.  Please remember to go to the  x-ray department downstairs for your tests - we will call you with the results when they are available.  Gaol of 02 sats is to keep over 90% so keep wearing 02 every night and during the day as needed    Please schedule a follow up office visit in 4 weeks, sooner if needed with pfts on return

## 2014-03-31 NOTE — Progress Notes (Signed)
Subjective:    Patient ID: [name] [name], female    DOB: 30-Nov-1930,  MRN: [id number]    Brief patient profile:  84 yowf quit smoking 1980s s sequelae slowed somewhat by back / leg problems x 2012 with h/o sob x one flight x 2000 started Nov 2015 on sporadic symbicort /saba  then 1st of December 2015 onset of cough indolent but gradually worse and refractory to out pt rx so admitted:    Admit date: 02/01/2014 Discharge date: 02/07/2014  Recommendations for Outpatient Follow-up:  1. Pt will need to follow up with PCP in 2 weeks post discharge 2. Please obtain BMP and CBC in one week 3. Please maintain 2L nasal cannula and wean for oxygen sat >92%  Discharge Diagnoses:  HCAP (note there was no def as dz on cxr  On same day CT showed "patches") -Continued Zosyn (over 3 days)and transitioned to po levofloxacin without any difficulty -d/c vancomycin -Continue bronchodilators -Pulmonary hygiene - the patient will be discharged with Levofloxacin for 2 additional days which will complete 8 days of therapy Acute respiratory failure -Presently stable on 2L -secondary to HCAP -wean oxygen for sat >92% Atypical chest pain -Patient had normal heart catheterization on 01/03/2014 -EKG without any ST-T wave changes -CTA chest negative for pulmonary embolus Atrial Dysrthymia -Patient recently had a two-week event monitor which was negative for atrial fibrillation -Showed the patient had frequent PACs -Continue metoprolol succinate 50 mg twice a day Bacteremia-CoNS -contaminant -Discontinue vancomycin Diabetes mellitus type 2 -02/02/14--Hemoglobin A1c 7.5 -NovoLog sliding scale -The patient's gabapentin dose was decreased and adjusted for her renal function--she will go home with gabapentin 600 mg twice a day -Given improvement in the patient's renal function, the patient will be started back on her usual dose of metformin -The patient's renal function should be monitored regularly in  the outpatient setting -Serum creatinine 0.91 at time of discharge Hypertension -Continue metoprolol succinate -Hold losartan and HCTZ due to soft BP--Blood pressure has remained stable off of these agents -due to pt's aortic stenosis and diabetes mellitus, restart lower dose losartan 25 mg daily Hyperlipidemia -continue Crestor Deconditioning -PT eval -will need SNF      03/03/2014 1st [facility] office visit/ Melvyn Novas  / consult requested by Dr Leonides Schanz Chief Complaint  Patient presents with  . Pulmonary Consult    Referred by Dr. Theadore Nan. Pt states that her breathing is "fair".  She has had a little bit of wheezing.   walking s stopping x 8 min s 02 , cough persists but improved, minimal mucoid sputum esp in am  rec Plan A =automatic = symbicort 80 Take 2 puffs first thing in am and then another 2 puffs about 12 hours later.  Work on inhaler technique:  relax and gently blow all the way out then take a nice smooth deep breath back in, triggering the inhaler at same time you start breathing in.  Hold for up to 5 seconds if you can.  Rinse and gargle with water when done Plan B  Only use your albuterol (proair) as a rescue medication Plan C = crisis =duoneb  Only use duoneb if use plan B and it doesn't work. Use 02 2lpm with activity and at bedtime Add pepcid 20 mg one at bedtime GERD diet     03/31/2014 f/u ov/Rocco Kerkhoff re: acute resp failure/ presumed pna/ali  Chief Complaint  Patient presents with  . Follow-up    Breathing has improved some since the last visit.  She has not been using o2 with exertion, and sats on RA were in the 60's.   sats have been spurious due to nail polish  Overall much improved, now ambulatory whereas prev in w/c  No obvious day to day or daytime variabilty or assoc chronic cough or cp or chest tightness, subjective wheeze overt sinus or hb symptoms. No unusual exp hx or h/o childhood pna/ asthma or knowledge of premature birth.  Sleeping ok  without nocturnal  or early am exacerbation  of respiratory  c/o's or need for noct saba. Also denies any obvious fluctuation of symptoms with weather or environmental changes or other aggravating or alleviating factors except as outlined above   Current Medications, Allergies, Complete Past Medical History, Past Surgical History, Family History, and Social History were reviewed in [employer] record.  ROS  The following are not active complaints unless bolded sore throat, dysphagia, dental problems, itching, sneezing,  nasal congestion or excess/ purulent secretions, ear ache,   fever, chills, sweats, unintended wt loss, pleuritic or exertional cp, hemoptysis,  orthopnea pnd or leg swelling, presyncope, palpitations, heartburn, abdominal pain, anorexia, nausea, vomiting, diarrhea  or change in bowel or urinary habits, change in stools or urine, dysuria,hematuria,  rash, arthralgias, visual complaints, headache, numbness weakness or ataxia or problems with walking or coordination,  change in mood/affect or memory.                        Objective:   Physical Exam  Obese wf  Now able to get up on exam table with minimal assist  03/31/2014        225  Wt Readings from Last 3 Encounters:  03/03/14 100.699 kg (222 lb)  02/02/14 100.1 kg (220 lb 10.9 oz)  01/19/14 100.608 kg (221 lb 12.8 oz)    Vital signs reviewed  HEENT: nl dentition, turbinates, and orophanx. Nl external ear canals without cough reflex   NECK :  without JVD/Nodes/TM/ nl carotid upstrokes bilaterally   LUNGS: no acc muscle use, clear to A and P bilaterally without cough on insp or exp maneuvers   CV:  RRR  no s3 or murmur or increase in P2,    ABD:  soft and nontender with nl excursion in the supine position. No bruits or organomegaly, bowel sounds nl  MS:  warm without deformities, calf tenderness, cyanosis or clubbing  SKIN: warm and dry without lesions    NEURO:  alert, approp, no  deficits   Labs ordered/ reviewed   Lab Results  Component Value Date   WBC 8.4 02/06/2014   HGB 9.5* 02/06/2014   HCT 31.8* 02/06/2014   MCV 101.6* 02/06/2014   PLT 142* 02/06/2014       Chemistry      Component Value Date/Time   NA 141 02/06/2014 0604   K 4.1 02/06/2014 0604   CL 98 02/06/2014 0604   CO2 35* 02/06/2014 0604   BUN 9 02/06/2014 0604   CREATININE 0.91 02/06/2014 0604      Component Value Date/Time   CALCIUM 8.6 02/06/2014 0604   ALKPHOS 84 01/10/2014 0703   AST 21 01/10/2014 0703   ALT 11 01/10/2014 0703   BILITOT 0.4 01/10/2014 0703       Lab Results  Component Value Date   TSH 0.99 01/02/2014     BNP < 100 02/01/14      I personally reviewed images and agree with radiology impression as follows:  CXR:  03/31/14 No acute cardiopulmonary abnormality.            Assessment & Plan:

## 2014-04-01 ENCOUNTER — Encounter: Payer: Self-pay | Admitting: Internal Medicine

## 2014-04-01 NOTE — Assessment & Plan Note (Signed)
Improving on rx as uacs vs cough variant asthma  I had an extended discussion with the patient reviewing all relevant studies completed to date and  lasting 15 to 20 minutes of a 25 minute visit on the following ongoing concerns:  1) Each maintenance medication was reviewed in detail including most importantly the difference between maintenance and as needed and under what circumstances the prns are to be used.  Please see instructions for details which were reviewed in writing and the patient given a copy.

## 2014-04-01 NOTE — Assessment & Plan Note (Signed)
See CT chest 02/01/14  > cxr nl 03/31/14   In view of her marked serial  improvement p rx as pna would not pursue f/u CT's at this point > this is clearly not an aggressive form of atypical TB or lung ca or ILD.

## 2014-04-01 NOTE — Assessment & Plan Note (Signed)
03/31/2014   Walked RA x one lap @ 185 stopped due to fatigue, minimal sob, slow to nl pace, no desat    Clearly improved, needs to return for pfts but not need for 02 except at hs

## 2014-04-10 ENCOUNTER — Telehealth: Payer: Self-pay | Admitting: Internal Medicine

## 2014-04-10 NOTE — Telephone Encounter (Signed)
Spoke with the pt She states that she went to [facility] on 05/08/14 and was out on the pollen and forgot her albuterol and had a hard time breathing  She states had to go to ED at [facility] and get  Neb tx  She is improved back to her normal baseline now  I advised her to be sure and keep her albuterol with her when she goes out for instances like these  She verbalized understanding and will keep appt with MW with PFT for 05/12/14 and call sooner if needed  Will forward to MW to make him aware

## 2014-04-12 ENCOUNTER — Other Ambulatory Visit: Payer: Self-pay | Admitting: Internal Medicine

## 2014-04-12 MED ORDER — BUDESONIDE-FORMOTEROL FUMARATE 80-4.5 MCG/ACT IN AERO
2.0000 | INHALATION_SPRAY | Freq: Two times a day (BID) | RESPIRATORY_TRACT | Status: DC
Start: 2014-04-12 — End: 2014-09-15

## 2014-04-17 ENCOUNTER — Ambulatory Visit (INDEPENDENT_AMBULATORY_CARE_PROVIDER_SITE_OTHER): Payer: Medicare HMO | Admitting: Adult Health

## 2014-04-17 ENCOUNTER — Telehealth: Payer: Self-pay | Admitting: Internal Medicine

## 2014-04-17 ENCOUNTER — Encounter: Payer: Self-pay | Admitting: Adult Health

## 2014-04-17 VITALS — BP 126/66 | HR 56 | Temp 98.5°F | Ht 63.0 in | Wt 221.0 lb

## 2014-04-17 DIAGNOSIS — J209 Acute bronchitis, unspecified: Secondary | ICD-10-CM | POA: Diagnosis not present

## 2014-04-17 MED ORDER — AMOXICILLIN-POT CLAVULANATE 875-125 MG PO TABS: 1.0000 | ORAL_TABLET | Freq: Two times a day (BID) | ORAL | Status: AC

## 2014-04-17 NOTE — Patient Instructions (Signed)
Aumgentin 875mg  Twice daily  For 7 days  Mucinex DM Twice daily  As needed  Cough/congestion Fluids and rest  Follow up Dr. Melvyn Novas  In 2-3  Weeks as planned  and As needed   Please contact office for sooner follow up if symptoms do not improve or worsen or seek emergency care

## 2014-04-17 NOTE — Progress Notes (Signed)
Subjective:    Patient ID: [name] [name], female    DOB: Oct 14, 1930,  MRN: [id number]    Brief patient profile:  84 yowf quit smoking 1980s s sequelae slowed somewhat by back / leg problems x 2012 with h/o sob x one flight x 2000 started Nov 2015 on sporadic symbicort /saba  then 1st of December 2015 onset of cough indolent but gradually worse and refractory to out pt rx so admitted:    Admit date: 02/01/2014 Discharge date: 02/07/2014  Recommendations for Outpatient Follow-up:  1. Pt will need to follow up with PCP in 2 weeks post discharge 2. Please obtain BMP and CBC in one week 3. Please maintain 2L nasal cannula and wean for oxygen sat >92%  Discharge Diagnoses:  HCAP (note there was no def as dz on cxr  On same day CT showed "patches") -Continued Zosyn (over 3 days)and transitioned to po levofloxacin without any difficulty -d/c vancomycin -Continue bronchodilators -Pulmonary hygiene - the patient will be discharged with Levofloxacin for 2 additional days which will complete 8 days of therapy Acute respiratory failure -Presently stable on 2L -secondary to HCAP -wean oxygen for sat >92% Atypical chest pain -Patient had normal heart catheterization on 01/03/2014 -EKG without any ST-T wave changes -CTA chest negative for pulmonary embolus Atrial Dysrthymia -Patient recently had a two-week event monitor which was negative for atrial fibrillation -Showed the patient had frequent PACs -Continue metoprolol succinate 50 mg twice a day Bacteremia-CoNS -contaminant -Discontinue vancomycin Diabetes mellitus type 2 -02/02/14--Hemoglobin A1c 7.5 -NovoLog sliding scale -The patient's gabapentin dose was decreased and adjusted for her renal function--she will go home with gabapentin 600 mg twice a day -Given improvement in the patient's renal function, the patient will be started back on her usual dose of metformin -The patient's renal function should be monitored regularly in  the outpatient setting -Serum creatinine 0.91 at time of discharge Hypertension -Continue metoprolol succinate -Hold losartan and HCTZ due to soft BP--Blood pressure has remained stable off of these agents -due to pt's aortic stenosis and diabetes mellitus, restart lower dose losartan 25 mg daily Hyperlipidemia -continue Crestor Deconditioning -PT eval -will need SNF      03/03/2014 1st [facility] visit/ Melvyn Novas  / consult requested by Dr Leonides Schanz Chief Complaint  Patient presents with  . Pulmonary Consult    Referred by Dr. Theadore Nan. Pt states that her breathing is "fair".  She has had a little bit of wheezing.   walking s stopping x 8 min s 02 , cough persists but improved, minimal mucoid sputum esp in am  rec Plan A =automatic = symbicort 80 Take 2 puffs first thing in am and then another 2 puffs about 12 hours later.  Work on inhaler technique:  relax and gently blow all the way out then take a nice smooth deep breath back in, triggering the inhaler at same time you start breathing in.  Hold for up to 5 seconds if you can.  Rinse and gargle with water when done Plan B  Only use your albuterol (proair) as a rescue medication Plan C = crisis =duoneb  Only use duoneb if use plan B and it doesn't work. Use 02 2lpm with activity and at bedtime Add pepcid 20 mg one at bedtime GERD diet     03/31/2014 f/u ov/Wert re: acute resp failure/ presumed pna/ali  Chief Complaint  Patient presents with  . Follow-up    Breathing has improved some since the last visit.  She has not been using o2 with exertion, and sats on RA were in the 60's.   sats have been spurious due to nail polish  Overall much improved, now ambulatory whereas prev in w/c >>no changes   04/17/2014 Acute OV  Complains of increased cough and congestion with yellow mucus, wheezing, tightness in chest x 4 days.   Denies f/c/s, n/v/d, hemoptysis, dyspnea.  Says was doing okay until about 1 week ago, feels  pollen caused her symptoms.  Now coughing up thick yellow mucus.  No fever, chest pain, orthopnea, edema or fever.     Current Medications, Allergies, Complete Past Medical History, Past Surgical History, Family History, and Social History were reviewed in [employer] record.  ROS  The following are not active complaints unless bolded sore throat, dysphagia, dental problems, itching, sneezing,  nasal congestion or excess/ purulent secretions, ear ache,   fever, chills, sweats, unintended wt loss, pleuritic or exertional cp, hemoptysis,  orthopnea pnd or leg swelling, presyncope, palpitations, heartburn, abdominal pain, anorexia, nausea, vomiting, diarrhea  or change in bowel or urinary habits, change in stools or urine, dysuria,hematuria,  rash, arthralgias, visual complaints, headache, numbness weakness or ataxia or problems with walking or coordination,  change in mood/affect or memory.                        Objective:   Physical Exam  Obese wf , barking cough   03/31/2014        225>221 04/17/2014    Vital signs reviewed  HEENT: nl dentition, turbinates, and orophanx. Nl external ear canals without cough reflex   NECK :  without JVD/Nodes/TM/ nl carotid upstrokes bilaterally   LUNGS: no acc muscle use, clear to A and P bilaterally without cough on insp or exp maneuvers   CV:  RRR  no s3 or murmur or increase in P2,    ABD:  soft and nontender with nl excursion in the supine position. No bruits or organomegaly, bowel sounds nl  MS:  warm without deformities, calf tenderness, cyanosis or clubbing  SKIN: warm and dry without lesions    NEURO:  alert, approp, no deficits   Labs ordered/ reviewed   Lab Results  Component Value Date   WBC 8.4 02/06/2014   HGB 9.5* 02/06/2014   HCT 31.8* 02/06/2014   MCV 101.6* 02/06/2014   PLT 142* 02/06/2014       Chemistry      Component Value Date/Time   NA 141 02/06/2014 0604   K 4.1 02/06/2014 0604     CL 98 02/06/2014 0604   CO2 35* 02/06/2014 0604   BUN 9 02/06/2014 0604   CREATININE 0.91 02/06/2014 0604      Component Value Date/Time   CALCIUM 8.6 02/06/2014 0604   ALKPHOS 84 01/10/2014 0703   AST 21 01/10/2014 0703   ALT 11 01/10/2014 0703   BILITOT 0.4 01/10/2014 0703       Lab Results  Component Value Date   TSH 0.99 01/02/2014     BNP < 100 02/01/14      I personally reviewed images and agree with radiology impression as follows:  CXR:  03/31/14 No acute cardiopulmonary abnormality.            Assessment & Plan:

## 2014-04-17 NOTE — Assessment & Plan Note (Signed)
Flare   Plan  Aumgentin 875mg  Twice daily  For 7 days  Mucinex DM Twice daily  As needed  Cough/congestion Fluids and rest  Follow up Dr. Melvyn Novas  In 2-3  Weeks as planned  and As needed   Please contact office for sooner follow up if symptoms do not improve or worsen or seek emergency care

## 2014-04-17 NOTE — Telephone Encounter (Signed)
Pt states she is having increased cough-productive yellow in color and not feeling well overall. Pt has been scheduled to see TP today at 12 noon. Nothing more needed at this time.

## 2014-04-21 ENCOUNTER — Telehealth: Payer: Self-pay | Admitting: Adult Health

## 2014-04-21 MED ORDER — PREDNISONE 10 MG PO TABS: ORAL_TABLET | ORAL | Status: DC

## 2014-04-21 NOTE — Telephone Encounter (Signed)
Return call can be reached @ [phone].Hillery Hunter

## 2014-04-21 NOTE — Telephone Encounter (Signed)
Called and spoke to pt. Informed pt of the recs per TP. Rx sent to pharmacy (rx instructions verified with TP). Pt verbalized understanding and denied any further questions or concerns at this time.

## 2014-04-21 NOTE — Telephone Encounter (Signed)
Can try prednisone taper 10mg  #20 2 day taper.  If not improving will need ov sooner Recent cxr 3 weeks with no acute process. Please contact office for sooner follow up if symptoms do not improve or worsen or seek emergency care

## 2014-04-21 NOTE — Telephone Encounter (Signed)
Called Mickel Baas at number listed and patient answered the phone - pt states that Mickel Baas has already gone for the day.  Pt states that she is having some increased coughing, with yellow mucus. Denies fever and denies SOB. Pt given Augmenting 7 day course on 04/17/14. Reports that she has about 4-5 days left on the abx and still feels terrible. Pt states that Mickel Baas told her she feels she needs a CXR since her symptoms are not getting any better and her "lungs sound terrible." Pt states that she is not sure if she needs another appt today but atleast maybe a cxr on Monday?  Please advise TP. Thanks.  Allergies  Allergen Reactions  . Codeine Nausea And Vomiting  . Demerol Other (See Comments)    Drunk feeling"  . Doxycycline Nausea And Vomiting  . Statins Other (See Comments)    "hurt"   . Zetia [Ezetimibe] Other (See Comments)    hurt

## 2014-04-22 ENCOUNTER — Other Ambulatory Visit: Payer: Self-pay | Admitting: Nurse Practitioner

## 2014-05-04 ENCOUNTER — Encounter: Payer: Self-pay | Admitting: Cardiology

## 2014-05-04 ENCOUNTER — Ambulatory Visit (INDEPENDENT_AMBULATORY_CARE_PROVIDER_SITE_OTHER): Payer: Medicare HMO | Admitting: Cardiology

## 2014-05-04 VITALS — BP 142/60 | HR 70 | Ht 65.0 in | Wt 225.0 lb

## 2014-05-04 DIAGNOSIS — I350 Nonrheumatic aortic (valve) stenosis: Secondary | ICD-10-CM | POA: Diagnosis not present

## 2014-05-04 DIAGNOSIS — I491 Atrial premature depolarization: Secondary | ICD-10-CM | POA: Diagnosis not present

## 2014-05-04 DIAGNOSIS — I119 Hypertensive heart disease without heart failure: Secondary | ICD-10-CM

## 2014-05-04 NOTE — Progress Notes (Signed)
Cardiology Office Note   Date:  05/04/2014   ID:  [name] [name], DOB [birth year], MRN [id number]  PCP:  Cari Caraway, MD  Cardiologist:   Darlin Coco, MD   No chief complaint on file.     History of Present Illness: [name] [name] is a 81 y.o. female who presents for a six-month follow-up office visit  This pleasant 81 year old woman is seen for a post hospital office visit. She was hospitalized in December 2015 with suspected crescendo angina pectoris. She underwent cardiac catheterization on 01/03/14 by Dr. Quay Burow. The cardiac catheterization showed normal coronary arteries. Her ejection fraction was 60% and there were no segmental wall motion abnormalities.   Her last echocardiogram was 10/18/10 and showed an ejection fraction of 60-65% with mild aortic stenosis with peak gradient of 22 and mean gradient of 11. The patient has a past history of premature atrial beats. She previously was seen in the recovery room following surgery at [facility] where her not to was initially felt to show new atrial fibrillation but on careful review she was having sinus rhythm with frequent premature atrial contractions. These are asymptomatic. The patient has not been experiencing any chest pain or shortness of breath. Her EKG today again shows frequent PACs and runs of PACs but no atrial fibrillation. One week after her cardiac catheterization she was rehospitalized for shortness of breath and acute bronchitis from December 28 until December 31.  Past Medical History  Diagnosis Date  . Mild aortic stenosis     a. 10/2010 Echo: Ef 60-65%, no rwma, mild LVH no AS.  Marland Kitchen Chronic back pain   . Peripheral neuropathy   . GERD (gastroesophageal reflux disease)   . Hypertension   . History of pneumonia   . History of bronchitis   . Basal cell carcinoma     a. 2013.  . Non-cardiac chest pain     LHC w/ nl coronaries and nl EF 60% -12/2013  . PAC (premature atrial  contraction)   . Heart murmur   . Type II diabetes mellitus     TYPE 2    Past Surgical History  Procedure Laterality Date  . Vesicovaginal fistula closure w/ tah  1964  . US echocardiography  04-26-09    EF 55-60%  . Cardiovascular stress test  05-13-2005    EF 67%  . Knee surgery    . Joint replacement  2000    left knee  . Abdominal hysterectomy  1964  . Toe shortened      right 2nd toe  . Cataract extraction      both eyes  . Mass excision  01/13/2012    Procedure: EXCISION MASS;  Surgeon: Ralene Ok, MD;  Location: [facility] ORS;  Service: General;  Laterality: Right;  Excision of Right Back Mass  . Left heart catheterization with coronary angiogram N/A 01/03/2014    Procedure: LEFT HEART CATHETERIZATION WITH CORONARY ANGIOGRAM;  Surgeon: Lorretta Harp, MD;  Location: [facility] CATH LAB;  Service: Cardiovascular;  Laterality: N/A;  . Cardiac catheterization    . Eye surgery      cataract     Current Outpatient Prescriptions  Medication Sig Dispense Refill  . albuterol (PROVENTIL HFA;VENTOLIN HFA) 108 (90 BASE) MCG/ACT inhaler Inhale 1 puff into the lungs every 6 (six) hours as needed for wheezing or shortness of breath.    Marland Kitchen aspirin 81 MG tablet Take 81 mg by mouth every Monday, Wednesday, and Friday. Take 1 on  Monday, Wednesday, and Friday only    . BIOTIN PO Take 1 tablet by mouth daily.    . budesonide-formoterol (SYMBICORT) 80-4.5 MCG/ACT inhaler Inhale 2 puffs into the lungs 2 (two) times daily. 1 Inhaler 11  . calcium citrate (CALCITRATE - DOSED IN MG ELEMENTAL CALCIUM) 950 MG tablet Take 200 mg of elemental calcium by mouth daily.    . cholecalciferol (VITAMIN D) 1000 UNITS tablet Take 1,000 Units by mouth daily.    . famotidine (PEPCID) 20 MG tablet Take 20 mg by mouth daily.    . fluticasone (FLONASE) 50 MCG/ACT nasal spray Place 2 sprays into both nostrils daily. 16 g 0  . furosemide (LASIX) 20 MG tablet Take 20 mg by mouth as needed for fluid (Left foot swelling).      . gabapentin (NEURONTIN) 300 MG capsule Take 2 capsules (600 mg total) by mouth 2 (two) times daily. 120 capsule 0  . glimepiride (AMARYL) 2 MG tablet Take 2 mg by mouth daily with breakfast.    . HYDROcodone-acetaminophen (NORCO/VICODIN) 5-325 MG per tablet Take 1-2 tablets by mouth daily as needed for moderate pain. 30 tablet 0  . ipratropium-albuterol (DUONEB) 0.5-2.5 (3) MG/3ML SOLN Take 3 mLs by nebulization at bedtime.    Marland Kitchen losartan (COZAAR) 50 MG tablet Take 50 mg by mouth 2 (two) times daily. 1/2 tablet by mouth daily    . metFORMIN (GLUCOPHAGE) 1000 MG tablet Take 1 tablet (1,000 mg total) by mouth 2 (two) times daily with a meal.    . metoprolol succinate (TOPROL-XL) 50 MG 24 hr tablet Take 1 tablet (50 mg total) by mouth 2 (two) times daily. 180 tablet 3  . nitroGLYCERIN (NITROSTAT) 0.4 MG SL tablet Place 1 tablet (0.4 mg total) under the tongue every 5 (five) minutes as needed for chest pain. 25 tablet 6  . pantoprazole (PROTONIX) 40 MG tablet Take 40 mg by mouth every morning.     . rosuvastatin (CRESTOR) 5 MG tablet Take 5 mg by mouth at bedtime.    Marland Kitchen Spacer/Aero-Holding Chambers (AEROCHAMBER MV) inhaler Use as instructed 1 each 0  . triamcinolone (KENALOG) 0.025 % cream Apply 1 application topically as needed (as needed for leg pain).      No current facility-administered medications for this visit.    Allergies:   Codeine; Demerol; Doxycycline; Statins; and Zetia    Social History:  The patient  reports that she quit smoking about 34 years ago. Her smoking use included Cigarettes. She has a 2.5 pack-year smoking history. She has never used smokeless tobacco. She reports that she does not drink alcohol or use illicit drugs.   Family History:  The patient's family history includes COPD in her father; Heart failure in her father; Irregular heart beat in her sister; Other in her mother.    ROS:  Please see the history of present illness.   Otherwise, review of systems are  positive for none.   All other systems are reviewed and negative.    PHYSICAL EXAM: VS:  BP 142/60 mmHg  Pulse 70  Ht 5\' 5"  (1.651 m)  Wt 225 lb (102.059 kg)  BMI 37.44 kg/m2 , BMI Body mass index is 37.44 kg/(m^2). GEN: Well nourished, well developed, in no acute distress HEENT: normal Neck: no JVD, carotid bruits, or masses Cardiac: RRR; no , rubs, or gallops,no edema .  There is a grade 2/6 systolic ejection murmur at the base. Respiratory:  clear to auscultation bilaterally, normal work of breathing GI: soft,  nontender, nondistended, + BS MS: no deformity or atrophy Skin: warm and dry, no rash Neuro:  Strength and sensation are intact Psych: euthymic mood, full affect   EKG:  EKG is ordered today. The ekg ordered today demonstrates normal sinus rhythm with frequent PACs   Recent Labs: 01/02/2014: TSH 0.99 01/03/2014: Magnesium 1.5 01/10/2014: ALT 11 02/01/2014: B Natriuretic Peptide 85.8 02/06/2014: BUN 9; Creatinine 0.91; Hemoglobin 9.5*; Platelets 142*; Potassium 4.1; Sodium 141    Lipid Panel No results found for: CHOL, TRIG, HDL, CHOLHDL, VLDL, LDLCALC, LDLDIRECT    Wt Readings from Last 3 Encounters:  05/04/14 225 lb (102.059 kg)  04/17/14 221 lb (100.245 kg)  03/31/14 225 lb (102.059 kg)         ASSESSMENT AND PLAN: 1.Atypical chest pain with normal cath 01/03/14 2. Mild aortic stenosis.  Last echocardiogram was in 2012 3. Diabetes mellitus 4. Benign hypertensive heart disease without heart failure 5. Osteoarthritis. Walks with a cane. 6. Frequent premature atrial beats. She does not have any documented history of atrial fibrillation.  7. High stress level secondary to her husband's dementia problems. 8.  Pulmonary problems being evaluated by Dr. Melvyn Novas  Disposition: Continue same medication.  Recheck in 6 months for office visit and EKG.  Consider follow-up echocardiogram for aortic valve after next visit   Current medicines are reviewed at  length with the patient today.  The patient does not have concerns regarding medicines.  The following changes have been made:  no change  Labs/ tests ordered today include:   Orders Placed This Encounter  Procedures  . EKG 12-Lead       Signed, Darlin Coco, MD  05/04/2014 5:37 PM    [facility] [address] [address], [place], [place]  [address] Phone: [phone]; Fax: [phone]

## 2014-05-04 NOTE — Patient Instructions (Signed)
Medication Instructions:  Your physician recommends that you continue on your current medications as directed. Please refer to the Current Medication list given to you today.  Labwork: none  Testing/Procedures: none  Follow-Up: Your physician wants you to follow-up in: 6 month ov/ekg  You will receive a reminder letter in the mail two months in advance. If you don't receive a letter, please call our office to schedule the follow-up appointment.     

## 2014-05-12 ENCOUNTER — Ambulatory Visit (INDEPENDENT_AMBULATORY_CARE_PROVIDER_SITE_OTHER): Payer: Medicare HMO | Admitting: Internal Medicine

## 2014-05-12 ENCOUNTER — Encounter: Payer: Self-pay | Admitting: Internal Medicine

## 2014-05-12 VITALS — BP 122/60 | HR 75 | Ht 63.5 in | Wt 225.0 lb

## 2014-05-12 DIAGNOSIS — J449 Chronic obstructive pulmonary disease, unspecified: Secondary | ICD-10-CM | POA: Diagnosis not present

## 2014-05-12 DIAGNOSIS — J9612 Chronic respiratory failure with hypercapnia: Secondary | ICD-10-CM | POA: Diagnosis not present

## 2014-05-12 DIAGNOSIS — R0602 Shortness of breath: Secondary | ICD-10-CM

## 2014-05-12 LAB — PULMONARY FUNCTION TEST
DL/VA % PRED: 90 %
DL/VA: 4.3 ml/min/mmHg/L
DLCO UNC % PRED: 64 %
DLCO unc: 15.1 ml/min/mmHg
FEF 25-75 PRE: 0.53 L/s
FEF 25-75 Post: 0.63 L/sec
FEF2575-%Change-Post: 17 %
FEF2575-%PRED-POST: 49 %
FEF2575-%Pred-Pre: 42 %
FEV1-%CHANGE-POST: 7 %
FEV1-%PRED-PRE: 49 %
FEV1-%Pred-Post: 52 %
FEV1-PRE: 0.89 L
FEV1-Post: 0.96 L
FEV1FVC-%Change-Post: 4 %
FEV1FVC-%Pred-Pre: 89 %
FEV6-%CHANGE-POST: 2 %
FEV6-%PRED-POST: 60 %
FEV6-%Pred-Pre: 58 %
FEV6-POST: 1.4 L
FEV6-Pre: 1.36 L
FEV6FVC-%Change-Post: 0 %
FEV6FVC-%Pred-Post: 106 %
FEV6FVC-%Pred-Pre: 106 %
FVC-%Change-Post: 2 %
FVC-%Pred-Post: 56 %
FVC-%Pred-Pre: 55 %
FVC-Post: 1.4 L
FVC-Pre: 1.36 L
PRE FEV6/FVC RATIO: 100 %
Post FEV1/FVC ratio: 69 %
Post FEV6/FVC ratio: 100 %
Pre FEV1/FVC ratio: 66 %
RV % PRED: 90 %
RV: 2.19 L
TLC % PRED: 78 %
TLC: 3.89 L

## 2014-05-12 NOTE — Progress Notes (Signed)
Subjective:    Patient ID: [name] [name], female    DOB: May 30, 1930,  MRN: [id number]    Brief patient profile:  84 yowf quit smoking 1980s s sequelae slowed somewhat by back / leg problems x 2012 with h/o sob x one flight x 2000  >> Rx Nov 2015 on sporadic symbicort /saba  then 1st of December 2015 onset of cough indolent but gradually worse and refractory to out pt rx so seen after admit in pulmonary clinic 03/03/14 with pfts showing GOLD II criteria for copd 4/291/6   Admit date: 02/01/2014 Discharge date: 02/07/2014  Discharge Diagnoses:  HCAP (note there was no def as dz on cxr  On same day CT showed "patches") -Continued Zosyn (over 3 days)and transitioned to po levofloxacin without any difficulty -d/c vancomycin -Continue bronchodilators -Pulmonary hygiene - the patient will be discharged with Levofloxacin for 2 additional days which will complete 8 days of therapy Acute respiratory failure -Presently stable on 2L -secondary to HCAP -wean oxygen for sat >92% Atypical chest pain -Patient had normal heart catheterization on 01/03/2014 -EKG without any ST-T wave changes -CTA chest negative for pulmonary embolus Atrial Dysrthymia -Patient recently had a two-week event monitor which was negative for atrial fibrillation -Showed the patient had frequent PACs -Continue metoprolol succinate 50 mg twice a day Bacteremia-CoNS -contaminant -Discontinue vancomycin Diabetes mellitus type 2 -02/02/14--Hemoglobin A1c 7.5 -NovoLog sliding scale -The patient's gabapentin dose was decreased and adjusted for her renal function--she will go home with gabapentin 600 mg twice a day -Given improvement in the patient's renal function, the patient will be started back on her usual dose of metformin -The patient's renal function should be monitored regularly in the outpatient setting -Serum creatinine 0.91 at time of discharge Hypertension -Continue metoprolol succinate -Hold losartan and HCTZ  due to soft BP--Blood pressure has remained stable off of these agents -due to pt's aortic stenosis and diabetes mellitus, restart lower dose losartan 25 mg daily Hyperlipidemia -continue Crestor Deconditioning -PT eval -will need SNF      03/03/2014 1st [facility] office visit/ Melvyn Novas  / consult requested by Dr Leonides Schanz Chief Complaint  Patient presents with  . Pulmonary Consult    Referred by Dr. Theadore Nan. Pt states that her breathing is "fair".  She has had a little bit of wheezing.   walking s stopping x 8 min s 02 , cough persists but improved, minimal mucoid sputum esp in am  rec Plan A =automatic = symbicort 80 Take 2 puffs first thing in am and then another 2 puffs about 12 hours later.  Work on inhaler technique:  relax and gently blow all the way out then take a nice smooth deep breath back in, triggering the inhaler at same time you start breathing in.  Hold for up to 5 seconds if you can.  Rinse and gargle with water when done Plan B  Only use your albuterol (proair) as a rescue medication Plan C = crisis =duoneb  Only use duoneb if use plan B and it doesn't work. Use 02 2lpm with activity and at bedtime Add pepcid 20 mg one at bedtime GERD diet     03/31/2014 f/u ov/Ziyan Hillmer re: acute resp failure/ presumed pna/ali  Chief Complaint  Patient presents with  . Follow-up    Breathing has improved some since the last visit. She has not been using o2 with exertion, and sats on RA were in the 60's.   sats have been spurious due to nail  polish  Overall much improved, now ambulatory whereas prev in w/c >>no changes   04/07/14 > seen at [facility] > responded to nebs   04/17/2014 Acute OV  Complains of increased cough and congestion with yellow mucus, wheezing, tightness in chest x 4 days.   Denies f/c/s, n/v/d, hemoptysis, dyspnea.  Says was doing okay until about 1 week prior to OV  , feels pollen caused her symptoms.  rec Aumgentin 875mg  Twice daily  For 7 days    Mucinex DM Twice daily  As needed  Cough/congestion    05/12/2014 f/u ov/Micah Galeno re: mild asthma Chief Complaint  Patient presents with  . Follow-up    Pt had PFT performed today. She still has sob with exertion and when bending down. Pt denies cough chest tightness and or wheezing.  Overall doing much better though having trouble following instructions on how/when  to use resp rx  Only sob with activities above nl adl, like if she gets in a hurry or climbs steps [facility] = 1)   No obvious day to day or daytime variabilty or assoc chronic cough or cp or chest tightness, subjective wheeze overt sinus or hb symptoms. No unusual exp hx or h/o childhood pna/ asthma or knowledge of premature birth.  Sleeping ok without nocturnal  or early am exacerbation  of respiratory  c/o's or need for noct saba. Also denies any obvious fluctuation of symptoms with weather or environmental changes or other aggravating or alleviating factors except as outlined above   Current Medications, Allergies, Complete Past Medical History, Past Surgical History, Family History, and Social History were reviewed in [employer] record.  ROS  The following are not active complaints unless bolded sore throat, dysphagia, dental problems, itching, sneezing,  nasal congestion or excess/ purulent secretions, ear ache,   fever, chills, sweats, unintended wt loss, pleuritic or exertional cp, hemoptysis,  orthopnea pnd or leg swelling, presyncope, palpitations, heartburn, abdominal pain, anorexia, nausea, vomiting, diarrhea  or change in bowel or urinary habits, change in stools or urine, dysuria,hematuria,  rash, arthralgias, visual complaints, headache, numbness weakness or ataxia or problems with walking or coordination,  change in mood/affect or memory.                                    Objective:   Physical Exam  Obese amb wf nad   Wt Readings from Last 3 Encounters:  05/12/14 225 lb (102.059  kg)  05/04/14 225 lb (102.059 kg)  04/17/14 221 lb (100.245 kg)    Vital signs reviewed     HEENT: nl dentition, turbinates, and orophanx. Nl external ear canals without cough reflex   NECK :  without JVD/Nodes/TM/ nl carotid upstrokes bilaterally   LUNGS: no acc muscle use, clear to A and P bilaterally without cough on insp or exp maneuvers   CV:  RRR  no s3 or murmur or increase in P2,    ABD:  soft and nontender with nl excursion in the supine position. No bruits or organomegaly, bowel sounds nl  MS:  warm without deformities, calf tenderness, cyanosis or clubbing  SKIN: warm and dry without lesions    NEURO:  alert, approp, no deficits        I personally reviewed images and agree with radiology impression as follows:  CXR:  03/31/14 No acute cardiopulmonary abnormality.  Assessment & Plan:

## 2014-05-12 NOTE — Progress Notes (Signed)
PFT done today. 

## 2014-05-12 NOTE — Patient Instructions (Addendum)
Plan A =automatic = symbicort 80 Take 2 puffs first thing in am and then another 2 puffs about 12 hours later.   Work on inhaler technique:  relax and gently blow all the way out then take a nice smooth deep breath back in, triggering the inhaler at same time you start breathing in.  Hold for up to 5 seconds if you can.  Rinse and gargle with water when done   Plan B  Only use your albuterol (proair) as a rescue medication to be used if you can't catch your breath by resting or doing a relaxed purse lip breathing pattern.  - The less you use it, the better it will work when you need it. - Ok to use up to 2 puffs  every 4 hours if you must but call for immediate appointment if use goes up over your usual need - Don't leave home without it !!  (think of it like the spare tire for your car)   Plan C = crisis =duoneb  Only use duoneb if use plan B and it doesn't work  Please see patient coordinator before you leave today  to schedule overnight 02 on room air    Please schedule a follow up visit in 3 months but call sooner if needed

## 2014-05-13 ENCOUNTER — Encounter: Payer: Self-pay | Admitting: Internal Medicine

## 2014-05-13 DIAGNOSIS — J449 Chronic obstructive pulmonary disease, unspecified: Secondary | ICD-10-CM | POA: Insufficient documentation

## 2014-05-13 NOTE — Assessment & Plan Note (Signed)
Says not using 02 any more and note does not desat at rest or walking here in clinic so needs ono to see if still needs noct 02 and if not then d/c

## 2014-05-13 NOTE — Assessment & Plan Note (Signed)
-   03/31/2014   Walked RA x one lap @ 185 stopped due to fatigue, minimal sob, slow to nl pace, no desat   - 05/12/14  PFTs  FEV1  0.96 (52%) ratio 69 with no reversibility, mild air trapping and dlco 64 corrects to 90     The proper method of use, as well as anticipated side effects, of a metered-dose inhaler are discussed and demonstrated to the patient. Improved effectiveness after extensive coaching during this visit to a level of approximately  75% and needs more work   I had an extended discussion with the patient reviewing all relevant studies completed to date and  lasting 15 to 20 minutes of a 25 minute visit on the following ongoing concerns:  1) she only has a mild airway problem by pfts which must be at least partially though not fully reversible by her recent h/o flares and would benefit from perfectly regular rx with laba/ ics but needs to understand how/ when to use saba  2) Each maintenance medication was reviewed in detail including most importantly the difference between maintenance and as needed and under what circumstances the prns are to be used.  Please see instructions for details which were reviewed in writing and the patient given a copy.

## 2014-05-18 ENCOUNTER — Other Ambulatory Visit (HOSPITAL_BASED_OUTPATIENT_CLINIC_OR_DEPARTMENT_OTHER): Payer: Self-pay | Admitting: Family Medicine

## 2014-05-18 ENCOUNTER — Ambulatory Visit (HOSPITAL_BASED_OUTPATIENT_CLINIC_OR_DEPARTMENT_OTHER)
Admission: RE | Admit: 2014-05-18 | Discharge: 2014-05-18 | Disposition: A | Discharge status: Home / Self Care | Payer: Medicare HMO | Source: Ambulatory Visit | Attending: Family Medicine | Admitting: Family Medicine

## 2014-05-18 DIAGNOSIS — R609 Edema, unspecified: Secondary | ICD-10-CM

## 2014-05-18 DIAGNOSIS — R600 Localized edema: Secondary | ICD-10-CM | POA: Diagnosis not present

## 2014-05-22 ENCOUNTER — Ambulatory Visit: Payer: Medicare HMO | Admitting: Physical Medicine & Rehabilitation

## 2014-05-25 ENCOUNTER — Encounter: Payer: Medicare HMO | Attending: Physical Medicine & Rehabilitation

## 2014-05-25 ENCOUNTER — Ambulatory Visit (HOSPITAL_BASED_OUTPATIENT_CLINIC_OR_DEPARTMENT_OTHER): Payer: Medicare HMO | Admitting: Physical Medicine & Rehabilitation

## 2014-05-25 ENCOUNTER — Encounter: Payer: Self-pay | Admitting: Physical Medicine & Rehabilitation

## 2014-05-25 VITALS — BP 150/60 | HR 72 | Resp 14

## 2014-05-25 DIAGNOSIS — M545 Low back pain, unspecified: Secondary | ICD-10-CM | POA: Insufficient documentation

## 2014-05-25 DIAGNOSIS — M47817 Spondylosis without myelopathy or radiculopathy, lumbosacral region: Secondary | ICD-10-CM | POA: Diagnosis not present

## 2014-05-25 DIAGNOSIS — G8929 Other chronic pain: Secondary | ICD-10-CM | POA: Diagnosis not present

## 2014-05-25 MED ORDER — TRAMADOL HCL 50 MG PO TABS: 100.0000 mg | ORAL_TABLET | Freq: Every day | ORAL | Status: DC | PRN

## 2014-05-25 NOTE — Patient Instructions (Signed)
If tramadol not helpful , please call for appt to go back on hydrocodone

## 2014-05-25 NOTE — Progress Notes (Signed)
Subjective:    Patient ID: [name] [name], female    DOB: 02-17-1930, 81 y.o.   MRN: [id number]  HPI 81 year old female with lumbar spondylosis who follows up for her chronic low back pain. As noted in previous visit, she was hospitalized for pneumonia a couple months ago and ended up going to a skilled nursing facility because she was severely deconditioning. She is getting back to her usual activities. She like to garden some more this spring. She has followed up with pulmonology. Her low back pain is overall doing okay however with her increased activity it is flared up a bit. She had a couple hydrocodone 5 mg tablets left and she has finished these. We have discussed other treatment alternatives she is allergic to codeine but she has never tried tramadol. She would like something that has less side effects than hydrocodone and does not require monthly visits. Pain Inventory Average Pain 7 Pain Right Now 7 My pain is constant, dull and aching  In the last 24 hours, has pain interfered with the following? General activity 7 Relation with others 7 Enjoyment of life 7 What TIME of day is your pain at its worst? morning Sleep (in general) Good  Pain is worse with: walking and standing Pain improves with: rest and medication Relief from Meds: 5  Mobility use a cane how many minutes can you walk? 5-10 ability to climb steps?  no do you drive?  yes  Function retired  Neuro/Psych weakness trouble walking  Prior Studies Any changes since last visit?  no  Physicians involved in your care Any changes since last visit?  no   Family History  Problem Relation Age of Onset  . Other Mother     died @ 81, complications following childbirth  . COPD Father     died @ 49  . Heart failure Father   . Irregular heart beat Sister     1 sister with PPM.   History   Social History  . Marital Status: Married    Spouse Name: N/A  . Number of Children: N/A  . Years of Education:  N/A   Social History Main Topics  . Smoking status: Former Smoker -- 0.50 packs/day for 5 years    Types: Cigarettes    Quit date: 01/14/1980  . Smokeless tobacco: Never Used  . Alcohol Use: No  . Drug Use: No  . Sexual Activity: Not on file   Other Topics Concern  . None   Social History Narrative   Lives with Husband in [place].  Has a dtr in [place], [place] and a son in [place], [place].   Past Surgical History  Procedure Laterality Date  . Vesicovaginal fistula closure w/ tah  1964  . US echocardiography  04-26-09    EF 55-60%  . Cardiovascular stress test  05-13-2005    EF 67%  . Knee surgery    . Joint replacement  2000    left knee  . Abdominal hysterectomy  1964  . Toe shortened      right 2nd toe  . Cataract extraction      both eyes  . Mass excision  01/13/2012    Procedure: EXCISION MASS;  Surgeon: Ralene Ok, MD;  Location: [facility];  Service: General;  Laterality: Right;  Excision of Right Back Mass  . Left heart catheterization with coronary angiogram N/A 01/03/2014    Procedure: LEFT HEART CATHETERIZATION WITH CORONARY ANGIOGRAM;  Surgeon: Lorretta Harp, MD;  Location:  [facility] CATH [facility];  Service: Cardiovascular;  Laterality: N/A;  . Cardiac catheterization    . Eye surgery      cataract   Past Medical History  Diagnosis Date  . Mild aortic stenosis     a. 10/2010 Echo: Ef 60-65%, no rwma, mild LVH no AS.  Marland Kitchen Chronic back pain   . Peripheral neuropathy   . GERD (gastroesophageal reflux disease)   . Hypertension   . History of pneumonia   . History of bronchitis   . Basal cell carcinoma     a. 2013.  . Non-cardiac chest pain     LHC w/ nl coronaries and nl EF 60% -12/2013  . PAC (premature atrial contraction)   . Heart murmur   . Type II diabetes mellitus     TYPE 2   BP 150/60 mmHg  Pulse 72  Resp 14  SpO2 97%  Opioid Risk Score:   Fall Risk Score: Moderate Fall Risk (6-13 points)`1  Depression screen PHQ 2/9  No flowsheet data  found.   Review of Systems  HENT: Negative.   Eyes: Negative.   Respiratory: Negative.        Respiratory infections.  Cardiovascular: Negative.   Gastrointestinal: Negative.   Endocrine: Negative.   Genitourinary: Negative.   Musculoskeletal: Positive for myalgias, back pain and arthralgias.  Skin: Negative.   Allergic/Immunologic: Negative.   Neurological: Positive for weakness.       Trouble walking  Hematological: Negative.   Psychiatric/Behavioral: Negative.        Objective:   Physical Exam  Constitutional: She is oriented to person, place, and time. She appears well-developed and well-nourished.  HENT:  Head: Normocephalic and atraumatic.  Musculoskeletal:  No tenderness to palpation lumbar paraspinal muscles Straight leg raising is negative Motor strength is 5/5 bilateral hip flexor and extensor ankle dorsiflexor  Full lumbar flexion however extension limited to 25% as is lateral bending   Neurological: She is alert and oriented to person, place, and time.  Psychiatric: She has a normal mood and affect.  Nursing note and vitals reviewed.         Assessment & Plan:  1. Chronic low back pain with lumbar spondylosis has done quite well with radiofrequency neurotomies. At this point do not think she needs a repeat. Trial on tramadol 100 mg a day as needed. Recheck in 6 months however if the tramadol is not affected she'll call back and get an appointment so we can go back on the hydrocodone.  We discussed the risks and benefits of both hydrocodone and tramadol.

## 2014-05-29 ENCOUNTER — Telehealth: Payer: Self-pay | Admitting: Internal Medicine

## 2014-05-29 NOTE — Telephone Encounter (Signed)
LM for pt to returncall

## 2014-05-30 NOTE — Telephone Encounter (Signed)
Called and spoke to pt. Order placed for ONO. Pt stated she has not heard anything regarding ONO.   PCC's please advise.

## 2014-05-30 NOTE — Telephone Encounter (Signed)
Per Darlina Guys with [facility] that [facility] left message on pt's home number on 05/16/14 to arrange ONO. Called and spoke with patient and she stated that she is unable to check her messages on her home phone. Advised patient that [facility] is going to try again to reach her on her home and if unable to reach her there, will call her mobile number to arrange study.  Advised patient of this and she stated that she is going to be out with her husband today and she will have her mobile with her and she will have it on so [facility] can reach her to schedule. Advised patient that if she didn't hear from [facility] today, to call us back tomorrow and let us know. Rhonda J [name]

## 2014-05-30 NOTE — Telephone Encounter (Signed)
Called and spoke with Darlina Guys with [facility] in reference to order for ONO that was placed on 05/12/14. Melissa will check on status of order and call me back to advise. Rhonda J [name]

## 2014-05-31 NOTE — Telephone Encounter (Signed)
Per Darlina Guys with [facility], [facility] has contacted the patient and pt has been scheduled for ONO. Nothing else needed at this time. Rhonda J [name]

## 2014-05-31 NOTE — Telephone Encounter (Signed)
Pt states that she requested that [facility] contact her on her mobile number b/c she can not retrieve messages off her home phone. [facility] was to contact patient yesterday to arrange ono order placed on 05/12/14. Spoke with patient today and she still has not heard from [facility]. LMOAM for Darlina Guys with [facility] to please have [place] to contact patient today on her mobile number to arrange this ONO. Rhonda J [name]

## 2014-06-06 ENCOUNTER — Telehealth: Payer: Self-pay | Admitting: Internal Medicine

## 2014-06-06 NOTE — Telephone Encounter (Signed)
Per MW- ONO on RA was normal, no need for nocturnal o2  LMTCB for the pt

## 2014-06-07 NOTE — Telephone Encounter (Signed)
LMTCB

## 2014-06-14 ENCOUNTER — Encounter: Payer: Self-pay | Admitting: Internal Medicine

## 2014-06-14 ENCOUNTER — Ambulatory Visit (INDEPENDENT_AMBULATORY_CARE_PROVIDER_SITE_OTHER): Payer: Medicare HMO | Admitting: Internal Medicine

## 2014-06-14 VITALS — BP 140/72 | HR 90 | Ht 65.0 in | Wt 235.0 lb

## 2014-06-14 DIAGNOSIS — J449 Chronic obstructive pulmonary disease, unspecified: Secondary | ICD-10-CM

## 2014-06-14 DIAGNOSIS — J9612 Chronic respiratory failure with hypercapnia: Secondary | ICD-10-CM | POA: Diagnosis not present

## 2014-06-14 DIAGNOSIS — I10 Essential (primary) hypertension: Secondary | ICD-10-CM | POA: Diagnosis not present

## 2014-06-14 NOTE — Assessment & Plan Note (Signed)
Gordon for now on toprol 50 mg bid but  Might consider in this setting trial of more specific BB >>  eg Bystolic, the most beta -1  selective Beta blocker available in sample form, with bisoprolol the most selective generic choice  on the market.   Defer to primary care for now

## 2014-06-14 NOTE — Assessment & Plan Note (Signed)
-   ono RA  Jun 01 2014 > no significant desats > d/c 02 06/14/2014

## 2014-06-14 NOTE — Telephone Encounter (Signed)
Pt aware.

## 2014-06-14 NOTE — Patient Instructions (Signed)
Plan A =automatic = symbicort 80 Take 2 puffs first thing in am and then another 2 puffs about 12 hours later.   Work on inhaler technique:  relax and gently blow all the way out then take a nice smooth deep breath back in, triggering the inhaler slightly before you start breathing in.  Hold for up to 5 seconds if you can.  Rinse and gargle with water when done  Goal is to use the spacer without the horn sounding so take a smooth slow deep breath when using it    Plan B  Only use your albuterol (proair) as a rescue medication to be used if you can't catch your breath by resting or doing a relaxed purse lip breathing pattern.  - The less you use it, the better it will work when you need it. - Ok to use up to 2 puffs  every 4 hours if you must but call for immediate appointment if use goes up over your usual need - Don't leave home without it !!  (think of it like the spare tire for your car)   Plan C = crisis =duoneb  Only use duoneb if use plan B and it doesn't work    Please schedule a follow up visit in 3 months but call sooner if needed

## 2014-06-14 NOTE — Assessment & Plan Note (Signed)
-   03/31/2014   Walked RA x one lap @ 185 stopped due to fatigue, minimal sob, slow to nl pace, no desat   - 05/12/14  PFTs  FEV1  0.96 (52%) ratio 69 with no reversibility, mild air trapping and dlco 64 corrects to 90     The proper method of use, as well as anticipated side effects, of a metered-dose inhaler are discussed and demonstrated to the patient. Improved effectiveness after extensive coaching during this visit to a level of approximately     50%  Even with spacer with baseline of < 25%   I had an extended discussion with the patient reviewing all relevant studies completed to date and  lasting 15 to 20 minutes of a 25 minute visit on the following ongoing concerns:  1)   As I explained to this patient in detail:  although there may be copd present, it may not be clinically relevant:   it does not appear to be limiting activity tolerance any more than a set of worn tires limits someone from driving a car  around a parking lot.  A new set of Michelins might look good but would have no perceived impact on the performance of the car and would not be worth the cost.  That is to say:   this pt is so sedentary I don't recommend aggressive pulmonary rx at this point unless limiting symptoms arise or acute exacerbations become as issue, neither of which is the case now.  I asked the patient to contact this office at any time in the future should either of these problems arise.    2) having said that, it does appear her perceived need for saba in evening suggests an unaddressed dtc asthma component  DDX of  difficult airways management all start with A and  include Adherence, Ace Inhibitors, Acid Reflux, Active Sinus Disease, Alpha 1 Antitripsin deficiency, Anxiety masquerading as Airways dz,  ABPA,  allergy(esp in young), Aspiration (esp in elderly), Adverse effects of DPI,  Active smokers, plus two Bs  = Bronchiectasis and Beta blocker use..and one C= CHF  Adherence is always the initial "prime  suspect" and is a multilayered concern that requires a "trust but verify" approach in every patient - starting with knowing how to use medications, especially inhalers, correctly, keeping up with refills and understanding the fundamental difference between maintenance and prns vs those medications only taken for a very short course and then stopped and not refilled.   ? Adverse effect of BB > may need to consider alternative to high dose toprol depending on response to teaching approp hfa   Each maintenance medication was reviewed in detail including most importantly the difference between maintenance and as needed and under what circumstances the prns are to be used.  Please see instructions for details which were reviewed in writing and the patient given a copy.

## 2014-06-14 NOTE — Progress Notes (Addendum)
Subjective:    Patient ID: [name] [name], female    DOB: Sep 30, 1930,  MRN: [id number]    Brief patient profile:  84 yowf quit smoking 1980s s sequelae slowed somewhat by back / leg problems x 2012 with h/o sob x one flight x 2000  >> Rx Nov 2015 on sporadic symbicort /saba  then 1st of December 2015 onset of cough indolent but gradually worse and refractory to out pt rx so seen after admit in pulmonary clinic 03/03/14 with pfts showing GOLD II criteria for copd 05/12/14   Admit date: 02/01/2014 Discharge date: 02/07/2014  Discharge Diagnoses:  HCAP (note there was no def as dz on cxr  On same day CT showed "patches") -Continued Zosyn (over 3 days)and transitioned to po levofloxacin without any difficulty -d/c vancomycin -Continue bronchodilators -Pulmonary hygiene - the patient will be discharged with Levofloxacin for 2 additional days which will complete 8 days of therapy Acute respiratory failure -Presently stable on 2L -secondary to HCAP -wean oxygen for sat >92% Atypical chest pain -Patient had normal heart catheterization on 01/03/2014 -EKG without any ST-T wave changes -CTA chest negative for pulmonary embolus Atrial Dysrthymia -Patient recently had a two-week event monitor which was negative for atrial fibrillation -Showed the patient had frequent PACs -Continue metoprolol succinate 50 mg twice a day Bacteremia-CoNS -contaminant -Discontinue vancomycin Diabetes mellitus type 2 -02/02/14--Hemoglobin A1c 7.5 -NovoLog sliding scale -The patient's gabapentin dose was decreased and adjusted for her renal function--she will go home with gabapentin 600 mg twice a day -Given improvement in the patient's renal function, the patient will be started back on her usual dose of metformin -The patient's renal function should be monitored regularly in the outpatient setting -Serum creatinine 0.91 at time of discharge Hypertension -Continue metoprolol succinate -Hold losartan and HCTZ  due to soft BP--Blood pressure has remained stable off of these agents -due to pt's aortic stenosis and diabetes mellitus, restart lower dose losartan 25 mg daily Hyperlipidemia -continue Crestor Deconditioning -PT eval -will need SNF      03/03/2014 1st [facility] office visit/ [name] [name]  / consult requested by Dr Leonides Schanz Chief Complaint  Patient presents with  . Pulmonary Consult    Referred by Dr. Theadore Nan. Pt states that her breathing is "fair".  She has had a little bit of wheezing.   walking s stopping x 8 min s 02 , cough persists but improved, minimal mucoid sputum esp in am  rec Plan A =automatic = symbicort 80 Take 2 puffs first thing in am and then another 2 puffs about 12 hours later.  Work on inhaler technique:  relax and gently blow all the way out then take a nice smooth deep breath back in, triggering the inhaler at same time you start breathing in.  Hold for up to 5 seconds if you can.  Rinse and gargle with water when done Plan B  Only use your albuterol (proair) as a rescue medication Plan C = crisis =duoneb  Only use duoneb if use plan B and it doesn't work. Use 02 2lpm with activity and at bedtime Add pepcid 20 mg one at bedtime GERD diet    04/07/14 > seen at [facility] > responded to nebs    05/12/2014 f/u ov/[name] [name] re: mild asthma Chief Complaint  Patient presents with  . Follow-up    Pt had PFT performed today. She still has sob with exertion and when bending down. Pt denies cough chest tightness and or wheezing.  Overall doing  much better though having trouble following instructions on how/when  to use resp rx  Only sob with activities above nl adl, like if she gets in a hurry or climbs steps [facility] = 1)  rec Plan A =automatic = symbicort 80 Take 2 puffs first thing in am and then another 2 puffs about 12 hours later.  Work on inhaler technique:  relax and gently blow all the way out then take a nice smooth deep breath back in, triggering the inhaler  at same time you start breathing in.  Hold for up to 5 seconds if you can.  Rinse and gargle with water when done Plan B  Only use your albuterol (proair) as a rescue medication  Plan C = crisis =duoneb  Only use duoneb if use plan B and it doesn't work Please see patient coordinator before you leave today  to schedule overnight 02 on room air  > neg desat Please schedule a follow up visit in 3 months but call sooner if needed        06/14/2014 f/u ov/[name] [name] re: copd II/ ab component worse with hot humid weather/ poor hfa even with spacer  Chief Complaint  Patient presents with  . Follow-up    Pt states breathing had improved until humid weather began. She is now using rescue inhaler 2-3 x per day and neb every night.   uses neb sev times a week typically at > 12 h since last symbicort and sleeps fine  Still shopping leaning on buggy at [facility] slowed by back / legs more than breathing   No obvious day to day or daytime variabilty or assoc chronic cough or cp or chest tightness, subjective wheeze overt sinus or hb symptoms. No unusual exp hx or h/o childhood pna/ asthma or knowledge of premature birth.  Sleeping ok without nocturnal  or early am exacerbation  of respiratory  c/o's or need for noct saba. Also denies any obvious fluctuation of symptoms with weather or environmental changes or other aggravating or alleviating factors except as outlined above   Current Medications, Allergies, Complete Past Medical History, Past Surgical History, Family History, and Social History were reviewed in [employer] record.  ROS  The following are not active complaints unless bolded sore throat, dysphagia, dental problems, itching, sneezing,  nasal congestion or excess/ purulent secretions, ear ache,   fever, chills, sweats, unintended wt loss, pleuritic or exertional cp, hemoptysis,  orthopnea pnd or leg swelling, presyncope, palpitations, heartburn, abdominal pain, anorexia, nausea,  vomiting, diarrhea  or change in bowel or urinary habits, change in stools or urine, dysuria,hematuria,  rash, arthralgias, visual complaints, headache, numbness weakness or ataxia or problems with walking or coordination,  change in mood/affect or memory.                                    Objective:   Physical Exam  Obese amb wf nad   06/14/2014          235  Wt Readings from Last 3 Encounters:  05/12/14 225 lb (102.059 kg)  05/04/14 225 lb (102.059 kg)  04/17/14 221 lb (100.245 kg)    Vital signs reviewed     HEENT: nl dentition, turbinates, and orophanx. Nl external ear canals without cough reflex   NECK :  without JVD/Nodes/TM/ nl carotid upstrokes bilaterally   LUNGS: no acc muscle use, clear to A and P bilaterally  without cough on insp or exp maneuvers   CV:  RRR  no s3 or murmur or increase in P2,    ABD:  soft and nontender with nl excursion in the supine position. No bruits or organomegaly, bowel sounds nl  MS:  warm without deformities, calf tenderness, cyanosis or clubbing  SKIN: warm and dry without lesions    NEURO:  alert, approp, no deficits        I personally reviewed images and agree with radiology impression as follows:  CXR:  03/31/14 No acute cardiopulmonary abnormality.            Assessment & Plan:   Outpatient Encounter Prescriptions as of 06/14/2014  Medication Sig  . albuterol (PROVENTIL HFA;VENTOLIN HFA) 108 (90 BASE) MCG/ACT inhaler Inhale 1 puff into the lungs every 6 (six) hours as needed for wheezing or shortness of breath.  Marland Kitchen aspirin 81 MG tablet Take 81 mg by mouth every Monday, Wednesday, and Friday. Take 1 on Monday, Wednesday, and Friday only  . BIOTIN PO Take 1 tablet by mouth daily.  . budesonide-formoterol (SYMBICORT) 80-4.5 MCG/ACT inhaler Inhale 2 puffs into the lungs 2 (two) times daily.  . calcium citrate (CALCITRATE - DOSED IN MG ELEMENTAL CALCIUM) 950 MG tablet Take 200 mg of elemental calcium by mouth  daily.  . cholecalciferol (VITAMIN D) 1000 UNITS tablet Take 1,000 Units by mouth daily.  . famotidine (PEPCID) 20 MG tablet Take 20 mg by mouth daily.  . furosemide (LASIX) 20 MG tablet Take 20 mg by mouth as needed for fluid (Left foot swelling).  . gabapentin (NEURONTIN) 300 MG capsule Take 2 capsules (600 mg total) by mouth 2 (two) times daily.  Marland Kitchen glimepiride (AMARYL) 2 MG tablet Take 2 mg by mouth daily with breakfast.  . ipratropium-albuterol (DUONEB) 0.5-2.5 (3) MG/3ML SOLN Take 3 mLs by nebulization every 6 (six) hours as needed.   Marland Kitchen losartan (COZAAR) 50 MG tablet Take 50 mg by mouth 2 (two) times daily. 1/2 tablet by mouth daily  . metFORMIN (GLUCOPHAGE) 1000 MG tablet Take 1 tablet (1,000 mg total) by mouth 2 (two) times daily with a meal.  . metoprolol succinate (TOPROL-XL) 50 MG 24 hr tablet Take 1 tablet (50 mg total) by mouth 2 (two) times daily.  . nitroGLYCERIN (NITROSTAT) 0.4 MG SL tablet Place 1 tablet (0.4 mg total) under the tongue every 5 (five) minutes as needed for chest pain.  . pantoprazole (PROTONIX) 40 MG tablet Take 40 mg by mouth every morning.   . rosuvastatin (CRESTOR) 5 MG tablet Take 5 mg by mouth at bedtime.  Marland Kitchen Spacer/Aero-Holding Chambers (AEROCHAMBER MV) inhaler Use as instructed  . traMADol (ULTRAM) 50 MG tablet Take 2 tablets (100 mg total) by mouth daily as needed.  . triamcinolone (KENALOG) 0.025 % cream Apply 1 application topically as needed (as needed for leg pain).   . [DISCONTINUED] fluticasone (FLONASE) 50 MCG/ACT nasal spray Place 2 sprays into both nostrils daily. (Patient taking differently: Place 2 sprays into both nostrils daily as needed. )  . [DISCONTINUED] HYDROcodone-acetaminophen (NORCO/VICODIN) 5-325 MG per tablet Take 1-2 tablets by mouth daily as needed for moderate pain. (Patient not taking: Reported on 06/14/2014)   No facility-administered encounter medications on file as of 06/14/2014.

## 2014-06-15 ENCOUNTER — Encounter: Payer: Self-pay | Admitting: Adult Health

## 2014-06-15 NOTE — Progress Notes (Signed)
Patient ID: [name] [name], female   DOB: [birth year], 81 y.o.   MRN: [id number]   02/22/14  Facility:  Nursing Home Location:  [place] Room Number: 606-P LEVEL OF CARE:  SNF (31)   Chief Complaint  Patient presents with  . Discharge Note    Physical deconditioning, HCAP, hypertension, atypical dysrhythmia, diabetes mellitus type 2, neuropathy and hyperlipidemia    HISTORY OF PRESENT ILLNESS:  This is an 81 year old female who is for discharge home with home health PT for endurance, OT for ADLs, Nurse for disease management and CNA for showers. DME: Shower chair and O2 @ 2L/min via [place] portable and gas. She has been admitted to [facility] on 02/07/14 from [facility]. She has PMH of hypertension, diabetes mellitus, mild aortic stenosis and atypical chest pain with normal catheterization 01/03/14. She had SOB, coughing and epistaxis. CT angiogram in the ED was negative for PE but showed scattered patchy and nodular densities in bilateral lungs. She was treated for HCAP with Zosyn and discharged on Levaquin. HCAP is now resolved.  Patient was admitted to this facility for short-term rehabilitation after the patient's recent hospitalization.  Patient has completed SNF rehabilitation and therapy has cleared the patient for discharge.   PAST MEDICAL HISTORY:  Past Medical History  Diagnosis Date  . Mild aortic stenosis     a. 10/2010 Echo: Ef 60-65%, no rwma, mild LVH no AS.  Marland Kitchen Chronic back pain   . Peripheral neuropathy   . GERD (gastroesophageal reflux disease)   . Hypertension   . History of pneumonia   . History of bronchitis   . Basal cell carcinoma     a. 2013.  . Non-cardiac chest pain     LHC w/ nl coronaries and nl EF 60% -12/2013  . PAC (premature atrial contraction)   . Heart murmur   . Type II diabetes mellitus     TYPE 2    CURRENT MEDICATIONS: Reviewed per MAR/see medication list  Allergies  Allergen Reactions  . Codeine  Nausea And Vomiting  . Demerol Other (See Comments)    Drunk feeling"  . Doxycycline Nausea And Vomiting  . Statins Other (See Comments)    "hurt"   . Zetia [Ezetimibe] Other (See Comments)    hurt     REVIEW OF SYSTEMS:  GENERAL: no change in appetite, no fatigue, no weight changes, no fever, chills or weakness RESPIRATORY: no cough, SOB, DOE, wheezing, hemoptysis CARDIAC: no chest pain,  or palpitations GI: no abdominal pain, diarrhea, constipation, heart burn, nausea or vomiting  PHYSICAL EXAMINATION  GENERAL: no acute distress, normal body habitus EYES: conjunctivae normal, sclerae normal, normal eye lids NECK: supple, trachea midline, no neck masses, no thyroid tenderness, no thyromegaly LYMPHATICS: no LAN in the neck, no supraclavicular LAN RESPIRATORY: breathing is even & unlabored, BS CTAB CARDIAC: RRR, no murmur,no extra heart sounds, BLE edema trace GI: abdomen soft, normal BS, no masses, no tenderness, no hepatomegaly, no splenomegaly EXTREMITIES: Able to move 4 extremities PSYCHIATRIC: the patient is alert & oriented to person, affect & behavior appropriate  LABS/RADIOLOGY: Labs reviewed: 02/20/14  Venous doppler on LLE is negative for DVT 02/15/14  WBC 10.8 hemoglobin 10.8 hematocrit 34.6 MCV 96.5 sodium 143 potassium 4.3 glucose 142 BUN 15 creatinine 0.85 total bilirubin 0.4 alkaline phosphatase 78 SGOT 15 SGPT 9 total protein 6.5 of the human 3.9 calcium 8.8 hemoglobin A1c 7.1 02/14/14  Venous doppler on RLE is negative for  DVT Basic Metabolic Panel:  Recent Labs  01/02/14 1540 01/03/14 1200  02/03/14 0600 02/04/14 0447 02/06/14 0604  NA 141 141  < > 138 139 141  K 4.1 4.7  < > 4.4 4.2 4.1  CL 97 99  < > 99 99 98  CO2 36* 33*  < > 31 33* 35*  GLUCOSE 73 117*  < > 146* 183* 171*  BUN 20 18  < > 19 12 9   CREATININE 0.9 0.93  < > 1.19* 0.93 0.91  CALCIUM 8.6 8.9  < > 8.2* 8.4 8.6  MG 1.6 1.5  --   --   --   --   < > = values in this interval not  displayed. Liver Function Tests:  Recent Labs  01/03/14 1200 01/10/14 0703  AST 17 21  ALT 11 11  ALKPHOS 71 84  BILITOT 0.4 0.4  PROT 6.4 7.0  ALBUMIN 3.6 3.4*   CBC:  Recent Labs  01/09/14 1938 01/10/14 0703  02/01/14 1730 02/02/14 0651 02/03/14 0600 02/06/14 0604  WBC 14.4* 16.0*  < > 15.9* 10.0 7.2 8.4  NEUTROABS 11.3* 15.3*  --  12.4*  --   --   --   HGB 10.3* 10.5*  < > 11.9* 10.1* 9.1* 9.5*  HCT 33.8* 34.0*  < > 38.1 32.7* 30.1* 31.8*  MCV 99.1 102.1*  < > 99.5 99.7 100.3* 101.6*  PLT 225 209  < > 232 197 172 142*  < > = values in this interval not displayed.  Cardiac Enzymes:  Recent Labs  01/03/14 1200 01/09/14 1938 02/01/14 1730  TROPONINI <0.03 <0.03 <0.03   BNP: Invalid input(s): POCBNP CBG:  Recent Labs  02/06/14 1719 02/07/14 0748 02/07/14 1200  GLUCAP 145* 159* 229*      US Venous Img Lower Unilateral Left  05/18/2014   CLINICAL DATA:  Left lower extremity edema  EXAM: Left LOWER EXTREMITY VENOUS DOPPLER ULTRASOUND  TECHNIQUE: Gray-scale sonography with graded compression, as well as color Doppler and duplex ultrasound were performed to evaluate the lower extremity deep venous systems from the level of the common femoral vein and including the common femoral, femoral, profunda femoral, popliteal and calf veins including the posterior tibial, peroneal and gastrocnemius veins when visible. The superficial great saphenous vein was also interrogated. Spectral Doppler was utilized to evaluate flow at rest and with distal augmentation maneuvers in the common femoral, femoral and popliteal veins.  COMPARISON:  None.  FINDINGS: Contralateral Common Femoral Vein: Respiratory phasicity is normal and symmetric with the symptomatic side. No evidence of thrombus. Normal compressibility.  Common Femoral Vein: No evidence of thrombus. Normal compressibility, respiratory phasicity and response to augmentation.  Saphenofemoral Junction: No evidence of thrombus.  Normal compressibility and flow on color Doppler imaging.  Profunda Femoral Vein: No evidence of thrombus. Normal compressibility and flow on color Doppler imaging.  Femoral Vein: No evidence of thrombus. Normal compressibility, respiratory phasicity and response to augmentation.  Popliteal Vein: No evidence of thrombus. Normal compressibility, respiratory phasicity and response to augmentation.  Calf Veins: No evidence of thrombus. Normal compressibility and flow on color Doppler imaging.  Superficial Great Saphenous Vein: No evidence of thrombus. Normal compressibility and flow on color Doppler imaging.  IMPRESSION: No evidence of deep venous thrombosis.   Electronically Signed   By: Suzy Bouchard M.D.   On: 05/18/2014 18:27    ASSESSMENT/PLAN:   Physical deconditioning - for home health PT, OT, Nursing and CNA HCAP - Resolved; continue O2 @  2L/min via [place] continuously Hypertension - well controlled; continue Toprol XL 50 mg 1 tab by mouth twice a day and losartan 37.5 mg by mouth daily Atrial dysrhythmia- continue Toprol-XL 50 mg by mouth daily Diabetes mellitus, type II - we'll repeat A1c 7.1; continue metformin 1000 mg by mouth twice a day and Amaryl 1 mg 1 tab by mouth daily Neuropathic pain - continue gabapentin 600 mg by mouth twice a day Hyperlipidemia - continue Lipitor 20 mg 1 tab by mouth every at bedtime    I have filled out patient's discharge paperwork and written prescriptions.  Patient will receive home health PT, OT, Nurse and CNA.  DME provided:  Shower chair and O2 @ 2L/min via [place] portable and gas  Total discharge time: Greater than 30 minutes  Discharge time involved coordination of the discharge process with Education officer, museum, nursing staff and therapy department. Medical justification for home health services/DME verified.   [facility], NP [employer] [phone]

## 2014-06-28 ENCOUNTER — Telehealth: Payer: Self-pay | Admitting: Internal Medicine

## 2014-06-28 NOTE — Telephone Encounter (Signed)
Pt is aware. OV has been scheduled for 06/30/14. Nothing further was needed.

## 2014-06-28 NOTE — Telephone Encounter (Signed)
Spoke with pt, c/o worsening sob today.  Denies chest tightness/pain, congestion.   02 sat is 97%, pulse is 78-106 today. Pt used albuterol inhaler once today, no nebs.   Pt uses [facility].  MW please advise on recs.  Thanks!

## 2014-06-28 NOTE — Telephone Encounter (Signed)
Can't evaluate this over the phone - can use alb prn until ov later this week with all meds in hand to see me or tammy np

## 2014-06-30 ENCOUNTER — Encounter: Payer: Self-pay | Admitting: Internal Medicine

## 2014-06-30 ENCOUNTER — Ambulatory Visit: Payer: Medicare HMO | Admitting: Internal Medicine

## 2014-06-30 ENCOUNTER — Telehealth: Payer: Self-pay | Admitting: Internal Medicine

## 2014-06-30 VITALS — BP 132/66 | HR 71 | Ht 63.0 in | Wt 233.0 lb

## 2014-06-30 DIAGNOSIS — J449 Chronic obstructive pulmonary disease, unspecified: Secondary | ICD-10-CM | POA: Diagnosis not present

## 2014-06-30 DIAGNOSIS — I10 Essential (primary) hypertension: Secondary | ICD-10-CM

## 2014-06-30 MED ORDER — BISOPROLOL FUMARATE 5 MG PO TABS: 5.0000 mg | ORAL_TABLET | Freq: Every day | ORAL | Status: DC

## 2014-06-30 NOTE — Telephone Encounter (Signed)
Called to verify with pharmacy Dr. Gustavus Bryant instructions to stop Metoprolol and start Bisprolol.  Nothing further needed.

## 2014-06-30 NOTE — Patient Instructions (Addendum)
Stop metaprolol   Start bisoprolol 5 mg twice daily   Work on inhaler technique:  relax and gently blow all the way out then take a nice smooth deep breath back in, triggering the inhaler at same time you start breathing in.  Hold for up to 5 seconds if you can.  Rinse and gargle with water when done    Weight control is simply a matter of calorie balance which needs to be tilted in your favor by eating less and exercising more.  To get the most out of exercise, you need to be continuously aware that you are short of breath, but never out of breath, for 30 minutes daily. As you improve, it will actually be easier for you to do the same amount of exercise  in  30 minutes so always push to the level where you are short of breath.  If this does not result in gradual weight reduction then I strongly recommend you see a nutritionist with a food diary x 2 weeks so that we can work out a negative calorie balance which is universally effective in steady weight loss programs.  Think of your calorie balance like you do your bank account where in this case you want the balance to go down so you must take in less calories than you burn up.  It's just that simple:  Hard to do, but easy to understand.  Good luck!   Pulmonary follow up is as needed

## 2014-06-30 NOTE — Progress Notes (Addendum)
Subjective:    Patient ID: [name] [name], female    DOB: [birth year],  MRN: [id number]    Brief patient profile:  84 yowf quit smoking 1980s s sequelae slowed somewhat by back / leg problems x 2012 with h/o sob x one flight x 2000  >> Rx Nov 2015 on sporadic symbicort /saba  then 1st of December 2015 onset of cough indolent but gradually worse and refractory to out pt rx so seen after admit in pulmonary clinic 03/03/14 with pfts showing GOLD II criteria for copd 05/12/14   Admit date: 02/01/2014 Discharge date: 02/07/2014  Discharge Diagnoses:  HCAP (note there was no def as dz on cxr  On same day CT showed "patches") -Continued Zosyn (over 3 days)and transitioned to po levofloxacin without any difficulty -d/c vancomycin -Continue bronchodilators -Pulmonary hygiene - the patient will be discharged with Levofloxacin for 2 additional days which will complete 8 days of therapy Acute respiratory failure -Presently stable on 2L -secondary to HCAP -wean oxygen for sat >92% Atypical chest pain -Patient had normal heart catheterization on 01/03/2014 -EKG without any ST-T wave changes -CTA chest negative for pulmonary embolus Atrial Dysrthymia -Patient recently had a two-week event monitor which was negative for atrial fibrillation -Showed the patient had frequent PACs -Continue metoprolol succinate 50 mg twice a day Bacteremia-CoNS -contaminant -Discontinue vancomycin Diabetes mellitus type 2 -02/02/14--Hemoglobin A1c 7.5 -NovoLog sliding scale -The patient's gabapentin dose was decreased and adjusted for her renal function--she will go home with gabapentin 600 mg twice a day -Given improvement in the patient's renal function, the patient will be started back on her usual dose of metformin -The patient's renal function should be monitored regularly in the outpatient setting -Serum creatinine 0.91 at time of discharge Hypertension -Continue metoprolol succinate -Hold losartan and HCTZ  due to soft BP--Blood pressure has remained stable off of these agents -due to pt's aortic stenosis and diabetes mellitus, restart lower dose losartan 25 mg daily Hyperlipidemia -continue Crestor Deconditioning -PT eval -will need SNF      03/03/2014 1st [facility] office visit/ Melvyn Novas  / consult requested by Dr Leonides Schanz Chief Complaint  Patient presents with  . Pulmonary Consult    Referred by Dr. Theadore Nan. Pt states that her breathing is "fair".  She has had a little bit of wheezing.   walking s stopping x 8 min s 02 , cough persists but improved, minimal mucoid sputum esp in am  rec Plan A =automatic = symbicort 80 Take 2 puffs first thing in am and then another 2 puffs about 12 hours later.  Work on inhaler technique:  relax and gently blow all the way out then take a nice smooth deep breath back in, triggering the inhaler at same time you start breathing in.  Hold for up to 5 seconds if you can.  Rinse and gargle with water when done Plan B  Only use your albuterol (proair) as a rescue medication Plan C = crisis =duoneb  Only use duoneb if use plan B and it doesn't work. Use 02 2lpm with activity and at bedtime Add pepcid 20 mg one at bedtime GERD diet    04/07/14 > seen at [facility] > responded to nebs    05/12/2014 f/u ov/Anubis Fundora re: mild asthma Chief Complaint  Patient presents with  . Follow-up    Pt had PFT performed today. She still has sob with exertion and when bending down. Pt denies cough chest tightness and or wheezing.  Overall doing  much better though having trouble following instructions on how/when  to use resp rx  Only sob with activities above nl adl, like if she gets in a hurry or climbs steps [facility] = 1)  rec Plan A =automatic = symbicort 80 Take 2 puffs first thing in am and then another 2 puffs about 12 hours later.  Work on inhaler technique:  relax and gently blow all the way out then take a nice smooth deep breath back in, triggering the inhaler  at same time you start breathing in.  Hold for up to 5 seconds if you can.  Rinse and gargle with water when done Plan B  Only use your albuterol (proair) as a rescue medication  Plan C = crisis =duoneb  Only use duoneb if use plan B and it doesn't work Please see patient coordinator before you leave today  to schedule overnight 02 on room air  > neg desat Please schedule a follow up visit in 3 months but call sooner if needed        06/14/2014 f/u ov/Takai Chiaramonte re: copd II/ ab component worse with hot humid weather/ poor hfa even with spacer  Chief Complaint  Patient presents with  . Follow-up    Pt states breathing had improved until humid weather began. She is now using rescue inhaler 2-3 x per day and neb every night.   uses neb sev times a week typically at > 12 h since last symbicort and sleeps fine  Still shopping leaning on buggy at [facility] slowed by back / legs more than breathing rec Plan A =automatic = symbicort 80 Take 2 puffs first thing in am and then another 2 puffs about 12 hours later.  Work on inhaler technique:  Plan B  Only use your albuterol (proair) as a rescue medication  Plan C = crisis =duoneb  Only use duoneb if use plan B and it doesn't work    06/30/2014 f/u ov/Jeslie Lowe re: ? aecopd (doubt)  Chief Complaint  Patient presents with  . Acute Visit    Pt c/o increased SOB for the past wk- starting to improve now. She has albuterol once per day on average.    Despite flare from "over doing it" still fine at hs / no am/noct flares either / no assoc  cough / never used duonbeb during flare  Much worse bending over    No obvious day to day or daytime variabilty or assoc chronic cough or cp or chest tightness, subjective wheeze overt sinus or hb symptoms. No unusual exp hx or h/o childhood pna/ asthma or knowledge of premature birth.  Sleeping ok without nocturnal  or early am exacerbation  of respiratory  c/o's or need for noct saba. Also denies any obvious fluctuation of  symptoms with weather or environmental changes or other aggravating or alleviating factors except as outlined above   Current Medications, Allergies, Complete Past Medical History, Past Surgical History, Family History, and Social History were reviewed in [employer] record.  ROS  The following are not active complaints unless bolded sore throat, dysphagia, dental problems, itching, sneezing,  nasal congestion or excess/ purulent secretions, ear ache,   fever, chills, sweats, unintended wt loss, pleuritic or exertional cp, hemoptysis,  orthopnea pnd or leg swelling, presyncope, palpitations, heartburn, abdominal pain, anorexia, nausea, vomiting, diarrhea  or change in bowel or urinary habits, change in stools or urine, dysuria,hematuria,  rash, arthralgias, visual complaints, headache, numbness weakness or ataxia or problems with walking  or coordination,  change in mood/affect or memory.                                    Objective:   Physical Exam  Obese amb wf nad   06/14/2014          235 >  06/29/14  233 = Body mass index is 41.28 kg/(m^2).  Wt Readings from Last 3 Encounters:  05/12/14 225 lb (102.059 kg)  05/04/14 225 lb (102.059 kg)  04/17/14 221 lb (100.245 kg)    Vital signs reviewed    HEENT: nl dentition, turbinates, and orophanx. Nl external ear canals without cough reflex   NECK :  without JVD/Nodes/TM/ nl carotid upstrokes bilaterally   LUNGS: no acc muscle use, clear to A and P bilaterally without cough on insp or exp maneuvers   CV:  RRR  no s3 or murmur or increase in P2,    ABD:  soft and nontender with nl excursion in the supine position. No bruits or organomegaly, bowel sounds nl  MS:  warm without deformities, calf tenderness, cyanosis or clubbing  SKIN: warm and dry without lesions    NEURO:  alert, approp, no deficits        I personally reviewed images and agree with radiology impression as follows:  CXR:   03/31/14 No acute cardiopulmonary abnormality.            Assessment & Plan:     Outpatient Encounter Prescriptions as of 06/30/2014  Medication Sig  . albuterol (PROVENTIL HFA;VENTOLIN HFA) 108 (90 BASE) MCG/ACT inhaler Inhale 1 puff into the lungs every 6 (six) hours as needed for wheezing or shortness of breath.  Marland Kitchen aspirin 81 MG tablet Take 81 mg by mouth every Monday, Wednesday, and Friday. Take 1 on Monday, Wednesday, and Friday only  . BIOTIN PO Take 1 tablet by mouth daily.  . budesonide-formoterol (SYMBICORT) 80-4.5 MCG/ACT inhaler Inhale 2 puffs into the lungs 2 (two) times daily.  . calcium citrate (CALCITRATE - DOSED IN MG ELEMENTAL CALCIUM) 950 MG tablet Take 200 mg of elemental calcium by mouth daily.  . cholecalciferol (VITAMIN D) 1000 UNITS tablet Take 1,000 Units by mouth daily.  . famotidine (PEPCID) 20 MG tablet Take 20 mg by mouth daily.  . furosemide (LASIX) 20 MG tablet Take 20 mg by mouth as needed for fluid (Left foot swelling).  . gabapentin (NEURONTIN) 300 MG capsule Take 2 capsules (600 mg total) by mouth 2 (two) times daily.  Marland Kitchen glimepiride (AMARYL) 2 MG tablet Take 2 mg by mouth daily with breakfast.  . ipratropium-albuterol (DUONEB) 0.5-2.5 (3) MG/3ML SOLN Take 3 mLs by nebulization every 6 (six) hours as needed.   Marland Kitchen losartan (COZAAR) 50 MG tablet Take 50 mg by mouth 2 (two) times daily. 1/2 tablet by mouth daily  . metFORMIN (GLUCOPHAGE) 1000 MG tablet Take 1 tablet (1,000 mg total) by mouth 2 (two) times daily with a meal.  . nitroGLYCERIN (NITROSTAT) 0.4 MG SL tablet Place 1 tablet (0.4 mg total) under the tongue every 5 (five) minutes as needed for chest pain.  . pantoprazole (PROTONIX) 40 MG tablet Take 40 mg by mouth every morning.   . rosuvastatin (CRESTOR) 5 MG tablet Take 5 mg by mouth at bedtime.  Marland Kitchen Spacer/Aero-Holding Chambers (AEROCHAMBER MV) inhaler Use as instructed  . traMADol (ULTRAM) 50 MG tablet Take 2 tablets (100 mg total) by mouth daily  as  needed.  . triamcinolone (KENALOG) 0.025 % cream Apply 1 application topically as needed (as needed for leg pain).   . [DISCONTINUED] metoprolol succinate (TOPROL-XL) 50 MG 24 hr tablet Take 1 tablet (50 mg total) by mouth 2 (two) times daily.  . bisoprolol (ZEBETA) 5 MG tablet Take 1 tablet (5 mg total) by mouth daily.   No facility-administered encounter medications on file as of 06/30/2014.

## 2014-07-01 ENCOUNTER — Encounter: Payer: Self-pay | Admitting: Internal Medicine

## 2014-07-01 NOTE — Assessment & Plan Note (Signed)
Strongly prefer in this setting: Bystolic, the most beta -1  selective Beta blocker available in sample form, with bisoprolol the most selective generic choice  on the market.    Try bisoprolol 5 mg bid to see if helps

## 2014-07-01 NOTE — Assessment & Plan Note (Signed)
Body mass index is 41.28 kg/(m^2).  Lab Results  Component Value Date   TSH 0.99 01/02/2014     This is definitely a factor contributing to doe, esp bending over symptoms > cal bal reviewed / deferred f/u to primary care.

## 2014-07-01 NOTE — Assessment & Plan Note (Addendum)
-   03/31/2014   Walked RA x one lap @ 185 stopped due to fatigue, minimal sob, slow to nl pace, no desat   - 05/12/14  PFTs  FEV1  0.96 (52%) ratio 69 with no reversibility, mild air trapping and dlco 64 corrects to 90  - 06/30/2014 p extensive coaching HFA effectiveness =    50%  Baseline/ 75% with training  - 6/187/2016   Walked RA x one lap @ 185 stopped due to  Back pain @ nl pace s sob or desat   Symptoms are markedly disproportionate to objective findings and not clear this is all a lung problem but pt does appear to have difficult airway management issues. DDX of  difficult airways management all start with A and  include Adherence, Ace Inhibitors, Acid Reflux, Active Sinus Disease, Alpha 1 Antitripsin deficiency, Anxiety masquerading as Airways dz,  ABPA,  allergy(esp in young), Aspiration (esp in elderly), Adverse effects of meds,  Active smokers, A bunch of PE's (a small clot burden can't cause this syndrome unless there is already severe underlying pulm or vascular dz with poor reserve) plus two Bs  = Bronchiectasis and Beta blocker use..and one C= CHF   Adherence is always the initial "prime suspect" and is a multilayered concern that requires a "trust but verify" approach in every patient - starting with knowing how to use medications, especially inhalers, correctly, keeping up with refills and understanding the fundamental difference between maintenance and prns vs those medications only taken for a very short course and then stopped and not refilled.  -The proper method of use, as well as anticipated side effects, of a metered-dose inhaler are discussed and demonstrated to the patient. Improved effectiveness after extensive coaching during this visit to a level of approximately  75% s spacer   ? BB effect > see hbp   ? Anxiety/obesity/ deconditiong see sep a/p  I had an extended discussion with the patient reviewing all relevant studies completed to date and  lasting 15 to 20 minutes of a 25  minute visit on the following ongoing concerns:  Each maintenance medication was reviewed in detail including most importantly the difference between maintenance and as needed and under what circumstances the prns are to be used.  Please see instructions for details which were reviewed in writing and the patient given a copy.   Think of your respiratory medications as multiple steps you can take to control your symptoms and avoid having to go to the ER Plan A is your maintenance daily no matter what meds: Plan B only use after you've used your maintenance (Plan A) medication, and only if you can't catch your breath: Plan C only use after you've used plan A and B and still can't catch your breath: Plan D(for Doctor):  If you've used A thru C and not doing a lot better or still needing C more than a once a day,  D = call the doctor for evaluation asap Plan E (for ER):  If still not able to catch your breath, even after using your nebulizer up to every 4 hours, go to ER   Pulmonary f/u is prn

## 2014-07-10 ENCOUNTER — Encounter: Payer: Self-pay | Admitting: Internal Medicine

## 2014-07-10 ENCOUNTER — Other Ambulatory Visit: Payer: Self-pay

## 2014-07-31 ENCOUNTER — Other Ambulatory Visit: Payer: Self-pay | Admitting: Internal Medicine

## 2014-08-10 ENCOUNTER — Other Ambulatory Visit (HOSPITAL_COMMUNITY): Payer: Self-pay | Admitting: Family Medicine

## 2014-08-10 DIAGNOSIS — Z1231 Encounter for screening mammogram for malignant neoplasm of breast: Secondary | ICD-10-CM

## 2014-08-18 ENCOUNTER — Emergency Department (HOSPITAL_COMMUNITY): Payer: Medicare HMO

## 2014-08-18 ENCOUNTER — Encounter (HOSPITAL_COMMUNITY): Payer: Self-pay | Admitting: Emergency Medicine

## 2014-08-18 ENCOUNTER — Inpatient Hospital Stay (HOSPITAL_COMMUNITY)
Admission: EM | Admit: 2014-08-18 | Discharge: 2014-08-24 | DRG: 308 | Disposition: A | Discharge status: Skilled Nursing Facility | Payer: Medicare HMO | Attending: Internal Medicine | Admitting: Internal Medicine

## 2014-08-18 DIAGNOSIS — I119 Hypertensive heart disease without heart failure: Secondary | ICD-10-CM | POA: Diagnosis not present

## 2014-08-18 DIAGNOSIS — I110 Hypertensive heart disease with heart failure: Secondary | ICD-10-CM | POA: Diagnosis present

## 2014-08-18 DIAGNOSIS — Z85828 Personal history of other malignant neoplasm of skin: Secondary | ICD-10-CM

## 2014-08-18 DIAGNOSIS — Z96652 Presence of left artificial knee joint: Secondary | ICD-10-CM | POA: Diagnosis present

## 2014-08-18 DIAGNOSIS — Z87891 Personal history of nicotine dependence: Secondary | ICD-10-CM

## 2014-08-18 DIAGNOSIS — Z79899 Other long term (current) drug therapy: Secondary | ICD-10-CM | POA: Diagnosis not present

## 2014-08-18 DIAGNOSIS — I10 Essential (primary) hypertension: Secondary | ICD-10-CM | POA: Diagnosis not present

## 2014-08-18 DIAGNOSIS — J449 Chronic obstructive pulmonary disease, unspecified: Secondary | ICD-10-CM

## 2014-08-18 DIAGNOSIS — E538 Deficiency of other specified B group vitamins: Secondary | ICD-10-CM | POA: Diagnosis present

## 2014-08-18 DIAGNOSIS — R011 Cardiac murmur, unspecified: Secondary | ICD-10-CM | POA: Diagnosis not present

## 2014-08-18 DIAGNOSIS — K219 Gastro-esophageal reflux disease without esophagitis: Secondary | ICD-10-CM | POA: Diagnosis not present

## 2014-08-18 DIAGNOSIS — E119 Type 2 diabetes mellitus without complications: Secondary | ICD-10-CM | POA: Diagnosis not present

## 2014-08-18 DIAGNOSIS — E1142 Type 2 diabetes mellitus with diabetic polyneuropathy: Secondary | ICD-10-CM | POA: Diagnosis present

## 2014-08-18 DIAGNOSIS — R001 Bradycardia, unspecified: Secondary | ICD-10-CM | POA: Diagnosis present

## 2014-08-18 DIAGNOSIS — Z7982 Long term (current) use of aspirin: Secondary | ICD-10-CM | POA: Diagnosis not present

## 2014-08-18 DIAGNOSIS — I350 Nonrheumatic aortic (valve) stenosis: Secondary | ICD-10-CM | POA: Diagnosis present

## 2014-08-18 DIAGNOSIS — N179 Acute kidney failure, unspecified: Secondary | ICD-10-CM | POA: Diagnosis not present

## 2014-08-18 DIAGNOSIS — E876 Hypokalemia: Secondary | ICD-10-CM | POA: Diagnosis not present

## 2014-08-18 DIAGNOSIS — I509 Heart failure, unspecified: Secondary | ICD-10-CM

## 2014-08-18 DIAGNOSIS — Z6834 Body mass index (BMI) 34.0-34.9, adult: Secondary | ICD-10-CM

## 2014-08-18 DIAGNOSIS — R0902 Hypoxemia: Secondary | ICD-10-CM | POA: Diagnosis present

## 2014-08-18 DIAGNOSIS — R410 Disorientation, unspecified: Secondary | ICD-10-CM | POA: Diagnosis not present

## 2014-08-18 DIAGNOSIS — I4891 Unspecified atrial fibrillation: Secondary | ICD-10-CM | POA: Diagnosis not present

## 2014-08-18 DIAGNOSIS — Z7951 Long term (current) use of inhaled steroids: Secondary | ICD-10-CM | POA: Diagnosis not present

## 2014-08-18 DIAGNOSIS — I5033 Acute on chronic diastolic (congestive) heart failure: Secondary | ICD-10-CM | POA: Diagnosis present

## 2014-08-18 DIAGNOSIS — Z66 Do not resuscitate: Secondary | ICD-10-CM | POA: Diagnosis present

## 2014-08-18 DIAGNOSIS — G9340 Encephalopathy, unspecified: Secondary | ICD-10-CM | POA: Diagnosis present

## 2014-08-18 DIAGNOSIS — G4700 Insomnia, unspecified: Secondary | ICD-10-CM | POA: Diagnosis not present

## 2014-08-18 DIAGNOSIS — F419 Anxiety disorder, unspecified: Secondary | ICD-10-CM | POA: Diagnosis not present

## 2014-08-18 DIAGNOSIS — E669 Obesity, unspecified: Secondary | ICD-10-CM | POA: Diagnosis not present

## 2014-08-18 DIAGNOSIS — I480 Paroxysmal atrial fibrillation: Secondary | ICD-10-CM | POA: Diagnosis present

## 2014-08-18 DIAGNOSIS — R0600 Dyspnea, unspecified: Secondary | ICD-10-CM | POA: Diagnosis present

## 2014-08-18 DIAGNOSIS — E785 Hyperlipidemia, unspecified: Secondary | ICD-10-CM | POA: Diagnosis not present

## 2014-08-18 DIAGNOSIS — Z8701 Personal history of pneumonia (recurrent): Secondary | ICD-10-CM | POA: Diagnosis not present

## 2014-08-18 DIAGNOSIS — G8929 Other chronic pain: Secondary | ICD-10-CM | POA: Diagnosis not present

## 2014-08-18 DIAGNOSIS — R0603 Acute respiratory distress: Secondary | ICD-10-CM

## 2014-08-18 DIAGNOSIS — I639 Cerebral infarction, unspecified: Secondary | ICD-10-CM

## 2014-08-18 LAB — CBC WITH DIFFERENTIAL/PLATELET
BASOS PCT: 0 % (ref 0–1)
Basophils Absolute: 0 10*3/uL (ref 0.0–0.1)
EOS PCT: 0 % (ref 0–5)
Eosinophils Absolute: 0 10*3/uL (ref 0.0–0.7)
HEMATOCRIT: 33.6 % — AB (ref 36.0–46.0)
Hemoglobin: 10.4 g/dL — ABNORMAL LOW (ref 12.0–15.0)
LYMPHS ABS: 1 10*3/uL (ref 0.7–4.0)
Lymphocytes Relative: 8 % — ABNORMAL LOW (ref 12–46)
MCH: 27.7 pg (ref 26.0–34.0)
MCHC: 31 g/dL (ref 30.0–36.0)
MCV: 89.6 fL (ref 78.0–100.0)
Monocytes Absolute: 0.7 10*3/uL (ref 0.1–1.0)
Monocytes Relative: 6 % (ref 3–12)
NEUTROS PCT: 86 % — AB (ref 43–77)
Neutro Abs: 11 10*3/uL — ABNORMAL HIGH (ref 1.7–7.7)
Platelets: 234 10*3/uL (ref 150–400)
RBC: 3.75 MIL/uL — ABNORMAL LOW (ref 3.87–5.11)
RDW: 14.8 % (ref 11.5–15.5)
WBC: 12.8 10*3/uL — AB (ref 4.0–10.5)

## 2014-08-18 LAB — URINALYSIS, ROUTINE W REFLEX MICROSCOPIC
BILIRUBIN URINE: NEGATIVE
Glucose, UA: 250 mg/dL — AB
KETONES UR: NEGATIVE mg/dL
Leukocytes, UA: NEGATIVE
Nitrite: NEGATIVE
Protein, ur: 100 mg/dL — AB
SPECIFIC GRAVITY, URINE: 1.01 (ref 1.005–1.030)
Urobilinogen, UA: 0.2 mg/dL (ref 0.0–1.0)
pH: 7.5 (ref 5.0–8.0)

## 2014-08-18 LAB — COMPREHENSIVE METABOLIC PANEL
ALBUMIN: 3.9 g/dL (ref 3.5–5.0)
ALT: 11 U/L — AB (ref 14–54)
ANION GAP: 11 (ref 5–15)
AST: 21 U/L (ref 15–41)
Alkaline Phosphatase: 83 U/L (ref 38–126)
BUN: 11 mg/dL (ref 6–20)
CHLORIDE: 96 mmol/L — AB (ref 101–111)
CO2: 31 mmol/L (ref 22–32)
Calcium: 9.2 mg/dL (ref 8.9–10.3)
Creatinine, Ser: 0.89 mg/dL (ref 0.44–1.00)
GFR calc Af Amer: >60 mL/min (ref >60)
GFR, EST NON AFRICAN AMERICAN: 59 mL/min — AB (ref >60)
GLUCOSE: 250 mg/dL — AB (ref 65–99)
Potassium: 3.9 mmol/L (ref 3.5–5.1)
SODIUM: 138 mmol/L (ref 135–145)
Total Bilirubin: 1 mg/dL (ref 0.3–1.2)
Total Protein: 6.8 g/dL (ref 6.5–8.1)

## 2014-08-18 LAB — URINE MICROSCOPIC-ADD ON

## 2014-08-18 LAB — I-STAT CG4 LACTIC ACID, ED: Lactic Acid, Venous: 0.91 mmol/L (ref 0.5–2.0)

## 2014-08-18 LAB — I-STAT ARTERIAL BLOOD GAS, ED
Acid-Base Excess: 7 mmol/L — ABNORMAL HIGH (ref 0.0–2.0)
Bicarbonate: 32.7 mEq/L — ABNORMAL HIGH (ref 20.0–24.0)
O2 SAT: 99 %
Patient temperature: 98.6
TCO2: 34 mmol/L (ref 0–100)
pCO2 arterial: 52.1 mmHg — ABNORMAL HIGH (ref 35.0–45.0)
pH, Arterial: 7.407 (ref 7.350–7.450)
pO2, Arterial: 123 mmHg — ABNORMAL HIGH (ref 80.0–100.0)

## 2014-08-18 LAB — TSH: TSH: 0.976 u[IU]/mL (ref 0.350–4.500)

## 2014-08-18 LAB — MRSA PCR SCREENING: MRSA by PCR: POSITIVE — AB

## 2014-08-18 LAB — BRAIN NATRIURETIC PEPTIDE: B NATRIURETIC PEPTIDE 5: 1044.3 pg/mL — AB (ref 0.0–100.0)

## 2014-08-18 LAB — GLUCOSE, CAPILLARY
GLUCOSE-CAPILLARY: 249 mg/dL — AB (ref 65–99)
Glucose-Capillary: 206 mg/dL — ABNORMAL HIGH (ref 65–99)

## 2014-08-18 LAB — MAGNESIUM: Magnesium: 1.4 mg/dL — ABNORMAL LOW (ref 1.7–2.4)

## 2014-08-18 LAB — VITAMIN B12: Vitamin B-12: 125 pg/mL — ABNORMAL LOW (ref 180–914)

## 2014-08-18 MED ORDER — BISOPROLOL FUMARATE 5 MG PO TABS
10.0000 mg | ORAL_TABLET | Freq: Every day | ORAL | Status: DC
  Administered 2014-08-18 – 2014-08-21 (×4): 10 mg via ORAL
  Filled 2014-08-18 (×2): qty 2
  Filled 2014-08-18: qty 1
  Filled 2014-08-18: qty 2

## 2014-08-18 MED ORDER — GLIMEPIRIDE 1 MG PO TABS
2.0000 mg | ORAL_TABLET | Freq: Every day | ORAL | Status: DC
  Administered 2014-08-19: 2 mg via ORAL
  Filled 2014-08-18: qty 2

## 2014-08-18 MED ORDER — DILTIAZEM HCL 100 MG IV SOLR
5.0000 mg/h | INTRAVENOUS | Status: DC
  Administered 2014-08-18: 10 mg/h via INTRAVENOUS
  Administered 2014-08-18 – 2014-08-19 (×2): 5 mg/h via INTRAVENOUS

## 2014-08-18 MED ORDER — ACETAMINOPHEN 650 MG RE SUPP: 650.0000 mg | Freq: Four times a day (QID) | RECTAL | Status: DC | PRN

## 2014-08-18 MED ORDER — ROSUVASTATIN CALCIUM 10 MG PO TABS
5.0000 mg | ORAL_TABLET | Freq: Every day | ORAL | Status: DC
  Administered 2014-08-18 – 2014-08-23 (×6): 5 mg via ORAL
  Filled 2014-08-18 (×6): qty 1

## 2014-08-18 MED ORDER — ONDANSETRON HCL 4 MG/2ML IJ SOLN: 4.0000 mg | Freq: Four times a day (QID) | INTRAMUSCULAR | Status: DC | PRN

## 2014-08-18 MED ORDER — SODIUM CHLORIDE 0.9 % IJ SOLN
3.0000 mL | Freq: Two times a day (BID) | INTRAMUSCULAR | Status: DC
  Administered 2014-08-18 – 2014-08-24 (×11): 3 mL via INTRAVENOUS

## 2014-08-18 MED ORDER — INSULIN ASPART 100 UNIT/ML ~~LOC~~ SOLN
0.0000 [IU] | Freq: Three times a day (TID) | SUBCUTANEOUS | Status: DC
  Administered 2014-08-19: 3 [IU] via SUBCUTANEOUS
  Administered 2014-08-19: 5 [IU] via SUBCUTANEOUS
  Administered 2014-08-19: 3 [IU] via SUBCUTANEOUS
  Administered 2014-08-20 (×2): 5 [IU] via SUBCUTANEOUS
  Administered 2014-08-20: 3 [IU] via SUBCUTANEOUS
  Administered 2014-08-21: 5 [IU] via SUBCUTANEOUS
  Administered 2014-08-21: 3 [IU] via SUBCUTANEOUS
  Administered 2014-08-21 – 2014-08-22 (×3): 5 [IU] via SUBCUTANEOUS

## 2014-08-18 MED ORDER — PANTOPRAZOLE SODIUM 40 MG PO TBEC
40.0000 mg | DELAYED_RELEASE_TABLET | Freq: Every morning | ORAL | Status: DC
  Administered 2014-08-19: 40 mg via ORAL
  Filled 2014-08-18: qty 1

## 2014-08-18 MED ORDER — DILTIAZEM LOAD VIA INFUSION
20.0000 mg | Freq: Once | INTRAVENOUS | Status: AC
  Administered 2014-08-18: 20 mg via INTRAVENOUS
  Filled 2014-08-18: qty 20

## 2014-08-18 MED ORDER — FAMOTIDINE 20 MG PO TABS
20.0000 mg | ORAL_TABLET | Freq: Every day | ORAL | Status: DC
  Administered 2014-08-18 – 2014-08-24 (×7): 20 mg via ORAL
  Filled 2014-08-18 (×7): qty 1

## 2014-08-18 MED ORDER — ALBUTEROL SULFATE (2.5 MG/3ML) 0.083% IN NEBU: 2.5000 mg | INHALATION_SOLUTION | Freq: Four times a day (QID) | RESPIRATORY_TRACT | Status: DC | PRN

## 2014-08-18 MED ORDER — RIVAROXABAN 20 MG PO TABS
20.0000 mg | ORAL_TABLET | Freq: Every day | ORAL | Status: DC
  Administered 2014-08-18 – 2014-08-19 (×2): 20 mg via ORAL
  Filled 2014-08-18 (×2): qty 1

## 2014-08-18 MED ORDER — GABAPENTIN 300 MG PO CAPS
600.0000 mg | ORAL_CAPSULE | Freq: Two times a day (BID) | ORAL | Status: DC
  Administered 2014-08-18 – 2014-08-24 (×12): 600 mg via ORAL
  Filled 2014-08-18 (×12): qty 2

## 2014-08-18 MED ORDER — FUROSEMIDE 10 MG/ML IJ SOLN
40.0000 mg | Freq: Two times a day (BID) | INTRAMUSCULAR | Status: DC
  Administered 2014-08-18 – 2014-08-19 (×3): 40 mg via INTRAVENOUS
  Filled 2014-08-18 (×3): qty 4

## 2014-08-18 MED ORDER — ONDANSETRON HCL 4 MG/2ML IJ SOLN
4.0000 mg | Freq: Once | INTRAMUSCULAR | Status: AC
  Administered 2014-08-18: 4 mg via INTRAVENOUS
  Filled 2014-08-18: qty 2

## 2014-08-18 MED ORDER — ACETAMINOPHEN 325 MG PO TABS
650.0000 mg | ORAL_TABLET | Freq: Four times a day (QID) | ORAL | Status: DC | PRN
  Administered 2014-08-19 – 2014-08-20 (×2): 650 mg via ORAL
  Filled 2014-08-18 (×2): qty 2

## 2014-08-18 MED ORDER — SODIUM CHLORIDE 0.9 % IV BOLUS (SEPSIS)
1000.0000 mL | Freq: Once | INTRAVENOUS | Status: AC
  Administered 2014-08-18: 1000 mL via INTRAVENOUS

## 2014-08-18 MED ORDER — INSULIN ASPART 100 UNIT/ML ~~LOC~~ SOLN
0.0000 [IU] | Freq: Every day | SUBCUTANEOUS | Status: DC
  Administered 2014-08-18 – 2014-08-21 (×3): 2 [IU] via SUBCUTANEOUS

## 2014-08-18 MED ORDER — IPRATROPIUM-ALBUTEROL 0.5-2.5 (3) MG/3ML IN SOLN: 3.0000 mL | RESPIRATORY_TRACT | Status: DC | PRN

## 2014-08-18 MED ORDER — LOSARTAN POTASSIUM 50 MG PO TABS
50.0000 mg | ORAL_TABLET | Freq: Two times a day (BID) | ORAL | Status: DC
  Administered 2014-08-18 – 2014-08-20 (×4): 50 mg via ORAL
  Filled 2014-08-18 (×4): qty 1

## 2014-08-18 MED ORDER — VITAMIN D 1000 UNITS PO TABS
1000.0000 [IU] | ORAL_TABLET | Freq: Every day | ORAL | Status: DC
  Administered 2014-08-18 – 2014-08-24 (×7): 1000 [IU] via ORAL
  Filled 2014-08-18 (×7): qty 1

## 2014-08-18 MED ORDER — CALCIUM CITRATE 950 (200 CA) MG PO TABS
200.0000 mg | ORAL_TABLET | Freq: Every day | ORAL | Status: DC
  Administered 2014-08-19 – 2014-08-24 (×6): 200 mg via ORAL
  Filled 2014-08-18 (×7): qty 1

## 2014-08-18 MED ORDER — BUDESONIDE-FORMOTEROL FUMARATE 80-4.5 MCG/ACT IN AERO
2.0000 | INHALATION_SPRAY | Freq: Two times a day (BID) | RESPIRATORY_TRACT | Status: DC
  Administered 2014-08-19 – 2014-08-22 (×7): 2 via RESPIRATORY_TRACT
  Filled 2014-08-18: qty 6.9

## 2014-08-18 MED ORDER — NITROGLYCERIN 0.4 MG SL SUBL: 0.4000 mg | SUBLINGUAL_TABLET | SUBLINGUAL | Status: DC | PRN

## 2014-08-18 NOTE — ED Notes (Signed)
Pt to ED via  [facility]- pt brought in due to nephew calling EMS. Pt lives at home with demented husband. Pt family at bedside saying pt has had new onset confusion "for a few days where nothing would register." Pt is alert and oriented x4, however frequently spacing out. Pt upon EMS arrival, first responders said pt O2 sats "were low." Unable to tell what specifically they were. Pt on RA on arrival to ED 91%, on 2 L sats at 99%. Pt has hx of CHF.

## 2014-08-18 NOTE — ED Notes (Signed)
Spoke with pt son on the phone. Per son, he and his sister are HCPOA. Pt is a DNR. Also that pt is very noncompliant with medications such as her lasix. Also reported that 2 weeks ago, pt believed she had a UTI so "someone brought her antibiotics, she wasn't seen at the doctor."

## 2014-08-18 NOTE — ED Notes (Signed)
Pt experienced incontinence episode at current time. Before this pt has had 3 continent episodes with output totaling 400 cc. Clear, yellow urine.

## 2014-08-18 NOTE — Progress Notes (Signed)
16 Fr. Foley catheter inserted per MD orders using aseptic technique. No apparent urethral trauma on insertion; patient tolerated procedure well. Approximately 428ml pale, yellow urine returned to bag by gravity on insertion.

## 2014-08-18 NOTE — Progress Notes (Signed)
ANTICOAGULATION CONSULT NOTE - Initial Consult  Pharmacy Consult for rivaroxaban Indication: atrial fibrillation  Allergies  Allergen Reactions  . Codeine Nausea And Vomiting  . Demerol Other (See Comments)    Drunk feeling"  . Doxycycline Nausea And Vomiting  . Statins Other (See Comments)    "hurt"   . Zetia [Ezetimibe] Other (See Comments)    hurt    Patient Measurements:   Heparin Dosing Weight:   Vital Signs: Temp: 97.8 F (36.6 C) (08/05 1046) Temp Source: Oral (08/05 1046) BP: 131/76 mmHg (08/05 1600) Pulse Rate: 115 (08/05 1552)  Labs:  Recent Labs  08/18/14 1115  HGB 10.4*  HCT 33.6*  PLT 234  CREATININE 0.89    CrCl cannot be calculated (Unknown ideal weight.).   Medical History: Past Medical History  Diagnosis Date  . Mild aortic stenosis     a. 10/2010 Echo: Ef 60-65%, no rwma, mild LVH no AS.  Marland Kitchen Chronic back pain   . Peripheral neuropathy   . GERD (gastroesophageal reflux disease)   . Hypertension   . History of pneumonia   . History of bronchitis   . Basal cell carcinoma     a. 2013.  . Non-cardiac chest pain     LHC w/ nl coronaries and nl EF 60% -12/2013  . PAC (premature atrial contraction)   . Heart murmur   . Type II diabetes mellitus     TYPE 2    Medications:  See EMR  Assessment: 81 yo female admitted with hypoxia and mental status changes. Found to be in AFib, new diagnosis for the pt. She was not on any anticoagulants pta. CHADS2VASC =6. CBC stable and wnl, SCr 0.89, eCrCl 50-55 ml/min.  Goal of Therapy:  Monitor platelets by anticoagulation protocol: Yes   Plan:  -Rivaroxaban 20 mg po daily with evening meal -CBC q72h -Monitor s/sx bleeding -Monitor renal fx, close to requiring dose adjustment    Hughes Better, PharmD, BCPS Clinical Pharmacist Pager: [phone] 08/18/2014 4:35 PM

## 2014-08-18 NOTE — ED Provider Notes (Signed)
History   Chief Complaint  Patient presents with  . Shortness of Breath    HPI 81 year old female who is followed closely by Dr. Mare Ferrari for unstable angina, paroxysmal A. fib, history of hypertension, diabetes, mild aortic stenosis who presents to ED for evaluation of shortness of breath and confusion. Patient is accompanied by her sister is also providing history today segmented of patient's confusion. Sr. states that for the past 5 days patient has been slightly more confused than normal. She also reports that she's been recently treated for UTI but did not take her in a box as instructed secondary to nausea. She denies any fevers, chills, abdominal pain, chest pain, nausea, vomiting, diarrhea, leg pain. Patient reports over the past couple weeks she has had increased leg swelling and shortness of breath at night when lying flat.  Additionally, patient was recently seen by pulmonology in June for COPD. No O2 at baseline. At that time patient had 2 week event monitor which was negative for A. fib but did show frequent PACs.  Past medical/surgical history, social history, medications, allergies and FH have been reviewed with patient and/or in documentation. Furthermore, if pt family or friend(s) present, additional historical information was obtained from them.  Past Medical History  Diagnosis Date  . Mild aortic stenosis     a. 10/2010 Echo: Ef 60-65%, no rwma, mild LVH no AS.  Marland Kitchen Chronic back pain   . Peripheral neuropathy   . GERD (gastroesophageal reflux disease)   . Hypertension   . History of pneumonia   . History of bronchitis   . Basal cell carcinoma     a. 2013.  . Non-cardiac chest pain     LHC w/ nl coronaries and nl EF 60% -12/2013  . PAC (premature atrial contraction)   . Heart murmur   . Type II diabetes mellitus     TYPE 2   Past Surgical History  Procedure Laterality Date  . Vesicovaginal fistula closure w/ tah  1964  . US echocardiography  04-26-09    EF  55-60%  . Cardiovascular stress test  05-13-2005    EF 67%  . Knee surgery    . Joint replacement  2000    left knee  . Abdominal hysterectomy  1964  . Toe shortened      right 2nd toe  . Cataract extraction      both eyes  . Mass excision  01/13/2012    Procedure: EXCISION MASS;  Surgeon: Ralene Ok, MD;  Location: [facility];  Service: General;  Laterality: Right;  Excision of Right Back Mass  . Left heart catheterization with coronary angiogram N/A 01/03/2014    Procedure: LEFT HEART CATHETERIZATION WITH CORONARY ANGIOGRAM;  Surgeon: Lorretta Harp, MD;  Location: [facility] CATH LAB;  Service: Cardiovascular;  Laterality: N/A;  . Cardiac catheterization    . Eye surgery      cataract   Family History  Problem Relation Age of Onset  . Other Mother     died @ 83, complications following childbirth  . COPD Father     died @ 31  . Heart failure Father   . Irregular heart beat Sister     1 sister with PPM.   History  Substance Use Topics  . Smoking status: Former Smoker -- 0.50 packs/day for 5 years    Types: Cigarettes    Quit date: 01/14/1980  . Smokeless tobacco: Never Used  . Alcohol Use: No     Review of  Systems Unable to obtain 2/2 pt confusion   Physical Exam  Physical Exam  ED Triage Vitals  Enc Vitals Group     BP 08/18/14 1046 157/127 mmHg     Pulse Rate 08/18/14 1046 110     Resp 08/18/14 1046 18     Temp 08/18/14 1046 97.8 F (36.6 C)     Temp Source 08/18/14 1046 Oral     SpO2 08/18/14 1034 99 %     Weight --      Height --      Head Cir --      Peak Flow --      Pain Score 08/18/14 1057 0     Pain Loc --      Pain Edu? --      Excl. in [place]? --    Constitutional: Chronically ill, deconditioned, obese elderly female in mild respiratory distress. Head: Normocephalic and atraumatic.  Eyes: Extraocular motion intact, no scleral icterus Mouth: MMM, OP clear Neck: Supple without meningismus, mass, or overt JVD Respiratory: Mild respiratory distress  crackles at bases. CV: Tachy, irreg rhythm, no obvious murmurs.  Pulses +2 and symmetric. Mild BLE edema, nonpitting. Abdomen: Soft, NT, ND, no r/g. No mass.  MSK: Extremities are atraumatic without deformity, ROM intact Skin: Warm, dry, intact without rash Neuro: AAOx3, MAE 5/5 sym, no focal deficit noted   ED Course  Procedures   Labs Reviewed  URINALYSIS, ROUTINE W REFLEX MICROSCOPIC (NOT AT [facility]) - Abnormal; Notable for the following:    Glucose, UA 250 (*)    Hgb urine dipstick TRACE (*)    Protein, ur 100 (*)    All other components within normal limits  COMPREHENSIVE METABOLIC PANEL - Abnormal; Notable for the following:    Chloride 96 (*)    Glucose, Bld 250 (*)    ALT 11 (*)    GFR calc non Af Amer 59 (*)    All other components within normal limits  CBC WITH DIFFERENTIAL/PLATELET - Abnormal; Notable for the following:    WBC 12.8 (*)    RBC 3.75 (*)    Hemoglobin 10.4 (*)    HCT 33.6 (*)    Neutrophils Relative % 86 (*)    Neutro Abs 11.0 (*)    Lymphocytes Relative 8 (*)    All other components within normal limits  BRAIN NATRIURETIC PEPTIDE - Abnormal; Notable for the following:    B Natriuretic Peptide 1044.3 (*)    All other components within normal limits  I-STAT ARTERIAL BLOOD GAS, ED - Abnormal; Notable for the following:    pCO2 arterial 52.1 (*)    pO2, Arterial 123.0 (*)    Bicarbonate 32.7 (*)    Acid-Base Excess 7.0 (*)    All other components within normal limits  URINE CULTURE  URINE MICROSCOPIC-ADD ON  BLOOD GAS, ARTERIAL  TSH  VITAMIN B12  MAGNESIUM  I-STAT CG4 LACTIC ACID, ED  I-STAT CG4 LACTIC ACID, ED   I personally reviewed and interpreted all labs.  Dg Chest Port 1 View  08/18/2014   CLINICAL DATA:  Shortness of breath today with exertion.  EXAM: PORTABLE CHEST - 1 VIEW  COMPARISON:  03/31/2014  FINDINGS: Heart is borderline in size. No confluent airspace opacities, effusions or edema. No acute bony abnormality.  IMPRESSION: No  active disease.   Electronically Signed   By: Rolm Baptise M.D.   On: 08/18/2014 11:39   I personally viewed above image(s) which were used in my medical  decision making. Formal interpretations by Radiology.   EKG Interpretation  Date/Time:  Friday August 18 2014 10:46:19 EDT Ventricular Rate:  114 PR Interval:  218 QRS Duration: 80 QT Interval:  356 QTC Calculation: 490 R Axis:   68 Text Interpretation:  Sinus tachycardia with irregular rate Prolonged PR interval Baseline wander in lead(s) V6 No significant change since last tracing Confirmed by Debby Freiberg [phone]) on 08/18/2014 10:52:25 AM       MDM: Greer Ee is a 81 y.o. female with H&P as above who p/w CC: Shortness of breath  On arrival, patient is confused, and complaining of shortness of breath but is hemodynamically stable and in no apparent distress. Patient is benign neuro exam. Patient crackles or bases. Patient is in A. fib and heart rate is 90s to 100s. No signs of acute ischemia.  During examination, patient's heart rate shot up to 140-150. Repeat EKG shows A. fib RVR. Bolus and drip started. Screening labs sent, chest x-ray ordered.  Epic records reviewed and patient had recent appointment with pulmonology at which point she also had recent event monitor negative for A. Fib during those 2 wks but did show frequent PACs.  While on diltiazem, patient blood pressure stable and heart rate is improved. -Re-evaluation: Patient continues to remain hemodynamically stable and in no apparent distress.  Patient will be admitted for A. fib RVR, and likely new CHF. Old records reviewed (if available). Labs and imaging reviewed personally by myself and considered in medical decision making if ordered. Clinical Impression: 1. Atrial fibrillation with RVR   2. Respiratory distress   3. Congestive heart failure, unspecified congestive heart failure chronicity, unspecified congestive heart failure type     Disposition:  Admit  Condition: stable  I have discussed the results, Dx and Tx plan with the pt(& family if present). He/she/they expressed understanding and agree(s) with the plan.  Pt seen in conjunction with Dr. Lawerance Cruel, [facility] Resident - PGY-3    Kirstie Peri, MD 08/18/14 [id number]  Debby Freiberg, MD 08/18/14 [phone]

## 2014-08-18 NOTE — ED Notes (Signed)
Upon entering room pt HR @ 160's - pt resting in bed. No complaints. MD Colin Rhein at bedside. Second IV being placed at current time by Seward Grater RN.

## 2014-08-18 NOTE — H&P (Addendum)
[facility] History and Physical  SHIRELLE TOOTLE XJO:[id number] DOB: 02/10/1930 DOA: 08/18/2014  Referring physician: ED PCP: Cari Caraway, MD  Primary cardiologist: Dr. Mare Ferrari  Chief Complaint:  Hypoxia and acute mental status change  HPI:  81 year old obese female with history of hypertension, OPD cholestasis 2, peripheral neuropathy, diastolic CHF, diabetes mellitus type 2, mild aortic stenosis, cardiac cath done in December 2015 with normal coronaries and EF of 60%, premature atrial contractions who lives at home with her husband was brought to the ED by EMS as patient reportedly was confused for the last few days and also had not taken her medications since yesterday. EMS found her to be hypoxic (no documentation of her O2 sat) and when brought to the ED she was satting at 91% on room air and improved to 99% on 2 L nasal cannula. Patient was alert and oriented during my evaluation and says she does not remember some of the events yesterday. She reports that normally she is compliant with her medications. At baseline she is able to ambulate in and around the house without any difficulty. She denies any recent illness or change in her medications. Patient denies headache, dizziness, fever, chills, nausea , vomiting, chest pain, palpitations, SOB, abdominal pain, bowel or urinary symptoms. Denies change in weight or appetite.  Course in the ED Patient was afebrile but found to be in rapid A. fib. Blood work done showed mild leukocytosis, hemoglobin of 10.4 and hematocrit 32.6. Chemistry showed normal sodium and potassium, normal anion gap, normal renal function, blood glucose of 250. EKG showing rapid A. fib at 151 with EDC is. Chest x-ray unremarkable. ProBNP of 1044. Patient started on Cardizem drip and hospitalists admission requested to stepdown unit.   Review of Systems:  Constitutional: Denies fever, chills, diaphoresis, appetite change and fatigue.  HEENT: Denies visual or  hearing symptoms, congestion, sore throat, difficulty swallowing, neck pain or stiffness Respiratory: Denies SOB, DOE, cough, chest tightness,  and wheezing.   Cardiovascular: Denies chest pain, palpitations and leg swelling.  Gastrointestinal: Denies nausea, vomiting, abdominal pain, diarrhea, constipation, blood in stool and abdominal distention.  Genitourinary: Denies dysuria, urgency, frequency, hematuria, flank pain and difficulty urinating.  Endocrine: Denies: hot or cold intolerance,  polyuria, polydipsia. Musculoskeletal: Denies myalgias, back pain, joint a nor swelling. Skin: Denies pallor, rash and wound.  Neurological: Denies dizziness, seizures, syncope, weakness, light-headedness, numbness and headaches.  Hematological: Denies adenopathy.  Psychiatric/Behavioral: Confusion+, denies  nervousness, sleep disturbance and agitation   Past Medical History  Diagnosis Date  . Mild aortic stenosis     a. 10/2010 Echo: Ef 60-65%, no rwma, mild LVH no AS.  Marland Kitchen Chronic back pain   . Peripheral neuropathy   . GERD (gastroesophageal reflux disease)   . Hypertension   . History of pneumonia   . History of bronchitis   . Basal cell carcinoma     a. 2013.  . Non-cardiac chest pain     LHC w/ nl coronaries and nl EF 60% -12/2013  . PAC (premature atrial contraction)   . Heart murmur   . Type II diabetes mellitus     TYPE 2   Past Surgical History  Procedure Laterality Date  . Vesicovaginal fistula closure w/ tah  1964  . US echocardiography  04-26-09    EF 55-60%  . Cardiovascular stress test  05-13-2005    EF 67%  . Knee surgery    . Joint replacement  2000    left knee  . Abdominal  hysterectomy  1964  . Toe shortened      right 2nd toe  . Cataract extraction      both eyes  . Mass excision  01/13/2012    Procedure: EXCISION MASS;  Surgeon: Ralene Ok, MD;  Location: [facility];  Service: General;  Laterality: Right;  Excision of Right Back Mass  . Left heart catheterization  with coronary angiogram N/A 01/03/2014    Procedure: LEFT HEART CATHETERIZATION WITH CORONARY ANGIOGRAM;  Surgeon: Lorretta Harp, MD;  Location: [facility] CATH LAB;  Service: Cardiovascular;  Laterality: N/A;  . Cardiac catheterization    . Eye surgery      cataract   Social History:  reports that she quit smoking about 34 years ago. Her smoking use included Cigarettes. She has a 2.5 pack-year smoking history. She has never used smokeless tobacco. She reports that she does not drink alcohol or use illicit drugs.  Allergies  Allergen Reactions  . Codeine Nausea And Vomiting  . Demerol Other (See Comments)    Drunk feeling"  . Doxycycline Nausea And Vomiting  . Statins Other (See Comments)    "hurt"   . Zetia [Ezetimibe] Other (See Comments)    hurt    Family History  Problem Relation Age of Onset  . Other Mother     died @ 54, complications following childbirth  . COPD Father     died @ 32  . Heart failure Father   . Irregular heart beat Sister     1 sister with PPM.    Prior to Admission medications   Medication Sig Start Date End Date Taking? Authorizing Provider  albuterol (PROVENTIL HFA;VENTOLIN HFA) 108 (90 BASE) MCG/ACT inhaler Inhale 1 puff into the lungs every 6 (six) hours as needed for wheezing or shortness of breath.    Historical Provider, MD  aspirin 81 MG tablet Take 81 mg by mouth every Monday, Wednesday, and Friday. Take 1 on Monday, Wednesday, and Friday only    Historical Provider, MD  BIOTIN PO Take 1 tablet by mouth daily.    Historical Provider, MD  bisoprolol (ZEBETA) 5 MG tablet Take 1 tablet (5 mg total) by mouth daily. 06/30/14   Tanda Rockers, MD  budesonide-formoterol [facility]) 80-4.5 MCG/ACT inhaler Inhale 2 puffs into the lungs 2 (two) times daily. 04/12/14   Tanda Rockers, MD  calcium citrate (CALCITRATE - DOSED IN MG ELEMENTAL CALCIUM) 950 MG tablet Take 200 mg of elemental calcium by mouth daily.    Historical Provider, MD  cholecalciferol (VITAMIN  D) 1000 UNITS tablet Take 1,000 Units by mouth daily.    Historical Provider, MD  famotidine (PEPCID) 20 MG tablet Take 20 mg by mouth daily.    Historical Provider, MD  furosemide (LASIX) 20 MG tablet Take 20 mg by mouth as needed for fluid (Left foot swelling).    Historical Provider, MD  gabapentin (NEURONTIN) 300 MG capsule Take 2 capsules (600 mg total) by mouth 2 (two) times daily. 02/05/14   Orson Eva, MD  glimepiride (AMARYL) 2 MG tablet Take 2 mg by mouth daily with breakfast.    Historical Provider, MD  ipratropium-albuterol (DUONEB) 0.5-2.5 (3) MG/3ML SOLN Take 3 mLs by nebulization every 6 (six) hours as needed.     Historical Provider, MD  losartan (COZAAR) 50 MG tablet Take 50 mg by mouth 2 (two) times daily. 1/2 tablet by mouth daily 03/23/14   Historical Provider, MD  metFORMIN (GLUCOPHAGE) 1000 MG tablet Take 1 tablet (1,000 mg  total) by mouth 2 (two) times daily with a meal. 01/06/14   Brittainy Erie Noe, PA-C  nitroGLYCERIN (NITROSTAT) 0.4 MG SL tablet Place 1 tablet (0.4 mg total) under the tongue every 5 (five) minutes as needed for chest pain. 01/02/14   Darlin Coco, MD  pantoprazole (PROTONIX) 40 MG tablet Take 40 mg by mouth every morning.     Historical Provider, MD  rosuvastatin (CRESTOR) 5 MG tablet Take 5 mg by mouth at bedtime.    Historical Provider, MD  Spacer/Aero-Holding Chambers (AEROCHAMBER MV) inhaler Use as instructed 03/14/14   Tanda Rockers, MD  traMADol (ULTRAM) 50 MG tablet Take 2 tablets (100 mg total) by mouth daily as needed. 05/25/14   Charlett Blake, MD  triamcinolone (KENALOG) 0.025 % cream Apply 1 application topically as needed (as needed for leg pain).  08/08/13   Historical Provider, MD     Physical Exam:  Filed Vitals:   08/18/14 1145 08/18/14 1200 08/18/14 1215 08/18/14 1228  BP: 158/86 137/52 146/89   Pulse:      Temp:      TempSrc:      Resp:  27 26   SpO2:    100%    Constitutional: Vital signs reviewed.  Elderly obese female  appears mildly tachypneic HEENT: no pallor, no icterus, moist oral mucosa, no cervical lymphadenopathy, no JVD Cardiovascular: S1 and S2 irregularly irregular, no murmurs rub or gallop Chest: Diminished bibasilar breath sounds, no rhonchi or wheeze or crackles Abdominal: Soft. Non-tender, non-distended, bowel sounds are normal,  GU: no CVA tenderness Ext: warm, trace edema bilaterally  Neurological: A&O x3, no tremors, nonfocal  Labs on Admission:  Basic Metabolic Panel:  Recent Labs Lab 08/18/14 1115  NA 138  K 3.9  CL 96*  CO2 31  GLUCOSE 250*  BUN 11  CREATININE 0.89  CALCIUM 9.2   Liver Function Tests:  Recent Labs Lab 08/18/14 1115  AST 21  ALT 11*  ALKPHOS 83  BILITOT 1.0  PROT 6.8  ALBUMIN 3.9   No results for input(s): LIPASE, AMYLASE in the last 168 hours. No results for input(s): AMMONIA in the last 168 hours. CBC:  Recent Labs Lab 08/18/14 1115  WBC 12.8*  NEUTROABS 11.0*  HGB 10.4*  HCT 33.6*  MCV 89.6  PLT 234   Cardiac Enzymes: No results for input(s): CKTOTAL, CKMB, CKMBINDEX, TROPONINI in the last 168 hours. BNP: Invalid input(s): POCBNP CBG: No results for input(s): GLUCAP in the last 168 hours.  Radiological Exams on Admission: Dg Chest Port 1 View  08/18/2014   CLINICAL DATA:  Shortness of breath today with exertion.  EXAM: PORTABLE CHEST - 1 VIEW  COMPARISON:  03/31/2014  FINDINGS: Heart is borderline in size. No confluent airspace opacities, effusions or edema. No acute bony abnormality.  IMPRESSION: No active disease.   Electronically Signed   By: Rolm Baptise M.D.   On: 08/18/2014 11:39    EKG: afib with RVR at 151, PVCs, no ST-T changes  Assessment/Plan  Principal Problem:   Atrial fibrillation with RVR -Admit to stepdown. Started on IV Cardizem drip on admission. No clear trigger for her symptoms. No clear signs of infection, blood pressure is stable. -Previously evaluated for possible A. fib but looked like she had PACs  only. Also had a cardiac event  Monitor in 2015 which showed dysrhythmia only. -Patient has mild aortic stenosis on prior echo and cardiac catheter in 2015. -repeat a 2-D echo. Check TSH. Cardiology consulted. -Her CHADS  2 vasc score is 5 and does qualify for anticoagulation.   Active Problems: Mild acute on chronic diastolic CHF Patient has trace bilateral pitting edema, diminished lung sounds and mildly elevated BNP. I would place her on IV Lasix 40 mg twice daily. Monitor strict I/O and daily weight. Continue aspirin, beta blocker and statin which is continued.   ?   Acute encephalopathy No clear etiology. No clinical signs of infection. ( chest x-ray UA negative).  Mental status appears at baseline at this time. ABG done showed hypercapnia is might he continued routine. Check TSH, B12 . Will monitor for now.    Benign hypertensive heart disease without heart failure Blood pressure stable. Continue losartan, bisoprolol    Aortic stenosis, mild Follow-up with repeat echo.    Diabetic peripheral neuropathy associated with type 2 diabetes mellitus Continue Neurontin.    Essential hypertension Continue home medications    COPD GOLD II  No acute symptoms. Will resume home inhalers. Place on when necessary nebs, not on home O2. Follows with Dr. Melvyn Novas as outpatient   Diabetes mellitus Hold metformin. Check A1C, monitor fsg. Place on SSI  Diet:cardiac  DVT prophylaxis: sq lovenox   Code Status: DNR Family Communication: discussed with patient and her sister at bedside Disposition Plan: Admit to stepdown  Louellen Molder [facility] Pager [phone]  Total time spent on admission :70 minutes  If 7PM-7AM, please contact night-coverage [email or web address] Password [email or web address] 08/18/2014, 1:53 PM

## 2014-08-18 NOTE — Consult Note (Signed)
CARDIOLOGY CONSULT NOTE   Patient ID: [name] [name] MRN: [id number] DOB/AGE: June 15, 1930 81 y.o.  Admit date: 08/18/2014  Primary Physician   MCNEILL,WENDY, MD Primary Cardiologist   Dr Mare Ferrari Reason for Consultation   Atrial fib  HPI:[name] [name] is a 81 y.o. year old female with a history of hypertension, cholestasis 2, peripheral neuropathy, diastolic CHF, diabetes mellitus type 2, mild aortic stenosis and normal cors by cath 12/2013. She has a history of PACs but not atrial fib.   She was brought to the ER by EMS for [name] changes and hypoxia. She was in rapid atrial fibrillation and cardiology was asked to evaluate her.   [name] [name] is oriented to name and place, but not date. She has trouble recalling events. She cannot say how long she has been in atrial fib. She denies palpitations or chest pain. She normally walks with a cane and her activity level is poor at baseline. On a good day, she cannot walk a block or do steps.  She cannot say how long her her SOB has been getting worse, does not remember PND or orthopnea. According to her sister, [name] [name] has been getting gradually worse over the last 9 months or so, worse in the last week or so.   Past Medical History  Diagnosis Date  . Mild aortic stenosis     a. 10/2010 Echo: Ef 60-65%, no rwma, mild LVH no AS.  Marland Kitchen Chronic back pain   . Peripheral neuropathy   . GERD (gastroesophageal reflux disease)   . Hypertension   . History of pneumonia   . History of bronchitis   . Basal cell carcinoma     a. 2013.  . Non-cardiac chest pain     LHC w/ nl coronaries and nl EF 60% -12/2013  . PAC (premature atrial contraction)   . Heart murmur   . Type II diabetes mellitus     TYPE 2     Past Surgical History  Procedure Laterality Date  . Vesicovaginal fistula closure w/ tah  1964  . US echocardiography  04-26-09    EF 55-60%  . Cardiovascular stress test  05-13-2005    EF 67%  . Knee surgery    . Joint replacement   2000    left knee  . Abdominal hysterectomy  1964  . Toe shortened      right 2nd toe  . Cataract extraction      both eyes  . Mass excision  01/13/2012    Procedure: EXCISION MASS;  Surgeon: Ralene Ok, MD;  Location: [facility];  Service: General;  Laterality: Right;  Excision of Right Back Mass  . Left heart catheterization with coronary angiogram N/A 01/03/2014    Procedure: LEFT HEART CATHETERIZATION WITH CORONARY ANGIOGRAM;  Surgeon: Lorretta Harp, MD;  Location: [facility] CATH LAB;  Service: Cardiovascular;  Laterality: N/A;  . Cardiac catheterization    . Eye surgery      cataract    Allergies  Allergen Reactions  . Codeine Nausea And Vomiting  . Demerol Other (See Comments)    Drunk feeling"  . Doxycycline Nausea And Vomiting  . Statins Other (See Comments)    "hurt"   . Zetia [Ezetimibe] Other (See Comments)    hurt    I have reviewed the patient's current medications   . diltiazem (CARDIZEM) infusion 15 mg/hr (08/18/14 1334)   Medication Sig  albuterol (PROVENTIL HFA;VENTOLIN HFA) 108 (90 BASE) MCG/ACT inhaler Inhale  1 puff into the lungs every 6 (six) hours as needed for wheezing or shortness of breath.  aspirin 81 MG tablet Take 81 mg by mouth every Monday, Wednesday, and Friday. Take 1 on Monday, Wednesday, and Friday only  BIOTIN PO Take 1 tablet by mouth daily.  bisoprolol (ZEBETA) 5 MG tablet Take 1 tablet (5 mg total) by mouth daily.  budesonide-formoterol (SYMBICORT) 80-4.5 MCG/ACT inhaler Inhale 2 puffs into the lungs 2 (two) times daily.  calcium citrate (CALCITRATE - DOSED IN MG ELEMENTAL CALCIUM) 950 MG tablet Take 200 mg of elemental calcium by mouth daily.  cholecalciferol (VITAMIN D) 1000 UNITS tablet Take 1,000 Units by mouth daily.  famotidine (PEPCID) 20 MG tablet Take 20 mg by mouth daily.  furosemide (LASIX) 20 MG tablet Take 20 mg by mouth as needed for fluid (Left foot swelling).  gabapentin (NEURONTIN) 300 MG capsule Take 2 capsules (600 mg  total) by mouth 2 (two) times daily.  glimepiride (AMARYL) 2 MG tablet Take 2 mg by mouth daily with breakfast.  ipratropium-albuterol (DUONEB) 0.5-2.5 (3) MG/3ML SOLN Take 3 mLs by nebulization every 6 (six) hours as needed.   losartan (COZAAR) 50 MG tablet Take 50 mg by mouth 2 (two) times daily. 1/2 tablet by mouth daily  metFORMIN (GLUCOPHAGE) 1000 MG tablet Take 1 tablet (1,000 mg total) by mouth 2 (two) times daily with a meal.  pantoprazole (PROTONIX) 40 MG tablet Take 40 mg by mouth every morning.   rosuvastatin (CRESTOR) 5 MG tablet Take 5 mg by mouth at bedtime.  traMADol (ULTRAM) 50 MG tablet Take 2 tablets (100 mg total) by mouth daily as needed.  nitroGLYCERIN (NITROSTAT) 0.4 MG SL tablet Place 1 tablet (0.4 mg total) under the tongue every 5 (five) minutes as needed for chest pain.  Spacer/Aero-Holding Chambers (AEROCHAMBER MV) inhaler Use as instructed     History   Social History  . Marital Status: Married    Spouse Name: N/A  . Number of Children: N/A  . Years of Education: N/A   Occupational History  . Retired    Social History Main Topics  . Smoking status: Former Smoker -- 0.50 packs/day for 5 years    Types: Cigarettes    Quit date: 01/14/1980  . Smokeless tobacco: Never Used  . Alcohol Use: No  . Drug Use: No  . Sexual Activity: Not on file   Other Topics Concern  . Not on file   Social History Narrative   Lives with Husband in [place].  Has a dtr in [place], [place] and a son in [place], [place].    Family Status  Relation Status Death Age  . Mother Deceased 33    Childbirth  . Father Deceased 22    Heart Failure, emphysema  . Sister Deceased     breast cancer/ heart failure  . Sister Deceased     aneurysm   Family History  Problem Relation Age of Onset  . Other Mother     died @ 53, complications following childbirth  . COPD Father     died @ 52  . Heart failure Father   . Irregular heart beat Sister     1 sister with PPM.     ROS:  Full  review of systems not obtainable due to pt condition.  Physical Exam: Blood pressure 123/90, pulse 115, temperature 97.8 F (36.6 C), temperature source Oral, resp. rate 25, SpO2 100 %.  General: Well developed, obese, elderly, female in moderate respiratory distress Head:  Eyes PERRLA, No xanthomas.   Normocephalic and atraumatic, oropharynx without edema or exudate. Dentition: moderate Lungs: decreased BS bases, few wheezes Heart: Heart irregular rate and rhythm with S1, S2  murmur. pulses are 2+ extrem.   Neck: No carotid bruits. No lymphadenopathy.  JVD difficult to assess 2nd body habitus. Abdomen: Bowel sounds present, abdomen soft and non-tender without masses or hernias noted. Msk:  No spine or cva tenderness. No unilateral weakness, no joint deformities or effusions. Extremities: No clubbing or cyanosis.  edema.  Neuro: Alert and oriented X 3. No focal deficits noted. Psych:  Good affect, responds appropriately Skin: No rashes or lesions noted.  Labs:   Lab Results  Component Value Date   WBC 12.8* 08/18/2014   HGB 10.4* 08/18/2014   HCT 33.6* 08/18/2014   MCV 89.6 08/18/2014   PLT 234 08/18/2014     Recent Labs Lab 08/18/14 1115  NA 138  K 3.9  CL 96*  CO2 31  BUN 11  CREATININE 0.89  CALCIUM 9.2  PROT 6.8  BILITOT 1.0  ALKPHOS 83  ALT 11*  AST 21  GLUCOSE 250*  ALBUMIN 3.9   B NATRIURETIC PEPTIDE  Date/Time Value Ref Range Status  08/18/2014 11:15 AM 1044.3* 0.0 - 100.0 pg/mL Final  02/01/2014 05:30 PM 85.8 0.0 - 100.0 pg/mL Final    Comment:    Please note change in reference range.   TSH  Date/Time Value Ref Range Status  08/18/2014 02:11 PM 0.976 0.350 - 4.500 uIU/mL Final  01/02/2014 03:40 PM 0.99 0.35 - 4.50 uIU/mL Final   Echo: 10/18/2010 Study Conclusions Left ventricle: The cavity size was normal. Wall thickness was increased in a pattern of mild LVH. Systolic function was normal. The estimated ejection fraction was in the range of 60%  to 65%. Wall motion was normal; there were no regional wall motion abnormalities.  ECG: 08/18/2014 Atrial fib, RVR  Radiology:  Dg Chest Port 1 View 08/18/2014   CLINICAL DATA:  Shortness of breath today with exertion.  EXAM: PORTABLE CHEST - 1 VIEW  COMPARISON:  03/31/2014  FINDINGS: Heart is borderline in size. No confluent airspace opacities, effusions or edema. No acute bony abnormality.  IMPRESSION: No active disease.   Electronically Signed   By: Rolm Baptise M.D.   On: 08/18/2014 11:39    ASSESSMENT AND PLAN:   The patient was seen today by Dr Ellyn Hack, the patient evaluated and the data reviewed.  Principal Problem:   Atrial fibrillation with RVR - new diagnosis - agree with rate control with IV Cardizem, change to PO once condition stabilized - her TSH is normal - will need to try to maintain SR, so will call for TEE/DCCV on Monday - Will resume bisoprolol but increase to 10 mg daily    Anticoagulation - This patients CHA2DS2-VASc Score and unadjusted Ischemic Stroke Rate (% per year) is equal to 9.7 % stroke rate/year from a score of 6 Above score calculated as 1 point each if present [CHF, HTN, DM, Vascular=MI/PAD/Aortic Plaque, Age if 65-74, or Female], 2 points each if present [Age > 75, or Stroke/TIA/TE] - she is currently on DVT Lovenox, is a candidate for anticoagulation - will change to full dose anticoagulation with Xarelto  Otherwise, per IM. Echo and IV Lasix 40 mg IV BID have been ordered by primary MD. Consider Foley cath.  Concern for CVA from unknown duration afib, may need CT and /or Neuro eval. Active Problems:   Benign hypertensive heart disease without  heart failure   Aortic stenosis, mild   Diabetic peripheral neuropathy associated with type 2 diabetes mellitus   Essential hypertension   COPD GOLD II    Acute encephalopathy  Signed: Rosaria Ferries, PA-C 08/18/2014 4:14 PM Beeper [phone]  Co-Sign MD   I have seen, examined and evaluated the patient  this PM along with [name]. Ahmed Prima, PA-C.  After reviewing all the available data and chart,  I agree with her findings, examination as well as impression recommendations.  We discussed her history & findings - the above plan is per our discussion.  Likely Acute CHF (at least DHF given mild AS) 2/2 new onset Afib with RVR,. Rate control with CCB, AC with Xarelto - if remians in Afib by Monday,consider TEE/DCCV  We will follow along - will likely not need ischemic eval with recent cath showing no significant CAD.  Leonie Man, M.D., M.S. Interventional Cardiologist   Pager # [phone]

## 2014-08-19 ENCOUNTER — Inpatient Hospital Stay (HOSPITAL_COMMUNITY): Payer: Medicare HMO

## 2014-08-19 DIAGNOSIS — I4891 Unspecified atrial fibrillation: Secondary | ICD-10-CM

## 2014-08-19 LAB — BASIC METABOLIC PANEL
Anion gap: 9 (ref 5–15)
BUN: 9 mg/dL (ref 6–20)
CHLORIDE: 96 mmol/L — AB (ref 101–111)
CO2: 33 mmol/L — AB (ref 22–32)
Calcium: 8.6 mg/dL — ABNORMAL LOW (ref 8.9–10.3)
Creatinine, Ser: 0.99 mg/dL (ref 0.44–1.00)
GFR calc non Af Amer: 52 mL/min — ABNORMAL LOW (ref >60)
GFR, EST AFRICAN AMERICAN: 60 mL/min — AB (ref >60)
GLUCOSE: 228 mg/dL — AB (ref 65–99)
Potassium: 3.7 mmol/L (ref 3.5–5.1)
Sodium: 138 mmol/L (ref 135–145)

## 2014-08-19 LAB — CBC
HEMATOCRIT: 33.9 % — AB (ref 36.0–46.0)
Hemoglobin: 10.3 g/dL — ABNORMAL LOW (ref 12.0–15.0)
MCH: 27.1 pg (ref 26.0–34.0)
MCHC: 30.4 g/dL (ref 30.0–36.0)
MCV: 89.2 fL (ref 78.0–100.0)
Platelets: 256 10*3/uL (ref 150–400)
RBC: 3.8 MIL/uL — AB (ref 3.87–5.11)
RDW: 14.8 % (ref 11.5–15.5)
WBC: 14 10*3/uL — AB (ref 4.0–10.5)

## 2014-08-19 LAB — GLUCOSE, CAPILLARY
Glucose-Capillary: 190 mg/dL — ABNORMAL HIGH (ref 65–99)
Glucose-Capillary: 221 mg/dL — ABNORMAL HIGH (ref 65–99)
Glucose-Capillary: 154 mg/dL — ABNORMAL HIGH (ref 65–99)
Glucose-Capillary: 224 mg/dL — ABNORMAL HIGH (ref 65–99)

## 2014-08-19 MED ORDER — CEFTRIAXONE SODIUM 1 G IJ SOLR
1.0000 g | INTRAMUSCULAR | Status: DC
  Administered 2014-08-19 – 2014-08-22 (×4): 1 g via INTRAVENOUS
  Filled 2014-08-19 (×7): qty 10

## 2014-08-19 MED ORDER — PANTOPRAZOLE SODIUM 40 MG PO TBEC
40.0000 mg | DELAYED_RELEASE_TABLET | Freq: Every day | ORAL | Status: DC
  Administered 2014-08-20 – 2014-08-24 (×5): 40 mg via ORAL
  Filled 2014-08-19 (×5): qty 1

## 2014-08-19 MED ORDER — MAGNESIUM SULFATE 50 % IJ SOLN
3.0000 g | Freq: Once | INTRAVENOUS | Status: AC
  Administered 2014-08-19: 3 g via INTRAVENOUS
  Filled 2014-08-19 (×2): qty 6

## 2014-08-19 MED ORDER — GLIMEPIRIDE 1 MG PO TABS
2.0000 mg | ORAL_TABLET | Freq: Two times a day (BID) | ORAL | Status: DC
  Administered 2014-08-19 – 2014-08-24 (×9): 2 mg via ORAL
  Filled 2014-08-19 (×10): qty 2

## 2014-08-19 MED ORDER — CYANOCOBALAMIN 1000 MCG/ML IJ SOLN
1000.0000 ug | Freq: Once | INTRAMUSCULAR | Status: AC
  Administered 2014-08-19: 1000 ug via INTRAMUSCULAR
  Filled 2014-08-19: qty 1

## 2014-08-19 NOTE — Progress Notes (Signed)
Patient is Disoriented x4. Was oriented on previous assessment. Notified M. Lynch and will continue to monitor closely.

## 2014-08-19 NOTE — Progress Notes (Signed)
  Echocardiogram 2D Echocardiogram has been performed.  Bobbye Charleston 08/19/2014, 11:14 AM

## 2014-08-19 NOTE — Clinical Documentation Improvement (Signed)
Please identify any clinical conditions associated with the abnormal lab values below and document in your future progress notes and discharge summary.    Possible Clinical Conditions: -Anemia (if present, please specify acuity and type) -Other condition (please specify) -[name] [name] at present   Component      RBC Hemoglobin HCT  Latest Ref Rng      3.87 - 5.11 MIL/uL 12.0 - 15.0 g/dL 36.0 - 46.0 %  08/18/2014     11:15 AM 3.75 (L) 10.4 (L) 33.6 (L)  08/19/2014      3.80 (L) 10.3 (L) 33.9 (L)   Thank you, Mateo Flow, RN [phone] Clinical Documentation Specialist

## 2014-08-19 NOTE — Progress Notes (Signed)
Utilization Review completed. Ahlaya Ende RN BSN CM 

## 2014-08-19 NOTE — Progress Notes (Addendum)
[facility] PROGRESS NOTE  [name] [name] ZSW:[id number] DOB: 29-May-1930 DOA: 08/18/2014 PCP: Cari Caraway, MD  Assessment/Plan: Principal Problem:   Atrial fibrillation with RVR Active Problems:   Benign hypertensive heart disease without heart failure   Aortic stenosis, mild   Diabetic peripheral neuropathy associated with type 2 diabetes mellitus   Essential hypertension   COPD GOLD II    Acute encephalopathy    Atrial fibrillation with RVR Rate controlled on IV Cardizem drip and bisoprolol  . -Previously evaluated for possible A. fib but looked like she had PACs only. Also had a cardiac event Monitor in 2015 which showed dysrhythmia only. -Patient has mild aortic stenosis on prior echo and cardiac catheter in 2015. 2-D echo pending. Magnesium 1.4 upon admission, replete and recheck. TSH within normal limits -Her CHADS 2 vasc score is 5, currently on anticoagulation with xarelto Plan is for TEE cardioversion on Monday   Leukocytosis-urine culture positive for greater than 100,000 colonies, gram-negative rods, will start patient on IV Rocephin   Mild acute on chronic diastolic CHF Patient has trace bilateral pitting edema, diminished lung sounds and mildly elevated BNP.  Continue IV Lasix 40 mg twice daily. Monitor strict I/O and daily weight. Continue aspirin, beta blocker and statin which is continued.  ? Acute encephalopathy No clear etiology. No clinical signs of infection. ( chest x-ray UA negative). Mental status appears at baseline at this time. ABG done showed hypercapnia is might he continued routine. TSH normal, severe vitamin B-12 deficiency [125]  will start vitamin B-12 injections . Will monitor for now.   Benign hypertensive heart disease without heart failure Blood pressure stable. Continue losartan, bisoprolol   Aortic stenosis, mild Follow-up with repeat echo.   Diabetic peripheral neuropathy associated with type 2 diabetes  mellitus Continue Neurontin.   Essential hypertension Continue home medications   COPD GOLD II  No acute symptoms. Will resume home inhalers. Place on when necessary nebs, not on home O2. Follows with Dr. Melvyn Novas as outpatient   Diabetes mellitus Hemoglobin A1c pending, hold metformin, continue sliding scale insulin  Diet:cardiac  DVT prophylaxis: sq lovenox Code Status:      Code Status Orders        Start     Ordered   08/18/14 3220  Do not attempt resuscitation (DNR)   Continuous    Question Answer Comment  In the event of cardiac or respiratory ARREST Do not call a "code blue"   In the event of cardiac or respiratory ARREST Do not perform Intubation, CPR, defibrillation or ACLS   In the event of cardiac or respiratory ARREST Use medication by any route, position, wound care, and other measures to relive pain and suffering. May use oxygen, suction and manual treatment of airway obstruction as needed for comfort.      08/18/14 1718     Family Communication: family updated about patient's clinical progress Disposition Plan:  As above    Brief narrative: 81 y.o. year old female with a history of hypertension, cholestasis 2, peripheral neuropathy, diastolic CHF, diabetes mellitus type 2, mild aortic stenosis and normal cors by cath 12/2013. She has a history of PACs but not atrial fib.   She was brought to the ER by EMS for [name] changes and hypoxia. She was in rapid atrial fibrillation and cardiology was asked to evaluate her.   [name] [name] is oriented to name and place, but not date. She has trouble recalling events. She cannot say how  long she has been in atrial fib. She denies palpitations or chest pain. She normally walks with a cane and her activity level is poor at baseline. On a good day, she cannot walk a block or do steps.  She cannot say how long her her SOB has been getting worse, does not remember PND or orthopnea. According to her sister, [name] [name] has been  getting gradually worse over the last 9 months or so, worse in the last week or so.   Consultants:  Cardiology  Procedures:  None  Antibiotics: Anti-infectives    None         HPI/Subjective: No chest pain or shortness of breath, continues to be in atrial fibrillation, rate controlled  Objective: Filed Vitals:   08/19/14 0555 08/19/14 0752 08/19/14 0755 08/19/14 0820  BP: 109/44 113/97    Pulse: 116 52    Temp:    98.3 F (36.8 C)  TempSrc:      Resp: 21 19    Height:      Weight:      SpO2: 97% 98% 100%     Intake/Output Summary (Last 24 hours) at 08/19/14 1240 Last data filed at 08/19/14 0600  Gross per 24 hour  Intake 4015.71 ml  Output    850 ml  Net 3165.71 ml    Exam:  General: No acute respiratory distress Lungs: Clear to auscultation bilaterally without wheezes or crackles Cardiovascular: Regular rate and rhythm without murmur gallop or rub normal S1 and S2 Abdomen: Nontender, nondistended, soft, bowel sounds positive, no rebound, no ascites, no appreciable mass Extremities: No significant cyanosis, clubbing, or edema bilateral lower extremities     Data Review   Micro Results Recent Results (from the past 240 hour(s))  Urine culture     Status: None (Preliminary result)   Collection Time: 08/18/14 10:55 AM  Result Value Ref Range Status   Specimen Description URINE, CLEAN CATCH  Final   Special Requests NONE  Final   Culture >=100,000 COLONIES/mL GRAM NEGATIVE RODS  Final   Report Status PENDING  Incomplete  MRSA PCR Screening     Status: Abnormal   Collection Time: 08/18/14  5:52 PM  Result Value Ref Range Status   MRSA by PCR POSITIVE (A) NEGATIVE Final    Comment:        The GeneXpert MRSA Assay (FDA approved for NASAL specimens only), is one component of a comprehensive MRSA colonization surveillance program. It is not intended to diagnose MRSA infection nor to guide or monitor treatment for MRSA infections. RESULT CALLED  TO, READ BACK BY AND VERIFIED WITH: Mathis Bud RN [id number] 08/18/14 A BROWNING     Radiology Reports Dg Chest Port 1 View  08/18/2014   CLINICAL DATA:  Shortness of breath today with exertion.  EXAM: PORTABLE CHEST - 1 VIEW  COMPARISON:  03/31/2014  FINDINGS: Heart is borderline in size. No confluent airspace opacities, effusions or edema. No acute bony abnormality.  IMPRESSION: No active disease.   Electronically Signed   By: Rolm Baptise M.D.   On: 08/18/2014 11:39     CBC  Recent Labs Lab 08/18/14 1115 08/19/14 0225  WBC 12.8* 14.0*  HGB 10.4* 10.3*  HCT 33.6* 33.9*  PLT 234 256  MCV 89.6 89.2  MCH 27.7 27.1  MCHC 31.0 30.4  RDW 14.8 14.8  LYMPHSABS 1.0  --   MONOABS 0.7  --   EOSABS 0.0  --   BASOSABS 0.0  --  Chemistries   Recent Labs Lab 08/18/14 1115 08/18/14 1411 08/19/14 0225  NA 138  --  138  K 3.9  --  3.7  CL 96*  --  96*  CO2 31  --  33*  GLUCOSE 250*  --  228*  BUN 11  --  9  CREATININE 0.89  --  0.99  CALCIUM 9.2  --  8.6*  MG  --  1.4*  --   AST 21  --   --   ALT 11*  --   --   ALKPHOS 83  --   --   BILITOT 1.0  --   --    ------------------------------------------------------------------------------------------------------------------ estimated creatinine clearance is 53.3 mL/min (by C-G formula based on Cr of 0.99). ------------------------------------------------------------------------------------------------------------------ No results for input(s): HGBA1C in the last 72 hours. ------------------------------------------------------------------------------------------------------------------ No results for input(s): CHOL, HDL, LDLCALC, TRIG, CHOLHDL, LDLDIRECT in the last 72 hours. ------------------------------------------------------------------------------------------------------------------  Recent Labs  08/18/14 1411  TSH 0.976    ------------------------------------------------------------------------------------------------------------------  Recent Labs  08/18/14 1411  VITAMINB12 125*    Coagulation profile No results for input(s): INR, PROTIME in the last 168 hours.  No results for input(s): DDIMER in the last 72 hours.  Cardiac Enzymes No results for input(s): CKMB, TROPONINI, MYOGLOBIN in the last 168 hours.  Invalid input(s): CK ------------------------------------------------------------------------------------------------------------------ Invalid input(s): POCBNP   CBG:  Recent Labs Lab 08/18/14 1743 08/18/14 2106 08/19/14 0741 08/19/14 1152  GLUCAP 206* 249* 190* 221*       Studies: Dg Chest Port 1 View  08/18/2014   CLINICAL DATA:  Shortness of breath today with exertion.  EXAM: PORTABLE CHEST - 1 VIEW  COMPARISON:  03/31/2014  FINDINGS: Heart is borderline in size. No confluent airspace opacities, effusions or edema. No acute bony abnormality.  IMPRESSION: No active disease.   Electronically Signed   By: Rolm Baptise M.D.   On: 08/18/2014 11:39      Lab Results  Component Value Date   HGBA1C 7.5* 02/02/2014   HGBA1C 7.0* 01/11/2014   Lab Results  Component Value Date   CREATININE 0.99 08/19/2014       Scheduled Meds: . bisoprolol  10 mg Oral Daily  . budesonide-formoterol  2 puff Inhalation BID  . calcium citrate  200 mg of elemental calcium Oral Daily  . cholecalciferol  1,000 Units Oral Daily  . famotidine  20 mg Oral Daily  . furosemide  40 mg Intravenous BID  . gabapentin  600 mg Oral BID  . glimepiride  2 mg Oral Q breakfast  . insulin aspart  0-15 Units Subcutaneous TID WC  . insulin aspart  0-5 Units Subcutaneous QHS  . losartan  50 mg Oral BID  . pantoprazole  40 mg Oral q morning - 10a  . rivaroxaban  20 mg Oral Q supper  . rosuvastatin  5 mg Oral QHS  . sodium chloride  3 mL Intravenous Q12H   Continuous Infusions: . diltiazem (CARDIZEM) infusion  5 mg/hr (08/19/14 1236)    Principal Problem:   Atrial fibrillation with RVR Active Problems:   Benign hypertensive heart disease without heart failure   Aortic stenosis, mild   Diabetic peripheral neuropathy associated with type 2 diabetes mellitus   Essential hypertension   COPD GOLD II    Acute encephalopathy    Time spent: 45 minutes   [facility] Pager [phone]. If 7PM-7AM, please contact night-coverage at [email or web address], password [email or web address] 08/19/2014, 12:40 PM  LOS: 1 day

## 2014-08-19 NOTE — Progress Notes (Signed)
Subjective:  She is somewhat sleepy but no complaints today.  Objective:  Vital Signs in the last 24 hours: BP 113/97 mmHg  Pulse 52  Temp(Src) 98.3 F (36.8 C) (Oral)  Resp 19  Ht 5\' 7"  (1.702 m)  Wt 100.426 kg (221 lb 6.4 oz)  BMI 34.67 kg/m2  SpO2 100%  Physical Exam: Obese female currently in no acute distress, mildly confused Lungs:  Clear Cardiac:  Irregular rhythm, normal S1 and S2, no S3, 1 to 2/6 systolic murmur Abdomen:  Soft, nontender, no masses Extremities:  No edema present  Intake/Output from previous day: 08/05 0701 - 08/06 0700 In: 4015.7 [P.O.:100; I.V.:3915.7] Out: 1250 [Urine:1250]  Weight Filed Weights   08/18/14 1726 08/19/14 0429  Weight: 101.379 kg (223 lb 8 oz) 100.426 kg (221 lb 6.4 oz)    Lab Results: Basic Metabolic Panel:  Recent Labs  08/18/14 1115 08/19/14 0225  NA 138 138  K 3.9 3.7  CL 96* 96*  CO2 31 33*  GLUCOSE 250* 228*  BUN 11 9  CREATININE 0.89 0.99   CBC:  Recent Labs  08/18/14 1115 08/19/14 0225  WBC 12.8* 14.0*  NEUTROABS 11.0*  --   HGB 10.4* 10.3*  HCT 33.6* 33.9*  MCV 89.6 89.2  PLT 234 256    Telemetry: Atrial fibrillation currently with controlled response  Assessment/Plan:  1.  Paroxysmal atrial fibrillation with rapid response but currently rate controlled 2.  Mild aortic stenosis 3.  Diabetes mellitus with peripheral neuropathy 4.  Hypertensive heart disease 5.  Encephalopathy and some confusion  Recommendations:  Continues in atrial fibrillation.  Plan is for TEE cardioversion on Monday.   Kerry Hough  MD [facility]  08/19/2014, 12:03 PM

## 2014-08-20 LAB — GLUCOSE, CAPILLARY
Glucose-Capillary: 152 mg/dL — ABNORMAL HIGH (ref 65–99)
Glucose-Capillary: 208 mg/dL — ABNORMAL HIGH (ref 65–99)
Glucose-Capillary: 215 mg/dL — ABNORMAL HIGH (ref 65–99)
Glucose-Capillary: 135 mg/dL — ABNORMAL HIGH (ref 65–99)

## 2014-08-20 LAB — URINE CULTURE

## 2014-08-20 LAB — COMPREHENSIVE METABOLIC PANEL
ALT: 10 U/L — AB (ref 14–54)
AST: 18 U/L (ref 15–41)
Albumin: 3.4 g/dL — ABNORMAL LOW (ref 3.5–5.0)
Alkaline Phosphatase: 82 U/L (ref 38–126)
Anion gap: 9 (ref 5–15)
BUN: 22 mg/dL — ABNORMAL HIGH (ref 6–20)
CHLORIDE: 96 mmol/L — AB (ref 101–111)
CO2: 34 mmol/L — AB (ref 22–32)
Calcium: 8.5 mg/dL — ABNORMAL LOW (ref 8.9–10.3)
Creatinine, Ser: 1.56 mg/dL — ABNORMAL HIGH (ref 0.44–1.00)
GFR calc Af Amer: 35 mL/min — ABNORMAL LOW (ref >60)
GFR, EST NON AFRICAN AMERICAN: 30 mL/min — AB (ref >60)
Glucose, Bld: 172 mg/dL — ABNORMAL HIGH (ref 65–99)
Potassium: 3.5 mmol/L (ref 3.5–5.1)
SODIUM: 139 mmol/L (ref 135–145)
TOTAL PROTEIN: 6.4 g/dL — AB (ref 6.5–8.1)
Total Bilirubin: 0.4 mg/dL (ref 0.3–1.2)

## 2014-08-20 LAB — CBC
HCT: 34.8 % — ABNORMAL LOW (ref 36.0–46.0)
Hemoglobin: 10.2 g/dL — ABNORMAL LOW (ref 12.0–15.0)
MCH: 26.8 pg (ref 26.0–34.0)
MCHC: 29.3 g/dL — AB (ref 30.0–36.0)
MCV: 91.6 fL (ref 78.0–100.0)
Platelets: 251 10*3/uL (ref 150–400)
RBC: 3.8 MIL/uL — ABNORMAL LOW (ref 3.87–5.11)
RDW: 14.8 % (ref 11.5–15.5)
WBC: 12.6 10*3/uL — AB (ref 4.0–10.5)

## 2014-08-20 LAB — MAGNESIUM: MAGNESIUM: 2.2 mg/dL (ref 1.7–2.4)

## 2014-08-20 MED ORDER — SODIUM CHLORIDE 0.9 % IJ SOLN
3.0000 mL | Freq: Two times a day (BID) | INTRAMUSCULAR | Status: DC
  Administered 2014-08-20 – 2014-08-22 (×5): 3 mL via INTRAVENOUS

## 2014-08-20 MED ORDER — RIVAROXABAN 15 MG PO TABS
15.0000 mg | ORAL_TABLET | Freq: Every day | ORAL | Status: DC
  Administered 2014-08-20 – 2014-08-22 (×3): 15 mg via ORAL
  Filled 2014-08-20 (×3): qty 1

## 2014-08-20 MED ORDER — SODIUM CHLORIDE 0.9 % IV SOLN
INTRAVENOUS | Status: DC
  Administered 2014-08-21: 08:00:00 via INTRAVENOUS

## 2014-08-20 MED ORDER — SODIUM CHLORIDE 0.9 % IV BOLUS (SEPSIS)
250.0000 mL | Freq: Once | INTRAVENOUS | Status: AC
  Administered 2014-08-20: 250 mL via INTRAVENOUS

## 2014-08-20 MED ORDER — LOSARTAN POTASSIUM 25 MG PO TABS
25.0000 mg | ORAL_TABLET | Freq: Two times a day (BID) | ORAL | Status: DC
  Administered 2014-08-20 – 2014-08-21 (×2): 25 mg via ORAL
  Filled 2014-08-20 (×2): qty 1

## 2014-08-20 MED ORDER — POTASSIUM CHLORIDE 20 MEQ/15ML (10%) PO SOLN
40.0000 meq | Freq: Once | ORAL | Status: AC
  Administered 2014-08-20: 40 meq via ORAL
  Filled 2014-08-20: qty 30

## 2014-08-20 MED ORDER — SODIUM CHLORIDE 0.9 % IV BOLUS (SEPSIS)
250.0000 mL | Freq: Once | INTRAVENOUS | Status: AC
Start: 2014-08-20 — End: 2014-08-20
  Administered 2014-08-20: 250 mL via INTRAVENOUS

## 2014-08-20 MED ORDER — SODIUM CHLORIDE 0.9 % IJ SOLN
3.0000 mL | INTRAMUSCULAR | Status: DC | PRN
  Administered 2014-08-22: 3 mL via INTRAVENOUS
  Filled 2014-08-20: qty 3

## 2014-08-20 MED ORDER — SODIUM CHLORIDE 0.9 % IV SOLN
250.0000 mL | INTRAVENOUS | Status: DC
  Administered 2014-08-21: 250 mL via INTRAVENOUS

## 2014-08-20 NOTE — Progress Notes (Signed)
Subjective:  More awake today.  She denies shortness of breath or chest pain.  Objective:  Vital Signs in the last 24 hours: BP 106/66 mmHg  Pulse 81  Temp(Src) 98.2 F (36.8 C) (Oral)  Resp 17  Ht 5\' 7"  (1.702 m)  Wt 101.606 kg (224 lb)  BMI 35.08 kg/m2  SpO2 100%  Physical Exam: Obese female currently in no acute distress, mildly confused Lungs:  Clear Cardiac:  Irregular rhythm, normal S1 and S2, no S3, 1 to 2/6 systolic murmur Abdomen:  Soft, nontender, no masses Extremities:  No edema present  Intake/Output from previous day: 08/06 0701 - 08/07 0700 In: 5251.4 [P.O.:580; I.V.:4515.4; IV Piggyback:156] Out: 725 [Urine:725]  Weight Filed Weights   08/18/14 1726 08/19/14 0429 08/20/14 0500  Weight: 101.379 kg (223 lb 8 oz) 100.426 kg (221 lb 6.4 oz) 101.606 kg (224 lb)    Lab Results: Basic Metabolic Panel:  Recent Labs  08/19/14 0225 08/20/14 0333  NA 138 139  K 3.7 3.5  CL 96* 96*  CO2 33* 34*  GLUCOSE 228* 172*  BUN 9 22*  CREATININE 0.99 1.56*   CBC:  Recent Labs  08/18/14 1115 08/19/14 0225 08/20/14 0333  WBC 12.8* 14.0* 12.6*  NEUTROABS 11.0*  --   --   HGB 10.4* 10.3* 10.2*  HCT 33.6* 33.9* 34.8*  MCV 89.6 89.2 91.6  PLT 234 256 251    Telemetry: Atrial fibrillation currently with controlled response  Assessment/Plan:  1.  Paroxysmal atrial fibrillation with rapid response but currently rate controlled 2.  Mild aortic stenosis 3.  Diabetes mellitus with peripheral neuropathy 4.  Hypertensive heart disease 5.  Encephalopathy and some confusion-appears to have improved. 6.  Hypokinemia-replete  Recommendations:  Continues in atrial fibrillation.  Plan is for TEE cardioversion tomorrow.  Discussed TEE with patient including how the procedure is done as well as cardioversion and potential risks.  She is agreeable to proceed.  Kerry Hough  MD [facility]  08/20/2014, 11:18 AM

## 2014-08-20 NOTE — Progress Notes (Addendum)
BP in right arm 83/49 and BP left arm 103/55 HR 70s-90s in A-fib. Notified Cards fellow. Cardizem drip at 68ml/hr.   Received order from Dr. Marlowe Sax to hold Cardizem drip at this time.

## 2014-08-20 NOTE — Progress Notes (Signed)
[facility] PROGRESS NOTE  [name] [name] JGG:[id number] DOB: 11/03/1930 DOA: 08/18/2014 PCP: Cari Caraway, MD  Assessment/Plan: Principal Problem:   Atrial fibrillation with RVR Active Problems:   Benign hypertensive heart disease without heart failure   Aortic stenosis, mild   Diabetic peripheral neuropathy associated with type 2 diabetes mellitus   Essential hypertension   COPD GOLD II    Acute encephalopathy    Atrial fibrillation with RVR Discontinued  IV Cardizem drip due to low BP ,cont bisoprolol  . -Previously evaluated for possible A. fib but looked like she had PACs only. Also had a cardiac event Monitor in 2015 which showed dysrhythmia only. -Patient has mild aortic stenosis on prior echo and cardiac catheter in 2015. 2-D echo EF: 55% -  60% . Mild MR , repeat  Magnesium 2.2  ,  TSH within normal limits -Her CHADS 2 vasc score is 5, currently on anticoagulation with xarelto Plan is for TEE cardioversion on Monday   Acute kidney injury, likely prerenal Creatinine has gone up from 0.99>> 1.56, Cardizem held, discontinued IV Lasix Decrease the dose of losartan to 25 twice a day, patient received 2 50 mL normal saline bolus this morning Recheck renal function in a.m., try to keep systolic blood pressure greater than 120   Leukocytosis-urine culture positive for Klebsiella pneumonia> 100,000 colonies, continue IV Rocephin   Mild acute on chronic diastolic CHF Discontinued IV Lasix 40 mg twice daily given jump in her creatinine. Monitor strict I/O and daily weight. Continue aspirin, beta blocker and statin which is continued.  ? Acute encephalopathy, resolved No clear etiology. No clinical signs of infection. ( chest x-ray UA negative). Mental status appears at baseline at this time. ABG done showed hypercapnia is might he continued routine. TSH normal, severe vitamin B-12 deficiency [125]  will start vitamin B-12 injections . Will monitor for now.     Benign hypertensive heart disease without heart failure Blood pressure stable. Continue losartan, bisoprolol   Aortic stenosis, mild Repeat echo with results as above   Diabetic peripheral neuropathy associated with type 2 diabetes mellitus Continue Neurontin.   Essential hypertension Continue home medications   COPD GOLD II  No acute symptoms. Will resume home inhalers. Place on when necessary nebs, not on home O2. Follows with Dr. Melvyn Novas as outpatient   Diabetes mellitus Recheck hemoglobin A1c, hold metformin, continue sliding scale insulin  Diet:cardiac  DVT prophylaxis: sq lovenox Code Status:      Code Status Orders        Start     Ordered   08/18/14 5465  Do not attempt resuscitation (DNR)   Continuous    Question Answer Comment  In the event of cardiac or respiratory ARREST Do not call a "code blue"   In the event of cardiac or respiratory ARREST Do not perform Intubation, CPR, defibrillation or ACLS   In the event of cardiac or respiratory ARREST Use medication by any route, position, wound care, and other measures to relive pain and suffering. May use oxygen, suction and manual treatment of airway obstruction as needed for comfort.      08/18/14 1718     Family Communication: family updated about patient's clinical progress Disposition Plan:  As above    Brief narrative: 81 y.o. year old female with a history of hypertension, cholestasis 2, peripheral neuropathy, diastolic CHF, diabetes mellitus type 2, mild aortic stenosis and normal cors by cath 12/2013. She has a history of PACs  but not atrial fib.   She was brought to the ER by EMS for [name] changes and hypoxia. She was in rapid atrial fibrillation and cardiology was asked to evaluate her.   [name] [name] is oriented to name and place, but not date. She has trouble recalling events. She cannot say how long she has been in atrial fib. She denies palpitations or chest pain. She normally walks with a cane and  her activity level is poor at baseline. On a good day, she cannot walk a block or do steps.  She cannot say how long her her SOB has been getting worse, does not remember PND or orthopnea. According to her sister, [name] [name] has been getting gradually worse over the last 9 months or so, worse in the last week or so.   Consultants:  Cardiology  Procedures:  None  Antibiotics: Anti-infectives    Start     Dose/Rate Route Frequency Ordered Stop   08/19/14 1330  cefTRIAXone (ROCEPHIN) 1 g in dextrose 5 % 50 mL IVPB     1 g 100 mL/hr over 30 Minutes Intravenous Every 24 hours 08/19/14 1248           HPI/Subjective: No chest pain or shortness of breath, continues to be in atrial fibrillation, rate controlled  Objective: Filed Vitals:   08/20/14 0505 08/20/14 0510 08/20/14 0743 08/20/14 0814  BP: 85/35 103/55  106/66  Pulse: 70 48  81  Temp:  98.2 F (36.8 C)    TempSrc:  Oral    Resp: 25 19  17   Height:      Weight:      SpO2: 100% 100% 100% 100%    Intake/Output Summary (Last 24 hours) at 08/20/14 1026 Last data filed at 08/20/14 0900  Gross per 24 hour  Intake 5251.42 ml  Output    725 ml  Net 4526.42 ml    Exam:  General: No acute respiratory distress Lungs: Clear to auscultation bilaterally without wheezes or crackles Cardiovascular: Regular rate and rhythm without murmur gallop or rub normal S1 and S2 Abdomen: Nontender, nondistended, soft, bowel sounds positive, no rebound, no ascites, no appreciable mass Extremities: No significant cyanosis, clubbing, or edema bilateral lower extremities     Data Review   Micro Results Recent Results (from the past 240 hour(s))  Urine culture     Status: None   Collection Time: 08/18/14 10:55 AM  Result Value Ref Range Status   Specimen Description URINE, CLEAN CATCH  Final   Special Requests NONE  Final   Culture >=100,000 COLONIES/mL KLEBSIELLA PNEUMONIAE  Final   Report Status 08/20/2014 FINAL  Final    Organism ID, Bacteria KLEBSIELLA PNEUMONIAE  Final      Susceptibility   Klebsiella pneumoniae - MIC*    AMPICILLIN 16 RESISTANT Resistant     CEFAZOLIN <=4 SENSITIVE Sensitive     CEFTRIAXONE <=1 SENSITIVE Sensitive     CIPROFLOXACIN <=0.25 SENSITIVE Sensitive     GENTAMICIN <=1 SENSITIVE Sensitive     IMIPENEM <=0.25 SENSITIVE Sensitive     NITROFURANTOIN 32 SENSITIVE Sensitive     TRIMETH/SULFA >=320 RESISTANT Resistant     AMPICILLIN/SULBACTAM 4 SENSITIVE Sensitive     PIP/TAZO <=4 SENSITIVE Sensitive     * >=100,000 COLONIES/mL KLEBSIELLA PNEUMONIAE  MRSA PCR Screening     Status: Abnormal   Collection Time: 08/18/14  5:52 PM  Result Value Ref Range Status   MRSA by PCR POSITIVE (A) NEGATIVE Final    Comment:  The GeneXpert MRSA Assay (FDA approved for NASAL specimens only), is one component of a comprehensive MRSA colonization surveillance program. It is not intended to diagnose MRSA infection nor to guide or monitor treatment for MRSA infections. RESULT CALLED TO, READ BACK BY AND VERIFIED WITH: Mathis Bud RN [id number] 08/18/14 A BROWNING     Radiology Reports Dg Chest Port 1 View  08/18/2014   CLINICAL DATA:  Shortness of breath today with exertion.  EXAM: PORTABLE CHEST - 1 VIEW  COMPARISON:  03/31/2014  FINDINGS: Heart is borderline in size. No confluent airspace opacities, effusions or edema. No acute bony abnormality.  IMPRESSION: No active disease.   Electronically Signed   By: Rolm Baptise M.D.   On: 08/18/2014 11:39     CBC  Recent Labs Lab 08/18/14 1115 08/19/14 0225 08/20/14 0333  WBC 12.8* 14.0* 12.6*  HGB 10.4* 10.3* 10.2*  HCT 33.6* 33.9* 34.8*  PLT 234 256 251  MCV 89.6 89.2 91.6  MCH 27.7 27.1 26.8  MCHC 31.0 30.4 29.3*  RDW 14.8 14.8 14.8  LYMPHSABS 1.0  --   --   MONOABS 0.7  --   --   EOSABS 0.0  --   --   BASOSABS 0.0  --   --     Chemistries   Recent Labs Lab 08/18/14 1115 08/18/14 1411 08/19/14 0225 08/20/14 0333  NA 138  --   138 139  K 3.9  --  3.7 3.5  CL 96*  --  96* 96*  CO2 31  --  33* 34*  GLUCOSE 250*  --  228* 172*  BUN 11  --  9 22*  CREATININE 0.89  --  0.99 1.56*  CALCIUM 9.2  --  8.6* 8.5*  MG  --  1.4*  --  2.2  AST 21  --   --  18  ALT 11*  --   --  10*  ALKPHOS 83  --   --  82  BILITOT 1.0  --   --  0.4   ------------------------------------------------------------------------------------------------------------------ estimated creatinine clearance is 34.1 mL/min (by C-G formula based on Cr of 1.56). ------------------------------------------------------------------------------------------------------------------ No results for input(s): HGBA1C in the last 72 hours. ------------------------------------------------------------------------------------------------------------------ No results for input(s): CHOL, HDL, LDLCALC, TRIG, CHOLHDL, LDLDIRECT in the last 72 hours. ------------------------------------------------------------------------------------------------------------------  Recent Labs  08/18/14 1411  TSH 0.976   ------------------------------------------------------------------------------------------------------------------  Recent Labs  08/18/14 1411  VITAMINB12 125*    Coagulation profile No results for input(s): INR, PROTIME in the last 168 hours.  No results for input(s): DDIMER in the last 72 hours.  Cardiac Enzymes No results for input(s): CKMB, TROPONINI, MYOGLOBIN in the last 168 hours.  Invalid input(s): CK ------------------------------------------------------------------------------------------------------------------ Invalid input(s): POCBNP   CBG:  Recent Labs Lab 08/19/14 0741 08/19/14 1152 08/19/14 1616 08/19/14 1958 08/20/14 0809  GLUCAP 190* 221* 154* 224* 152*       Studies: Dg Chest Port 1 View  08/18/2014   CLINICAL DATA:  Shortness of breath today with exertion.  EXAM: PORTABLE CHEST - 1 VIEW  COMPARISON:  03/31/2014  FINDINGS:  Heart is borderline in size. No confluent airspace opacities, effusions or edema. No acute bony abnormality.  IMPRESSION: No active disease.   Electronically Signed   By: Rolm Baptise M.D.   On: 08/18/2014 11:39      Lab Results  Component Value Date   HGBA1C 7.5* 02/02/2014   HGBA1C 7.0* 01/11/2014   Lab Results  Component Value Date   CREATININE 1.56* 08/20/2014  Scheduled Meds: . bisoprolol  10 mg Oral Daily  . budesonide-formoterol  2 puff Inhalation BID  . calcium citrate  200 mg of elemental calcium Oral Daily  . cefTRIAXone (ROCEPHIN)  IV  1 g Intravenous Q24H  . cholecalciferol  1,000 Units Oral Daily  . famotidine  20 mg Oral Daily  . gabapentin  600 mg Oral BID  . glimepiride  2 mg Oral BID  . insulin aspart  0-15 Units Subcutaneous TID WC  . insulin aspart  0-5 Units Subcutaneous QHS  . losartan  25 mg Oral BID  . pantoprazole  40 mg Oral QAC breakfast  . rivaroxaban  20 mg Oral Q supper  . rosuvastatin  5 mg Oral QHS  . sodium chloride  3 mL Intravenous Q12H   Continuous Infusions: . diltiazem (CARDIZEM) infusion Stopped (08/20/14 0505)    Principal Problem:   Atrial fibrillation with RVR Active Problems:   Benign hypertensive heart disease without heart failure   Aortic stenosis, mild   Diabetic peripheral neuropathy associated with type 2 diabetes mellitus   Essential hypertension   COPD GOLD II    Acute encephalopathy    Time spent: 45 minutes   [facility] Pager [phone]. If 7PM-7AM, please contact night-coverage at [email or web address], password [facility] 08/20/2014, 10:26 AM  LOS: 2 days

## 2014-08-21 ENCOUNTER — Ambulatory Visit (HOSPITAL_COMMUNITY): Payer: Medicare HMO

## 2014-08-21 ENCOUNTER — Inpatient Hospital Stay (HOSPITAL_COMMUNITY): Payer: Medicare HMO

## 2014-08-21 LAB — GLUCOSE, CAPILLARY
GLUCOSE-CAPILLARY: 204 mg/dL — AB (ref 65–99)
Glucose-Capillary: 163 mg/dL — ABNORMAL HIGH (ref 65–99)
Glucose-Capillary: 209 mg/dL — ABNORMAL HIGH (ref 65–99)
Glucose-Capillary: 248 mg/dL — ABNORMAL HIGH (ref 65–99)

## 2014-08-21 LAB — COMPREHENSIVE METABOLIC PANEL
ALT: 11 U/L — ABNORMAL LOW (ref 14–54)
AST: 19 U/L (ref 15–41)
Albumin: 3.2 g/dL — ABNORMAL LOW (ref 3.5–5.0)
Alkaline Phosphatase: 72 U/L (ref 38–126)
Anion gap: 8 (ref 5–15)
BUN: 31 mg/dL — ABNORMAL HIGH (ref 6–20)
CHLORIDE: 99 mmol/L — AB (ref 101–111)
CO2: 31 mmol/L (ref 22–32)
Calcium: 8.5 mg/dL — ABNORMAL LOW (ref 8.9–10.3)
Creatinine, Ser: 1.39 mg/dL — ABNORMAL HIGH (ref 0.44–1.00)
GFR, EST AFRICAN AMERICAN: 40 mL/min — AB (ref >60)
GFR, EST NON AFRICAN AMERICAN: 34 mL/min — AB (ref >60)
Glucose, Bld: 201 mg/dL — ABNORMAL HIGH (ref 65–99)
POTASSIUM: 4.8 mmol/L (ref 3.5–5.1)
Sodium: 138 mmol/L (ref 135–145)
Total Bilirubin: 0.4 mg/dL (ref 0.3–1.2)
Total Protein: 6.1 g/dL — ABNORMAL LOW (ref 6.5–8.1)

## 2014-08-21 LAB — CBC
HCT: 34.1 % — ABNORMAL LOW (ref 36.0–46.0)
Hemoglobin: 10 g/dL — ABNORMAL LOW (ref 12.0–15.0)
MCH: 27 pg (ref 26.0–34.0)
MCHC: 29.3 g/dL — ABNORMAL LOW (ref 30.0–36.0)
MCV: 91.9 fL (ref 78.0–100.0)
Platelets: 217 10*3/uL (ref 150–400)
RBC: 3.71 MIL/uL — ABNORMAL LOW (ref 3.87–5.11)
RDW: 14.7 % (ref 11.5–15.5)
WBC: 12.9 10*3/uL — ABNORMAL HIGH (ref 4.0–10.5)

## 2014-08-21 LAB — BASIC METABOLIC PANEL
Anion gap: 8 (ref 5–15)
BUN: 28 mg/dL — ABNORMAL HIGH (ref 6–20)
CALCIUM: 9 mg/dL (ref 8.9–10.3)
CO2: 33 mmol/L — AB (ref 22–32)
Chloride: 96 mmol/L — ABNORMAL LOW (ref 101–111)
Creatinine, Ser: 1.21 mg/dL — ABNORMAL HIGH (ref 0.44–1.00)
GFR calc Af Amer: 47 mL/min — ABNORMAL LOW (ref >60)
GFR, EST NON AFRICAN AMERICAN: 41 mL/min — AB (ref >60)
Glucose, Bld: 225 mg/dL — ABNORMAL HIGH (ref 65–99)
POTASSIUM: 5 mmol/L (ref 3.5–5.1)
Sodium: 137 mmol/L (ref 135–145)

## 2014-08-21 LAB — HEMOGLOBIN A1C
Hgb A1c MFr Bld: 8.6 % — ABNORMAL HIGH (ref 4.8–5.6)
Mean Plasma Glucose: 200 mg/dL

## 2014-08-21 MED ORDER — METOPROLOL TARTRATE 1 MG/ML IV SOLN: 5.0000 mg | INTRAVENOUS | Status: DC | PRN

## 2014-08-21 MED ORDER — BISOPROLOL FUMARATE 5 MG PO TABS
10.0000 mg | ORAL_TABLET | Freq: Two times a day (BID) | ORAL | Status: DC
  Administered 2014-08-21 – 2014-08-23 (×5): 10 mg via ORAL
  Filled 2014-08-21 (×5): qty 2

## 2014-08-21 MED ORDER — METOPROLOL TARTRATE 1 MG/ML IV SOLN
5.0000 mg | Freq: Once | INTRAVENOUS | Status: AC
  Administered 2014-08-21: 5 mg via INTRAVENOUS
  Filled 2014-08-21: qty 5

## 2014-08-21 MED ORDER — MUPIROCIN 2 % EX OINT
TOPICAL_OINTMENT | Freq: Two times a day (BID) | CUTANEOUS | Status: DC
  Administered 2014-08-21 (×2): via NASAL
  Administered 2014-08-22: 1 via NASAL
  Administered 2014-08-22 – 2014-08-23 (×2): via NASAL
  Administered 2014-08-23: 1 via NASAL
  Administered 2014-08-24: 09:00:00 via NASAL
  Filled 2014-08-21 (×3): qty 22

## 2014-08-21 MED ORDER — OFF THE BEAT BOOK
Freq: Once | Status: AC
  Administered 2014-08-21: 1
  Filled 2014-08-21: qty 1

## 2014-08-21 MED ORDER — LOSARTAN POTASSIUM 25 MG PO TABS
25.0000 mg | ORAL_TABLET | Freq: Every day | ORAL | Status: DC
  Administered 2014-08-22 – 2014-08-24 (×3): 25 mg via ORAL
  Filled 2014-08-21 (×3): qty 1

## 2014-08-21 MED ORDER — CLONAZEPAM 0.5 MG PO TABS
0.5000 mg | ORAL_TABLET | Freq: Two times a day (BID) | ORAL | Status: DC | PRN
  Administered 2014-08-21: 0.5 mg via ORAL
  Filled 2014-08-21: qty 1

## 2014-08-21 MED ORDER — TRAZODONE HCL 50 MG PO TABS
50.0000 mg | ORAL_TABLET | Freq: Every day | ORAL | Status: DC
  Administered 2014-08-21 – 2014-08-23 (×3): 50 mg via ORAL
  Filled 2014-08-21 (×3): qty 1

## 2014-08-21 NOTE — Progress Notes (Signed)
OT Cancellation Note  Patient Details Name: [name] [name] MRN: [id number] DOB: 02-Aug-1930   Cancelled Treatment:    Reason Eval/Treat Not Completed: Patient at procedure or test/ unavailable  Darlina Rumpf [place], OTR/L [phone]  08/21/2014, 3:32 PM

## 2014-08-21 NOTE — Progress Notes (Signed)
Subjective: No complaints, but confused on when she ate, agitated.   Objective: Vital signs in last 24 hours: Temp:  [97.4 F (36.3 C)-98.2 F (36.8 C)] 98.2 F (36.8 C) (08/08 0400) Pulse Rate:  [76-114] 102 (08/08 0400) Resp:  [18-28] 18 (08/08 0400) BP: (105-129)/(45-80) 129/64 mmHg (08/08 0400) SpO2:  [89 %-100 %] 98 % (08/08 0400) Weight:  [224 lb 6.4 oz (101.787 kg)] 224 lb 6.4 oz (101.787 kg) (08/08 0400) Weight change: 6.4 oz (0.181 kg) Last BM Date: 08/19/14 Intake/Output from previous day: +246 08/07 0701 - 08/08 0700 In: 796 [P.O.:740; I.V.:6; IV Piggyback:50] Out: 550 [Urine:550] Intake/Output this shift:    PE: General:Pleasant affect, NAD, but agitated  Skin:Warm and dry, brisk capillary refill HEENT:normocephalic, sclera clear, mucus membranes moist Neck:supple, no JVD Heart:irreg irreg elevated HR without murmur, gallup, rub or click Lungs:clear without rales, rhonchi, or wheezes GYB:WLSL, non tender, + BS, do not palpate liver spleen or masses Ext:no to 1+ lower ext edema, 2+ pedal pulses, 2+ radial pulses Neuro:alert and oriented at times other times not., MAE, follows commands, + facial symmetry   Lab Results:  Recent Labs  08/20/14 0333 08/21/14 0551  WBC 12.6* 12.9*  HGB 10.2* 10.0*  HCT 34.8* 34.1*  PLT 251 217   BMET  Recent Labs  08/20/14 0333 08/21/14 0551  NA 139 138  K 3.5 4.8  CL 96* 99*  CO2 34* 31  GLUCOSE 172* 201*  BUN 22* 31*  CREATININE 1.56* 1.39*  CALCIUM 8.5* 8.5*   No results for input(s): TROPONINI in the last 72 hours.  Invalid input(s): CK, MB  No results found for: CHOL, HDL, LDLCALC, LDLDIRECT, TRIG, CHOLHDL Lab Results  Component Value Date   HGBA1C 7.5* 02/02/2014     Lab Results  Component Value Date   TSH 0.976 08/18/2014    Hepatic Function Panel  Recent Labs  08/21/14 0551  PROT 6.1*  ALBUMIN 3.2*  AST 19  ALT 11*  ALKPHOS 72  BILITOT 0.4   No results for input(s): CHOL  in the last 72 hours. No results for input(s): PROTIME in the last 72 hours.     Studies/Results: ECHO: Study Conclusions  - Left ventricle: The cavity size was normal. There was moderate concentric hypertrophy. Systolic function was normal. The estimated ejection fraction was in the range of 55% to 60%. Doppler parameters are consistent with elevated mean left atrial filling pressure. - Aortic valve: There was mild to moderate stenosis. There was trivial regurgitation. Valve area (VTI): 1.21 cm^2. Valve area (Vmax): 1.14 cm^2. Valve area (Vmean): 1.25 cm^2. - Mitral valve: Calcified annulus. There was mild regurgitation directed centrally. Valve area by continuity equation (using LVOT flow): 1.11 cm^2. - Left atrium: The atrium was moderately dilated. - Right ventricle: The cavity size was mildly dilated. - Right atrium: The atrium was moderately dilated. - Pulmonary arteries: Systolic pressure was mildly increased. PA peak pressure: 36 mm Hg (S).   Medications: I have reviewed the patient's current medications. Scheduled Meds: . bisoprolol  10 mg Oral Daily  . budesonide-formoterol  2 puff Inhalation BID  . calcium citrate  200 mg of elemental calcium Oral Daily  . cefTRIAXone (ROCEPHIN)  IV  1 g Intravenous Q24H  . cholecalciferol  1,000 Units Oral Daily  . famotidine  20 mg Oral Daily  . gabapentin  600 mg Oral BID  . glimepiride  2 mg Oral BID  . insulin aspart  0-15  Units Subcutaneous TID WC  . insulin aspart  0-5 Units Subcutaneous QHS  . losartan  25 mg Oral BID  . mupirocin ointment   Nasal BID  . off the beat book   Does not apply Once  . pantoprazole  40 mg Oral QAC breakfast  . rivaroxaban  15 mg Oral Q supper  . rosuvastatin  5 mg Oral QHS  . sodium chloride  3 mL Intravenous Q12H  . sodium chloride  3 mL Intravenous Q12H   Continuous Infusions: . sodium chloride 20 mL/hr at 08/21/14 0800  . sodium chloride 250 mL (08/21/14 0800)  .  diltiazem (CARDIZEM) infusion Stopped (08/20/14 0505)   PRN Meds:.acetaminophen **OR** acetaminophen, albuterol, ipratropium-albuterol, metoprolol, nitroGLYCERIN, ondansetron (ZOFRAN) IV, sodium chloride  Assessment/Plan: Principal Problem:   Atrial fibrillation with RVR Active Problems:   Benign hypertensive heart disease without heart failure   Aortic stenosis, mild   Diabetic peripheral neuropathy associated with type 2 diabetes mellitus   Essential hypertension   COPD GOLD II    Acute encephalopathy   1. A fib with RVR- was for TEE/DCCV today but could not schedule is now scheduled for tomorrow but have not been able to arrange anesthesia.  Rate varies but mostly > 105 to 120.  Given IV BB during the night.   She is on zebeta at 10 mg.  Decrease ARB and increas BB   CHA2DS2-VASc Score and unadjusted Ischemic Stroke Rate (% per year) is equal to 9.7 % stroke rate/year she is on Xarelto has had 3 doses, now on 15 mg due to renal function.    2. Mild aortic stenosis 3. Diabetes mellitus with peripheral neuropathy 4. Hypertensive heart disease 5. Encephalopathy and some confusion-did appear to have improved but now confused. She knows month and where she is and then she doesn't.  Angry at times 6. Hypokalemia-repleted now 4.8    LOS: 3 days   Time spent with pt. :15 minutes. [facility] R  Nurse Practitioner Certified Pager [phone] or after 5pm and on weekends call [phone] 08/21/2014, 11:19 AM  The patient is known to me.  She continues to show evidence of intermittent confusion.  She remains in atrial fibrillation with rapid ventricular response.  We will cut back on her ARB in order to allow higher dose of beta blocker for rate control.  She is on Xarelto.  She will have her TEE tomorrow followed by cardioversion if anesthesia coverage can be arranged.

## 2014-08-21 NOTE — Progress Notes (Addendum)
[facility] PROGRESS NOTE  BRILEE PORT GLO:[id number] DOB: 06/16/1930 DOA: 08/18/2014 PCP: Cari Caraway, MD  Assessment/Plan: Principal Problem:   Atrial fibrillation with RVR Active Problems:   Benign hypertensive heart disease without heart failure   Aortic stenosis, mild   Diabetic peripheral neuropathy associated with type 2 diabetes mellitus   Essential hypertension   COPD GOLD II    Acute encephalopathy    Atrial fibrillation with RVR Discontinued  IV Cardizem drip due to low BP 8/7  , start PO cardizem pending  CARDIOVERSION or push iv metoprolol, cont bisoprolol . We will cut back on her ARB in order to allow higher dose of beta blocker for rate control. - Previously evaluated for possible A. fib but looked like she had PACs only. Also had a cardiac event Monitor in 2015 which showed dysrhythmia only. -Patient has mild aortic stenosis on prior echo and cardiac catheter in 2015. 2-D echo EF: 55% -  60% . Mild MR , repeat  Magnesium 2.2  ,  TSH within normal limits -Her CHADS 2 vasc score is 5, currently on anticoagulation with xarelto Plan is for TEE cardioversion today,   Acute kidney injury, likely prerenal Creatinine has gone up from 0.99>> 1.56> 1.39 improving ,  , discontinued IV Lasix Decrease the dose of losartan to 25 twice a day,  Recheck renal function in a.m., try to keep systolic blood pressure greater than 120 Check renal USG given UTI recently and positive urine culture    Leukocytosis-urine culture positive for Klebsiella pneumonia> 100,000 colonies, continue IV Rocephin   Mild acute on chronic diastolic CHF Discontinued IV Lasix 40 mg twice daily given jump in her creatinine. Monitor strict I/O and daily weight. Continue aspirin, beta blocker and statin which is continued.   ? Acute encephalopathy, resolved No clear etiology.  . Mental status appears at baseline at this time. ABG done showed hypercapnia is might he continued routine.  TSH normal, severe vitamin B-12 deficiency [125]  will start vitamin B-12 injections . Will monitor for now.    Benign hypertensive heart disease without heart failure Blood pressure stable. Continue losartan (dose reduced), bisoprolol   Aortic stenosis, mild Repeat echo with results as above   Diabetic peripheral neuropathy associated with type 2 diabetes mellitus Continue Neurontin.   Essential hypertension Continue home medications   COPD GOLD II  No acute symptoms. Will resume home inhalers. Place on when necessary nebs, not on home O2. Follows with Dr. Melvyn Novas as outpatient   Diabetes mellitus Recheck hemoglobin A1c, hold metformin, continue sliding scale insulin  Diet:cardiac  DVT prophylaxis: sq lovenox Code Status:      Code Status Orders        Start     Ordered   08/18/14 1884  Do not attempt resuscitation (DNR)   Continuous    Question Answer Comment  In the event of cardiac or respiratory ARREST Do not call a "code blue"   In the event of cardiac or respiratory ARREST Do not perform Intubation, CPR, defibrillation or ACLS   In the event of cardiac or respiratory ARREST Use medication by any route, position, wound care, and other measures to relive pain and suffering. May use oxygen, suction and manual treatment of airway obstruction as needed for comfort.      08/18/14 1718     Family Communication: family updated about patient's clinical progress Disposition Plan:  As above    Brief narrative: 81 y.o. year old  female with a history of hypertension, cholestasis 2, peripheral neuropathy, diastolic CHF, diabetes mellitus type 2, mild aortic stenosis and normal cors by cath 12/2013. She has a history of PACs but not atrial fib.   She was brought to the ER by EMS for [name] changes and hypoxia. She was in rapid atrial fibrillation and cardiology was asked to evaluate her.   [name] [name] is oriented to name and place, but not date. She has trouble recalling  events. She cannot say how long she has been in atrial fib. She denies palpitations or chest pain. She normally walks with a cane and her activity level is poor at baseline. On a good day, she cannot walk a block or do steps.  She cannot say how long her her SOB has been getting worse, does not remember PND or orthopnea. According to her sister, [name] Klaus has been getting gradually worse over the last 9 months or so, worse in the last week or so.   Consultants:  Cardiology  Procedures:  None  Antibiotics: Anti-infectives    Start     Dose/Rate Route Frequency Ordered Stop   08/19/14 1330  cefTRIAXone (ROCEPHIN) 1 g in dextrose 5 % 50 mL IVPB     1 g 100 mL/hr over 30 Minutes Intravenous Every 24 hours 08/19/14 1248           HPI/Subjective: No chest pain or shortness of breath, continues to be in atrial fibrillation,with RVR overnight   Objective: Filed Vitals:   08/20/14 2045 08/20/14 2122 08/21/14 0000 08/21/14 0400  BP:  111/45 105/72 129/64  Pulse:  101 103 102  Temp:  98.2 F (36.8 C) 97.4 F (36.3 C) 98.2 F (36.8 C)  TempSrc:  Oral Oral Oral  Resp:  21  18  Height:      Weight:    101.787 kg (224 lb 6.4 oz)  SpO2: 99% 96% 100% 98%    Intake/Output Summary (Last 24 hours) at 08/21/14 0932 Last data filed at 08/21/14 0600  Gross per 24 hour  Intake    556 ml  Output    150 ml  Net    406 ml    Exam:  General: No acute respiratory distress Lungs: Clear to auscultation bilaterally without wheezes or crackles Cardiovascular: Regular rate and rhythm without murmur gallop or rub normal S1 and S2 Abdomen: Nontender, nondistended, soft, bowel sounds positive, no rebound, no ascites, no appreciable mass Extremities: No significant cyanosis, clubbing, or edema bilateral lower extremities     Data Review   Micro Results Recent Results (from the past 240 hour(s))  Urine culture     Status: None   Collection Time: 08/18/14 10:55 AM  Result Value Ref  Range Status   Specimen Description URINE, CLEAN CATCH  Final   Special Requests NONE  Final   Culture >=100,000 COLONIES/mL KLEBSIELLA PNEUMONIAE  Final   Report Status 08/20/2014 FINAL  Final   Organism ID, Bacteria KLEBSIELLA PNEUMONIAE  Final      Susceptibility   Klebsiella pneumoniae - MIC*    AMPICILLIN 16 RESISTANT Resistant     CEFAZOLIN <=4 SENSITIVE Sensitive     CEFTRIAXONE <=1 SENSITIVE Sensitive     CIPROFLOXACIN <=0.25 SENSITIVE Sensitive     GENTAMICIN <=1 SENSITIVE Sensitive     IMIPENEM <=0.25 SENSITIVE Sensitive     NITROFURANTOIN 32 SENSITIVE Sensitive     TRIMETH/SULFA >=320 RESISTANT Resistant     AMPICILLIN/SULBACTAM 4 SENSITIVE Sensitive     PIP/TAZO <=  4 SENSITIVE Sensitive     * >=100,000 COLONIES/mL KLEBSIELLA PNEUMONIAE  MRSA PCR Screening     Status: Abnormal   Collection Time: 08/18/14  5:52 PM  Result Value Ref Range Status   MRSA by PCR POSITIVE (A) NEGATIVE Final    Comment:        The GeneXpert MRSA Assay (FDA approved for NASAL specimens only), is one component of a comprehensive MRSA colonization surveillance program. It is not intended to diagnose MRSA infection nor to guide or monitor treatment for MRSA infections. RESULT CALLED TO, READ BACK BY AND VERIFIED WITH: Mathis Bud RN [id number] 08/18/14 A BROWNING     Radiology Reports Dg Chest Port 1 View  08/18/2014   CLINICAL DATA:  Shortness of breath today with exertion.  EXAM: PORTABLE CHEST - 1 VIEW  COMPARISON:  03/31/2014  FINDINGS: Heart is borderline in size. No confluent airspace opacities, effusions or edema. No acute bony abnormality.  IMPRESSION: No active disease.   Electronically Signed   By: Rolm Baptise M.D.   On: 08/18/2014 11:39     CBC  Recent Labs Lab 08/18/14 1115 08/19/14 0225 08/20/14 0333 08/21/14 0551  WBC 12.8* 14.0* 12.6* 12.9*  HGB 10.4* 10.3* 10.2* 10.0*  HCT 33.6* 33.9* 34.8* 34.1*  PLT 234 256 251 217  MCV 89.6 89.2 91.6 91.9  MCH 27.7 27.1 26.8 27.0   MCHC 31.0 30.4 29.3* 29.3*  RDW 14.8 14.8 14.8 14.7  LYMPHSABS 1.0  --   --   --   MONOABS 0.7  --   --   --   EOSABS 0.0  --   --   --   BASOSABS 0.0  --   --   --     Chemistries   Recent Labs Lab 08/18/14 1115 08/18/14 1411 08/19/14 0225 08/20/14 0333 08/21/14 0551  NA 138  --  138 139 138  K 3.9  --  3.7 3.5 4.8  CL 96*  --  96* 96* 99*  CO2 31  --  33* 34* 31  GLUCOSE 250*  --  228* 172* 201*  BUN 11  --  9 22* 31*  CREATININE 0.89  --  0.99 1.56* 1.39*  CALCIUM 9.2  --  8.6* 8.5* 8.5*  MG  --  1.4*  --  2.2  --   AST 21  --   --  18 19  ALT 11*  --   --  10* 11*  ALKPHOS 83  --   --  82 72  BILITOT 1.0  --   --  0.4 0.4   ------------------------------------------------------------------------------------------------------------------ estimated creatinine clearance is 38.3 mL/min (by C-G formula based on Cr of 1.39). ------------------------------------------------------------------------------------------------------------------ No results for input(s): HGBA1C in the last 72 hours. ------------------------------------------------------------------------------------------------------------------ No results for input(s): CHOL, HDL, LDLCALC, TRIG, CHOLHDL, LDLDIRECT in the last 72 hours. ------------------------------------------------------------------------------------------------------------------  Recent Labs  08/18/14 1411  TSH 0.976   ------------------------------------------------------------------------------------------------------------------  Recent Labs  08/18/14 1411  VITAMINB12 125*    Coagulation profile No results for input(s): INR, PROTIME in the last 168 hours.  No results for input(s): DDIMER in the last 72 hours.  Cardiac Enzymes No results for input(s): CKMB, TROPONINI, MYOGLOBIN in the last 168 hours.  Invalid input(s):  CK ------------------------------------------------------------------------------------------------------------------ Invalid input(s): POCBNP   CBG:  Recent Labs Lab 08/20/14 0809 08/20/14 1150 08/20/14 1642 08/20/14 2205 08/21/14 0756  GLUCAP 152* 208* 215* 135* 163*       Studies: No results found.    Lab  Results  Component Value Date   HGBA1C 7.5* 02/02/2014   HGBA1C 7.0* 01/11/2014   Lab Results  Component Value Date   CREATININE 1.39* 08/21/2014       Scheduled Meds: . bisoprolol  10 mg Oral Daily  . budesonide-formoterol  2 puff Inhalation BID  . calcium citrate  200 mg of elemental calcium Oral Daily  . cefTRIAXone (ROCEPHIN)  IV  1 g Intravenous Q24H  . cholecalciferol  1,000 Units Oral Daily  . famotidine  20 mg Oral Daily  . gabapentin  600 mg Oral BID  . glimepiride  2 mg Oral BID  . insulin aspart  0-15 Units Subcutaneous TID WC  . insulin aspart  0-5 Units Subcutaneous QHS  . losartan  25 mg Oral BID  . pantoprazole  40 mg Oral QAC breakfast  . rivaroxaban  15 mg Oral Q supper  . rosuvastatin  5 mg Oral QHS  . sodium chloride  3 mL Intravenous Q12H  . sodium chloride  3 mL Intravenous Q12H   Continuous Infusions: . sodium chloride    . sodium chloride    . diltiazem (CARDIZEM) infusion Stopped (08/20/14 0505)    Principal Problem:   Atrial fibrillation with RVR Active Problems:   Benign hypertensive heart disease without heart failure   Aortic stenosis, mild   Diabetic peripheral neuropathy associated with type 2 diabetes mellitus   Essential hypertension   COPD GOLD II    Acute encephalopathy    Time spent: 45 minutes   [facility] Pager [phone]. If 7PM-7AM, please contact night-coverage at [email or web address], password [facility] 08/21/2014, 9:32 AM  LOS: 3 days

## 2014-08-21 NOTE — Progress Notes (Signed)
The clients heart rate is in the 115's and frequently going into the mid 130's. I notified the night cardiologist, Dr. Eula Fried and he ordered 5mg  metoprolol Intravenous one time dose.

## 2014-08-21 NOTE — Progress Notes (Signed)
ANTICOAGULATION CONSULT NOTE - Follow Up Consult  Pharmacy Consult for rivaroxaban Indication: atrial fibrillation  Allergies  Allergen Reactions  . Codeine Nausea And Vomiting  . Demerol Other (See Comments)    Drunk feeling"  . Doxycycline Nausea And Vomiting  . Statins Other (See Comments)    "hurt"   . Zetia [Ezetimibe] Other (See Comments)    hurt    Patient Measurements: Height: 5\' 7"  (170.2 cm) Weight: 224 lb 6.4 oz (101.787 kg) IBW/kg (Calculated) : 61.6 Heparin Dosing Weight:   Vital Signs: Temp: 98.2 F (36.8 C) (08/08 0400) Temp Source: Oral (08/08 0400) BP: 129/64 mmHg (08/08 0400) Pulse Rate: 102 (08/08 0400)  Labs:  Recent Labs  08/19/14 0225 08/20/14 0333 08/21/14 0551 08/21/14 1239  HGB 10.3* 10.2* 10.0*  --   HCT 33.9* 34.8* 34.1*  --   PLT 256 251 217  --   CREATININE 0.99 1.56* 1.39* 1.21*    Estimated Creatinine Clearance: 44 mL/min (by C-G formula based on Cr of 1.21).   Medical History: Past Medical History  Diagnosis Date  . Mild aortic stenosis     a. 10/2010 Echo: Ef 60-65%, no rwma, mild LVH no AS.  Marland Kitchen Chronic back pain   . Peripheral neuropathy   . GERD (gastroesophageal reflux disease)   . Hypertension   . History of pneumonia   . History of bronchitis   . Basal cell carcinoma     a. 2013.  . Non-cardiac chest pain     LHC w/ nl coronaries and nl EF 60% -12/2013  . PAC (premature atrial contraction)   . Heart murmur   . Type II diabetes mellitus     TYPE 2    Medications:  See EMR  Assessment: 81 yo female admitted with hypoxia and mental status changes. Found to be in AFib, new diagnosis for the pt. She was not on any anticoagulants pta. CHADS2VASC =6. CBC stable and wnl, SCr 0.89, eCrCl 50-55 ml/min. She was started on Xarelto 20 mg daily but the dose was decreased to 15 mg daily due to an acute rise in SCr.   8/8: SCr down to 1.21, CrCl ~ 57 mL/min (based on actual body weight). Cardioversion planned tomorrow    Goal of Therapy:  Monitor platelets by anticoagulation protocol: Yes   Plan:  -Rivaroxaban 15 mg po daily with evening meal. Continue to monitor renal fx. If remains stable, may be able to increase Xarelto back to 20 mg daily  -CBC q72h -Monitor s/sx bleeding -Monitor renal fx, close to requiring dose adjustment  Albertina Parr, PharmD., BCPS Clinical Pharmacist Pager [phone]

## 2014-08-21 NOTE — Discharge Instructions (Addendum)
Information on my medicine - XARELTO (Rivaroxaban)  This medication education was reviewed with me or my healthcare representative as part of my discharge preparation.  The pharmacist that spoke with me during my hospital stay was:  Linda Hedges, [facility]  Why was Xarelto prescribed for you? Xarelto was prescribed for you to reduce the risk of a blood clot forming that can cause a stroke if you have a medical condition called atrial fibrillation (a type of irregular heartbeat).  What do you need to know about xarelto ? Take your Xarelto ONCE DAILY at the same time every day with your evening meal. If you have difficulty swallowing the tablet whole, you may crush it and mix in applesauce just prior to taking your dose.  Take Xarelto exactly as prescribed by your doctor and DO NOT stop taking Xarelto without talking to the doctor who prescribed the medication.  Stopping without other stroke prevention medication to take the place of Xarelto may increase your risk of developing a clot that causes a stroke.  Refill your prescription before you run out.  After discharge, you should have regular check-up appointments with your healthcare provider that is prescribing your Xarelto.  In the future your dose may need to be changed if your kidney function or weight changes by a significant amount.  What do you do if you miss a dose? If you are taking Xarelto ONCE DAILY and you miss a dose, take it as soon as you remember on the same day then continue your regularly scheduled once daily regimen the next day. Do not take two doses of Xarelto at the same time or on the same day.   Important Safety Information A possible side effect of Xarelto is bleeding. You should call your healthcare provider right away if you experience any of the following: ? Bleeding from an injury or your nose that does not stop. ? Unusual colored urine (red or dark brown) or unusual colored stools (red or black). ? Unusual  bruising for unknown reasons. ? A serious fall or if you hit your head (even if there is no bleeding).  Some medicines may interact with Xarelto and might increase your risk of bleeding while on Xarelto. To help avoid this, consult your healthcare provider or pharmacist prior to using any new prescription or non-prescription medications, including herbals, vitamins, non-steroidal anti-inflammatory drugs (NSAIDs) and supplements.  This website has more information on Xarelto: [email or web address]   Atrial Fibrillation Atrial fibrillation is a type of irregular heart rhythm (arrhythmia). During atrial fibrillation, the upper chambers of the heart (atria) quiver continuously in a chaotic pattern. This causes an irregular and often rapid heart rate.  Atrial fibrillation is the result of the heart becoming overloaded with disorganized signals that tell it to beat. These signals are normally released one at a time by a part of the right atrium called the sinoatrial node. They then travel from the atria to the lower chambers of the heart (ventricles), causing the atria and ventricles to contract and pump blood as they pass. In atrial fibrillation, parts of the atria outside of the sinoatrial node also release these signals. This results in two problems. First, the atria receive so many signals that they do not have time to fully contract. Second, the ventricles, which can only receive one signal at a time, beat irregularly and out of rhythm with the atria.  There are three types of atrial fibrillation:   Paroxysmal. Paroxysmal atrial fibrillation starts suddenly and stops on  its own within a week.  Persistent. Persistent atrial fibrillation lasts for more than a week. It may stop on its own or with treatment.  Permanent. Permanent atrial fibrillation does not go away. Episodes of atrial fibrillation may lead to permanent atrial fibrillation. Atrial fibrillation can prevent your heart from pumping blood  normally. It increases your risk of stroke and can lead to heart failure.  CAUSES   Heart conditions, including a heart attack, heart failure, coronary artery disease, and heart valve conditions.   Inflammation of the sac that surrounds the heart (pericarditis).  Blockage of an artery in the lungs (pulmonary embolism).  Pneumonia or other infections.  Chronic lung disease.  Thyroid problems, especially if the thyroid is overactive (hyperthyroidism).  Caffeine, excessive alcohol use, and use of some illegal drugs.   Use of some medicines, including certain decongestants and diet pills.  Heart surgery.   Birth defects.  Sometimes, no cause can be found. When this happens, the atrial fibrillation is called lone atrial fibrillation. The risk of complications from atrial fibrillation increases if you have lone atrial fibrillation and you are age 23 years or older. RISK FACTORS  Heart failure.  Coronary artery disease.  Diabetes mellitus.   High blood pressure (hypertension).   Obesity.   Other arrhythmias.   Increased age. SIGNS AND SYMPTOMS   A feeling that your heart is beating rapidly or irregularly.   A feeling of discomfort or pain in your chest.   Shortness of breath.   Sudden light-headedness or weakness.   Getting tired easily when exercising.   Urinating more often than normal (mainly when atrial fibrillation first begins).  In paroxysmal atrial fibrillation, symptoms may start and suddenly stop. DIAGNOSIS  Your health care provider may be able to detect atrial fibrillation when taking your pulse. Your health care provider may have you take a test called an ambulatory electrocardiogram (ECG). An ECG records your heartbeat patterns over a 24-hour period. You may also have other tests, such as:  Transthoracic echocardiogram (TTE). During echocardiography, sound waves are used to evaluate how blood flows through your heart.  Transesophageal  echocardiogram (TEE).  Stress test. There is more than one type of stress test. If a stress test is needed, ask your health care provider about which type is best for you.  Chest X-ray exam.  Blood tests.  Computed tomography (CT). TREATMENT  Treatment may include:  Treating any underlying conditions. For example, if you have an overactive thyroid, treating the condition may correct atrial fibrillation.  Taking medicine. Medicines may be given to control a rapid heart rate or to prevent blood clots, heart failure, or a stroke.  Having a procedure to correct the rhythm of the heart:  Electrical cardioversion. During electrical cardioversion, a controlled, low-energy shock is delivered to the heart through your skin. If you have chest pain, very low blood pressure, or sudden heart failure, this procedure may need to be done as an emergency.  Catheter ablation. During this procedure, heart tissues that send the signals that cause atrial fibrillation are destroyed.  Surgical ablation. During this surgery, thin lines of heart tissue that carry the abnormal signals are destroyed. This procedure can either be an open-heart surgery or a minimally invasive surgery. With the minimally invasive surgery, small cuts are made to access the heart instead of a large opening.  Pulmonary venous isolation. During this surgery, tissue around the veins that carry blood from the lungs (pulmonary veins) is destroyed. This tissue is thought to  carry the abnormal signals. HOME CARE INSTRUCTIONS   Take medicines only as directed by your health care provider. Some medicines can make atrial fibrillation worse or recur.  If blood thinners were prescribed by your health care provider, take them exactly as directed. Too much blood-thinning medicine can cause bleeding. If you take too little, you will not have the needed protection against stroke and other problems.  Perform blood tests at home if directed by your  health care provider. Perform blood tests exactly as directed.  Quit smoking if you smoke.  Do not drink alcohol.  Do not drink caffeinated beverages such as coffee, soda, and some teas. You may drink decaffeinated coffee, soda, or tea.   Maintain a healthy weight.Do not use diet pills unless your health care provider approves. They may make heart problems worse.   Follow diet instructions as directed by your health care provider.  Exercise regularly as directed by your health care provider.  Keep all follow-up visits as directed by your health care provider. This is important. PREVENTION  The following substances can cause atrial fibrillation to recur:   Caffeinated beverages.  Alcohol.  Certain medicines, especially those used for breathing problems.  Certain herbs and herbal medicines, such as those containing ephedra or ginseng.  Illegal drugs, such as cocaine and amphetamines. Sometimes medicines are given to prevent atrial fibrillation from recurring. Proper treatment of any underlying condition is also important in helping prevent recurrence.  SEEK MEDICAL CARE IF:  You notice a change in the rate, rhythm, or strength of your heartbeat.  You suddenly begin urinating more frequently.  You tire more easily when exerting yourself or exercising. SEEK IMMEDIATE MEDICAL CARE IF:   You have chest pain, abdominal pain, sweating, or weakness.  You feel nauseous.  You have shortness of breath.  You suddenly have swollen feet and ankles.  You feel dizzy.  Your face or limbs feel numb or weak.  You have a change in your vision or speech. MAKE SURE YOU:   Understand these instructions.  Will watch your condition.  Will get help right away if you are not doing well or get worse. Document Released: 12/30/2004 Document Revised: 05/16/2013 Document Reviewed: 02/10/2012 [facility] Patient Information 2015 [place], [place]. This information is not intended to replace  advice given to you by your health care provider. Make sure you discuss any questions you have with your health care provider.

## 2014-08-22 LAB — COMPREHENSIVE METABOLIC PANEL
ALBUMIN: 3.5 g/dL (ref 3.5–5.0)
ALT: 12 U/L — ABNORMAL LOW (ref 14–54)
AST: 18 U/L (ref 15–41)
Alkaline Phosphatase: 72 U/L (ref 38–126)
Anion gap: 10 (ref 5–15)
BUN: 32 mg/dL — AB (ref 6–20)
CHLORIDE: 95 mmol/L — AB (ref 101–111)
CO2: 31 mmol/L (ref 22–32)
Calcium: 8.8 mg/dL — ABNORMAL LOW (ref 8.9–10.3)
Creatinine, Ser: 1.3 mg/dL — ABNORMAL HIGH (ref 0.44–1.00)
GFR calc non Af Amer: 37 mL/min — ABNORMAL LOW (ref >60)
GFR, EST AFRICAN AMERICAN: 43 mL/min — AB (ref >60)
Glucose, Bld: 248 mg/dL — ABNORMAL HIGH (ref 65–99)
Potassium: 4.9 mmol/L (ref 3.5–5.1)
Sodium: 136 mmol/L (ref 135–145)
Total Bilirubin: 0.2 mg/dL — ABNORMAL LOW (ref 0.3–1.2)
Total Protein: 6.8 g/dL (ref 6.5–8.1)

## 2014-08-22 LAB — CBC
HCT: 33.2 % — ABNORMAL LOW (ref 36.0–46.0)
Hemoglobin: 9.8 g/dL — ABNORMAL LOW (ref 12.0–15.0)
MCH: 26.8 pg (ref 26.0–34.0)
MCHC: 29.5 g/dL — AB (ref 30.0–36.0)
MCV: 91 fL (ref 78.0–100.0)
PLATELETS: 235 10*3/uL (ref 150–400)
RBC: 3.65 MIL/uL — ABNORMAL LOW (ref 3.87–5.11)
RDW: 14.9 % (ref 11.5–15.5)
WBC: 11.3 10*3/uL — ABNORMAL HIGH (ref 4.0–10.5)

## 2014-08-22 LAB — GLUCOSE, CAPILLARY
GLUCOSE-CAPILLARY: 115 mg/dL — AB (ref 65–99)
Glucose-Capillary: 216 mg/dL — ABNORMAL HIGH (ref 65–99)
Glucose-Capillary: 208 mg/dL — ABNORMAL HIGH (ref 65–99)
Glucose-Capillary: 140 mg/dL — ABNORMAL HIGH (ref 65–99)

## 2014-08-22 MED ORDER — CLONAZEPAM 0.5 MG PO TABS
0.2500 mg | ORAL_TABLET | Freq: Two times a day (BID) | ORAL | Status: DC | PRN
  Administered 2014-08-23: 0.25 mg via ORAL
  Filled 2014-08-22: qty 1

## 2014-08-22 MED ORDER — INSULIN ASPART 100 UNIT/ML ~~LOC~~ SOLN: 0.0000 [IU] | Freq: Every day | SUBCUTANEOUS | Status: DC

## 2014-08-22 MED ORDER — INSULIN ASPART 100 UNIT/ML ~~LOC~~ SOLN
0.0000 [IU] | Freq: Three times a day (TID) | SUBCUTANEOUS | Status: DC
Start: 2014-08-22 — End: 2014-08-24
  Administered 2014-08-22: 3 [IU] via SUBCUTANEOUS
  Administered 2014-08-23 (×3): 4 [IU] via SUBCUTANEOUS
  Administered 2014-08-24: 3 [IU] via SUBCUTANEOUS
  Administered 2014-08-24: 7 [IU] via SUBCUTANEOUS

## 2014-08-22 NOTE — Progress Notes (Signed)
Inpatient Diabetes Program Recommendations  AACE/ADA: New Consensus Statement on Inpatient Glycemic Control (2013)  Target Ranges:  Prepandial:   less than 140 mg/dL      Peak postprandial:   less than 180 mg/dL (1-2 hours)      Critically ill patients:  140 - 180 mg/dL   Results for [name] [name], [name] [name] (MRN [id number]) as of 08/22/2014 11:48  Ref. Range 08/21/2014 07:56 08/21/2014 12:30 08/21/2014 17:28 08/21/2014 22:39 08/22/2014 08:01  Glucose-Capillary Latest Ref Range: 65-99 mg/dL 163 (H) 204 (H) 209 (H) 248 (H) 216 (H)   Diabetes history: DM 2 Outpatient Diabetes medications: Amaryl 2 mg Daily, Metformin 1,000 mg BID Current orders for Inpatient glycemic control: Amaryl 2mg  Daily, Novolog Moderate scale + HS  Inpatient Diabetes Program Recommendations Correction (SSI): Glucose in the 200's. Please consider increasing Correction to Novolog Resistant scale.  Thanks,  Tama Headings RN, MSN, [facility] Inpatient Diabetes Coordinator Team Pager [phone]

## 2014-08-22 NOTE — Clinical Social Work Note (Signed)
Clinical Social Work Assessment  Patient Details  Name: [name] [name] MRN: [id number] Date of Birth: 1930/06/28  Date of referral:  08/21/14               Reason for consult:  Facility Placement                Permission sought to share information with:  Chartered certified accountant granted to share information::  Yes, Verbal Permission Granted (Permission granted by daughter)  Name::        Agency::  SNFs for referral purposes  Relationship::     Contact Information:     Housing/Transportation Living arrangements for the past 2 months:  Single Family Home Source of Information:  Adult Children Patient Interpreter Needed:  None Criminal Activity/Legal Involvement Pertinent to Current Situation/Hospitalization:  No - Comment as needed Significant Relationships:  Adult Children, Spouse Lives with:  Spouse Do you feel safe going back to the place where you live?  Yes Need for family participation in patient care:  Yes (Comment) (Pt confused)  Care giving concerns:  Per nursing, pt will likely need SNF at dc. Pt lives with her husband who has dementia and pt with increased confusion.    Social Worker assessment / plan:  CSW spoke with pt daughter, son, and sister. Pt and pt husband also present but both had limited participation in conversation due to confusion. Pt has been to SNF before at [facility] and family agrees that she will likely need this at dc, but family does not want to decide on dc plan until they speak with MD and pt procedure completed. CSW informed family that with pt insurance it can be difficult to get authorization and would be easier to at least begin process today. Family understanding of this and are agreeable to referral being sent to all [facility] while they continue to decide on plan.   Employment status:  Retired Nurse, adult PT Recommendations:  Not assessed at this time [place] / Referral to  community resources:  [place]  Patient/Family's Response to care:  Pt family in agreement for ST rehab at [facility] if needed  Patient/Family's Understanding of and Emotional Response to Diagnosis, Current Treatment, and Prognosis:  Pt family has a good understanding of pt condition. Pt family concerned about pt but emotional response appropriate.  Emotional Assessment Appearance:  Appears stated age Attitude/Demeanor/Rapport:  Unable to Assess Affect (typically observed):  Unable to Assess Orientation:  Oriented to Self Alcohol / Substance use:  Not Applicable Psych involvement (Current and /or in the community):  No (Comment)  Discharge Needs  Concerns to be addressed:  Discharge Planning Concerns Readmission within the last 30 days:  No Current discharge risk:  Dependent with Mobility, Cognitively Impaired Barriers to Discharge:  Continued Medical Work up   [employer], [place]

## 2014-08-22 NOTE — Clinical Social Work Placement (Signed)
   CLINICAL SOCIAL WORK PLACEMENT  NOTE  Date:  08/22/2014  Patient Details  Name: [name] [name] MRN: [id number] Date of Birth: December 03, 1930  Clinical Social Work is seeking post-discharge placement for this patient at the [place] level of care (*CSW will initial, date and re-position this form in  chart as items are completed):  Yes   Patient/family provided with [place] Work Department's list of facilities offering this level of care within the geographic area requested by the patient (or if unable, by the patient's family).  Yes   Patient/family informed of their freedom to choose among providers that offer the needed level of care, that participate in Medicare, Medicaid or managed care program needed by the patient, have an available bed and are willing to accept the patient.  Yes   Patient/family informed of [facility]'s ownership interest in [facility] and [facility], as well as of the fact that they are under no obligation to receive care at these facilities.  PASRR submitted to EDS on       PASRR number received on       Existing PASRR number confirmed on 08/21/14     FL2 transmitted to all facilities in geographic area requested by pt/family on 08/21/14     FL2 transmitted to all facilities within larger geographic area on       Patient informed that his/her managed care company has contracts with or will negotiate with certain facilities, including the following:            Patient/family informed of bed offers received.  Patient chooses bed at       Physician recommends and patient chooses bed at      Patient to be transferred to   on  .  Patient to be transferred to facility by       Patient family notified on   of transfer.  Name of family member notified:        PHYSICIAN Please sign FL2     Additional Comment:    _______________________________________________ Berton [place], [place]

## 2014-08-22 NOTE — Evaluation (Signed)
Physical Therapy Evaluation Patient Details Name: [name] [name] MRN: [id number] DOB: Aug 10, 1930 Today's Date: 08/22/2014   History of Present Illness  81 y.o. female admitted to [facility] on 08/18/14 for hypoxia and acute mental status changes.  Pt dx with A-fib with RVR, acute encephalopathy, acute on chronic diastolic heart failure, acute kidney injury, (+) UTI.  Pt with significant PMHx of chronic back pain, peripheral neuropathy, HTN, DM, and L TKA.  Clinical Impression  Pt is confused and weak on her feet.  Chart and pt confirm that she lives with a husband with cognitive deficits.  She would be safest to d/c to SNF for rehab before returning home.   PT to follow acutely for deficits listed below.       Follow Up Recommendations SNF    Equipment Recommendations  None recommended by PT    Recommendations for Other Services   NA     Precautions / Restrictions Precautions Precautions: Fall;Other (comment) Precaution Comments: monitor HR during session      Mobility  Bed Mobility Overal bed mobility: Needs Assistance Bed Mobility: Supine to Sit     Supine to sit: Mod assist     General bed mobility comments: mod assist to support trunk to get to sitting EOB. Pt using bed rail and HOB elelvated.  Tendancy towards posterior LOB until she got her feet on the floor.   Transfers Overall transfer level: Needs assistance   Transfers: Sit to/from Stand;Stand Pivot Transfers Sit to Stand: Mod assist;Min assist;From elevated surface Stand pivot transfers: Min assist;From elevated surface       General transfer comment: Min assist to stand from elevated bed and elevated BSC, however, when going to stand from low recliner chair, it took multiple attempts and significant effort from the lower surface as well as motor planning difficulty with max verbal and manual cues for hand placement and sequencing .   Ambulation/Gait Ambulation/Gait assistance: Min assist Ambulation Distance (Feet):  3 Feet (x2) Assistive device: Rolling walker (2 wheeled) Gait Pattern/deviations: Step-to pattern;Shuffle;Trunk flexed     General Gait Details: min assist to support trunk and help steer RW in tight space in room to [facility] and then to recliner chair.  Pt very shaky over feet and therapist would need second person to safely ambulate pt further (chair to follow).          Balance Overall balance assessment: Needs assistance Sitting-balance support: Feet supported;Bilateral upper extremity supported Sitting balance-Leahy Scale: Fair Sitting balance - Comments: poor with feet unsupported due to left posterior LOB.  Postural control: Posterior lean;Left lateral lean Standing balance support: Bilateral upper extremity supported Standing balance-Leahy Scale: Poor                               Pertinent Vitals/Pain Pain Assessment: No/denies pain    08/22/14 1101  Vital Signs  Pulse Rate (!) 114  Oxygen Therapy  SpO2 97 %  O2 Device Room Air    Home Living Family/patient expects to be discharged to:: Private residence Living Arrangements: Spouse/significant other Available Help at Discharge: Family (husband has dementia) Type of Home: House Home Access: Stairs to enter Entrance Stairs-Rails: Right Entrance Stairs-Number of Steps: 2 Home Layout: Two level;Able to live on main level with bedroom/bathroom Home Equipment: Kasandra Knudsen - single point;Walker - 2 wheels;Shower seat      Prior Function Level of Independence: Independent with assistive device(s)  Comments: uses cane for ambulation.        Extremity/Trunk Assessment   Upper Extremity Assessment: Defer to OT evaluation           Lower Extremity Assessment: Generalized weakness      Cervical / Trunk Assessment: Normal  Communication   Communication: No difficulties  Cognition Arousal/Alertness: Awake/alert Behavior During Therapy: Restless Overall Cognitive Status: Impaired/Different from  baseline Area of Impairment: Orientation;Attention;Memory;Safety/judgement;Awareness;Problem solving;Following commands Orientation Level: Disoriented to;Place;Time;Situation Current Attention Level: Sustained Memory: Decreased short-term memory Following Commands: Follows one step commands consistently Safety/Judgement: Decreased awareness of safety;Decreased awareness of deficits Awareness: Intellectual Problem Solving: Slow processing;Difficulty sequencing;Requires tactile cues;Requires verbal cues General Comments: "I am in [place]".  Pt at times reporting accurate informatin confirmed by chart, yet she would say things like, "I feel like I am in [place]"             Assessment/Plan    PT Assessment Patient needs continued PT services  PT Diagnosis Difficulty walking;Abnormality of gait;Generalized weakness   PT Problem List Decreased strength;Decreased activity tolerance;Decreased balance;Decreased mobility;Decreased cognition;Decreased knowledge of use of DME;Decreased knowledge of precautions;Decreased safety awareness  PT Treatment Interventions DME instruction;Gait training;Stair training;Functional mobility training;Therapeutic activities;Therapeutic exercise;Balance training;Neuromuscular re-education;Cognitive remediation;Patient/family education   PT Goals (Current goals can be found in the Care Plan section) Acute Rehab PT Goals Patient Stated Goal: unable to state PT Goal Formulation: Patient unable to participate in goal setting Time For Goal Achievement: 09/05/14 Potential to Achieve Goals: Good    Frequency Min 2X/week   Barriers to discharge Decreased caregiver support pt lives with husband with dementia       End of Session   Activity Tolerance: Patient limited by fatigue Patient left: in chair;with call bell/phone within reach;with chair alarm set Nurse Communication: Mobility status;Other (comment) (pt had a very lagre BM during our session)          Time: 1049-1130 PT Time Calculation (min) (ACUTE ONLY): 41 min   Charges:   PT Evaluation $Initial PT Evaluation Tier I: 1 Procedure PT Treatments $Therapeutic Activity: 23-37 mins        Carlon Chaloux B. [place], [place], DPT [phone]   08/22/2014, 12:02 PM

## 2014-08-22 NOTE — Progress Notes (Signed)
CSW (Clinical Education officer, museum) received new consult for SNF. CSW already working with pt family on potential placement.  CSW did speak with pt son and daughter again today at bedside. They still feel as though they cannot make decision on dc plan because of pt continued need for procedure. They are however able to confirm they would like pt to go to [facility] if plan is SNF at dc. CSW notified facility and they confirmed pending insurance authorization they are able to accept pt at dc.  [place], [place]

## 2014-08-22 NOTE — Care Management Note (Addendum)
Case Management Note  Patient Details  Name: [name] [name] MRN: [id number] Date of Birth: September 05, 1930  Subjective/Objective: Pt admitted for Hypoxia and acute mental status change. Awaiting Cardioversion. Pt is from home with husband. Has support of two children at bedside.                    Action/Plan: CM did speak with family in regards to disposition needs on 08-21-14 while giving out IM. Per family they are still undecided, however leaning towards SNF as option. CM will continue to monitor for needs.    Expected Discharge Date:                  Expected Discharge Plan:  Skilled Nursing Facility  In-House Referral:  Clinical Social Work  Discharge planning Services  CM Consult  Post Acute Care Choice:    Choice offered to:     DME Arranged:    DME Agency:     HH Arranged:    [place] Agency:     Status of Service:  In process, will continue to follow  Medicare Important Message Given:  Yes-second notification given Date Medicare IM Given:    Medicare IM give by:    Date Additional Medicare IM Given:    Additional Medicare Important Message give by:     If discussed at [place] of Stay Meetings, dates discussed:    Additional Comments: 1152 08-23-14 [name] Krauss, RN,BSN [phone] CM did have a meeting with daughter and son of pt. Family and pt agreeable to [facility] at d/c once pt is medically stable. CM did provide family with PCS list and [place] [facility] agencies list if this may be option post [employer]. CM did mention the idea of ALF once pt is d/c from [facility] as well to have both husband and wife together. Family appreciative of time spent going over ideas for plan of care. No further needs identified by CM at this time.   [name] Roys, RN 08/22/2014, 4:38 PM

## 2014-08-22 NOTE — Progress Notes (Signed)
[facility] PROGRESS NOTE  [name] [name] FYT:[id number] DOB: April 15, 1930 DOA: 08/18/2014 PCP: Cari Caraway, MD  Assessment/Plan: Principal Problem:   Atrial fibrillation with RVR Active Problems:   Benign hypertensive heart disease without heart failure   Aortic stenosis, mild   Diabetic peripheral neuropathy associated with type 2 diabetes mellitus   Essential hypertension   COPD GOLD II    Acute encephalopathy    Atrial fibrillation with RVR Discontinued  IV Cardizem drip due to low BP 8/7  , continue beta blocker, patient anticipated to have  TEE/DCCV TODAY. We will cut back on her ARB in order to allow higher dose of beta blocker for rate control. - -Patient has mild aortic stenosis on prior echo and cardiac catheter in 2015. 2-D echo EF: 55% -  60% . Mild MR , repeat  Magnesium 2.2  ,  TSH within normal limits -Her CHADS 2 vasc score is 5, currently on anticoagulation with Xarelto 15mg  due to renal dysfuncation Plan is for TEE cardioversion today,   Acute kidney injury, likely prerenal Creatinine has gone up from 0.99>> 1.56> 1.39 improving ,  , discontinued IV Lasix,   Reduced losartan to 25 twice a day,  Recheck renal function in a.m., try to keep systolic blood pressure greater than 120 Renal ultrasound negative for hydronephrosis or obstructive uropathy   Anxiety/Insomnia-patient started on Klonopin and trazodone, appears to be less agitated today,  Leukocytosis-urine culture positive for Klebsiella pneumonia> 100,000 colonies, continue IV Rocephin   Mild acute on chronic diastolic CHF Discontinued IV Lasix 40 mg twice daily given jump in her creatinine. Monitor strict I/O and daily weight. Continue aspirin, beta blocker, ARB and statin     ? Acute encephalopathy, resolved No clear etiology.  . Mental status appears at baseline at this time. ABG done 8/5 showed hypercapnia . TSH normal, severe vitamin B-12 deficiency [125]  will start vitamin B-12  injections weekly . Will monitor for now. Will need neuropsychiatry evaluation in the outpatient setting for dementia    Benign hypertensive heart disease without heart failure Blood pressure stable. Continue losartan (dose reduced), bisoprolol,   Aortic stenosis, mild Repeat echo with results as above   Diabetic peripheral neuropathy associated with type 2 diabetes mellitus Continue Neurontin.   Essential hypertension Continue home medications as above   COPD GOLD II  No acute symptoms. Will resume home inhalers. Place on when necessary nebs, not on home O2. Follows with Dr. Melvyn Novas as outpatient   Diabetes mellitus Hemoglobin A1c 8.6, hold metformin, change to resistant sliding scale  Diet:cardiac  DVT prophylaxis: sq lovenox Code Status:      Code Status Orders        Start     Ordered   08/18/14 1719  Do not attempt resuscitation (DNR)   Continuous    Question Answer Comment  In the event of cardiac or respiratory ARREST Do not call a "code blue"   In the event of cardiac or respiratory ARREST Do not perform Intubation, CPR, defibrillation or ACLS   In the event of cardiac or respiratory ARREST Use medication by any route, position, wound care, and other measures to relive pain and suffering. May use oxygen, suction and manual treatment of airway obstruction as needed for comfort.      08/18/14 1718     Family Communication: family updated about patient's clinical progress Disposition Plan:  PT/OT eval recommends SNF   Brief narrative: 81 y.o. year old female with  a history of hypertension, cholestasis 2, peripheral neuropathy, diastolic CHF, diabetes mellitus type 2, mild aortic stenosis and normal cors by cath 12/2013. She has a history of PACs but not atrial fib.   She was brought to the ER by EMS for [name] changes and hypoxia. She was in rapid atrial fibrillation and cardiology was asked to evaluate her.   [name] [name] is oriented to name and place, but not  date. She has trouble recalling events. She cannot say how long she has been in atrial fib. She denies palpitations or chest pain. She normally walks with a cane and her activity level is poor at baseline. On a good day, she cannot walk a block or do steps.  She cannot say how long her her SOB has been getting worse, does not remember PND or orthopnea. According to her sister, [name] [name] has been getting gradually worse over the last 9 months or so, worse in the last week or so.   Consultants:  Cardiology  Procedures:  None  Antibiotics: Anti-infectives    Start     Dose/Rate Route Frequency Ordered Stop   08/19/14 1330  cefTRIAXone (ROCEPHIN) 1 g in dextrose 5 % 50 mL IVPB     1 g 100 mL/hr over 30 Minutes Intravenous Every 24 hours 08/19/14 1248           HPI/Subjective: No chest pain or shortness of breath, continues to be in atrial fibrillation,with RVR overnight   Objective: Filed Vitals:   08/22/14 0000 08/22/14 0400 08/22/14 0815 08/22/14 1101  BP: 135/75 134/68    Pulse: 119 124  114  Temp: 98 F (36.7 C) 98.3 F (36.8 C)    TempSrc: Oral     Resp: 19 18    Height:      Weight:  100.699 kg (222 lb)    SpO2: 93% 98% 96% 97%    Intake/Output Summary (Last 24 hours) at 08/22/14 1319 Last data filed at 08/22/14 0400  Gross per 24 hour  Intake     53 ml  Output    950 ml  Net   -897 ml    Exam:  General: No acute respiratory distress Lungs: Clear to auscultation bilaterally without wheezes or crackles Cardiovascular: Regular rate and rhythm without murmur gallop or rub normal S1 and S2 Abdomen: Nontender, nondistended, soft, bowel sounds positive, no rebound, no ascites, no appreciable mass Extremities: No significant cyanosis, clubbing, or edema bilateral lower extremities     Data Review   Micro Results Recent Results (from the past 240 hour(s))  Urine culture     Status: None   Collection Time: 08/18/14 10:55 AM  Result Value Ref Range Status    Specimen Description URINE, CLEAN CATCH  Final   Special Requests NONE  Final   Culture >=100,000 COLONIES/mL KLEBSIELLA PNEUMONIAE  Final   Report Status 08/20/2014 FINAL  Final   Organism ID, Bacteria KLEBSIELLA PNEUMONIAE  Final      Susceptibility   Klebsiella pneumoniae - MIC*    AMPICILLIN 16 RESISTANT Resistant     CEFAZOLIN <=4 SENSITIVE Sensitive     CEFTRIAXONE <=1 SENSITIVE Sensitive     CIPROFLOXACIN <=0.25 SENSITIVE Sensitive     GENTAMICIN <=1 SENSITIVE Sensitive     IMIPENEM <=0.25 SENSITIVE Sensitive     NITROFURANTOIN 32 SENSITIVE Sensitive     TRIMETH/SULFA >=320 RESISTANT Resistant     AMPICILLIN/SULBACTAM 4 SENSITIVE Sensitive     PIP/TAZO <=4 SENSITIVE Sensitive     * >=  100,000 COLONIES/mL KLEBSIELLA PNEUMONIAE  MRSA PCR Screening     Status: Abnormal   Collection Time: 08/18/14  5:52 PM  Result Value Ref Range Status   MRSA by PCR POSITIVE (A) NEGATIVE Final    Comment:        The GeneXpert MRSA Assay (FDA approved for NASAL specimens only), is one component of a comprehensive MRSA colonization surveillance program. It is not intended to diagnose MRSA infection nor to guide or monitor treatment for MRSA infections. RESULT CALLED TO, READ BACK BY AND VERIFIED WITH: Mathis Bud RN [id number] 08/18/14 A BROWNING     Radiology Reports Mr Brain Wo Contrast  08/21/2014   CLINICAL DATA:  Mental status changes. Rapid atrial fibrillation. Confusion. Memory loss.  EXAM: MRI HEAD WITHOUT CONTRAST  TECHNIQUE: Multiplanar, multiecho pulse sequences of the brain and surrounding structures were obtained without intravenous contrast.  COMPARISON:  None.  FINDINGS: The diffusion-weighted images demonstrate no evidence for acute or subacute infarction.  The study is moderately degraded by patient motion. Moderate atrophy and periventricular white matter change bilaterally is somewhat advanced for age. No acute infarct, hemorrhage, or mass lesion is present. The ventricles are of  normal size. No significant extra-axial fluid collections are present.  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 White matter changes extend into the brainstem.  Flow is present in the major intracranial arteries. Bilateral lens replacements are present. Circumferential mucosal thickening is present in the posterior sphenoid sinus bilaterally. The remaining paranasal sinuses and the mastoid air cells are clear.  Midline structures are within normal limits.  IMPRESSION: 1. No acute intracranial abnormality. 2. Age advanced atrophy and white matter disease. This likely reflects the sequela of chronic microvascular ischemia.   Electronically Signed   By: San Morelle M.D.   On: 08/21/2014 14:28   US Renal  08/21/2014   CLINICAL DATA:  81 year old female with acute renal injury. Initial encounter.  EXAM: RENAL / URINARY TRACT ULTRASOUND COMPLETE  COMPARISON:  Lumbar spine MRI 01/11/2013.  Chest CTA 02/01/2014.  FINDINGS: Right Kidney:  Length: 10.6 cm. No hydronephrosis. Cortical echogenicity within normal limits. No right renal mass.  Left Kidney:  Length: 10.1 cm. No hydronephrosis. Cortical echotexture within normal limits. No left renal mass.  Bladder:  Decompressed, Foley catheter balloon visible.  IMPRESSION: No hydronephrosis or acute renal findings.   Electronically Signed   By: Genevie Ann M.D.   On: 08/21/2014 16:13   Dg Chest Port 1 View  08/18/2014   CLINICAL DATA:  Shortness of breath today with exertion.  EXAM: PORTABLE CHEST - 1 VIEW  COMPARISON:  03/31/2014  FINDINGS: Heart is borderline in size. No confluent airspace opacities, effusions or edema. No acute bony abnormality.  IMPRESSION: No active disease.   Electronically Signed   By: Rolm Baptise M.D.   On: 08/18/2014 11:39     CBC  Recent Labs Lab 08/18/14 1115 08/19/14 0225 08/20/14 0333 08/21/14 0551 08/22/14 0420  WBC 12.8* 14.0* 12.6* 12.9* 11.3*  HGB 10.4* 10.3* 10.2* 10.0* 9.8*  HCT 33.6* 33.9* 34.8* 34.1*  33.2*  PLT 234 256 251 217 235  MCV 89.6 89.2 91.6 91.9 91.0  MCH 27.7 27.1 26.8 27.0 26.8  MCHC 31.0 30.4 29.3* 29.3* 29.5*  RDW 14.8 14.8 14.8 14.7 14.9  LYMPHSABS 1.0  --   --   --   --   MONOABS 0.7  --   --   --   --   EOSABS 0.0  --   --   --   --  BASOSABS 0.0  --   --   --   --     Chemistries   Recent Labs Lab 08/18/14 1115 08/18/14 1411 08/19/14 0225 08/20/14 0333 08/21/14 0551 08/21/14 1239 08/22/14 0420  NA 138  --  138 139 138 137 136  K 3.9  --  3.7 3.5 4.8 5.0 4.9  CL 96*  --  96* 96* 99* 96* 95*  CO2 31  --  33* 34* 31 33* 31  GLUCOSE 250*  --  228* 172* 201* 225* 248*  BUN 11  --  9 22* 31* 28* 32*  CREATININE 0.89  --  0.99 1.56* 1.39* 1.21* 1.30*  CALCIUM 9.2  --  8.6* 8.5* 8.5* 9.0 8.8*  MG  --  1.4*  --  2.2  --   --   --   AST 21  --   --  18 19  --  18  ALT 11*  --   --  10* 11*  --  12*  ALKPHOS 83  --   --  82 72  --  72  BILITOT 1.0  --   --  0.4 0.4  --  0.2*   ------------------------------------------------------------------------------------------------------------------ estimated creatinine clearance is 40.7 mL/min (by C-G formula based on Cr of 1.3). ------------------------------------------------------------------------------------------------------------------  Recent Labs  08/20/14 0333  HGBA1C 8.6*   ------------------------------------------------------------------------------------------------------------------ No results for input(s): CHOL, HDL, LDLCALC, TRIG, CHOLHDL, LDLDIRECT in the last 72 hours. ------------------------------------------------------------------------------------------------------------------ No results for input(s): TSH, T4TOTAL, T3FREE, THYROIDAB in the last 72 hours.  Invalid input(s): FREET3 ------------------------------------------------------------------------------------------------------------------ No results for input(s): VITAMINB12, FOLATE, FERRITIN, TIBC, IRON, RETICCTPCT in the last 72  hours.  Coagulation profile No results for input(s): INR, PROTIME in the last 168 hours.  No results for input(s): DDIMER in the last 72 hours.  Cardiac Enzymes No results for input(s): CKMB, TROPONINI, MYOGLOBIN in the last 168 hours.  Invalid input(s): CK ------------------------------------------------------------------------------------------------------------------ Invalid input(s): POCBNP   CBG:  Recent Labs Lab 08/21/14 1230 08/21/14 1728 08/21/14 2239 08/22/14 0801 08/22/14 1151  GLUCAP 204* 209* 248* 216* 208*       Studies: Mr Brain Wo Contrast  08/21/2014   CLINICAL DATA:  Mental status changes. Rapid atrial fibrillation. Confusion. Memory loss.  EXAM: MRI HEAD WITHOUT CONTRAST  TECHNIQUE: Multiplanar, multiecho pulse sequences of the brain and surrounding structures were obtained without intravenous contrast.  COMPARISON:  None.  FINDINGS: The diffusion-weighted images demonstrate no evidence for acute or subacute infarction.  The study is moderately degraded by patient motion. Moderate atrophy and periventricular white matter change bilaterally is somewhat advanced for age. No acute infarct, hemorrhage, or mass lesion is present. The ventricles are of normal size. No significant extra-axial fluid collections are present.  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 White matter changes extend into the brainstem.  Flow is present in the major intracranial arteries. Bilateral lens replacements are present. Circumferential mucosal thickening is present in the posterior sphenoid sinus bilaterally. The remaining paranasal sinuses and the mastoid air cells are clear.  Midline structures are within normal limits.  IMPRESSION: 1. No acute intracranial abnormality. 2. Age advanced atrophy and white matter disease. This likely reflects the sequela of chronic microvascular ischemia.   Electronically Signed   By: San Morelle M.D.   On: 08/21/2014 14:28   US  Renal  08/21/2014   CLINICAL DATA:  81 year old female with acute renal injury. Initial encounter.  EXAM: RENAL / URINARY TRACT ULTRASOUND COMPLETE  COMPARISON:  Lumbar  spine MRI 01/11/2013.  Chest CTA 02/01/2014.  FINDINGS: Right Kidney:  Length: 10.6 cm. No hydronephrosis. Cortical echogenicity within normal limits. No right renal mass.  Left Kidney:  Length: 10.1 cm. No hydronephrosis. Cortical echotexture within normal limits. No left renal mass.  Bladder:  Decompressed, Foley catheter balloon visible.  IMPRESSION: No hydronephrosis or acute renal findings.   Electronically Signed   By: Genevie Ann M.D.   On: 08/21/2014 16:13      Lab Results  Component Value Date   HGBA1C 8.6* 08/20/2014   HGBA1C 7.5* 02/02/2014   HGBA1C 7.0* 01/11/2014   Lab Results  Component Value Date   CREATININE 1.30* 08/22/2014       Scheduled Meds: . bisoprolol  10 mg Oral BID  . budesonide-formoterol  2 puff Inhalation BID  . calcium citrate  200 mg of elemental calcium Oral Daily  . cefTRIAXone (ROCEPHIN)  IV  1 g Intravenous Q24H  . cholecalciferol  1,000 Units Oral Daily  . famotidine  20 mg Oral Daily  . gabapentin  600 mg Oral BID  . glimepiride  2 mg Oral BID  . insulin aspart  0-15 Units Subcutaneous TID WC  . insulin aspart  0-5 Units Subcutaneous QHS  . losartan  25 mg Oral Daily  . mupirocin ointment   Nasal BID  . pantoprazole  40 mg Oral QAC breakfast  . rivaroxaban  15 mg Oral Q supper  . rosuvastatin  5 mg Oral QHS  . sodium chloride  3 mL Intravenous Q12H  . sodium chloride  3 mL Intravenous Q12H  . traZODone  50 mg Oral QHS   Continuous Infusions: . sodium chloride 20 mL/hr at 08/21/14 0800  . sodium chloride 250 mL (08/21/14 0800)  . diltiazem (CARDIZEM) infusion Stopped (08/20/14 0505)    Principal Problem:   Atrial fibrillation with RVR Active Problems:   Benign hypertensive heart disease without heart failure   Aortic stenosis, mild   Diabetic peripheral neuropathy  associated with type 2 diabetes mellitus   Essential hypertension   COPD GOLD II    Acute encephalopathy    Time spent: 45 minutes   [facility] Pager [phone]. If 7PM-7AM, please contact night-coverage at [email or web address], password [facility] 08/22/2014, 1:19 PM  LOS: 4 days

## 2014-08-22 NOTE — Clinical Social Work Placement (Signed)
   CLINICAL SOCIAL WORK PLACEMENT  NOTE  Date:  08/22/2014  Patient Details  Name: AVON MOLOCK MRN: [id number] Date of Birth: August 07, 1930  Clinical Social Work is seeking post-discharge placement for this patient at the [place] level of care (*CSW will initial, date and re-position this form in  chart as items are completed):  Yes   Patient/family provided with [facility]'s list of facilities offering this level of care within the geographic area requested by the patient (or if unable, by the patient's family).  Yes   Patient/family informed of their freedom to choose among providers that offer the needed level of care, that participate in Medicare, Medicaid or managed care program needed by the patient, have an available bed and are willing to accept the patient.  Yes   Patient/family informed of 's ownership interest in [facility] and [facility], as well as of the fact that they are under no obligation to receive care at these facilities.  PASRR submitted to EDS on       PASRR number received on       Existing PASRR number confirmed on 08/21/14     FL2 transmitted to all facilities in geographic area requested by pt/family on 08/21/14     FL2 transmitted to all facilities within larger geographic area on       Patient informed that his/her managed care company has contracts with or will negotiate with certain facilities, including the following:        Yes   Patient/family informed of bed offers received.  Patient chooses bed at [facility]     Physician recommends and patient chooses bed at      Patient to be transferred to   on  .  Patient to be transferred to facility by       Patient family notified on   of transfer.  Name of family member notified:        PHYSICIAN Please sign FL2, Please sign DNR, Please prepare priority discharge summary, including medications, Please prepare prescriptions      Additional Comment:    _______________________________________________ Berton Mount, [place]

## 2014-08-22 NOTE — Progress Notes (Signed)
Patient Name: [name] [name] Date of Encounter: 08/22/2014   SUBJECTIVE  Sleepy, open eyes to voice and goes back to sleep. Denies chest pain. Difficult to arose.   CURRENT MEDS . bisoprolol  10 mg Oral BID  . budesonide-formoterol  2 puff Inhalation BID  . calcium citrate  200 mg of elemental calcium Oral Daily  . cefTRIAXone (ROCEPHIN)  IV  1 g Intravenous Q24H  . cholecalciferol  1,000 Units Oral Daily  . famotidine  20 mg Oral Daily  . gabapentin  600 mg Oral BID  . glimepiride  2 mg Oral BID  . insulin aspart  0-15 Units Subcutaneous TID WC  . insulin aspart  0-5 Units Subcutaneous QHS  . losartan  25 mg Oral Daily  . mupirocin ointment   Nasal BID  . pantoprazole  40 mg Oral QAC breakfast  . rivaroxaban  15 mg Oral Q supper  . rosuvastatin  5 mg Oral QHS  . sodium chloride  3 mL Intravenous Q12H  . sodium chloride  3 mL Intravenous Q12H  . traZODone  50 mg Oral QHS    OBJECTIVE  Filed Vitals:   08/21/14 2034 08/22/14 0000 08/22/14 0400 08/22/14 0815  BP:  135/75 134/68   Pulse:  119 124   Temp:  98 F (36.7 C) 98.3 F (36.8 C)   TempSrc:  Oral    Resp:  19 18   Height:      Weight:   222 lb (100.699 kg)   SpO2: 98% 93% 98% 96%    Intake/Output Summary (Last 24 hours) at 08/22/14 0855 Last data filed at 08/22/14 0400  Gross per 24 hour  Intake     53 ml  Output    950 ml  Net   -897 ml   Filed Weights   08/20/14 0500 08/21/14 0400 08/22/14 0400  Weight: 224 lb (101.606 kg) 224 lb 6.4 oz (101.787 kg) 222 lb (100.699 kg)    PHYSICAL EXAM  General: sleepy, NAD. Neuro: Moves all extremities spontaneously. Psych: difficult to assess HEENT:  Normal  Neck: Supple without bruits or JVD. Lungs:  Resp regular and unlabored, CTA. Heart: Ir Ir, high rate,  no s3, s4, or murmurs. Abdomen: Soft, non-tender, non-distended, BS + x 4.  Extremities: No clubbing, cyanosis or edema. DP/PT/Radials 2+ and equal bilaterally.  Accessory Clinical  Findings  CBC  Recent Labs  08/21/14 0551 08/22/14 0420  WBC 12.9* 11.3*  HGB 10.0* 9.8*  HCT 34.1* 33.2*  MCV 91.9 91.0  PLT 217 008   Basic Metabolic Panel  Recent Labs  08/20/14 0333  08/21/14 1239 08/22/14 0420  NA 139  < > 137 136  K 3.5  < > 5.0 4.9  CL 96*  < > 96* 95*  CO2 34*  < > 33* 31  GLUCOSE 172*  < > 225* 248*  BUN 22*  < > 28* 32*  CREATININE 1.56*  < > 1.21* 1.30*  CALCIUM 8.5*  < > 9.0 8.8*  MG 2.2  --   --   --   < > = values in this interval not displayed. Liver Function Tests  Recent Labs  08/21/14 0551 08/22/14 0420  AST 19 18  ALT 11* 12*  ALKPHOS 72 72  BILITOT 0.4 0.2*  PROT 6.1* 6.8  ALBUMIN 3.2* 3.5   Hemoglobin A1C  Recent Labs  08/20/14 0333  HGBA1C 8.6*     TELE  afib at rate of 110-120.  Radiology/Studies  Mr Brain Wo Contrast  08/21/2014   CLINICAL DATA:  Mental status changes. Rapid atrial fibrillation. Confusion. Memory loss.  EXAM: MRI HEAD WITHOUT CONTRAST  TECHNIQUE: Multiplanar, multiecho pulse sequences of the brain and surrounding structures were obtained without intravenous contrast.  COMPARISON:  None.  FINDINGS: The diffusion-weighted images demonstrate no evidence for acute or subacute infarction.  The study is moderately degraded by patient motion. Moderate atrophy and periventricular white matter change bilaterally is somewhat advanced for age. No acute infarct, hemorrhage, or mass lesion is present. The ventricles are of normal size. No significant extra-axial fluid collections are present.  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 White matter changes extend into the brainstem.  Flow is present in the major intracranial arteries. Bilateral lens replacements are present. Circumferential mucosal thickening is present in the posterior sphenoid sinus bilaterally. The remaining paranasal sinuses and the mastoid air cells are clear.  Midline structures are within normal limits.  IMPRESSION: 1. No  acute intracranial abnormality. 2. Age advanced atrophy and white matter disease. This likely reflects the sequela of chronic microvascular ischemia.   Electronically Signed   By: San Morelle M.D.   On: 08/21/2014 14:28   US Renal  08/21/2014   CLINICAL DATA:  81 year old female with acute renal injury. Initial encounter.  EXAM: RENAL / URINARY TRACT ULTRASOUND COMPLETE  COMPARISON:  Lumbar spine MRI 01/11/2013.  Chest CTA 02/01/2014.  FINDINGS: Right Kidney:  Length: 10.6 cm. No hydronephrosis. Cortical echogenicity within normal limits. No right renal mass.  Left Kidney:  Length: 10.1 cm. No hydronephrosis. Cortical echotexture within normal limits. No left renal mass.  Bladder:  Decompressed, Foley catheter balloon visible.  IMPRESSION: No hydronephrosis or acute renal findings.   Electronically Signed   By: Genevie Ann M.D.   On: 08/21/2014 16:13   Dg Chest Port 1 View  08/18/2014   CLINICAL DATA:  Shortness of breath today with exertion.  EXAM: PORTABLE CHEST - 1 VIEW  COMPARISON:  03/31/2014  FINDINGS: Heart is borderline in size. No confluent airspace opacities, effusions or edema. No acute bony abnormality.  IMPRESSION: No active disease.   Electronically Signed   By: Rolm Baptise M.D.   On: 08/18/2014 11:39   Echo 08/19/14 LV EF: 55% -  60%  ------------------------------------------------------------------- Indications:   Atrial fibrillation - 427.31.  ------------------------------------------------------------------- History:  PMH:  Dyspnea. Coronary artery disease. Aortic valve disease. Chronic obstructive pulmonary disease. Risk factors: Hypertension. Diabetes mellitus.  ------------------------------------------------------------------- Study Conclusions  - Left ventricle: The cavity size was normal. There was moderate concentric hypertrophy. Systolic function was normal. The estimated ejection fraction was in the range of 55% to 60%. Doppler parameters are  consistent with elevated mean left atrial filling pressure. - Aortic valve: There was mild to moderate stenosis. There was trivial regurgitation. Valve area (VTI): 1.21 cm^2. Valve area (Vmax): 1.14 cm^2. Valve area (Vmean): 1.25 cm^2. - Mitral valve: Calcified annulus. There was mild regurgitation directed centrally. Valve area by continuity equation (using LVOT flow): 1.11 cm^2. - Left atrium: The atrium was moderately dilated. - Right ventricle: The cavity size was mildly dilated. - Right atrium: The atrium was moderately dilated. - Pulmonary arteries: Systolic pressure was mildly increased. PA peak pressure: 36 mm Hg (S).  ASSESSMENT AND PLAN 1. A fib with RVR- CancelledTEE/DCCV yesterday, Schedule for today at 1600. Hopefully anesthesia coverage available.  Decreased ARB and increased BB yesterday. Rate still in 110-120s.  - Continue current dose of Xarelto 15mg   due to renal dysfuncation  2. Mild to moderate aortic stenosis - Echo 8/6 Valve area (VTI): 1.21 cm^2. Valve area, (Vmax): 1.14 cm^2. Valve area (Vmean): 1.25 cm^2. 3. Diabetes mellitus with peripheral neuropathy - HgbA1c of 8.6 - per primary 4. Hypertensive heart disease 5. Encephalopathy and some confusion - Sleepy today 6. Hypokalemia- supplement given - K today 4.9 7. AKI - Creatinine further increased to 1.3. (can hold ARB until Cr improves)     Signed, Bhagat,Bhavinkumar PA-C Pager [phone] Agree with above assessment and plan.  Awaiting TEE today.  Hopefully anesthesia coverage can be arranged for possible cardioversion at the same time. She remains confused.

## 2014-08-22 NOTE — Progress Notes (Signed)
OT Cancellation Note  Patient Details Name: CHARLOT GOUIN MRN: [id number] DOB: 02/19/1930   Cancelled Treatment:    Reason Eval/Treat Not Completed: Fatigue/lethargy limiting ability to participate  Benito Mccreedy OTR/L [phone] 08/22/2014, 9:01 AM

## 2014-08-23 ENCOUNTER — Encounter (HOSPITAL_COMMUNITY)
Admission: EM | Disposition: A | Discharge status: Skilled Nursing Facility | Payer: Self-pay | Source: Home / Self Care | Attending: Internal Medicine

## 2014-08-23 ENCOUNTER — Inpatient Hospital Stay (HOSPITAL_COMMUNITY): Payer: Medicare HMO

## 2014-08-23 ENCOUNTER — Inpatient Hospital Stay (HOSPITAL_COMMUNITY): Payer: Medicare HMO | Admitting: Anesthesiology

## 2014-08-23 ENCOUNTER — Encounter (HOSPITAL_COMMUNITY): Payer: Self-pay | Admitting: *Deleted

## 2014-08-23 DIAGNOSIS — E785 Hyperlipidemia, unspecified: Secondary | ICD-10-CM

## 2014-08-23 HISTORY — PX: TEE WITHOUT CARDIOVERSION: SHX5443

## 2014-08-23 HISTORY — PX: CARDIOVERSION: SHX1299

## 2014-08-23 LAB — COMPREHENSIVE METABOLIC PANEL
ALT: 13 U/L — ABNORMAL LOW (ref 14–54)
AST: 20 U/L (ref 15–41)
Albumin: 3.4 g/dL — ABNORMAL LOW (ref 3.5–5.0)
Alkaline Phosphatase: 76 U/L (ref 38–126)
Anion gap: 7 (ref 5–15)
BUN: 34 mg/dL — AB (ref 6–20)
CO2: 36 mmol/L — ABNORMAL HIGH (ref 22–32)
Calcium: 9.2 mg/dL (ref 8.9–10.3)
Chloride: 98 mmol/L — ABNORMAL LOW (ref 101–111)
Creatinine, Ser: 1.29 mg/dL — ABNORMAL HIGH (ref 0.44–1.00)
GFR calc Af Amer: 43 mL/min — ABNORMAL LOW (ref >60)
GFR calc non Af Amer: 37 mL/min — ABNORMAL LOW (ref >60)
GLUCOSE: 202 mg/dL — AB (ref 65–99)
Potassium: 5.2 mmol/L — ABNORMAL HIGH (ref 3.5–5.1)
Sodium: 141 mmol/L (ref 135–145)
TOTAL PROTEIN: 6.6 g/dL (ref 6.5–8.1)
Total Bilirubin: 0.4 mg/dL (ref 0.3–1.2)

## 2014-08-23 LAB — CBC
HCT: 33.9 % — ABNORMAL LOW (ref 36.0–46.0)
Hemoglobin: 10.2 g/dL — ABNORMAL LOW (ref 12.0–15.0)
MCH: 27.2 pg (ref 26.0–34.0)
MCHC: 30.1 g/dL (ref 30.0–36.0)
MCV: 90.4 fL (ref 78.0–100.0)
Platelets: 221 10*3/uL (ref 150–400)
RBC: 3.75 MIL/uL — AB (ref 3.87–5.11)
RDW: 15 % (ref 11.5–15.5)
WBC: 10.1 10*3/uL (ref 4.0–10.5)

## 2014-08-23 LAB — PROTIME-INR
INR: 1.45 (ref 0.00–1.49)
Prothrombin Time: 17.7 seconds — ABNORMAL HIGH (ref 11.6–15.2)

## 2014-08-23 LAB — MAGNESIUM: Magnesium: 2 mg/dL (ref 1.7–2.4)

## 2014-08-23 LAB — BASIC METABOLIC PANEL
Anion gap: 10 (ref 5–15)
BUN: 33 mg/dL — AB (ref 6–20)
CO2: 31 mmol/L (ref 22–32)
Calcium: 9.2 mg/dL (ref 8.9–10.3)
Chloride: 96 mmol/L — ABNORMAL LOW (ref 101–111)
Creatinine, Ser: 1.23 mg/dL — ABNORMAL HIGH (ref 0.44–1.00)
GFR calc Af Amer: 46 mL/min — ABNORMAL LOW (ref >60)
GFR calc non Af Amer: 40 mL/min — ABNORMAL LOW (ref >60)
Glucose, Bld: 201 mg/dL — ABNORMAL HIGH (ref 65–99)
Potassium: 4.9 mmol/L (ref 3.5–5.1)
SODIUM: 137 mmol/L (ref 135–145)

## 2014-08-23 LAB — GLUCOSE, CAPILLARY
GLUCOSE-CAPILLARY: 200 mg/dL — AB (ref 65–99)
GLUCOSE-CAPILLARY: 233 mg/dL — AB (ref 65–99)
Glucose-Capillary: 176 mg/dL — ABNORMAL HIGH (ref 65–99)
Glucose-Capillary: 167 mg/dL — ABNORMAL HIGH (ref 65–99)
Glucose-Capillary: 167 mg/dL — ABNORMAL HIGH (ref 65–99)

## 2014-08-23 SURGERY — ECHOCARDIOGRAM, TRANSESOPHAGEAL
Anesthesia: Monitor Anesthesia Care

## 2014-08-23 MED ORDER — SODIUM CHLORIDE 0.9 % IV SOLN: INTRAVENOUS | Status: DC

## 2014-08-23 MED ORDER — BUTAMBEN-TETRACAINE-BENZOCAINE 2-2-14 % EX AERO
INHALATION_SPRAY | CUTANEOUS | Status: DC | PRN
  Administered 2014-08-23: 2 via TOPICAL

## 2014-08-23 MED ORDER — RIVAROXABAN 20 MG PO TABS
20.0000 mg | ORAL_TABLET | Freq: Every day | ORAL | Status: DC
  Administered 2014-08-23: 20 mg via ORAL
  Filled 2014-08-23: qty 1

## 2014-08-23 MED ORDER — LIDOCAINE HCL (CARDIAC) 20 MG/ML IV SOLN
INTRAVENOUS | Status: DC | PRN
  Administered 2014-08-23: 40 mg via INTRAVENOUS

## 2014-08-23 MED ORDER — LACTATED RINGERS IV SOLN
INTRAVENOUS | Status: DC | PRN
  Administered 2014-08-23: 14:00:00 via INTRAVENOUS

## 2014-08-23 MED ORDER — SODIUM POLYSTYRENE SULFONATE 15 GM/60ML PO SUSP
15.0000 g | Freq: Once | ORAL | Status: DC
  Filled 2014-08-23: qty 60

## 2014-08-23 MED ORDER — PROPOFOL 10 MG/ML IV BOLUS
INTRAVENOUS | Status: DC | PRN
  Administered 2014-08-23 (×4): 10 mg via INTRAVENOUS
  Administered 2014-08-23: 30 mg via INTRAVENOUS

## 2014-08-23 NOTE — Anesthesia Procedure Notes (Signed)
Procedure Name: MAC Date/Time: 08/23/2014 2:00 PM Performed by: Barrington Ellison Pre-anesthesia Checklist: Patient being monitored, Patient identified, Emergency Drugs available, Suction available and Timeout performed Patient Re-evaluated:Patient Re-evaluated prior to inductionOxygen Delivery Method: Nasal cannula

## 2014-08-23 NOTE — H&P (View-Only) (Signed)
Patient Name: [name] [name] Date of Encounter: 08/23/2014   SUBJECTIVE  Some what confused. Wants to get up from bed. Agitated. No chest pain.   CURRENT MEDS . bisoprolol  10 mg Oral BID  . budesonide-formoterol  2 puff Inhalation BID  . calcium citrate  200 mg of elemental calcium Oral Daily  . cefTRIAXone (ROCEPHIN)  IV  1 g Intravenous Q24H  . cholecalciferol  1,000 Units Oral Daily  . famotidine  20 mg Oral Daily  . gabapentin  600 mg Oral BID  . glimepiride  2 mg Oral BID  . insulin aspart  0-20 Units Subcutaneous TID WC  . insulin aspart  0-5 Units Subcutaneous QHS  . losartan  25 mg Oral Daily  . mupirocin ointment   Nasal BID  . pantoprazole  40 mg Oral QAC breakfast  . rivaroxaban  15 mg Oral Q supper  . rosuvastatin  5 mg Oral QHS  . sodium chloride  3 mL Intravenous Q12H  . sodium chloride  3 mL Intravenous Q12H  . traZODone  50 mg Oral QHS    OBJECTIVE  Filed Vitals:   08/22/14 2009 08/22/14 2107 08/23/14 0000 08/23/14 0400  BP: 126/84  131/71 117/75  Pulse: 110  82 96  Temp: 98 F (36.7 C)  97.7 F (36.5 C) 98.3 F (36.8 C)  TempSrc: Oral  Oral Oral  Resp: 17  28 25   Height:      Weight:    221 lb (100.245 kg)  SpO2: 98% 98% 97% 98%    Intake/Output Summary (Last 24 hours) at 08/23/14 0930 Last data filed at 08/23/14 0500  Gross per 24 hour  Intake     60 ml  Output    700 ml  Net   -640 ml   Filed Weights   08/21/14 0400 08/22/14 0400 08/23/14 0400  Weight: 224 lb 6.4 oz (101.787 kg) 222 lb (100.699 kg) 221 lb (100.245 kg)    PHYSICAL EXAM  General: somewhat agitated and confused,  NAD. Neuro: Moves all extremities spontaneously. Psych: difficult to assess HEENT:  Normal  Neck: Supple without bruits or JVD. Lungs:  Resp regular and unlabored, CTA. Heart: Ir Ir, high rate,  no s3, s4, or murmurs. Abdomen: Soft, non-tender, non-distended, BS + x 4.  Extremities: No clubbing, cyanosis or edema. DP 1+ and equal bilaterally. Feet cold.     Accessory Clinical Findings  CBC  Recent Labs  08/22/14 0420 08/23/14 0348  WBC 11.3* 10.1  HGB 9.8* 10.2*  HCT 33.2* 33.9*  MCV 91.0 90.4  PLT 235 546   Basic Metabolic Panel  Recent Labs  08/22/14 0420 08/23/14 0348  NA 136 141  K 4.9 5.2*  CL 95* 98*  CO2 31 36*  GLUCOSE 248* 202*  BUN 32* 34*  CREATININE 1.30* 1.29*  CALCIUM 8.8* 9.2  MG  --  2.0   Liver Function Tests  Recent Labs  08/22/14 0420 08/23/14 0348  AST 18 20  ALT 12* 13*  ALKPHOS 72 76  BILITOT 0.2* 0.4  PROT 6.8 6.6  ALBUMIN 3.5 3.4*   Hemoglobin A1C No results for input(s): HGBA1C in the last 72 hours.   TELE  afib at rate of 110-120. PVCs.   Radiology/Studies  Mr Brain Wo Contrast  08/21/2014   CLINICAL DATA:  Mental status changes. Rapid atrial fibrillation. Confusion. Memory loss.  EXAM: MRI HEAD WITHOUT CONTRAST  TECHNIQUE: Multiplanar, multiecho pulse sequences of the brain and surrounding structures were  obtained without intravenous contrast.  COMPARISON:  None.  FINDINGS: The diffusion-weighted images demonstrate no evidence for acute or subacute infarction.  The study is moderately degraded by patient motion. Moderate atrophy and periventricular white matter change bilaterally is somewhat advanced for age. No acute infarct, hemorrhage, or mass lesion is present. The ventricles are of normal size. No significant extra-axial fluid collections are present.  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 White matter changes extend into the brainstem.  Flow is present in the major intracranial arteries. Bilateral lens replacements are present. Circumferential mucosal thickening is present in the posterior sphenoid sinus bilaterally. The remaining paranasal sinuses and the mastoid air cells are clear.  Midline structures are within normal limits.  IMPRESSION: 1. No acute intracranial abnormality. 2. Age advanced atrophy and white matter disease. This likely reflects the sequela  of chronic microvascular ischemia.   Electronically Signed   By: San Morelle M.D.   On: 08/21/2014 14:28   US Renal  08/21/2014   CLINICAL DATA:  81 year old female with acute renal injury. Initial encounter.  EXAM: RENAL / URINARY TRACT ULTRASOUND COMPLETE  COMPARISON:  Lumbar spine MRI 01/11/2013.  Chest CTA 02/01/2014.  FINDINGS: Right Kidney:  Length: 10.6 cm. No hydronephrosis. Cortical echogenicity within normal limits. No right renal mass.  Left Kidney:  Length: 10.1 cm. No hydronephrosis. Cortical echotexture within normal limits. No left renal mass.  Bladder:  Decompressed, Foley catheter balloon visible.  IMPRESSION: No hydronephrosis or acute renal findings.   Electronically Signed   By: Genevie Ann M.D.   On: 08/21/2014 16:13   Dg Chest Port 1 View  08/18/2014   CLINICAL DATA:  Shortness of breath today with exertion.  EXAM: PORTABLE CHEST - 1 VIEW  COMPARISON:  03/31/2014  FINDINGS: Heart is borderline in size. No confluent airspace opacities, effusions or edema. No acute bony abnormality.  IMPRESSION: No active disease.   Electronically Signed   By: Rolm Baptise M.D.   On: 08/18/2014 11:39   Echo 08/19/14 LV EF: 55% -  60%  ------------------------------------------------------------------- Indications:   Atrial fibrillation - 427.31.  ------------------------------------------------------------------- History:  PMH:  Dyspnea. Coronary artery disease. Aortic valve disease. Chronic obstructive pulmonary disease. Risk factors: Hypertension. Diabetes mellitus.  ------------------------------------------------------------------- Study Conclusions  - Left ventricle: The cavity size was normal. There was moderate concentric hypertrophy. Systolic function was normal. The estimated ejection fraction was in the range of 55% to 60%. Doppler parameters are consistent with elevated mean left atrial filling pressure. - Aortic valve: There was mild to moderate stenosis.  There was trivial regurgitation. Valve area (VTI): 1.21 cm^2. Valve area (Vmax): 1.14 cm^2. Valve area (Vmean): 1.25 cm^2. - Mitral valve: Calcified annulus. There was mild regurgitation directed centrally. Valve area by continuity equation (using LVOT flow): 1.11 cm^2. - Left atrium: The atrium was moderately dilated. - Right ventricle: The cavity size was mildly dilated. - Right atrium: The atrium was moderately dilated. - Pulmonary arteries: Systolic pressure was mildly increased. PA peak pressure: 36 mm Hg (S).  ASSESSMENT AND PLAN 1. A fib with RVR - TEE/DCCV today at 1330. Decreased ARB and increased BB yesterday. Rate still in 110-120s.  - Continue current dose of Xarelto 15mg  due to renal dysfuncation  2. Mild to moderate aortic stenosis - Echo 8/6 Valve area (VTI): 1.21 cm^2. Valve area, (Vmax): 1.14 cm^2. Valve area (Vmean): 1.25 cm^2. 3. Diabetes mellitus with peripheral neuropathy - HgbA1c of 8.6 - per primary 4. Hypertensive heart disease 5. Encephalopathy  and some confusion - Appears confused and agitated.  6. Hypokalemia- supplement given - K today 5.2 7. AKI - Creatinine stable 1.29   Signed, Bhagat,Bhavinkumar PA-C Pager [phone]  Today she is resting comfortably.  Her disorientation and confusion appear to be somewhat improved. Awaiting TEE/DCCV this afternoon.  Heart rate accelerates quickly with any activity.

## 2014-08-23 NOTE — Progress Notes (Signed)
Patient Name: [name] [name] Date of Encounter: 08/23/2014   SUBJECTIVE  Some what confused. Wants to get up from bed. Agitated. No chest pain.   CURRENT MEDS . bisoprolol  10 mg Oral BID  . budesonide-formoterol  2 puff Inhalation BID  . calcium citrate  200 mg of elemental calcium Oral Daily  . cefTRIAXone (ROCEPHIN)  IV  1 g Intravenous Q24H  . cholecalciferol  1,000 Units Oral Daily  . famotidine  20 mg Oral Daily  . gabapentin  600 mg Oral BID  . glimepiride  2 mg Oral BID  . insulin aspart  0-20 Units Subcutaneous TID WC  . insulin aspart  0-5 Units Subcutaneous QHS  . losartan  25 mg Oral Daily  . mupirocin ointment   Nasal BID  . pantoprazole  40 mg Oral QAC breakfast  . rivaroxaban  15 mg Oral Q supper  . rosuvastatin  5 mg Oral QHS  . sodium chloride  3 mL Intravenous Q12H  . sodium chloride  3 mL Intravenous Q12H  . traZODone  50 mg Oral QHS    OBJECTIVE  Filed Vitals:   08/22/14 2009 08/22/14 2107 08/23/14 0000 08/23/14 0400  BP: 126/84  131/71 117/75  Pulse: 110  82 96  Temp: 98 F (36.7 C)  97.7 F (36.5 C) 98.3 F (36.8 C)  TempSrc: Oral  Oral Oral  Resp: 17  28 25   Height:      Weight:    221 lb (100.245 kg)  SpO2: 98% 98% 97% 98%    Intake/Output Summary (Last 24 hours) at 08/23/14 0930 Last data filed at 08/23/14 0500  Gross per 24 hour  Intake     60 ml  Output    700 ml  Net   -640 ml   Filed Weights   08/21/14 0400 08/22/14 0400 08/23/14 0400  Weight: 224 lb 6.4 oz (101.787 kg) 222 lb (100.699 kg) 221 lb (100.245 kg)    PHYSICAL EXAM  General: somewhat agitated and confused,  NAD. Neuro: Moves all extremities spontaneously. Psych: difficult to assess HEENT:  Normal  Neck: Supple without bruits or JVD. Lungs:  Resp regular and unlabored, CTA. Heart: Ir Ir, high rate,  no s3, s4, or murmurs. Abdomen: Soft, non-tender, non-distended, BS + x 4.  Extremities: No clubbing, cyanosis or edema. DP 1+ and equal bilaterally. Feet cold.     Accessory Clinical Findings  CBC  Recent Labs  08/22/14 0420 08/23/14 0348  WBC 11.3* 10.1  HGB 9.8* 10.2*  HCT 33.2* 33.9*  MCV 91.0 90.4  PLT 235 935   Basic Metabolic Panel  Recent Labs  08/22/14 0420 08/23/14 0348  NA 136 141  K 4.9 5.2*  CL 95* 98*  CO2 31 36*  GLUCOSE 248* 202*  BUN 32* 34*  CREATININE 1.30* 1.29*  CALCIUM 8.8* 9.2  MG  --  2.0   Liver Function Tests  Recent Labs  08/22/14 0420 08/23/14 0348  AST 18 20  ALT 12* 13*  ALKPHOS 72 76  BILITOT 0.2* 0.4  PROT 6.8 6.6  ALBUMIN 3.5 3.4*   Hemoglobin A1C No results for input(s): HGBA1C in the last 72 hours.   TELE  afib at rate of 110-120. PVCs.   Radiology/Studies  Mr Brain Wo Contrast  08/21/2014   CLINICAL DATA:  Mental status changes. Rapid atrial fibrillation. Confusion. Memory loss.  EXAM: MRI HEAD WITHOUT CONTRAST  TECHNIQUE: Multiplanar, multiecho pulse sequences of the brain and surrounding structures were  obtained without intravenous contrast.  COMPARISON:  None.  FINDINGS: The diffusion-weighted images demonstrate no evidence for acute or subacute infarction.  The study is moderately degraded by patient motion. Moderate atrophy and periventricular white matter change bilaterally is somewhat advanced for age. No acute infarct, hemorrhage, or mass lesion is present. The ventricles are of normal size. No significant extra-axial fluid collections are present.  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 White matter changes extend into the brainstem.  Flow is present in the major intracranial arteries. Bilateral lens replacements are present. Circumferential mucosal thickening is present in the posterior sphenoid sinus bilaterally. The remaining paranasal sinuses and the mastoid air cells are clear.  Midline structures are within normal limits.  IMPRESSION: 1. No acute intracranial abnormality. 2. Age advanced atrophy and white matter disease. This likely reflects the sequela  of chronic microvascular ischemia.   Electronically Signed   By: San Morelle M.D.   On: 08/21/2014 14:28   US Renal  08/21/2014   CLINICAL DATA:  81 year old female with acute renal injury. Initial encounter.  EXAM: RENAL / URINARY TRACT ULTRASOUND COMPLETE  COMPARISON:  Lumbar spine MRI 01/11/2013.  Chest CTA 02/01/2014.  FINDINGS: Right Kidney:  Length: 10.6 cm. No hydronephrosis. Cortical echogenicity within normal limits. No right renal mass.  Left Kidney:  Length: 10.1 cm. No hydronephrosis. Cortical echotexture within normal limits. No left renal mass.  Bladder:  Decompressed, Foley catheter balloon visible.  IMPRESSION: No hydronephrosis or acute renal findings.   Electronically Signed   By: Genevie Ann M.D.   On: 08/21/2014 16:13   Dg Chest Port 1 View  08/18/2014   CLINICAL DATA:  Shortness of breath today with exertion.  EXAM: PORTABLE CHEST - 1 VIEW  COMPARISON:  03/31/2014  FINDINGS: Heart is borderline in size. No confluent airspace opacities, effusions or edema. No acute bony abnormality.  IMPRESSION: No active disease.   Electronically Signed   By: Rolm Baptise M.D.   On: 08/18/2014 11:39   Echo 08/19/14 LV EF: 55% -  60%  ------------------------------------------------------------------- Indications:   Atrial fibrillation - 427.31.  ------------------------------------------------------------------- History:  PMH:  Dyspnea. Coronary artery disease. Aortic valve disease. Chronic obstructive pulmonary disease. Risk factors: Hypertension. Diabetes mellitus.  ------------------------------------------------------------------- Study Conclusions  - Left ventricle: The cavity size was normal. There was moderate concentric hypertrophy. Systolic function was normal. The estimated ejection fraction was in the range of 55% to 60%. Doppler parameters are consistent with elevated mean left atrial filling pressure. - Aortic valve: There was mild to moderate stenosis.  There was trivial regurgitation. Valve area (VTI): 1.21 cm^2. Valve area (Vmax): 1.14 cm^2. Valve area (Vmean): 1.25 cm^2. - Mitral valve: Calcified annulus. There was mild regurgitation directed centrally. Valve area by continuity equation (using LVOT flow): 1.11 cm^2. - Left atrium: The atrium was moderately dilated. - Right ventricle: The cavity size was mildly dilated. - Right atrium: The atrium was moderately dilated. - Pulmonary arteries: Systolic pressure was mildly increased. PA peak pressure: 36 mm Hg (S).  ASSESSMENT AND PLAN 1. A fib with RVR - TEE/DCCV today at 1330. Decreased ARB and increased BB yesterday. Rate still in 110-120s.  - Continue current dose of Xarelto 15mg  due to renal dysfuncation  2. Mild to moderate aortic stenosis - Echo 8/6 Valve area (VTI): 1.21 cm^2. Valve area, (Vmax): 1.14 cm^2. Valve area (Vmean): 1.25 cm^2. 3. Diabetes mellitus with peripheral neuropathy - HgbA1c of 8.6 - per primary 4. Hypertensive heart disease 5. Encephalopathy  and some confusion - Appears confused and agitated.  6. Hypokalemia- supplement given - K today 5.2 7. AKI - Creatinine stable 1.29   Signed, Bhagat,Bhavinkumar PA-C Pager [phone]  Today she is resting comfortably.  Her disorientation and confusion appear to be somewhat improved. Awaiting TEE/DCCV this afternoon.  Heart rate accelerates quickly with any activity.

## 2014-08-23 NOTE — Transfer of Care (Signed)
Immediate Anesthesia Transfer of Care Note  Patient: [name] [name]  Procedure(s) Performed: Procedure(s): TRANSESOPHAGEAL ECHOCARDIOGRAM (TEE) (N/A) CARDIOVERSION (N/A)  Patient Location: Endoscopy Unit  Anesthesia Type:MAC  Level of Consciousness: lethargic and responds to stimulation  Airway & Oxygen Therapy: Patient Spontanous Breathing and Patient connected to nasal cannula oxygen  Post-op Assessment: Report given to RN  Post vital signs: Reviewed and stable  Last Vitals:  Filed Vitals:   08/23/14 1423  BP: 99/45  Pulse:   Temp:   Resp:     Complications: No apparent anesthesia complications

## 2014-08-23 NOTE — Progress Notes (Signed)
[facility] PROGRESS NOTE  [name] [name] DXA:[id number] DOB: [birth year] DOA: 08/18/2014 PCP: Cari Caraway, MD  Assessment/Plan: Principal Problem:   Atrial fibrillation with RVR Active Problems:   Benign hypertensive heart disease without heart failure   Aortic stenosis, mild   Diabetic peripheral neuropathy associated with type 2 diabetes mellitus   Essential hypertension   COPD GOLD II    Acute encephalopathy    Atrial fibrillation with RVR Discontinued  IV Cardizem drip due to low BP 8/7  , continue beta blocker, patient anticipated to have  TEE/DCCV TODAY.Decreased ARB and increased BB yesterday. Rate still in 110-120s-Patient has mild aortic stenosis on prior echo and cardiac catheter in 2015. 2-D echo EF: 55% -  60% . Mild MR , repeat  Magnesium 2.2  ,  TSH within normal limits -Her CHADS 2 vasc score is 5, currently on anticoagulation with Xarelto 15mg  due to renal dysfuncation Plan is for TEE cardioversion today,   Acute kidney injury, likely prerenal Creatinine has gone up from 0.99>> 1.56> 1.39> 1.29 improving ,  , discontinued IV Lasix,  potassium 5.2 Reduced losartan to 25 once a day,  Recheck renal function in a.m., try to keep systolic blood pressure greater than 120 Renal ultrasound negative for hydronephrosis or obstructive uropathy We'll give 1 dose of kayexalate and recheck potassium tomorrow   Anxiety/Insomnia-patient started on Klonopin and trazodone, appears to be less agitated today,  Leukocytosis , resolved-urine culture positive for Klebsiella pneumonia> 100,000 colonies, continue IV Rocephin . May be switched to Teague 300 twice a day 5 days at discharge  Mild acute on chronic diastolic CHF Discontinued IV Lasix 40 mg twice daily given jump in her creatinine. Monitor strict I/O and daily weight. Continue aspirin, beta blocker, ARB and statin    ? Acute encephalopathy, resolved No clear etiology.  . Mental status appears at baseline  at this time. ABG done 8/5 showed hypercapnia . TSH normal, severe vitamin B-12 deficiency [125]  will start vitamin B-12 injections weekly . Will monitor for now. Will need neuropsychiatry evaluation in the outpatient setting for dementia . Discussed with daughter    Benign hypertensive heart disease without heart failure Blood pressure stable. Continue losartan (dose reduced), bisoprolol,   Aortic stenosis, mild Repeat echo with results as above   Diabetic peripheral neuropathy associated with type 2 diabetes mellitus Continue Neurontin.   Essential hypertension Continue home medications as above   COPD GOLD II  No acute symptoms. Will resume home inhalers. Place on when necessary nebs, not on home O2. Follows with Dr. Melvyn Novas as outpatient   Diabetes mellitus Hemoglobin A1c 8.6, hold metformin, change to resistant sliding scale  Diet:cardiac  DVT prophylaxis: sq lovenox Code Status:      Code Status Orders        Start     Ordered   08/18/14 1719  Do not attempt resuscitation (DNR)   Continuous    Question Answer Comment  In the event of cardiac or respiratory ARREST Do not call a "code blue"   In the event of cardiac or respiratory ARREST Do not perform Intubation, CPR, defibrillation or ACLS   In the event of cardiac or respiratory ARREST Use medication by any route, position, wound care, and other measures to relive pain and suffering. May use oxygen, suction and manual treatment of airway obstruction as needed for comfort.      08/18/14 1718     Family Communication: family updated about patient's clinical  progress Disposition Plan:  PT/OT eval recommends SNF, anticipate discharge tomorrow    Brief narrative: 81 y.o. year old female with a history of hypertension, cholestasis 2, peripheral neuropathy, diastolic CHF, diabetes mellitus type 2, mild aortic stenosis and normal cors by cath 12/2013. She has a history of PACs but not atrial fib.   She was brought  to the ER by EMS for [name] changes and hypoxia. She was in rapid atrial fibrillation and cardiology was asked to evaluate her.   [name] [name] is oriented to name and place, but not date. She has trouble recalling events. She cannot say how long she has been in atrial fib. She denies palpitations or chest pain. She normally walks with a cane and her activity level is poor at baseline. On a good day, she cannot walk a block or do steps.  She cannot say how long her her SOB has been getting worse, does not remember PND or orthopnea. According to her sister, [name] [name] has been getting gradually worse over the last 9 months or so, worse in the last week or so.   Consultants:  Cardiology  Procedures:  None  Antibiotics: Anti-infectives    Start     Dose/Rate Route Frequency Ordered Stop   08/19/14 1330  cefTRIAXone (ROCEPHIN) 1 g in dextrose 5 % 50 mL IVPB     1 g 100 mL/hr over 30 Minutes Intravenous Every 24 hours 08/19/14 1248           HPI/Subjective: No chest pain or shortness of breath, heart rate is still uncontrolled    Objective: Filed Vitals:   08/22/14 2009 08/22/14 2107 08/23/14 0000 08/23/14 0400  BP: 126/84  131/71 117/75  Pulse: 110  82 96  Temp: 98 F (36.7 C)  97.7 F (36.5 C) 98.3 F (36.8 C)  TempSrc: Oral  Oral Oral  Resp: 17  28 25   Height:      Weight:    100.245 kg (221 lb)  SpO2: 98% 98% 97% 98%    Intake/Output Summary (Last 24 hours) at 08/23/14 1146 Last data filed at 08/23/14 0500  Gross per 24 hour  Intake     60 ml  Output    700 ml  Net   -640 ml    Exam:  General: No acute respiratory distress Lungs: Clear to auscultation bilaterally without wheezes or crackles Cardiovascular: Regular rate and rhythm without murmur gallop or rub normal S1 and S2 Abdomen: Nontender, nondistended, soft, bowel sounds positive, no rebound, no ascites, no appreciable mass Extremities: No significant cyanosis, clubbing, or edema bilateral lower  extremities     Data Review   Micro Results Recent Results (from the past 240 hour(s))  Urine culture     Status: None   Collection Time: 08/18/14 10:55 AM  Result Value Ref Range Status   Specimen Description URINE, CLEAN CATCH  Final   Special Requests NONE  Final   Culture >=100,000 COLONIES/mL KLEBSIELLA PNEUMONIAE  Final   Report Status 08/20/2014 FINAL  Final   Organism ID, Bacteria KLEBSIELLA PNEUMONIAE  Final      Susceptibility   Klebsiella pneumoniae - MIC*    AMPICILLIN 16 RESISTANT Resistant     CEFAZOLIN <=4 SENSITIVE Sensitive     CEFTRIAXONE <=1 SENSITIVE Sensitive     CIPROFLOXACIN <=0.25 SENSITIVE Sensitive     GENTAMICIN <=1 SENSITIVE Sensitive     IMIPENEM <=0.25 SENSITIVE Sensitive     NITROFURANTOIN 32 SENSITIVE Sensitive     TRIMETH/SULFA >=  320 RESISTANT Resistant     AMPICILLIN/SULBACTAM 4 SENSITIVE Sensitive     PIP/TAZO <=4 SENSITIVE Sensitive     * >=100,000 COLONIES/mL KLEBSIELLA PNEUMONIAE  MRSA PCR Screening     Status: Abnormal   Collection Time: 08/18/14  5:52 PM  Result Value Ref Range Status   MRSA by PCR POSITIVE (A) NEGATIVE Final    Comment:        The GeneXpert MRSA Assay (FDA approved for NASAL specimens only), is one component of a comprehensive MRSA colonization surveillance program. It is not intended to diagnose MRSA infection nor to guide or monitor treatment for MRSA infections. RESULT CALLED TO, READ BACK BY AND VERIFIED WITH: Mathis Bud RN [id number] 08/18/14 A BROWNING     Radiology Reports Mr Brain Wo Contrast  08/21/2014   CLINICAL DATA:  Mental status changes. Rapid atrial fibrillation. Confusion. Memory loss.  EXAM: MRI HEAD WITHOUT CONTRAST  TECHNIQUE: Multiplanar, multiecho pulse sequences of the brain and surrounding structures were obtained without intravenous contrast.  COMPARISON:  None.  FINDINGS: The diffusion-weighted images demonstrate no evidence for acute or subacute infarction.  The study is moderately degraded  by patient motion. Moderate atrophy and periventricular white matter change bilaterally is somewhat advanced for age. No acute infarct, hemorrhage, or mass lesion is present. The ventricles are of normal size. No significant extra-axial fluid collections are present.  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 White matter changes extend into the brainstem.  Flow is present in the major intracranial arteries. Bilateral lens replacements are present. Circumferential mucosal thickening is present in the posterior sphenoid sinus bilaterally. The remaining paranasal sinuses and the mastoid air cells are clear.  Midline structures are within normal limits.  IMPRESSION: 1. No acute intracranial abnormality. 2. Age advanced atrophy and white matter disease. This likely reflects the sequela of chronic microvascular ischemia.   Electronically Signed   By: San Morelle M.D.   On: 08/21/2014 14:28   US Renal  08/21/2014   CLINICAL DATA:  81 year old female with acute renal injury. Initial encounter.  EXAM: RENAL / URINARY TRACT ULTRASOUND COMPLETE  COMPARISON:  Lumbar spine MRI 01/11/2013.  Chest CTA 02/01/2014.  FINDINGS: Right Kidney:  Length: 10.6 cm. No hydronephrosis. Cortical echogenicity within normal limits. No right renal mass.  Left Kidney:  Length: 10.1 cm. No hydronephrosis. Cortical echotexture within normal limits. No left renal mass.  Bladder:  Decompressed, Foley catheter balloon visible.  IMPRESSION: No hydronephrosis or acute renal findings.   Electronically Signed   By: Genevie Ann M.D.   On: 08/21/2014 16:13   Dg Chest Port 1 View  08/18/2014   CLINICAL DATA:  Shortness of breath today with exertion.  EXAM: PORTABLE CHEST - 1 VIEW  COMPARISON:  03/31/2014  FINDINGS: Heart is borderline in size. No confluent airspace opacities, effusions or edema. No acute bony abnormality.  IMPRESSION: No active disease.   Electronically Signed   By: Rolm Baptise M.D.   On: 08/18/2014 11:39      CBC  Recent Labs Lab 08/18/14 1115 08/19/14 0225 08/20/14 0333 08/21/14 0551 08/22/14 0420 08/23/14 0348  WBC 12.8* 14.0* 12.6* 12.9* 11.3* 10.1  HGB 10.4* 10.3* 10.2* 10.0* 9.8* 10.2*  HCT 33.6* 33.9* 34.8* 34.1* 33.2* 33.9*  PLT 234 256 251 217 235 221  MCV 89.6 89.2 91.6 91.9 91.0 90.4  MCH 27.7 27.1 26.8 27.0 26.8 27.2  MCHC 31.0 30.4 29.3* 29.3* 29.5* 30.1  RDW 14.8 14.8 14.8 14.7 14.9 15.0  LYMPHSABS 1.0  --   --   --   --   --   MONOABS 0.7  --   --   --   --   --   EOSABS 0.0  --   --   --   --   --   BASOSABS 0.0  --   --   --   --   --     Chemistries   Recent Labs Lab 08/18/14 1115 08/18/14 1411  08/20/14 0333 08/21/14 0551 08/21/14 1239 08/22/14 0420 08/23/14 0348  NA 138  --   < > 139 138 137 136 141  K 3.9  --   < > 3.5 4.8 5.0 4.9 5.2*  CL 96*  --   < > 96* 99* 96* 95* 98*  CO2 31  --   < > 34* 31 33* 31 36*  GLUCOSE 250*  --   < > 172* 201* 225* 248* 202*  BUN 11  --   < > 22* 31* 28* 32* 34*  CREATININE 0.89  --   < > 1.56* 1.39* 1.21* 1.30* 1.29*  CALCIUM 9.2  --   < > 8.5* 8.5* 9.0 8.8* 9.2  MG  --  1.4*  --  2.2  --   --   --  2.0  AST 21  --   --  18 19  --  18 20  ALT 11*  --   --  10* 11*  --  12* 13*  ALKPHOS 83  --   --  82 72  --  72 76  BILITOT 1.0  --   --  0.4 0.4  --  0.2* 0.4  < > = values in this interval not displayed. ------------------------------------------------------------------------------------------------------------------ estimated creatinine clearance is 40.9 mL/min (by C-G formula based on Cr of 1.29). ------------------------------------------------------------------------------------------------------------------ No results for input(s): HGBA1C in the last 72 hours. ------------------------------------------------------------------------------------------------------------------ No results for input(s): CHOL, HDL, LDLCALC, TRIG, CHOLHDL, LDLDIRECT in the last 72  hours. ------------------------------------------------------------------------------------------------------------------ No results for input(s): TSH, T4TOTAL, T3FREE, THYROIDAB in the last 72 hours.  Invalid input(s): FREET3 ------------------------------------------------------------------------------------------------------------------ No results for input(s): VITAMINB12, FOLATE, FERRITIN, TIBC, IRON, RETICCTPCT in the last 72 hours.  Coagulation profile No results for input(s): INR, PROTIME in the last 168 hours.  No results for input(s): DDIMER in the last 72 hours.  Cardiac Enzymes No results for input(s): CKMB, TROPONINI, MYOGLOBIN in the last 168 hours.  Invalid input(s): CK ------------------------------------------------------------------------------------------------------------------ Invalid input(s): POCBNP   CBG:  Recent Labs Lab 08/22/14 1151 08/22/14 1642 08/22/14 2156 08/23/14 0037 08/23/14 0746  GLUCAP 208* 140* 115* 233* 176*       Studies: Mr Brain Wo Contrast  08/21/2014   CLINICAL DATA:  Mental status changes. Rapid atrial fibrillation. Confusion. Memory loss.  EXAM: MRI HEAD WITHOUT CONTRAST  TECHNIQUE: Multiplanar, multiecho pulse sequences of the brain and surrounding structures were obtained without intravenous contrast.  COMPARISON:  None.  FINDINGS: The diffusion-weighted images demonstrate no evidence for acute or subacute infarction.  The study is moderately degraded by patient motion. Moderate atrophy and periventricular white matter change bilaterally is somewhat advanced for age. No acute infarct, hemorrhage, or mass lesion is present. The ventricles are of normal size. No significant extra-axial fluid collections are present.  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 White matter changes extend into the brainstem.  Flow is present in the major intracranial arteries. Bilateral lens replacements are present. Circumferential  mucosal thickening is present in  the posterior sphenoid sinus bilaterally. The remaining paranasal sinuses and the mastoid air cells are clear.  Midline structures are within normal limits.  IMPRESSION: 1. No acute intracranial abnormality. 2. Age advanced atrophy and white matter disease. This likely reflects the sequela of chronic microvascular ischemia.   Electronically Signed   By: San Morelle M.D.   On: 08/21/2014 14:28   US Renal  08/21/2014   CLINICAL DATA:  81 year old female with acute renal injury. Initial encounter.  EXAM: RENAL / URINARY TRACT ULTRASOUND COMPLETE  COMPARISON:  Lumbar spine MRI 01/11/2013.  Chest CTA 02/01/2014.  FINDINGS: Right Kidney:  Length: 10.6 cm. No hydronephrosis. Cortical echogenicity within normal limits. No right renal mass.  Left Kidney:  Length: 10.1 cm. No hydronephrosis. Cortical echotexture within normal limits. No left renal mass.  Bladder:  Decompressed, Foley catheter balloon visible.  IMPRESSION: No hydronephrosis or acute renal findings.   Electronically Signed   By: Genevie Ann M.D.   On: 08/21/2014 16:13      Lab Results  Component Value Date   HGBA1C 8.6* 08/20/2014   HGBA1C 7.5* 02/02/2014   HGBA1C 7.0* 01/11/2014   Lab Results  Component Value Date   CREATININE 1.29* 08/23/2014       Scheduled Meds: . bisoprolol  10 mg Oral BID  . budesonide-formoterol  2 puff Inhalation BID  . calcium citrate  200 mg of elemental calcium Oral Daily  . cefTRIAXone (ROCEPHIN)  IV  1 g Intravenous Q24H  . cholecalciferol  1,000 Units Oral Daily  . famotidine  20 mg Oral Daily  . gabapentin  600 mg Oral BID  . glimepiride  2 mg Oral BID  . insulin aspart  0-20 Units Subcutaneous TID WC  . insulin aspart  0-5 Units Subcutaneous QHS  . losartan  25 mg Oral Daily  . mupirocin ointment   Nasal BID  . pantoprazole  40 mg Oral QAC breakfast  . rivaroxaban  15 mg Oral Q supper  . rosuvastatin  5 mg Oral QHS  . sodium chloride  3 mL Intravenous Q12H   . sodium chloride  3 mL Intravenous Q12H  . traZODone  50 mg Oral QHS   Continuous Infusions: . sodium chloride 20 mL/hr at 08/21/14 0800  . sodium chloride 250 mL (08/21/14 0800)  . sodium chloride    . diltiazem (CARDIZEM) infusion Stopped (08/20/14 0505)    Principal Problem:   Atrial fibrillation with RVR Active Problems:   Benign hypertensive heart disease without heart failure   Aortic stenosis, mild   Diabetic peripheral neuropathy associated with type 2 diabetes mellitus   Essential hypertension   COPD GOLD II    Acute encephalopathy    Time spent: 45 minutes   [facility] Pager [phone]. If 7PM-7AM, please contact night-coverage at [email or web address], password [facility] 08/23/2014, 11:46 AM  LOS: 5 days

## 2014-08-23 NOTE — Interval H&P Note (Signed)
History and Physical Interval Note:  08/23/2014 1:42 PM  [name] [name]  has presented today for surgery, with the diagnosis of AFIB  The various methods of treatment have been discussed with the patient and family. After consideration of risks, benefits and other options for treatment, the patient has consented to  Procedure(s): TRANSESOPHAGEAL ECHOCARDIOGRAM (TEE) (N/A) CARDIOVERSION (N/A) as a surgical intervention .  The patient's history has been reviewed, patient examined, no change in status, stable for surgery.  I have reviewed the patient's chart and labs.  Questions were answered to the patient's satisfaction.     [name]

## 2014-08-23 NOTE — CV Procedure (Signed)
    Electrical Cardioversion Procedure Note [name] [name] [id number] Jun 26, 1930  Procedure: Electrical Cardioversion Indications:  Atrial Fibrillation  Time Out: Verified patient identification, verified procedure,medications/allergies/relevent history reviewed, required imaging and test results available.  Performed  Procedure Details  The patient was NPO after midnight. Anesthesia was administered at the beside  by Dr. Ola Spurr with propofol.  Cardioversion was performed with synchronized biphasic defibrillation via AP pads with 120 joules.  1 attempt(s) were performed.  The patient converted to normal sinus rhythm. The patient tolerated the procedure well   IMPRESSION:  Successful cardioversion of atrial fibrillation However, occasional PAC's noted, brief bradycardia (HR 50) post conversion.  TEE: EF 45%, aortic stenosis moderate, no LA appendage thrombus  [name] [name] 08/23/2014, 2:29 PM

## 2014-08-23 NOTE — Anesthesia Preprocedure Evaluation (Addendum)
Anesthesia Evaluation  Patient identified by MRN, date of birth, ID band Patient awake    Reviewed: Allergy & Precautions, NPO status , Patient's Chart, lab work & pertinent test results  Airway Mallampati: III  TM Distance: <3 FB Neck ROM: Limited    Dental  (+) Teeth Intact, Chipped, Dental Advisory Given,    Pulmonary COPDformer smoker,  breath sounds clear to auscultation        Cardiovascular hypertension, Pt. on medications and Pt. on home beta blockers + Valvular Problems/Murmurs AS Rhythm:Regular Rate:Normal     Neuro/Psych negative neurological ROS     GI/Hepatic PUD, GERD-  ,  Endo/Other  diabetes, Type 2Morbid obesity  Renal/GU Renal disease     Musculoskeletal  (+) Arthritis -,   Abdominal   Peds  Hematology  (+) anemia ,   Anesthesia Other Findings   Reproductive/Obstetrics                           Anesthesia Physical Anesthesia Plan  ASA: III  Anesthesia Plan: MAC   Post-op Pain Management:    Induction: Intravenous  Airway Management Planned: Natural Airway and Nasal Cannula  Additional Equipment:   Intra-op Plan:   Post-operative Plan:   Informed Consent: I have reviewed the patients History and Physical, chart, labs and discussed the procedure including the risks, benefits and alternatives for the proposed anesthesia with the patient or authorized representative who has indicated his/her understanding and acceptance.     Plan Discussed with: CRNA  Anesthesia Plan Comments:         Anesthesia Quick Evaluation

## 2014-08-23 NOTE — Evaluation (Signed)
Occupational Therapy Evaluation Patient Details Name: [name] [name] MRN: [id number] DOB: 1930-08-09 Today's Date: 08/23/2014    History of Present Illness 81 y.o. female admitted to [facility] on 08/18/14 for hypoxia and acute mental status changes.  Pt dx with A-fib with RVR, acute encephalopathy, acute on chronic diastolic heart failure, acute kidney injury, (+) UTI.  Pt with significant PMHx of chronic back pain, peripheral neuropathy, HTN, DM, and L TKA.   Clinical Impression   Pt admitted with above. Pt independent with ADLs, PTA. Feel pt will benefit from acute OT to increase independence prior to d/c. Recommending SNF for rehab.    Follow Up Recommendations  SNF    Equipment Recommendations  Other (comment) (defer to next venue)    Recommendations for Other Services       Precautions / Restrictions Precautions Precautions: Fall;Other (comment) Precaution Comments: monitor HR during session Restrictions Weight Bearing Restrictions: No      Mobility Bed Mobility Overal bed mobility: Needs Assistance Bed Mobility: Supine to Sit;Sit to Supine     Supine to sit: Max assist Sit to supine: Mod assist   General bed mobility comments: assist with trunk and LEs get to EOB. +2 assist to scoot HOB.  Transfers Overall transfer level: Needs assistance Equipment used: Rolling walker (2 wheeled) Transfers: Sit to/from Stand Sit to Stand: Min assist         General transfer comment: assist to boost.    Balance Overall balance assessment: Needs assistance        Min assist for balance when pt was sitting EOB-posterior lean                                  ADL Overall ADL's : Needs assistance/impaired     Grooming: Wash/dry face;Wash/dry hands;Applying deodorant;Sitting;Minimal assistance Grooming Details (indicate cue type and reason): assist for balance             Lower Body Dressing: Total assistance;Sit to/from stand   Toilet Transfer:  RW;Minimal assistance (sit to stand from bed )   Toileting- Water quality scientist and Hygiene: Sit to/from stand;Maximal assistance;Total assistance         General ADL Comments: OT wiped pt's bottom and pt stated she could not reach down to feet to don/doff socks and reports she can't cross legs.     Vision     Perception     Praxis      Pertinent Vitals/Pain Pain Assessment: No/denies pain; HR up to around 137 in session-AFIB. Nurse notified.     Hand Dominance Right   Extremity/Trunk Assessment Upper Extremity Assessment Upper Extremity Assessment: Generalized weakness   Lower Extremity Assessment Lower Extremity Assessment: Defer to PT evaluation       Communication Communication Communication: No difficulties   Cognition Arousal/Alertness: Awake/alert Behavior During Therapy: WFL for tasks assessed/performed Overall Cognitive Status:  (unsure of baseline) Area of Impairment: Orientation;Problem solving;Memory Orientation Level: Disoriented to;Place   Memory: Decreased short-term memory       Problem Solving: Slow processing     General Comments       Exercises       Shoulder Instructions      Home Living Family/patient expects to be discharged to:: Private residence Living Arrangements: Spouse/significant other Available Help at Discharge: Family (husband has dementia) Type of Home: House Home Access: Stairs to enter [employer] of Steps: 2 Entrance Stairs-Rails: Right Home Layout: Two level;Able to  live on main level with bedroom/bathroom               Home Equipment: Kasandra Knudsen - single point;Walker - 2 wheels;Shower seat          Prior Functioning/Environment Level of Independence: Independent with assistive device(s)        Comments: uses cane for ambulation.    OT Diagnosis: Generalized weakness;Altered mental status   OT Problem List: Decreased strength;Decreased range of motion;Decreased activity  tolerance;Decreased knowledge of use of DME or AE;Decreased knowledge of precautions;Decreased cognition   OT Treatment/Interventions: Self-care/ADL training;Therapeutic activities;Cognitive remediation/compensation;Patient/family education;Balance training;Therapeutic exercise;DME and/or AE instruction;Energy conservation    OT Goals(Current goals can be found in the care plan section) Acute Rehab OT Goals Patient Stated Goal: to knit OT Goal Formulation: With patient Time For Goal Achievement: 08/30/14 Potential to Achieve Goals: Good ADL Goals Pt Will Perform Lower Body Bathing: sit to/from stand;with min assist (with or without AE) Pt Will Perform Lower Body Dressing: with min assist;sit to/from stand (with or without AE) Pt Will Transfer to Toilet: ambulating;bedside commode;with supervision Pt Will Perform Toileting - Clothing Manipulation and hygiene: sit to/from stand;with min guard assist Additional ADL Goal #1: Pt will perform bed mobility at min assist level as precursor for ADLs.  OT Frequency: Min 2X/week   Barriers to D/C:            Co-evaluation              End of Session Equipment Utilized During Treatment: Gait belt;Rolling walker Nurse Communication: Other (comment) (HR)  Activity Tolerance: Other (comment) (increased HR) Patient left: in bed;with call bell/phone within reach;with bed alarm set   Time: 1216-1233 OT Time Calculation (min): 17 min Charges:  OT General Charges $OT Visit: 1 Procedure OT Evaluation $Initial OT Evaluation Tier I: 1 Procedure G-CodesBenito Mccreedy OTR/L [id number] 08/23/2014, 12:57 PM

## 2014-08-24 ENCOUNTER — Emergency Department (HOSPITAL_COMMUNITY)
Admission: EM | Admit: 2014-08-24 | Discharge: 2014-08-25 | Disposition: A | Discharge status: Home / Self Care | Payer: Medicare HMO | Attending: Emergency Medicine | Admitting: Emergency Medicine

## 2014-08-24 ENCOUNTER — Encounter (HOSPITAL_COMMUNITY): Payer: Self-pay | Admitting: Cardiology

## 2014-08-24 ENCOUNTER — Emergency Department (HOSPITAL_COMMUNITY): Payer: Medicare HMO

## 2014-08-24 ENCOUNTER — Ambulatory Visit (HOSPITAL_COMMUNITY): Payer: Medicare HMO

## 2014-08-24 DIAGNOSIS — E669 Obesity, unspecified: Secondary | ICD-10-CM | POA: Insufficient documentation

## 2014-08-24 DIAGNOSIS — R011 Cardiac murmur, unspecified: Secondary | ICD-10-CM | POA: Insufficient documentation

## 2014-08-24 DIAGNOSIS — R410 Disorientation, unspecified: Secondary | ICD-10-CM

## 2014-08-24 DIAGNOSIS — I10 Essential (primary) hypertension: Secondary | ICD-10-CM | POA: Insufficient documentation

## 2014-08-24 DIAGNOSIS — R001 Bradycardia, unspecified: Secondary | ICD-10-CM | POA: Diagnosis not present

## 2014-08-24 DIAGNOSIS — Z8701 Personal history of pneumonia (recurrent): Secondary | ICD-10-CM | POA: Insufficient documentation

## 2014-08-24 DIAGNOSIS — Z85828 Personal history of other malignant neoplasm of skin: Secondary | ICD-10-CM | POA: Insufficient documentation

## 2014-08-24 DIAGNOSIS — Z87891 Personal history of nicotine dependence: Secondary | ICD-10-CM | POA: Insufficient documentation

## 2014-08-24 DIAGNOSIS — Z79899 Other long term (current) drug therapy: Secondary | ICD-10-CM | POA: Insufficient documentation

## 2014-08-24 DIAGNOSIS — E119 Type 2 diabetes mellitus without complications: Secondary | ICD-10-CM | POA: Insufficient documentation

## 2014-08-24 DIAGNOSIS — G8929 Other chronic pain: Secondary | ICD-10-CM | POA: Insufficient documentation

## 2014-08-24 DIAGNOSIS — K219 Gastro-esophageal reflux disease without esophagitis: Secondary | ICD-10-CM | POA: Insufficient documentation

## 2014-08-24 LAB — COMPREHENSIVE METABOLIC PANEL
ALBUMIN: 3.7 g/dL (ref 3.5–5.0)
ALT: 13 U/L — ABNORMAL LOW (ref 14–54)
AST: 23 U/L (ref 15–41)
Alkaline Phosphatase: 84 U/L (ref 38–126)
Anion gap: 11 (ref 5–15)
BUN: 43 mg/dL — ABNORMAL HIGH (ref 6–20)
CHLORIDE: 95 mmol/L — AB (ref 101–111)
CO2: 32 mmol/L (ref 22–32)
CREATININE: 1.39 mg/dL — AB (ref 0.44–1.00)
Calcium: 9 mg/dL (ref 8.9–10.3)
GFR calc Af Amer: 40 mL/min — ABNORMAL LOW (ref >60)
GFR, EST NON AFRICAN AMERICAN: 34 mL/min — AB (ref >60)
Glucose, Bld: 173 mg/dL — ABNORMAL HIGH (ref 65–99)
POTASSIUM: 4.5 mmol/L (ref 3.5–5.1)
Sodium: 138 mmol/L (ref 135–145)
Total Bilirubin: 0.4 mg/dL (ref 0.3–1.2)
Total Protein: 7.1 g/dL (ref 6.5–8.1)

## 2014-08-24 LAB — I-STAT CHEM 8, ED
BUN: 49 mg/dL — ABNORMAL HIGH (ref 6–20)
CALCIUM ION: 1.06 mmol/L — AB (ref 1.13–1.30)
CHLORIDE: 97 mmol/L — AB (ref 101–111)
Creatinine, Ser: 1.4 mg/dL — ABNORMAL HIGH (ref 0.44–1.00)
Glucose, Bld: 170 mg/dL — ABNORMAL HIGH (ref 65–99)
HEMATOCRIT: 37 % (ref 36.0–46.0)
Hemoglobin: 12.6 g/dL (ref 12.0–15.0)
POTASSIUM: 4.5 mmol/L (ref 3.5–5.1)
SODIUM: 139 mmol/L (ref 135–145)
TCO2: 30 mmol/L (ref 0–100)

## 2014-08-24 LAB — URINE MICROSCOPIC-ADD ON

## 2014-08-24 LAB — CBC WITH DIFFERENTIAL/PLATELET
Basophils Absolute: 0 10*3/uL (ref 0.0–0.1)
Basophils Relative: 0 % (ref 0–1)
Eosinophils Absolute: 0.2 10*3/uL (ref 0.0–0.7)
Eosinophils Relative: 2 % (ref 0–5)
HCT: 35.5 % — ABNORMAL LOW (ref 36.0–46.0)
Hemoglobin: 10.6 g/dL — ABNORMAL LOW (ref 12.0–15.0)
LYMPHS PCT: 19 % (ref 12–46)
Lymphs Abs: 1.9 10*3/uL (ref 0.7–4.0)
MCH: 27.5 pg (ref 26.0–34.0)
MCHC: 29.9 g/dL — ABNORMAL LOW (ref 30.0–36.0)
MCV: 92 fL (ref 78.0–100.0)
MONO ABS: 1.2 10*3/uL — AB (ref 0.1–1.0)
MONOS PCT: 12 % (ref 3–12)
Neutro Abs: 6.7 10*3/uL (ref 1.7–7.7)
Neutrophils Relative %: 67 % (ref 43–77)
Platelets: 208 10*3/uL (ref 150–400)
RBC: 3.86 MIL/uL — AB (ref 3.87–5.11)
RDW: 15.1 % (ref 11.5–15.5)
WBC: 10 10*3/uL (ref 4.0–10.5)

## 2014-08-24 LAB — GLUCOSE, CAPILLARY
Glucose-Capillary: 146 mg/dL — ABNORMAL HIGH (ref 65–99)
Glucose-Capillary: 205 mg/dL — ABNORMAL HIGH (ref 65–99)

## 2014-08-24 LAB — URINALYSIS, ROUTINE W REFLEX MICROSCOPIC
Bilirubin Urine: NEGATIVE
GLUCOSE, UA: NEGATIVE mg/dL
Hgb urine dipstick: NEGATIVE
Ketones, ur: NEGATIVE mg/dL
Nitrite: NEGATIVE
PH: 5 (ref 5.0–8.0)
PROTEIN: 30 mg/dL — AB
SPECIFIC GRAVITY, URINE: 1.014 (ref 1.005–1.030)
Urobilinogen, UA: 0.2 mg/dL (ref 0.0–1.0)

## 2014-08-24 LAB — I-STAT CG4 LACTIC ACID, ED: Lactic Acid, Venous: 1.51 mmol/L (ref 0.5–2.0)

## 2014-08-24 LAB — CBG MONITORING, ED: GLUCOSE-CAPILLARY: 119 mg/dL — AB (ref 65–99)

## 2014-08-24 MED ORDER — BISOPROLOL FUMARATE 5 MG PO TABS: 7.5000 mg | ORAL_TABLET | Freq: Every day | ORAL | Status: DC

## 2014-08-24 MED ORDER — ENSURE ENLIVE PO LIQD: 237.0000 mL | Freq: Two times a day (BID) | ORAL | Status: DC

## 2014-08-24 MED ORDER — CYANOCOBALAMIN 1000 MCG/ML IJ SOLN: 1000.0000 ug | INTRAMUSCULAR | Status: DC

## 2014-08-24 MED ORDER — BISOPROLOL FUMARATE 10 MG PO TABS: 10.0000 mg | ORAL_TABLET | Freq: Every day | ORAL | Status: DC

## 2014-08-24 MED ORDER — SODIUM CHLORIDE 0.9 % IV BOLUS (SEPSIS)
250.0000 mL | Freq: Once | INTRAVENOUS | Status: AC
  Administered 2014-08-24: 250 mL via INTRAVENOUS

## 2014-08-24 MED ORDER — FOLIC ACID 1 MG PO TABS: 1.0000 mg | ORAL_TABLET | Freq: Every day | ORAL | Status: DC

## 2014-08-24 MED ORDER — BISOPROLOL FUMARATE 5 MG PO TABS
10.0000 mg | ORAL_TABLET | Freq: Every day | ORAL | Status: DC
  Filled 2014-08-24: qty 2

## 2014-08-24 MED ORDER — RIVAROXABAN 20 MG PO TABS: 20.0000 mg | ORAL_TABLET | Freq: Every day | ORAL | Status: DC

## 2014-08-24 MED ORDER — FUROSEMIDE 20 MG PO TABS: 20.0000 mg | ORAL_TABLET | Freq: Every day | ORAL | Status: DC

## 2014-08-24 MED ORDER — ACETAMINOPHEN 325 MG PO TABS: 650.0000 mg | ORAL_TABLET | Freq: Four times a day (QID) | ORAL | Status: DC | PRN

## 2014-08-24 NOTE — Discharge Instructions (Signed)
Bradycardia °Bradycardia is a term for a heart rate (pulse) that, in adults, is slower than 60 beats per minute. A normal rate is 60 to 100 beats per minute. A heart rate below 60 beats per minute may be normal for some adults with healthy hearts. If the rate is too slow, the heart may have trouble pumping the volume of blood the body needs. If the heart rate gets too low, blood flow to the brain may be decreased and may make you feel lightheaded, dizzy, or faint. °The heart has a natural pacemaker in the top of the heart called the SA node (sinoatrial or sinus node). This pacemaker sends out regular electrical signals to the muscle of the heart, telling the heart muscle when to beat (contract). The electrical signal travels from the upper parts of the heart (atria) through the AV node (atrioventricular node), to the lower chambers of the heart (ventricles). The ventricles squeeze, pumping the blood from your heart to your lungs and to the rest of your body. °CAUSES  °· Problem with the heart's electrical system. °· Problem with the heart's natural pacemaker. °· Heart disease, damage, or infection. °· Medications. °· Problems with minerals and salts (electrolytes). °SYMPTOMS  °· Fainting (syncope). °· Fatigue and weakness. °· Shortness of breath (dyspnea). °· Chest pain (angina). °· Drowsiness. °· Confusion. °DIAGNOSIS  °· An electrocardiogram (ECG) can help your caregiver determine the type of slow heart rate you have. °· If the cause is not seen on an ECG, you may need to wear a heart monitor that records your heart rhythm for several hours or days. °· Blood tests. °TREATMENT  °· Electrolyte supplements. °· Medications. °· Withholding medication which is causing a slow heart rate. °· Pacemaker placement. °SEEK IMMEDIATE MEDICAL CARE IF:  °· You feel lightheaded or faint. °· You develop an irregular heart rate. °· You feel chest pain or have trouble breathing. °MAKE SURE YOU:  °· Understand these  instructions. °· Will watch your condition. °· Will get help right away if you are not doing well or get worse. °Document Released: 09/21/2001 Document Revised: 03/24/2011 Document Reviewed: 04/06/2013 °[employer]® Patient Information ©2015 [employer]. This information is not intended to replace advice given to you by your health care provider. Make sure you discuss any questions you have with your health care provider. ° °

## 2014-08-24 NOTE — Discharge Summary (Addendum)
[name] [name], is a 81 y.o. female  DOB 03-13-1930  MRN [id number].  Admission date:  08/18/2014  Admitting Physician  Thayer Headings, MD  Discharge Date:  08/24/2014   Primary MD  Cari Caraway, MD  Recommendations for primary care physician for things to follow:  - To be seen by SNF physician in 48 hours - Check CBC, BMP in 3 days   Admission Diagnosis  Respiratory distress [R06.00] Atrial fibrillation with RVR [I48.91] Congestive heart failure, unspecified congestive heart failure chronicity, unspecified congestive heart failure type [I50.9]   Discharge Diagnosis  Respiratory distress [R06.00] Atrial fibrillation with RVR [I48.91] Congestive heart failure, unspecified congestive heart failure chronicity, unspecified congestive heart failure type [I50.9]    Principal Problem:   Atrial fibrillation with RVR Active Problems:   Benign hypertensive heart disease without heart failure   Aortic stenosis, mild   Diabetic peripheral neuropathy associated with type 2 diabetes mellitus   Essential hypertension   COPD GOLD II    Acute encephalopathy      Past Medical History  Diagnosis Date  . Mild aortic stenosis     a. 10/2010 Echo: Ef 60-65%, no rwma, mild LVH no AS.  Marland Kitchen Chronic back pain   . Peripheral neuropathy   . GERD (gastroesophageal reflux disease)   . Hypertension   . History of pneumonia   . History of bronchitis   . Basal cell carcinoma     a. 2013.  . Non-cardiac chest pain     LHC w/ nl coronaries and nl EF 60% -12/2013  . PAC (premature atrial contraction)   . Heart murmur   . Type II diabetes mellitus     TYPE 2    Past Surgical History  Procedure Laterality Date  . Vesicovaginal fistula closure w/ tah  1964  . US echocardiography  04-26-09    EF 55-60%  . Cardiovascular stress test  05-13-2005    EF 67%  . Knee surgery    . Joint replacement  2000    left knee  .  Abdominal hysterectomy  1964  . Toe shortened      right 2nd toe  . Cataract extraction      both eyes  . Mass excision  01/13/2012    Procedure: EXCISION MASS;  Surgeon: Ralene Ok, MD;  Location: [facility];  Service: General;  Laterality: Right;  Excision of Right Back Mass  . Left heart catheterization with coronary angiogram N/A 01/03/2014    Procedure: LEFT HEART CATHETERIZATION WITH CORONARY ANGIOGRAM;  Surgeon: Lorretta Harp, MD;  Location: [facility] CATH LAB;  Service: Cardiovascular;  Laterality: N/A;  . Cardiac catheterization    . Eye surgery      cataract  . Tee without cardioversion N/A 08/23/2014    Procedure: TRANSESOPHAGEAL ECHOCARDIOGRAM (TEE);  Surgeon: Jerline Pain, MD;  Location: [facility];  Service: Cardiovascular;  Laterality: N/A;  . Cardioversion N/A 08/23/2014    Procedure: CARDIOVERSION;  Surgeon: Jerline Pain, MD;  Location: [facility];  Service:  Cardiovascular;  Laterality: N/A;       History of present illness and  Hospital Course:     Kindly see H&P for history of present illness and admission details, please review complete Labs, Consult reports and Test reports for all details in brief  HPI  from the history and physical done on the day of admission  81 year old obese female with history of hypertension, OPD cholestasis 2, peripheral neuropathy, diastolic CHF, diabetes mellitus type 2, mild aortic stenosis, cardiac cath done in December 2015 with normal coronaries and EF of 60%, premature atrial contractions who lives at home with her husband was brought to the ED by EMS as patient reportedly was confused for the last few days and also had not taken her medications since yesterday. EMS found her to be hypoxic (no documentation of her O2 sat) and when brought to the ED she was satting at 91% on room air and improved to 99% on 2 L nasal cannula. Patient was alert and oriented during my evaluation and says she does not remember some of the events yesterday.  She reports that normally she is compliant with her medications. At baseline she is able to ambulate in and around the house without any difficulty. She denies any recent illness or change in her medications. Patient denies headache, dizziness, fever, chills, nausea , vomiting, chest pain, palpitations, SOB, abdominal pain, bowel or urinary symptoms. Denies change in weight or appetite.   Hospital Course   Atrial fibrillation with RVR - Initially on IV Cardizem, then transitioned to beta blockers - Superolateral increased on discharge to 10 mg oral daily as per cardiology recommendation -Her CHADS 2 vasc score is 5, currently on anticoagulation with Xarelto  - Status post TEE, DCCV  on 8/10. Back to NSR.   Acute kidney injury, likely prerenal Renal ultrasound negative for hydronephrosis or obstructive uropathy, most recent creatinine is 1.23 on 8/10   Leukocytosis , resolved-urine culture positive for Klebsiella pneumonia> 100,000 colonies, treated with IV Rocephin total of 5 days   Mild acute on chronic diastolic CHF Discontinued IV Lasix 40 mg twice daily given jump in her creatinine. Monitor strict I/O and daily weight. We'll discharge on Lasix 20 mg oral daily. Continue aspirin, beta blocker, ARB and statin    ? Acute encephalopathy, resolved No clear etiology. . Mental status appears at baseline at this time. ABG done 8/5 showed hypercapnia . TSH normal, severe vitamin B-12 deficiency [125] will start vitamin B-12 injections weekly . Will monitor for now. Will need neuropsychiatry evaluation in the outpatient setting for dementia . Discussed with daughter  Vitamin B-12 deficiency - started on N-05 and folic acid supplement   Benign hypertensive heart disease without heart failure Blood pressure stable. Continue losartan (dose reduced), bisoprolol,   Aortic stenosis, mild Repeat echo with results as above   Diabetic peripheral neuropathy associated with type 2  diabetes mellitus Continue Neurontin.   Essential hypertension Continue home medications as above   COPD GOLD II  No acute symptoms. Will resume home inhalers. Place on when necessary nebs,. Follows with Dr. Melvyn Novas as outpatient   Diabetes mellitus Hemoglobin A1c 8.6, resume metformin on discharge     Discharge Condition:  Stable  Follow UP  Follow-up Information    Follow up with [facility], [place].   Specialty:  Family Medicine   Why:  POST DISCHRGE FROM SNF.   Contact information:   [place] [address] [phone]       Follow  up with Warren Danes, MD. Call in 3 weeks.   Specialty:  Cardiology   Why:  Posthospitalization follow-up   Contact information:   [address] [address] [place] [address] [phone]         Discharge Instructions  and  Discharge Medications         Discharge Instructions    Discharge instructions    Complete by:  As directed   Follow with Primary MD MCNEILL,WENDY, MD after discharge from SNF  Get CBC, CMP, 2 view Chest X ray checked  by Primary MD next visit.    Activity: As tolerated with Full fall precautions use walker/cane & assistance as needed   Disposition SNF   Diet: Heart Healthy , carbohydrate modified, with feeding assistance and aspiration precautions.  For Heart failure patients - Check your Weight same time everyday, if you gain over 2 pounds, or you develop in leg swelling, experience more shortness of breath or chest pain, call your Primary MD immediately. Follow Cardiac Low Salt Diet and 1.5 lit/day fluid restriction.   On your next visit with your primary care physician please Get Medicines reviewed and adjusted.   Please request your Prim.MD to go over all Hospital Tests and Procedure/Radiological results at the follow up, please get all Hospital records sent to your Prim MD by signing hospital release before you go home.   If you experience worsening of your  admission symptoms, develop shortness of breath, life threatening emergency, suicidal or homicidal thoughts you must seek medical attention immediately by calling 911 or calling your MD immediately  if symptoms less severe.  You Must read complete instructions/literature along with all the possible adverse reactions/side effects for all the Medicines you take and that have been prescribed to you. Take any new Medicines after you have completely understood and accpet all the possible adverse reactions/side effects.   Do not drive, operating heavy machinery, perform activities at heights, swimming or participation in water activities or provide baby sitting services if your were admitted for syncope or siezures until you have seen by Primary MD or a Neurologist and advised to do so again.  Do not drive when taking Pain medications.    Do not take more than prescribed Pain, Sleep and Anxiety Medications  Special Instructions: If you have smoked or chewed Tobacco  in the last 2 yrs please stop smoking, stop any regular Alcohol  and or any Recreational drug use.  Wear Seat belts while driving.   Please note  You were cared for by a hospitalist during your hospital stay. If you have any questions about your discharge medications or the care you received while you were in the hospital after you are discharged, you can call the unit and asked to speak with the hospitalist on call if the hospitalist that took care of you is not available. Once you are discharged, your primary care physician will handle any further medical issues. Please note that NO REFILLS for any discharge medications will be authorized once you are discharged, as it is imperative that you return to your primary care physician (or establish a relationship with a primary care physician if you do not have one) for your aftercare needs so that they can reassess your need for medications and monitor your lab values.     Increase activity  slowly    Complete by:  As directed             Medication List  STOP taking these medications        aspirin 81 MG tablet     traMADol 50 MG tablet  Commonly known as:  ULTRAM      TAKE these medications        acetaminophen 325 MG tablet  Commonly known as:  TYLENOL  Take 2 tablets (650 mg total) by mouth every 6 (six) hours as needed for mild pain (or Fever >/= 101).     AEROCHAMBER MV inhaler  Use as instructed     albuterol 108 (90 BASE) MCG/ACT inhaler  Commonly known as:  PROVENTIL HFA;VENTOLIN HFA  Inhale 1 puff into the lungs every 6 (six) hours as needed for wheezing or shortness of breath.     BIOTIN PO  Take 1 tablet by mouth daily.     bisoprolol 10 MG tablet  Commonly known as:  ZEBETA  Take 1 tablet (10 mg total) by mouth daily.     budesonide-formoterol 80-4.5 MCG/ACT inhaler  Commonly known as:  SYMBICORT  Inhale 2 puffs into the lungs 2 (two) times daily.     calcium citrate 950 MG tablet  Commonly known as:  CALCITRATE - dosed in mg elemental calcium  Take 200 mg of elemental calcium by mouth daily.     cholecalciferol 1000 UNITS tablet  Commonly known as:  VITAMIN D  Take 1,000 Units by mouth daily.     cyanocobalamin 1000 MCG/ML injection  Commonly known as:  (VITAMIN B-12)  Inject 1 mL (1,000 mcg total) into the muscle once a week.     famotidine 20 MG tablet  Commonly known as:  PEPCID  Take 20 mg by mouth daily.     feeding supplement (ENSURE ENLIVE) Liqd  Take 237 mLs by mouth 2 (two) times daily between meals.     folic acid 1 MG tablet  Commonly known as:  FOLVITE  Take 1 tablet (1 mg total) by mouth daily.     furosemide 20 MG tablet  Commonly known as:  LASIX  Take 1 tablet (20 mg total) by mouth daily.     gabapentin 300 MG capsule  Commonly known as:  NEURONTIN  Take 2 capsules (600 mg total) by mouth 2 (two) times daily.     glimepiride 2 MG tablet  Commonly known as:  AMARYL  Take 2 mg by mouth daily with  breakfast.     ipratropium-albuterol 0.5-2.5 (3) MG/3ML Soln  Commonly known as:  DUONEB  Take 3 mLs by nebulization every 6 (six) hours as needed.     losartan 50 MG tablet  Commonly known as:  COZAAR  Take 50 mg by mouth 2 (two) times daily. 1/2 tablet by mouth daily     metFORMIN 1000 MG tablet  Commonly known as:  GLUCOPHAGE  Take 1 tablet (1,000 mg total) by mouth 2 (two) times daily with a meal.     nitroGLYCERIN 0.4 MG SL tablet  Commonly known as:  NITROSTAT  Place 1 tablet (0.4 mg total) under the tongue every 5 (five) minutes as needed for chest pain.     pantoprazole 40 MG tablet  Commonly known as:  PROTONIX  Take 40 mg by mouth every morning.     rivaroxaban 20 MG Tabs tablet  Commonly known as:  XARELTO  Take 1 tablet (20 mg total) by mouth daily with supper.     rosuvastatin 5 MG tablet  Commonly known as:  CRESTOR  Take 5 mg by mouth at bedtime.  Diet and Activity recommendation: See Discharge Instructions above   Consults obtained -  CARDIOLOGY  Major procedures and Radiology Reports - PLEASE review detailed and final reports for all details, in brief -   TEE and DCCV on 8/10   Mr Brain Wo Contrast  08/21/2014   CLINICAL DATA:  Mental status changes. Rapid atrial fibrillation. Confusion. Memory loss.  EXAM: MRI HEAD WITHOUT CONTRAST  TECHNIQUE: Multiplanar, multiecho pulse sequences of the brain and surrounding structures were obtained without intravenous contrast.  COMPARISON:  None.  FINDINGS: The diffusion-weighted images demonstrate no evidence for acute or subacute infarction.  The study is moderately degraded by patient motion. Moderate atrophy and periventricular white matter change bilaterally is somewhat advanced for age. No acute infarct, hemorrhage, or mass lesion is present. The ventricles are of normal size. No significant extra-axial fluid collections are present.  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 White  matter changes extend into the brainstem.  Flow is present in the major intracranial arteries. Bilateral lens replacements are present. Circumferential mucosal thickening is present in the posterior sphenoid sinus bilaterally. The remaining paranasal sinuses and the mastoid air cells are clear.  Midline structures are within normal limits.  IMPRESSION: 1. No acute intracranial abnormality. 2. Age advanced atrophy and white matter disease. This likely reflects the sequela of chronic microvascular ischemia.   Electronically Signed   By: San Morelle M.D.   On: 08/21/2014 14:28   US Renal  08/21/2014   CLINICAL DATA:  81 year old female with acute renal injury. Initial encounter.  EXAM: RENAL / URINARY TRACT ULTRASOUND COMPLETE  COMPARISON:  Lumbar spine MRI 01/11/2013.  Chest CTA 02/01/2014.  FINDINGS: Right Kidney:  Length: 10.6 cm. No hydronephrosis. Cortical echogenicity within normal limits. No right renal mass.  Left Kidney:  Length: 10.1 cm. No hydronephrosis. Cortical echotexture within normal limits. No left renal mass.  Bladder:  Decompressed, Foley catheter balloon visible.  IMPRESSION: No hydronephrosis or acute renal findings.   Electronically Signed   By: Genevie Ann M.D.   On: 08/21/2014 16:13   Dg Chest Port 1 View  08/18/2014   CLINICAL DATA:  Shortness of breath today with exertion.  EXAM: PORTABLE CHEST - 1 VIEW  COMPARISON:  03/31/2014  FINDINGS: Heart is borderline in size. No confluent airspace opacities, effusions or edema. No acute bony abnormality.  IMPRESSION: No active disease.   Electronically Signed   By: Rolm Baptise M.D.   On: 08/18/2014 11:39    Micro Results     Recent Results (from the past 240 hour(s))  Urine culture     Status: None   Collection Time: 08/18/14 10:55 AM  Result Value Ref Range Status   Specimen Description URINE, CLEAN CATCH  Final   Special Requests NONE  Final   Culture >=100,000 COLONIES/mL KLEBSIELLA PNEUMONIAE  Final   Report Status  08/20/2014 FINAL  Final   Organism ID, Bacteria KLEBSIELLA PNEUMONIAE  Final      Susceptibility   Klebsiella pneumoniae - MIC*    AMPICILLIN 16 RESISTANT Resistant     CEFAZOLIN <=4 SENSITIVE Sensitive     CEFTRIAXONE <=1 SENSITIVE Sensitive     CIPROFLOXACIN <=0.25 SENSITIVE Sensitive     GENTAMICIN <=1 SENSITIVE Sensitive     IMIPENEM <=0.25 SENSITIVE Sensitive     NITROFURANTOIN 32 SENSITIVE Sensitive     TRIMETH/SULFA >=320 RESISTANT Resistant     AMPICILLIN/SULBACTAM 4 SENSITIVE Sensitive     PIP/TAZO <=4 SENSITIVE Sensitive     * >=  100,000 COLONIES/mL KLEBSIELLA PNEUMONIAE  MRSA PCR Screening     Status: Abnormal   Collection Time: 08/18/14  5:52 PM  Result Value Ref Range Status   MRSA by PCR POSITIVE (A) NEGATIVE Final    Comment:        The GeneXpert MRSA Assay (FDA approved for NASAL specimens only), is one component of a comprehensive MRSA colonization surveillance program. It is not intended to diagnose MRSA infection nor to guide or monitor treatment for MRSA infections. RESULT CALLED TO, READ BACK BY AND VERIFIED WITH: Mathis Bud RN [id number] 08/18/14 A BROWNING        Today   Subjective:   [name] [name] today has no headache,no chest abdominal pain,no new weakness tingling or numbness,  Objective:   Blood pressure 136/55, pulse 54, temperature 97.7 F (36.5 C), temperature source Oral, resp. rate 26, height 5\' 7"  (1.702 m), weight 102.331 kg (225 lb 9.6 oz), SpO2 94 %.   Intake/Output Summary (Last 24 hours) at 08/24/14 1442 Last data filed at 08/23/14 1502  Gross per 24 hour  Intake    200 ml  Output      0 ml  Net    200 ml    Exam Awake Alert,  [place].AT,PERRAL Supple Neck,No JVD, No cervical lymphadenopathy appriciated.  Symmetrical Chest wall movement, Good air movement bilaterally,  RRR,No Gallops,Rubs or new Murmurs, No Parasternal Heave +ve B.Sounds, Abd Soft, Non tender,  No Cyanosis, Clubbing or edema, No new Rash or bruise  Data Review     CBC w Diff:  Lab Results  Component Value Date   WBC 10.1 08/23/2014   HGB 10.2* 08/23/2014   HCT 33.9* 08/23/2014   PLT 221 08/23/2014   LYMPHOPCT 8* 08/18/2014   MONOPCT 6 08/18/2014   EOSPCT 0 08/18/2014   BASOPCT 0 08/18/2014    CMP:  Lab Results  Component Value Date   NA 137 08/23/2014   K 4.9 08/23/2014   CL 96* 08/23/2014   CO2 31 08/23/2014   BUN 33* 08/23/2014   CREATININE 1.23* 08/23/2014   PROT 6.6 08/23/2014   ALBUMIN 3.4* 08/23/2014   BILITOT 0.4 08/23/2014   ALKPHOS 76 08/23/2014   AST 20 08/23/2014   ALT 13* 08/23/2014  .   Total Time in preparing paper work, data evaluation and todays exam - 35 minutes  Jessicamarie Amiri M.D on 08/24/2014 at 2:42 PM  [facility]   Office  [phone]

## 2014-08-24 NOTE — Progress Notes (Signed)
Pt foley removed. Per MD

## 2014-08-24 NOTE — ED Notes (Signed)
2 EKG's shot and handed to the MD

## 2014-08-24 NOTE — Progress Notes (Signed)
Notified by CCMD about pt's rhythm. Pt appeared to be in 2nd degree HB type 2 for a few seconds. Pt has been SB w/ PACs all night. Obtained an EKG which showed sinus bradycardia. Spoke with Dr. Radford Pax who advised that pt's morning dose of zebeta be held until the primary team rounds. VSS; will continue to monitor pt and notify MD if any further changes are noted.

## 2014-08-24 NOTE — Progress Notes (Signed)
Patient Name: [name] [name] Date of Encounter: 08/24/2014     Principal Problem:   Atrial fibrillation with RVR Active Problems:   Benign hypertensive heart disease without heart failure   Aortic stenosis, mild   Diabetic peripheral neuropathy associated with type 2 diabetes mellitus   Essential hypertension   COPD GOLD II    Acute encephalopathy    SUBJECTIVE  The patient is feeling well today after her successful TEE cardioversion yesterday.  She had asymptomatic sinus bradycardia during the night.  She denies any chest pain or cough or shortness of breath.  Her mental status appears to be improved  CURRENT MEDS . bisoprolol  10 mg Oral BID  . budesonide-formoterol  2 puff Inhalation BID  . calcium citrate  200 mg of elemental calcium Oral Daily  . cefTRIAXone (ROCEPHIN)  IV  1 g Intravenous Q24H  . cholecalciferol  1,000 Units Oral Daily  . famotidine  20 mg Oral Daily  . gabapentin  600 mg Oral BID  . glimepiride  2 mg Oral BID  . insulin aspart  0-20 Units Subcutaneous TID WC  . insulin aspart  0-5 Units Subcutaneous QHS  . losartan  25 mg Oral Daily  . mupirocin ointment   Nasal BID  . pantoprazole  40 mg Oral QAC breakfast  . rivaroxaban  20 mg Oral Q supper  . rosuvastatin  5 mg Oral QHS  . sodium chloride  3 mL Intravenous Q12H  . traZODone  50 mg Oral QHS    OBJECTIVE  Filed Vitals:   08/23/14 2052 08/23/14 2055 08/24/14 0051 08/24/14 0445  BP: 144/53  152/74 136/55  Pulse:   85 59  Temp:    97.7 F (36.5 C)  TempSrc:    Oral  Resp: 20  23 19   Height:      Weight:    225 lb 9.6 oz (102.331 kg)  SpO2:  97% 93% 94%    Intake/Output Summary (Last 24 hours) at 08/24/14 0912 Last data filed at 08/23/14 1502  Gross per 24 hour  Intake    400 ml  Output      0 ml  Net    400 ml   Filed Weights   08/23/14 0400 08/23/14 1333 08/24/14 0445  Weight: 221 lb (100.245 kg) 221 lb (100.245 kg) 225 lb 9.6 oz (102.331 kg)    PHYSICAL EXAM  General:  Pleasant, NAD. Neuro: Alert and oriented X 3. Moves all extremities spontaneously. Psych: Normal affect. HEENT:  Normal  Neck: Supple without bruits or JVD. Lungs:  Resp regular and unlabored, CTA. Heart: RRR no s3, s4, or murmurs. Abdomen: Soft, non-tender, non-distended, BS + x 4.  Extremities: No clubbing, cyanosis or edema. DP/PT/Radials 2+ and equal bilaterally.  Accessory Clinical Findings  CBC  Recent Labs  08/22/14 0420 08/23/14 0348  WBC 11.3* 10.1  HGB 9.8* 10.2*  HCT 33.2* 33.9*  MCV 91.0 90.4  PLT 235 010   Basic Metabolic Panel  Recent Labs  08/23/14 0348 08/23/14 1240  NA 141 137  K 5.2* 4.9  CL 98* 96*  CO2 36* 31  GLUCOSE 202* 201*  BUN 34* 33*  CREATININE 1.29* 1.23*  CALCIUM 9.2 9.2  MG 2.0  --    Liver Function Tests  Recent Labs  08/22/14 0420 08/23/14 0348  AST 18 20  ALT 12* 13*  ALKPHOS 72 76  BILITOT 0.2* 0.4  PROT 6.8 6.6  ALBUMIN 3.5 3.4*   No results for  input(s): LIPASE, AMYLASE in the last 72 hours. Cardiac Enzymes No results for input(s): CKTOTAL, CKMB, CKMBINDEX, TROPONINI in the last 72 hours. BNP Invalid input(s): POCBNP D-Dimer No results for input(s): DDIMER in the last 72 hours. Hemoglobin A1C No results for input(s): HGBA1C in the last 72 hours. Fasting Lipid Panel No results for input(s): CHOL, HDL, LDLCALC, TRIG, CHOLHDL, LDLDIRECT in the last 72 hours. Thyroid Function Tests No results for input(s): TSH, T4TOTAL, T3FREE, THYROIDAB in the last 72 hours.  Invalid input(s): FREET3  TELE  Sinus bradycardia at 50 bpm  ECG    Radiology/Studies  Mr Brain Wo Contrast  08/21/2014   CLINICAL DATA:  Mental status changes. Rapid atrial fibrillation. Confusion. Memory loss.  EXAM: MRI HEAD WITHOUT CONTRAST  TECHNIQUE: Multiplanar, multiecho pulse sequences of the brain and surrounding structures were obtained without intravenous contrast.  COMPARISON:  None.  FINDINGS: The diffusion-weighted images demonstrate  no evidence for acute or subacute infarction.  The study is moderately degraded by patient motion. Moderate atrophy and periventricular white matter change bilaterally is somewhat advanced for age. No acute infarct, hemorrhage, or mass lesion is present. The ventricles are of normal size. No significant extra-axial fluid collections are present.  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 White matter changes extend into the brainstem.  Flow is present in the major intracranial arteries. Bilateral lens replacements are present. Circumferential mucosal thickening is present in the posterior sphenoid sinus bilaterally. The remaining paranasal sinuses and the mastoid air cells are clear.  Midline structures are within normal limits.  IMPRESSION: 1. No acute intracranial abnormality. 2. Age advanced atrophy and white matter disease. This likely reflects the sequela of chronic microvascular ischemia.   Electronically Signed   By: San Morelle M.D.   On: 08/21/2014 14:28   US Renal  08/21/2014   CLINICAL DATA:  81 year old female with acute renal injury. Initial encounter.  EXAM: RENAL / URINARY TRACT ULTRASOUND COMPLETE  COMPARISON:  Lumbar spine MRI 01/11/2013.  Chest CTA 02/01/2014.  FINDINGS: Right Kidney:  Length: 10.6 cm. No hydronephrosis. Cortical echogenicity within normal limits. No right renal mass.  Left Kidney:  Length: 10.1 cm. No hydronephrosis. Cortical echotexture within normal limits. No left renal mass.  Bladder:  Decompressed, Foley catheter balloon visible.  IMPRESSION: No hydronephrosis or acute renal findings.   Electronically Signed   By: Genevie Ann M.D.   On: 08/21/2014 16:13   Dg Chest Port 1 View  08/18/2014   CLINICAL DATA:  Shortness of breath today with exertion.  EXAM: PORTABLE CHEST - 1 VIEW  COMPARISON:  03/31/2014  FINDINGS: Heart is borderline in size. No confluent airspace opacities, effusions or edema. No acute bony abnormality.  IMPRESSION: No active disease.    Electronically Signed   By: Rolm Baptise M.D.   On: 08/18/2014 11:39    ASSESSMENT AND PLAN  1.  Paroxysmal atrial fibrillation, currently back in sinus bradycardia following successful cardioversion. 2. Mild to moderate aortic stenosis - Echo 8/6 Valve area (VTI): 1.21 cm^2. Valve area, (Vmax): 1.14 cm^2. Valve area (Vmean): 1.25 cm^2. 3. Diabetes mellitus with peripheral neuropathy - HgbA1c of 8.6 - per primary 4. Hypertensive heart disease  Plan: We will resume her bisoprolol which was held earlier today.  Prior to admission she had been on 5 mg a day.  We will send her home on 10 mg daily.  Okay for discharge home from cardiac standpoint.  She should follow-up with Dr. Mare Ferrari or nurse practitioner/PA  in about a week Signed, Warren Danes MD

## 2014-08-24 NOTE — Progress Notes (Signed)
Pt discharged to [facility]. Pt did void after foley catheter removed. Ivs have been removed. Family is at bedside. Report called to RN at [facility]. Etta Quill, RN

## 2014-08-24 NOTE — ED Notes (Signed)
Per EMS - pt comes from [facility] with c/o bradycardia and per staff, "not acting herself."  Pt states she is tired and cold, A&O x 4. Pt was discharged from [facility] this morning after being treated for A-Fib (cardioversion) and UTI.  Per staff, pt was found lethargic, she was put on 2L O2 [place] by staff, and staff reports that she then came around a little more.  EMS VS: HR 50, 88% on 2L O2 [place], 101/60, 225 CBG.  Pt denies pain, states she just couldn't stay warm.

## 2014-08-24 NOTE — ED Provider Notes (Signed)
CSN: [id number]     Arrival date & time 08/24/14  1844 History   First MD Initiated Contact with Patient 08/24/14 1851     Chief Complaint  Patient presents with  . Bradycardia     (Consider location/radiation/quality/duration/timing/severity/associated sxs/prior Treatment) Patient is a 81 y.o. female presenting with altered mental status. The history is provided by the patient, the EMS personnel and the nursing home.  Altered Mental Status Presenting symptoms: confusion   Severity:  Moderate Most recent episode:  Today Episode history:  Multiple Duration:  6 hours Timing:  Constant Progression:  Unchanged Chronicity:  Recurrent Context: dementia and nursing home resident   Associated symptoms: no fever, no headaches, no nausea, no palpitations and no vomiting    81 yo F with a chief complaint of altered mental status. Patient was discharged home this morning nursing home felt that she was somewhat more confused than at her baseline. Since that she felt cold and then was yelling at different staff members. Also concerned if new bradycardia into the 50s. Patient was recently admitted for A. fib with RVR had a cardioversion. Patient states that she is just very tired from not sleeping in hospital for the past 5 days. Denies unilateral weakness. Denies fevers chills.  Past Medical History  Diagnosis Date  . Mild aortic stenosis     a. 10/2010 Echo: Ef 60-65%, no rwma, mild LVH no AS.  Marland Kitchen Chronic back pain   . Peripheral neuropathy   . GERD (gastroesophageal reflux disease)   . Hypertension   . History of pneumonia   . History of bronchitis   . Basal cell carcinoma     a. 2013.  . Non-cardiac chest pain     LHC w/ nl coronaries and nl EF 60% -12/2013  . PAC (premature atrial contraction)   . Heart murmur   . Type II diabetes mellitus     TYPE 2   Past Surgical History  Procedure Laterality Date  . Vesicovaginal fistula closure w/ tah  1964  . US echocardiography  04-26-09     EF 55-60%  . Cardiovascular stress test  05-13-2005    EF 67%  . Knee surgery    . Joint replacement  2000    left knee  . Abdominal hysterectomy  1964  . Toe shortened      right 2nd toe  . Cataract extraction      both eyes  . Mass excision  01/13/2012    Procedure: EXCISION MASS;  Surgeon: Ralene Ok, MD;  Location: [facility];  Service: General;  Laterality: Right;  Excision of Right Back Mass  . Left heart catheterization with coronary angiogram N/A 01/03/2014    Procedure: LEFT HEART CATHETERIZATION WITH CORONARY ANGIOGRAM;  Surgeon: Lorretta Harp, MD;  Location: [facility] CATH LAB;  Service: Cardiovascular;  Laterality: N/A;  . Cardiac catheterization    . Eye surgery      cataract  . Tee without cardioversion N/A 08/23/2014    Procedure: TRANSESOPHAGEAL ECHOCARDIOGRAM (TEE);  Surgeon: Jerline Pain, MD;  Location: [place];  Service: Cardiovascular;  Laterality: N/A;  . Cardioversion N/A 08/23/2014    Procedure: CARDIOVERSION;  Surgeon: Jerline Pain, MD;  Location: [facility] ENDOSCOPY;  Service: Cardiovascular;  Laterality: N/A;   Family History  Problem Relation Age of Onset  . Other Mother     died @ 70, complications following childbirth  . COPD Father     died @ 57  . Heart failure Father   .  Irregular heart beat Sister     1 sister with PPM.   Social History  Substance Use Topics  . Smoking status: Former Smoker -- 0.50 packs/day for 5 years    Types: Cigarettes    Quit date: 01/14/1980  . Smokeless tobacco: Never Used  . Alcohol Use: No   OB History    No data available     Review of Systems  Constitutional: Negative for fever and chills.  HENT: Negative for congestion and rhinorrhea.   Eyes: Negative for redness and visual disturbance.  Respiratory: Negative for shortness of breath and wheezing.   Cardiovascular: Negative for chest pain and palpitations.  Gastrointestinal: Negative for nausea and vomiting.  Genitourinary: Negative for dysuria and urgency.   Musculoskeletal: Negative for myalgias and arthralgias.  Skin: Negative for pallor and wound.  Neurological: Negative for dizziness and headaches.  Psychiatric/Behavioral: Positive for confusion.      Allergies  Codeine; Demerol; Doxycycline; Statins; and Zetia  Home Medications   Prior to Admission medications   Medication Sig Start Date End Date Taking? Authorizing Provider  acetaminophen (TYLENOL) 325 MG tablet Take 2 tablets (650 mg total) by mouth every 6 (six) hours as needed for mild pain (or Fever >/= 101). 08/24/14  Yes Albertine Patricia, MD  albuterol (PROVENTIL HFA;VENTOLIN HFA) 108 (90 BASE) MCG/ACT inhaler Inhale 1 puff into the lungs every 6 (six) hours as needed for wheezing or shortness of breath.   Yes Historical Provider, MD  BIOTIN PO Take 1 tablet by mouth daily.   Yes Historical Provider, MD  budesonide-formoterol (SYMBICORT) 80-4.5 MCG/ACT inhaler Inhale 2 puffs into the lungs 2 (two) times daily. 04/12/14  Yes Tanda Rockers, MD  calcium citrate (CALCITRATE - DOSED IN MG ELEMENTAL CALCIUM) 950 MG tablet Take 200 mg of elemental calcium by mouth daily.   Yes Historical Provider, MD  cholecalciferol (VITAMIN D) 1000 UNITS tablet Take 1,000 Units by mouth daily.   Yes Historical Provider, MD  cyanocobalamin (,VITAMIN B-12,) 1000 MCG/ML injection Inject 1 mL (1,000 mcg total) into the muscle once a week. 08/24/14  Yes Albertine Patricia, MD  famotidine (PEPCID) 20 MG tablet Take 20 mg by mouth daily.   Yes Historical Provider, MD  feeding supplement, ENSURE ENLIVE, (ENSURE ENLIVE) LIQD Take 237 mLs by mouth 2 (two) times daily between meals. 08/24/14  Yes Albertine Patricia, MD  folic acid (FOLVITE) 1 MG tablet Take 1 tablet (1 mg total) by mouth daily. 08/24/14  Yes Albertine Patricia, MD  furosemide (LASIX) 20 MG tablet Take 1 tablet (20 mg total) by mouth daily. 08/24/14  Yes Albertine Patricia, MD  gabapentin (NEURONTIN) 300 MG capsule Take 2 capsules (600 mg total) by  mouth 2 (two) times daily. 02/05/14  Yes Orson Eva, MD  glimepiride (AMARYL) 2 MG tablet Take 2 mg by mouth daily with breakfast.   Yes Historical Provider, MD  ipratropium-albuterol (DUONEB) 0.5-2.5 (3) MG/3ML SOLN Take 3 mLs by nebulization every 6 (six) hours as needed.    Yes Historical Provider, MD  losartan (COZAAR) 50 MG tablet Take 50 mg by mouth 2 (two) times daily. 1/2 tablet by mouth daily 03/23/14  Yes Historical Provider, MD  metFORMIN (GLUCOPHAGE) 1000 MG tablet Take 1 tablet (1,000 mg total) by mouth 2 (two) times daily with a meal. 01/06/14  Yes Brittainy Erie Noe, PA-C  nitroGLYCERIN (NITROSTAT) 0.4 MG SL tablet Place 1 tablet (0.4 mg total) under the tongue every 5 (five) minutes as needed  for chest pain. 01/02/14  Yes Darlin Coco, MD  pantoprazole (PROTONIX) 40 MG tablet Take 40 mg by mouth every morning.    Yes Historical Provider, MD  rivaroxaban (XARELTO) 20 MG TABS tablet Take 1 tablet (20 mg total) by mouth daily with supper. 08/24/14  Yes Silver Huguenin Elgergawy, MD  rosuvastatin (CRESTOR) 5 MG tablet Take 5 mg by mouth at bedtime.   Yes Historical Provider, MD  Spacer/Aero-Holding Chambers (AEROCHAMBER MV) inhaler Use as instructed 03/14/14  Yes Tanda Rockers, MD  bisoprolol (ZEBETA) 5 MG tablet Take 1.5 tablets (7.5 mg total) by mouth daily. 08/24/14   Deno Etienne, DO   BP 142/55 mmHg  Pulse 65  Temp(Src) 98 F (36.7 C) (Oral)  Resp 25  Ht 5\' 5"  (1.651 m)  Wt 225 lb (102.059 kg)  BMI 37.44 kg/m2  SpO2 97% Physical Exam  Constitutional: She is oriented to person, place, and time. She appears well-developed and well-nourished. No distress.  Obese   HENT:  Head: Normocephalic and atraumatic.  Eyes: EOM are normal. Pupils are equal, round, and reactive to light.  Neck: Normal range of motion. Neck supple.  Cardiovascular: An irregular rhythm present. Bradycardia present.  Exam reveals no gallop and no friction rub.   No murmur heard. Pulmonary/Chest: Effort normal. She  has no wheezes. She has no rales.  Abdominal: Soft. She exhibits no distension. There is no tenderness. There is no rebound and no guarding.  Musculoskeletal: She exhibits no edema or tenderness.  Neurological: She is alert and oriented to person, place, and time.  Skin: Skin is warm and dry. She is not diaphoretic.  Psychiatric: She has a normal mood and affect. Her behavior is normal.    ED Course  Procedures (including critical care time) Labs Review Labs Reviewed  COMPREHENSIVE METABOLIC PANEL - Abnormal; Notable for the following:    Chloride 95 (*)    Glucose, Bld 173 (*)    BUN 43 (*)    Creatinine, Ser 1.39 (*)    ALT 13 (*)    GFR calc non Af Amer 34 (*)    GFR calc Af Amer 40 (*)    All other components within normal limits  CBC WITH DIFFERENTIAL/PLATELET - Abnormal; Notable for the following:    RBC 3.86 (*)    Hemoglobin 10.6 (*)    HCT 35.5 (*)    MCHC 29.9 (*)    Monocytes Absolute 1.2 (*)    All other components within normal limits  URINALYSIS, ROUTINE W REFLEX MICROSCOPIC (NOT AT [facility]) - Abnormal; Notable for the following:    Protein, ur 30 (*)    Leukocytes, UA MODERATE (*)    All other components within normal limits  URINE MICROSCOPIC-ADD ON - Abnormal; Notable for the following:    Squamous Epithelial / LPF MANY (*)    Bacteria, UA FEW (*)    All other components within normal limits  I-STAT CHEM 8, ED - Abnormal; Notable for the following:    Chloride 97 (*)    BUN 49 (*)    Creatinine, Ser 1.40 (*)    Glucose, Bld 170 (*)    Calcium, Ion 1.06 (*)    All other components within normal limits  CBG MONITORING, ED - Abnormal; Notable for the following:    Glucose-Capillary 119 (*)    All other components within normal limits  URINE CULTURE  I-STAT CG4 LACTIC ACID, ED    Imaging Review Ct Head Wo Contrast  08/24/2014  CLINICAL DATA:  81 year old female with altered mental status. Lethargy.  EXAM: CT HEAD WITHOUT CONTRAST  TECHNIQUE: Contiguous  axial images were obtained from the base of the skull through the vertex without intravenous contrast.  COMPARISON:  No priors.  FINDINGS: Mild cerebral atrophy. Patchy and confluent areas of decreased attenuation are noted throughout the deep and periventricular white matter of the cerebral hemispheres bilaterally, compatible with chronic microvascular ischemic disease. No acute intracranial abnormalities. Specifically, no evidence of acute intracranial hemorrhage, no definite findings of acute/subacute cerebral ischemia, no mass, mass effect, hydrocephalus or abnormal intra or extra-axial fluid collections. Visualized paranasal sinuses and mastoids are generally well pneumatized, with exception of extensive mucosal thickening in the sphenoid sinuses. No acute displaced skull fractures are identified.  IMPRESSION: 1. No acute intracranial abnormalities. 2. Mild cerebral atrophy and chronic microvascular ischemic changes in the cerebral white matter. 3. Extensive mucosal thickening in the sphenoid sinuses, extensive mucosal thickening in sphenoid sinuses. No air-fluid levels to suggest an acute sinusitis at this time.   Electronically Signed   By: Vinnie Langton M.D.   On: 08/24/2014 21:34   Dg Chest Port 1 View  08/24/2014   CLINICAL DATA:  Pt c/o being "tired and cold" x1 day. Pt was released from [facility] this morning after being treated for A.Fib.Pt denies pain and SOB.Hx of HTN, DM, mild aortic stenosis, PNA, bronchitis.Nonsmoker.  EXAM: PORTABLE CHEST - 1 VIEW  COMPARISON:  08/18/2014  FINDINGS: Mild enlargement of the cardiac silhouette. No mediastinal or hilar masses or evidence of adenopathy.  Clear lungs.  No pleural effusion or pneumothorax.  Bony thorax is demineralized but grossly intact.  IMPRESSION: No active disease.   Electronically Signed   By: Lajean Manes M.D.   On: 08/24/2014 19:32   I, Cecilio Asper, personally reviewed and evaluated these images and lab results as part of my  medical decision-making.   EKG Interpretation None      MDM   Final diagnoses:  Bradycardia  Confusion    81 yo F with a chief complaint of altered mental status. Patient alert and oriented on my exam. Moving all 4 extremities. Neuro exam benign. Laboratory evaluation unremarkable. Contaminated urine. Patient currently being treated for a urinary tract infection. Chest x-ray without pneumonia. We will CT the head due to altered mental status.  CT head negative. Patient remained alert and oriented throughout the stay. Lab work unremarkable. Family arrived at bedside and concerned about irregular heartbeat. Concerned that she may have left the hospital too early. Have already filled out a formal complaint. Multiple EKGs performed appear to be in an irregular sinus bradycardia.   Spoke with Dr. Roxan Hockey on call for cardiology review the EKGs. Agree with my assessment. Would like to decrease the beta blocker to 7.5 mg daily.  Discussed with family, agree for discharge back to facility.  11:36 PM:  I have discussed the diagnosis/risks/treatment options with the patient and family and believe the pt to be eligible for discharge home to follow-up with PCP. We also discussed returning to the ED immediately if new or worsening sx occur. We discussed the sx which are most concerning (e.g., worsening symptoms, syncope) that necessitate immediate return. Medications administered to the patient during their visit and any new prescriptions provided to the patient are listed below.  Medications given during this visit Medications  sodium chloride 0.9 % bolus 250 mL (0 mLs Intravenous Stopped 08/24/14 2111)    New Prescriptions   No medications on  file     The patient appears reasonably screen and/or stabilized for discharge and I doubt any other medical condition or other [facility] requiring further screening, evaluation, or treatment in the ED at this time prior to discharge.    Deno Etienne,  DO 08/24/14 2336

## 2014-08-24 NOTE — ED Notes (Signed)
Pt verbalized understanding of d/c instructions and has no further questions. Report called to [facility] nurse. Pt stable and NAD waiting for PTAR

## 2014-08-25 ENCOUNTER — Encounter: Payer: Self-pay | Admitting: Adult Health

## 2014-08-25 ENCOUNTER — Non-Acute Institutional Stay (SKILLED_NURSING_FACILITY): Payer: Medicare HMO | Admitting: Adult Health

## 2014-08-25 DIAGNOSIS — J449 Chronic obstructive pulmonary disease, unspecified: Secondary | ICD-10-CM

## 2014-08-25 DIAGNOSIS — I4891 Unspecified atrial fibrillation: Secondary | ICD-10-CM | POA: Diagnosis not present

## 2014-08-25 DIAGNOSIS — E1140 Type 2 diabetes mellitus with diabetic neuropathy, unspecified: Secondary | ICD-10-CM

## 2014-08-25 DIAGNOSIS — K219 Gastro-esophageal reflux disease without esophagitis: Secondary | ICD-10-CM | POA: Diagnosis not present

## 2014-08-25 DIAGNOSIS — E785 Hyperlipidemia, unspecified: Secondary | ICD-10-CM

## 2014-08-25 DIAGNOSIS — R5381 Other malaise: Secondary | ICD-10-CM

## 2014-08-25 DIAGNOSIS — M792 Neuralgia and neuritis, unspecified: Secondary | ICD-10-CM

## 2014-08-25 DIAGNOSIS — I5030 Unspecified diastolic (congestive) heart failure: Secondary | ICD-10-CM

## 2014-08-25 DIAGNOSIS — G4700 Insomnia, unspecified: Secondary | ICD-10-CM

## 2014-08-25 DIAGNOSIS — I10 Essential (primary) hypertension: Secondary | ICD-10-CM

## 2014-08-25 DIAGNOSIS — B372 Candidiasis of skin and nail: Secondary | ICD-10-CM | POA: Diagnosis not present

## 2014-08-25 NOTE — Anesthesia Postprocedure Evaluation (Signed)
  Anesthesia Post-op Note  Patient: [name] [name]  Procedure(s) Performed: Procedure(s): TRANSESOPHAGEAL ECHOCARDIOGRAM (TEE) (N/A) CARDIOVERSION (N/A)  Patient Location: PACU  Anesthesia Type:MAC  Level of Consciousness: awake and alert   Airway and Oxygen Therapy: Patient Spontanous Breathing  Post-op Pain: none  Post-op Assessment: Post-op Vital signs reviewed              Post-op Vital Signs: Reviewed  Last Vitals:  Filed Vitals:   08/24/14 1430  BP:   Pulse: 54  Temp:   Resp: 26    Complications: No apparent anesthesia complications

## 2014-08-25 NOTE — Clinical Social Work Placement (Addendum)
   CLINICAL SOCIAL WORK PLACEMENT  NOTE  Date:  08/25/2014  Patient Details  Name: SHADASIA OLDFIELD MRN: [id number] Date of Birth: 28-Apr-1930  Clinical Social Work is seeking post-discharge placement for this patient at the [place] level of care (*CSW will initial, date and re-position this form in  chart as items are completed):  Yes   Patient/family provided with [place] Work Department's list of facilities offering this level of care within the geographic area requested by the patient (or if unable, by the patient's family).  Yes   Patient/family informed of their freedom to choose among providers that offer the needed level of care, that participate in Medicare, Medicaid or managed care program needed by the patient, have an available bed and are willing to accept the patient.  Yes   Patient/family informed of [facility]'s ownership interest in [facility] and [facility], as well as of the fact that they are under no obligation to receive care at these facilities.  PASRR submitted to EDS on       PASRR number received on       Existing PASRR number confirmed on 08/21/14     FL2 transmitted to all facilities in geographic area requested by pt/family on 08/21/14     FL2 transmitted to all facilities within larger geographic area on       Patient informed that his/her managed care company has contracts with or will negotiate with certain facilities, including the following:        Yes   Patient/family informed of bed offers received.  Patient chooses bed at [facility]     Physician recommends and patient chooses bed at      Patient to be transferred to [facility] on 08/24/14.  Patient to be transferred to facility by ambulance  Corey Harold)     Patient family notified on 08/24/14 of transfer.  Name of family member notified:  Hulda Marin     PHYSICIAN Please sign FL2, Please sign DNR, Please prepare priority discharge summary,  including medications, Please prepare prescriptions     Additional Comment: Ok per MD for d/c today to [facility].  DC packet prepared and nursing notified to call report.  Family and patient are aware of d/c.  CSW spoke briefly with patient's son who wanted to speak with Dr. Waldron Labs prior to patient's d/c. CSW notified Dr. Waldron Labs re: same.  [facility] is aware of d/c and state bed is available.  CSW will sign off.  Lorie Phenix. Murrell Redden [phone]      _______________________________________________ Williemae Area, LCSW 08/24/2014, 3:10 PM

## 2014-08-26 LAB — URINE CULTURE

## 2014-08-28 NOTE — Progress Notes (Addendum)
Patient ID: [name] [name], female   DOB: [birth year], 81 y.o.   MRN: [id number]    DATE:  08/25/14 MRN:  [id number]  BIRTHDAY: [birth year]  Facility:  Nursing Home Location:  [place] Room Number: 706-P  LEVEL OF CARE:  SNF (31)  Contact Information    Name Relation Home Work [place] Daughter   [phone]   Shanine, Kreiger [phone]     [name] [phone]     Crusoe,Deb Other   [phone]       Chief Complaint  Patient presents with  . Hospitalization Follow-up    Physical deconditioning, A-Fib with RVR,, hypertension, peripheral neuropathy, COPD, diabetes mellitus, GERD, hyperlipidemia, CHF, insomnia and Candida    HISTORY OF PRESENT ILLNESS:  An 81 year old female admitted to [facility] on 08/24/14 from [facility]. She has PMH of mild aortic stenosis, chronic back pain, peripheral neuropathy, GERD, hypertension, history of pneumonia, history of bronchitis, basal cell ca, PAC, heart murmur, type 2 diabetes mellitus and diastolic CHF. Patient was brought to ED due to confusion. EMS found her to be hypoxic and when brought to ED she was  91% O2 sat on room air and has improved to 99% on 2 L/m via [place]. EKG showed rapid A. fib with HR 151. She was started on Cardizem IV and then transitioned to beta blockers PO. She had electrical cardioversion on 8/10 and converted to NSR.  She has been admitted for a short-term rehabilitation.   PAST  rapid A. fibMEDICAL HISTORY:  Past Medical History  Diagnosis Date  . Mild aortic stenosis     a. 10/2010 Echo: Ef 60-65%, no rwma, mild LVH no AS.  Marland Kitchen Chronic back pain   . Peripheral neuropathy   . GERD (gastroesophageal reflux disease)   . Hypertension   . History of pneumonia   . History of bronchitis   . Basal cell carcinoma     a. 2013.  . Non-cardiac chest pain     LHC w/ nl coronaries and nl EF 60% -12/2013  . PAC (premature atrial contraction)   .  Heart murmur   . Type II diabetes mellitus     TYPE 2     CURRENT MEDICATIONS: Reviewed     Medication List       This list is accurate as of: 08/25/14 11:59 PM.  Always use your most recent med list.               acetaminophen 325 MG tablet  Commonly known as:  TYLENOL  Take 2 tablets (650 mg total) by mouth every 6 (six) hours as needed for mild pain (or Fever >/= 101).     AEROCHAMBER MV inhaler  Use as instructed     albuterol 108 (90 BASE) MCG/ACT inhaler  Commonly known as:  PROVENTIL HFA;VENTOLIN HFA  Inhale 1 puff into the lungs every 6 (six) hours as needed for wheezing or shortness of breath.     BIOTIN PO  Take 1 tablet by mouth daily.     bisoprolol 5 MG tablet  Commonly known as:  ZEBETA  Take 1.5 tablets (7.5 mg total) by mouth daily.     budesonide-formoterol 80-4.5 MCG/ACT inhaler  Commonly known as:  SYMBICORT  Inhale 2 puffs into the lungs 2 (two) times daily.     calcium citrate 950 MG tablet  Commonly known as:  CALCITRATE - dosed in mg elemental calcium  Take 200 mg  of elemental calcium by mouth daily.     cholecalciferol 1000 UNITS tablet  Commonly known as:  VITAMIN D  Take 1,000 Units by mouth daily.     cyanocobalamin 1000 MCG/ML injection  Commonly known as:  (VITAMIN B-12)  Inject 1 mL (1,000 mcg total) into the muscle once a week.     famotidine 20 MG tablet  Commonly known as:  PEPCID  Take 20 mg by mouth daily.     feeding supplement (ENSURE ENLIVE) Liqd  Take 237 mLs by mouth 2 (two) times daily between meals.     folic acid 1 MG tablet  Commonly known as:  FOLVITE  Take 1 tablet (1 mg total) by mouth daily.     furosemide 20 MG tablet  Commonly known as:  LASIX  Take 1 tablet (20 mg total) by mouth daily.     gabapentin 300 MG capsule  Commonly known as:  NEURONTIN  Take 2 capsules (600 mg total) by mouth 2 (two) times daily.     glimepiride 2 MG tablet  Commonly known as:  AMARYL  Take 2 mg by mouth daily with  breakfast.     ipratropium-albuterol 0.5-2.5 (3) MG/3ML Soln  Commonly known as:  DUONEB  Take 3 mLs by nebulization every 6 (six) hours as needed.     losartan 50 MG tablet  Commonly known as:  COZAAR  Take 50 mg by mouth 2 (two) times daily. 1/2 tablet by mouth daily     Melatonin 3 MG Tabs  Take 3 mg by mouth at bedtime.     metFORMIN 1000 MG tablet  Commonly known as:  GLUCOPHAGE  Take 1 tablet (1,000 mg total) by mouth 2 (two) times daily with a meal.     nitroGLYCERIN 0.4 MG SL tablet  Commonly known as:  NITROSTAT  Place 1 tablet (0.4 mg total) under the tongue every 5 (five) minutes as needed for chest pain.     nystatin 100000 UNIT/GM Powd  Apply topically 2 (two) times daily.     pantoprazole 40 MG tablet  Commonly known as:  PROTONIX  Take 40 mg by mouth every morning.     rivaroxaban 20 MG Tabs tablet  Commonly known as:  XARELTO  Take 1 tablet (20 mg total) by mouth daily with supper.     rosuvastatin 5 MG tablet  Commonly known as:  CRESTOR  Take 5 mg by mouth at bedtime.         Allergies  Allergen Reactions  . Codeine Nausea And Vomiting  . Demerol Other (See Comments)    Drunk feeling"  . Doxycycline Nausea And Vomiting  . Statins Other (See Comments)    "hurt"   . Zetia [Ezetimibe] Other (See Comments)    hurt     REVIEW OF SYSTEMS:  GENERAL: no change in appetite, no fatigue, no weight changes, no fever, chills or weakness EYES: Denies change in vision, dry eyes, eye pain, itching or discharge EARS: Denies change in hearing, ringing in ears, or earache NOSE: Denies nasal congestion or epistaxis MOUTH and THROAT: Denies oral discomfort, gingival pain or bleeding, pain from teeth or hoarseness   RESPIRATORY: no cough, SOB, DOE, wheezing, hemoptysis CARDIAC: no chest pain, edema or palpitations GI: no abdominal pain, diarrhea, constipation, heart burn, nausea or vomiting GU: Denies dysuria, frequency, hematuria, incontinence, or  discharge PSYCHIATRIC: Denies feeling of depression or anxiety. No report of hallucinations, paranoia, or agitation, +insomnia   PHYSICAL EXAMINATION  GENERAL  APPEARANCE: Well nourished. In no acute distress. Obese SKIN:  Erythematous rashes on bilateral abdominal folds HEAD: Normal in size and contour. No evidence of trauma EYES: Lids open and close normally. No blepharitis, entropion or ectropion. PERRL. Conjunctivae are clear and sclerae are white. Lenses are without opacity EARS: Pinnae are normal. Patient hears normal voice tunes of the examiner MOUTH and THROAT: Lips are without lesions. Oral mucosa is moist and without lesions. Tongue is normal in shape, size, and color and without lesions NECK: supple, trachea midline, no neck masses, no thyroid tenderness, no thyromegaly LYMPHATICS: no LAN in the neck, no supraclavicular LAN RESPIRATORY: breathing is even & unlabored, BS CTAB CARDIAC: RRR, no murmur,no extra heart sounds, no edema GI: abdomen soft, normal BS, no masses, no tenderness, no hepatomegaly, no splenomegaly PSYCHIATRIC: Alert and oriented X 3. Affect and behavior are appropriate  LABS/RADIOLOGY: Labs reviewed: Basic Metabolic Panel:  Recent Labs  08/18/14 1411  08/20/14 0333  08/23/14 0348 08/23/14 1240 08/24/14 1910 08/24/14 1919  NA  --   < > 139  < > 141 137 138 139  K  --   < > 3.5  < > 5.2* 4.9 4.5 4.5  CL  --   < > 96*  < > 98* 96* 95* 97*  CO2  --   < > 34*  < > 36* 31 32  --   GLUCOSE  --   < > 172*  < > 202* 201* 173* 170*  BUN  --   < > 22*  < > 34* 33* 43* 49*  CREATININE  --   < > 1.56*  < > 1.29* 1.23* 1.39* 1.40*  CALCIUM  --   < > 8.5*  < > 9.2 9.2 9.0  --   MG 1.4*  --  2.2  --  2.0  --   --   --   < > = values in this interval not displayed. Liver Function Tests:  Recent Labs  08/22/14 0420 08/23/14 0348 08/24/14 1910  AST 18 20 23   ALT 12* 13* 13*  ALKPHOS 72 76 84  BILITOT 0.2* 0.4 0.4  PROT 6.8 6.6 7.1  ALBUMIN 3.5 3.4* 3.7    CBC:  Recent Labs  02/01/14 1730  08/18/14 1115  08/22/14 0420 08/23/14 0348 08/24/14 1910 08/24/14 1919  WBC 15.9*  < > 12.8*  < > 11.3* 10.1 10.0  --   NEUTROABS 12.4*  --  11.0*  --   --   --  6.7  --   HGB 11.9*  < > 10.4*  < > 9.8* 10.2* 10.6* 12.6  HCT 38.1  < > 33.6*  < > 33.2* 33.9* 35.5* 37.0  MCV 99.5  < > 89.6  < > 91.0 90.4 92.0  --   PLT 232  < > 234  < > 235 221 208  --   < > = values in this interval not displayed.  Cardiac Enzymes:  Recent Labs  01/03/14 1200 01/09/14 1938 02/01/14 1730  TROPONINI <0.03 <0.03 <0.03   CBG:  Recent Labs  08/24/14 0744 08/24/14 1145 08/24/14 2049  GLUCAP 146* 205* 119*    Ct Head Wo Contrast  08/24/2014   CLINICAL DATA:  81 year old female with altered mental status. Lethargy.  EXAM: CT HEAD WITHOUT CONTRAST  TECHNIQUE: Contiguous axial images were obtained from the base of the skull through the vertex without intravenous contrast.  COMPARISON:  No priors.  FINDINGS: Mild cerebral atrophy. Patchy and  confluent areas of decreased attenuation are noted throughout the deep and periventricular white matter of the cerebral hemispheres bilaterally, compatible with chronic microvascular ischemic disease. No acute intracranial abnormalities. Specifically, no evidence of acute intracranial hemorrhage, no definite findings of acute/subacute cerebral ischemia, no mass, mass effect, hydrocephalus or abnormal intra or extra-axial fluid collections. Visualized paranasal sinuses and mastoids are generally well pneumatized, with exception of extensive mucosal thickening in the sphenoid sinuses. No acute displaced skull fractures are identified.  IMPRESSION: 1. No acute intracranial abnormalities. 2. Mild cerebral atrophy and chronic microvascular ischemic changes in the cerebral white matter. 3. Extensive mucosal thickening in the sphenoid sinuses, extensive mucosal thickening in sphenoid sinuses. No air-fluid levels to suggest an acute  sinusitis at this time.   Electronically Signed   By: Vinnie Langton M.D.   On: 08/24/2014 21:34   Mr Brain Wo Contrast  08/21/2014   CLINICAL DATA:  Mental status changes. Rapid atrial fibrillation. Confusion. Memory loss.  EXAM: MRI HEAD WITHOUT CONTRAST  TECHNIQUE: Multiplanar, multiecho pulse sequences of the brain and surrounding structures were obtained without intravenous contrast.  COMPARISON:  None.  FINDINGS: The diffusion-weighted images demonstrate no evidence for acute or subacute infarction.  The study is moderately degraded by patient motion. Moderate atrophy and periventricular white matter change bilaterally is somewhat advanced for age. No acute infarct, hemorrhage, or mass lesion is present. The ventricles are of normal size. No significant extra-axial fluid collections are present.  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 White matter changes extend into the brainstem.  Flow is present in the major intracranial arteries. Bilateral lens replacements are present. Circumferential mucosal thickening is present in the posterior sphenoid sinus bilaterally. The remaining paranasal sinuses and the mastoid air cells are clear.  Midline structures are within normal limits.  IMPRESSION: 1. No acute intracranial abnormality. 2. Age advanced atrophy and white matter disease. This likely reflects the sequela of chronic microvascular ischemia.   Electronically Signed   By: San Morelle M.D.   On: 08/21/2014 14:28   US Renal  08/21/2014   CLINICAL DATA:  81 year old female with acute renal injury. Initial encounter.  EXAM: RENAL / URINARY TRACT ULTRASOUND COMPLETE  COMPARISON:  Lumbar spine MRI 01/11/2013.  Chest CTA 02/01/2014.  FINDINGS: Right Kidney:  Length: 10.6 cm. No hydronephrosis. Cortical echogenicity within normal limits. No right renal mass.  Left Kidney:  Length: 10.1 cm. No hydronephrosis. Cortical echotexture within normal limits. No left renal mass.  Bladder:   Decompressed, Foley catheter balloon visible.  IMPRESSION: No hydronephrosis or acute renal findings.   Electronically Signed   By: Genevie Ann M.D.   On: 08/21/2014 16:13   Dg Chest Port 1 View  08/24/2014   CLINICAL DATA:  Pt c/o being "tired and cold" x1 day. Pt was [facility] this morning after being treated for A.Fib.Pt denies pain and SOB.Hx of HTN, DM, mild aortic stenosis, PNA, bronchitis.Nonsmoker.  EXAM: PORTABLE CHEST - 1 VIEW  COMPARISON:  08/18/2014  FINDINGS: Mild enlargement of the cardiac silhouette. No mediastinal or hilar masses or evidence of adenopathy.  Clear lungs.  No pleural effusion or pneumothorax.  Bony thorax is demineralized but grossly intact.  IMPRESSION: No active disease.   Electronically Signed   By: Lajean Manes M.D.   On: 08/24/2014 19:32   Dg Chest Port 1 View  08/18/2014   CLINICAL DATA:  Shortness of breath today with exertion.  EXAM: PORTABLE CHEST - 1 VIEW  COMPARISON:  03/31/2014  FINDINGS: Heart is borderline in size. No confluent airspace opacities, effusions or edema. No acute bony abnormality.  IMPRESSION: No active disease.   Electronically Signed   By: Rolm Baptise M.D.   On: 08/18/2014 11:39    ASSESSMENT/PLAN:  Physical deconditioning - or rehabilitation  Atrial fibrillation with RVR  -  S/P cardioversion; rate controlled; continue Xarelto 20 mg 1 tab by mouth every supper and Bisoprolol 5 mg take 1 1/2 tab =7.5 mg daily; check CBC  Diabetic peripheral neuropathy - continue Neurontin 300 mg take 2 capsules = 600 mg by mouth twice a day    COPD, Gold II - continue Symbicort 18-4.5 g/ACT 2 puffs in to lungs twice a day, albuterol 108 g/ACT 1 puff into the lungs Q 6 hours PRN, Duoneb 23ml via neb Q 6 hrs PRN; follow-up with Dr Melvyn Novas; pulmonologist  Diabetes mellitus, type 2 - hgbA1c 8.6; continue Metformin 1000 mg PO BID and Amaryl 2 mg 1 tab PO daily  GERD - continue Pepcid 20 mg daily and Pantoprazole 40 mg PO Q HS  Hyperlipidemia -   Continue Crestor 5 mg 1 tab PO Q HS  Diastolic CHF - continue Lasix 20 mg 1 tab PO Q D, weigh daily and fluid restriction of 1500 ml; check BMP  Insomnia - start Melatonin 3 mg 1 tab PO Q HS  Candida, skin - start Nystatin powder 100,000 units/gm to rashes on bilateral abdominal folds BID X 14 days  Essential hypertension - continue Losartan 25 mg 1 tab PO BID and Zebeta 5 mg take 1 1/2 tab = 7.5 mg daily     Goals of care:  Short-term rehabilitation   [facility], NP [facility] [phone]

## 2014-08-29 ENCOUNTER — Non-Acute Institutional Stay (SKILLED_NURSING_FACILITY): Payer: Medicare HMO | Admitting: Internal Medicine

## 2014-08-29 DIAGNOSIS — E785 Hyperlipidemia, unspecified: Secondary | ICD-10-CM | POA: Diagnosis not present

## 2014-08-29 DIAGNOSIS — G4700 Insomnia, unspecified: Secondary | ICD-10-CM | POA: Diagnosis not present

## 2014-08-29 DIAGNOSIS — I10 Essential (primary) hypertension: Secondary | ICD-10-CM | POA: Diagnosis not present

## 2014-08-29 DIAGNOSIS — B9689 Other specified bacterial agents as the cause of diseases classified elsewhere: Secondary | ICD-10-CM | POA: Diagnosis not present

## 2014-08-29 DIAGNOSIS — K219 Gastro-esophageal reflux disease without esophagitis: Secondary | ICD-10-CM

## 2014-08-29 DIAGNOSIS — B961 Klebsiella pneumoniae [K. pneumoniae] as the cause of diseases classified elsewhere: Secondary | ICD-10-CM

## 2014-08-29 DIAGNOSIS — I4891 Unspecified atrial fibrillation: Secondary | ICD-10-CM

## 2014-08-29 DIAGNOSIS — R5381 Other malaise: Secondary | ICD-10-CM

## 2014-08-29 DIAGNOSIS — E1140 Type 2 diabetes mellitus with diabetic neuropathy, unspecified: Secondary | ICD-10-CM

## 2014-08-29 DIAGNOSIS — L299 Pruritus, unspecified: Secondary | ICD-10-CM | POA: Diagnosis not present

## 2014-08-29 DIAGNOSIS — J449 Chronic obstructive pulmonary disease, unspecified: Secondary | ICD-10-CM

## 2014-08-29 DIAGNOSIS — I5032 Chronic diastolic (congestive) heart failure: Secondary | ICD-10-CM

## 2014-08-29 DIAGNOSIS — N3090 Cystitis, unspecified without hematuria: Secondary | ICD-10-CM

## 2014-08-29 NOTE — Progress Notes (Signed)
Patient ID: [name] [name], female   DOB: 07-Mar-1930, 81 y.o.   MRN: [id number]     [facility]   PCP: [facility], [place]  Code Status: DNR  Allergies  Allergen Reactions  . Codeine Nausea And Vomiting  . Demerol Other (See Comments)    Drunk feeling"  . Doxycycline Nausea And Vomiting  . Statins Other (See Comments)    "hurt"   . Zetia [Ezetimibe] Other (See Comments)    hurt    Chief Complaint  Patient presents with  . New Admit To SNF     HPI:  81 y.o. patient is here for short term rehabilitation post hospital admission from 08/18/14-08/24/14 with confusion and respiratory distress in setting of afib with RVR and CHF. She also had klebsiella UTI and acute kidney injury. She responded well to iv cardizem and was then transitioned to beta blockers. She underwent TEE and cardioversion on 08/23/14. She has PMH of mild aortic stenosis, chronic back pain, peripheral neuropathy, GERD, hypertension, diabetes mellitus and diastolic CHF. She is seen in her room today. She complaints of itching in her legs. No other concerns.   Review of Systems:  Constitutional: Negative for fever, chills, diaphoresis.  HENT: Negative for headache, congestion, nasal discharge  Respiratory: Negative for cough, shortness of breath and wheezing.   Cardiovascular: Negative for chest pain, palpitations, leg swelling.  Gastrointestinal: Negative for heartburn, nausea, vomiting, abdominal pain Genitourinary: Negative for dysuria, flank pain.  Musculoskeletal: Negative for back pain, falls Skin: Negative for itching, rash.  Neurological: Negative for dizziness, tingling, focal weakness Psychiatric/Behavioral: Negative for depression  Past Medical History  Diagnosis Date  . Mild aortic stenosis     a. 10/2010 Echo: Ef 60-65%, no rwma, mild LVH no AS.  Marland Kitchen Chronic back pain   . Peripheral neuropathy   . GERD (gastroesophageal reflux disease)   . Hypertension   . History of  pneumonia   . History of bronchitis   . Basal cell carcinoma     a. 2013.  . Non-cardiac chest pain     LHC w/ nl coronaries and nl EF 60% -12/2013  . PAC (premature atrial contraction)   . Heart murmur   . Type II diabetes mellitus     TYPE 2   Past Surgical History  Procedure Laterality Date  . Vesicovaginal fistula closure w/ tah  1964  . US echocardiography  04-26-09    EF 55-60%  . Cardiovascular stress test  05-13-2005    EF 67%  . Knee surgery    . Joint replacement  2000    left knee  . Abdominal hysterectomy  1964  . Toe shortened      right 2nd toe  . Cataract extraction      both eyes  . Mass excision  01/13/2012    Procedure: EXCISION MASS;  Surgeon: Ralene Ok, MD;  Location: [facility];  Service: General;  Laterality: Right;  Excision of Right Back Mass  . Left heart catheterization with coronary angiogram N/A 01/03/2014    Procedure: LEFT HEART CATHETERIZATION WITH CORONARY ANGIOGRAM;  Surgeon: Lorretta Harp, MD;  Location: [facility] CATH LAB;  Service: Cardiovascular;  Laterality: N/A;  . Cardiac catheterization    . Eye surgery      cataract  . Tee without cardioversion N/A 08/23/2014    Procedure: TRANSESOPHAGEAL ECHOCARDIOGRAM (TEE);  Surgeon: Jerline Pain, MD;  Location: [facility];  Service: Cardiovascular;  Laterality: N/A;  . Cardioversion N/A 08/23/2014  Procedure: CARDIOVERSION;  Surgeon: Jerline Pain, MD;  Location: [facility] ENDOSCOPY;  Service: Cardiovascular;  Laterality: N/A;   Social History:   reports that she quit smoking about 34 years ago. Her smoking use included Cigarettes. She has a 2.5 pack-year smoking history. She has never used smokeless tobacco. She reports that she does not drink alcohol or use illicit drugs.  Family History  Problem Relation Age of Onset  . Other Mother     died @ 25, complications following childbirth  . COPD Father     died @ 27  . Heart failure Father   . Irregular heart beat Sister     1 sister with PPM.     Medications:   Medication List       This list is accurate as of: 08/29/14  8:40 AM.  Always use your most recent med list.               acetaminophen 325 MG tablet  Commonly known as:  TYLENOL  Take 2 tablets (650 mg total) by mouth every 6 (six) hours as needed for mild pain (or Fever >/= 101).     AEROCHAMBER MV inhaler  Use as instructed     albuterol 108 (90 BASE) MCG/ACT inhaler  Commonly known as:  PROVENTIL HFA;VENTOLIN HFA  Inhale 1 puff into the lungs every 6 (six) hours as needed for wheezing or shortness of breath.     BIOTIN PO  Take 1 tablet by mouth daily.     bisoprolol 5 MG tablet  Commonly known as:  ZEBETA  Take 1.5 tablets (7.5 mg total) by mouth daily.     budesonide-formoterol 80-4.5 MCG/ACT inhaler  Commonly known as:  SYMBICORT  Inhale 2 puffs into the lungs 2 (two) times daily.     calcium citrate 950 MG tablet  Commonly known as:  CALCITRATE - dosed in mg elemental calcium  Take 200 mg of elemental calcium by mouth daily.     cholecalciferol 1000 UNITS tablet  Commonly known as:  VITAMIN D  Take 1,000 Units by mouth daily.     cyanocobalamin 1000 MCG/ML injection  Commonly known as:  (VITAMIN B-12)  Inject 1 mL (1,000 mcg total) into the muscle once a week.     famotidine 20 MG tablet  Commonly known as:  PEPCID  Take 20 mg by mouth daily.     feeding supplement (ENSURE ENLIVE) Liqd  Take 237 mLs by mouth 2 (two) times daily between meals.     folic acid 1 MG tablet  Commonly known as:  FOLVITE  Take 1 tablet (1 mg total) by mouth daily.     furosemide 20 MG tablet  Commonly known as:  LASIX  Take 1 tablet (20 mg total) by mouth daily.     gabapentin 300 MG capsule  Commonly known as:  NEURONTIN  Take 2 capsules (600 mg total) by mouth 2 (two) times daily.     glimepiride 2 MG tablet  Commonly known as:  AMARYL  Take 2 mg by mouth daily with breakfast.     ipratropium-albuterol 0.5-2.5 (3) MG/3ML Soln  Commonly known  as:  DUONEB  Take 3 mLs by nebulization every 6 (six) hours as needed.     losartan 50 MG tablet  Commonly known as:  COZAAR  Take 50 mg by mouth 2 (two) times daily. 1/2 tablet by mouth daily     Melatonin 3 MG Tabs  Take 3 mg by mouth at  bedtime.     metFORMIN 1000 MG tablet  Commonly known as:  GLUCOPHAGE  Take 1 tablet (1,000 mg total) by mouth 2 (two) times daily with a meal.     nitroGLYCERIN 0.4 MG SL tablet  Commonly known as:  NITROSTAT  Place 1 tablet (0.4 mg total) under the tongue every 5 (five) minutes as needed for chest pain.     nystatin 100000 UNIT/GM Powd  Apply topically 2 (two) times daily.     pantoprazole 40 MG tablet  Commonly known as:  PROTONIX  Take 40 mg by mouth every morning.     rivaroxaban 20 MG Tabs tablet  Commonly known as:  XARELTO  Take 1 tablet (20 mg total) by mouth daily with supper.     rosuvastatin 5 MG tablet  Commonly known as:  CRESTOR  Take 5 mg by mouth at bedtime.         Physical Exam: Filed Vitals:   08/29/14 0839  BP: 151/74  Pulse: 88  Temp: 98.4 F (36.9 C)  Resp: 18  Weight: 217 lb (98.431 kg)  SpO2: 97%    General- elderly female, overweight, in no acute distress Head- normocephalic, atraumatic Throat- moist mucus membrane Eyes- PERRLA, EOMI, no pallor, no icterus, no discharge, normal conjunctiva, normal sclera Neck- no cervical lymphadenopathy Cardiovascular- normal s1,s2, no murmurs, palpable dorsalis pedis and radial pulses, trace leg edema Respiratory- bilateral clear to auscultation, no wheeze, no rhonchi, no crackles, no use of accessory muscles Abdomen- bowel sounds present, soft, non tender Musculoskeletal- able to move all 4 extremities, generalized weakness, on wheelchair Neurological - alert and oriented to person, place and time Skin- warm and dry, mild erythema to both lower extremities with scratch mark on left noted Psychiatry- normal mood and affect    Labs reviewed: Basic Metabolic  Panel:  Recent Labs  08/18/14 1411  08/20/14 0333  08/23/14 0348 08/23/14 1240 08/24/14 1910 08/24/14 1919  NA  --   < > 139  < > 141 137 138 139  K  --   < > 3.5  < > 5.2* 4.9 4.5 4.5  CL  --   < > 96*  < > 98* 96* 95* 97*  CO2  --   < > 34*  < > 36* 31 32  --   GLUCOSE  --   < > 172*  < > 202* 201* 173* 170*  BUN  --   < > 22*  < > 34* 33* 43* 49*  CREATININE  --   < > 1.56*  < > 1.29* 1.23* 1.39* 1.40*  CALCIUM  --   < > 8.5*  < > 9.2 9.2 9.0  --   MG 1.4*  --  2.2  --  2.0  --   --   --   < > = values in this interval not displayed. Liver Function Tests:  Recent Labs  08/22/14 0420 08/23/14 0348 08/24/14 1910  AST 18 20 23   ALT 12* 13* 13*  ALKPHOS 72 76 84  BILITOT 0.2* 0.4 0.4  PROT 6.8 6.6 7.1  ALBUMIN 3.5 3.4* 3.7   No results for input(s): LIPASE, AMYLASE in the last 8760 hours. No results for input(s): AMMONIA in the last 8760 hours. CBC:  Recent Labs  02/01/14 1730  08/18/14 1115  08/22/14 0420 08/23/14 0348 08/24/14 1910 08/24/14 1919  WBC 15.9*  < > 12.8*  < > 11.3* 10.1 10.0  --   NEUTROABS 12.4*  --  11.0*  --   --   --  6.7  --   HGB 11.9*  < > 10.4*  < > 9.8* 10.2* 10.6* 12.6  HCT 38.1  < > 33.6*  < > 33.2* 33.9* 35.5* 37.0  MCV 99.5  < > 89.6  < > 91.0 90.4 92.0  --   PLT 232  < > 234  < > 235 221 208  --   < > = values in this interval not displayed. Cardiac Enzymes:  Recent Labs  01/03/14 1200 01/09/14 1938 02/01/14 1730  TROPONINI <0.03 <0.03 <0.03   BNP: Invalid input(s): POCBNP CBG:  Recent Labs  08/24/14 0744 08/24/14 1145 08/24/14 2049  GLUCAP 146* 205* 119*    Radiological Exams: Ct Head Wo Contrast  08/24/2014   CLINICAL DATA:  81 year old female with altered mental status. Lethargy.  EXAM: CT HEAD WITHOUT CONTRAST  TECHNIQUE: Contiguous axial images were obtained from the base of the skull through the vertex without intravenous contrast.  COMPARISON:  No priors.  FINDINGS: Mild cerebral atrophy. Patchy and  confluent areas of decreased attenuation are noted throughout the deep and periventricular white matter of the cerebral hemispheres bilaterally, compatible with chronic microvascular ischemic disease. No acute intracranial abnormalities. Specifically, no evidence of acute intracranial hemorrhage, no definite findings of acute/subacute cerebral ischemia, no mass, mass effect, hydrocephalus or abnormal intra or extra-axial fluid collections. Visualized paranasal sinuses and mastoids are generally well pneumatized, with exception of extensive mucosal thickening in the sphenoid sinuses. No acute displaced skull fractures are identified.  IMPRESSION: 1. No acute intracranial abnormalities. 2. Mild cerebral atrophy and chronic microvascular ischemic changes in the cerebral white matter. 3. Extensive mucosal thickening in the sphenoid sinuses, extensive mucosal thickening in sphenoid sinuses. No air-fluid levels to suggest an acute sinusitis at this time.   Electronically Signed   By: Vinnie Langton M.D.   On: 08/24/2014 21:34   Dg Chest Port 1 View  08/24/2014   CLINICAL DATA:  Pt c/o being "tired and cold" x1 day. Pt was released from [facility] this morning after being treated for A.Fib.Pt denies pain and SOB.Hx of HTN, DM, mild aortic stenosis, PNA, bronchitis.Nonsmoker.  EXAM: PORTABLE CHEST - 1 VIEW  COMPARISON:  08/18/2014  FINDINGS: Mild enlargement of the cardiac silhouette. No mediastinal or hilar masses or evidence of adenopathy.  Clear lungs.  No pleural effusion or pneumothorax.  Bony thorax is demineralized but grossly intact.  IMPRESSION: No active disease.   Electronically Signed   By: Lajean Manes M.D.   On: 08/24/2014 19:32    Assessment/Plan  Physical deconditioning Will have her work with physical therapy and occupational therapy team to help with gait training and muscle strengthening exercises.fall precautions. Skin care. Encourage to be out of bed.   Atrial fibrillation S/P  cardioversion and is rate controlled. Continue bisoprolol 7.5 mg daily and xarelto 20 mg daily for now   Diastolic CHF Continue losartan, bisoprolol and lasix for now, daily weight  Leg pruritis With mild leg edema concern of venous stasis causing this. Start atarax 10 mg bid x 2 days, then q12 hr prn itching and reassess. Monitor for skin breakdown  HTN Elevated SBP, monitor bp, continue losartan 25 mg bid with bisoprolol   Klebsiella UTI Asymptomatic, completed her antibiotic course  Diabetic peripheral neuropathy a1c 8.6. Continue metformin and amaryl. Monitor cbg. continue Neurontin 600 mg bid  gerd Stable, continue pantoprazole 40 mg daily and pepcid 20 mg daily  COPD Stable, continue  Symbicort with albuterol and prn duoneb, monitor  Hyperlipidemia Continue Crestor 5 mg daily  Insomnia Continue melatonin 3 mg qhs and monitor   Goals of care: short term rehabilitation   Labs/tests ordered: cbc, bmp  Family/ staff Communication: reviewed care plan with patient and nursing supervisor    Blanchie Serve, MD  [facility] [phone] (Monday-Friday 8 am - 5 pm) [phone] (afterhours)

## 2014-09-08 ENCOUNTER — Telehealth: Payer: Self-pay | Admitting: Cardiology

## 2014-09-08 ENCOUNTER — Non-Acute Institutional Stay (SKILLED_NURSING_FACILITY): Payer: Medicare HMO | Admitting: Adult Health

## 2014-09-08 ENCOUNTER — Encounter: Payer: Self-pay | Admitting: Adult Health

## 2014-09-08 DIAGNOSIS — G4700 Insomnia, unspecified: Secondary | ICD-10-CM | POA: Diagnosis not present

## 2014-09-08 DIAGNOSIS — K219 Gastro-esophageal reflux disease without esophagitis: Secondary | ICD-10-CM | POA: Diagnosis not present

## 2014-09-08 DIAGNOSIS — I5032 Chronic diastolic (congestive) heart failure: Secondary | ICD-10-CM | POA: Diagnosis not present

## 2014-09-08 DIAGNOSIS — I10 Essential (primary) hypertension: Secondary | ICD-10-CM

## 2014-09-08 DIAGNOSIS — J449 Chronic obstructive pulmonary disease, unspecified: Secondary | ICD-10-CM | POA: Diagnosis not present

## 2014-09-08 DIAGNOSIS — R5381 Other malaise: Secondary | ICD-10-CM | POA: Diagnosis not present

## 2014-09-08 DIAGNOSIS — E1140 Type 2 diabetes mellitus with diabetic neuropathy, unspecified: Secondary | ICD-10-CM | POA: Diagnosis not present

## 2014-09-08 DIAGNOSIS — E785 Hyperlipidemia, unspecified: Secondary | ICD-10-CM

## 2014-09-08 DIAGNOSIS — M792 Neuralgia and neuritis, unspecified: Secondary | ICD-10-CM | POA: Diagnosis not present

## 2014-09-08 DIAGNOSIS — I4891 Unspecified atrial fibrillation: Secondary | ICD-10-CM

## 2014-09-08 NOTE — Telephone Encounter (Signed)
New message      Pt had chest xray and ekg taken at [facility].  Results were sent to Dr Mare Ferrari. She is due to be released tomorrow but they think she is not ready.  Did Dr Mare Ferrari get the xray/ekg?  Please call daughter with the results.

## 2014-09-08 NOTE — Telephone Encounter (Signed)
Spoke with patient and ok to talk to daughter Advised daughter  Dr. Mare Ferrari reviewed information, nothing found in seen in EKG or Xray for patient to have to remain in facility

## 2014-09-10 NOTE — Progress Notes (Signed)
Patient ID: [name] [name], female   DOB: Jul 14, 1930, 81 y.o.   MRN: [id number]    DATE:  09/08/14 MRN:  [id number]  BIRTHDAY: 1930/03/29  Facility:  [facility]urs[facility] Home Location:  [place] Room Number: 706-P  LEVEL OF CARE:  SNF [phone])      Contact Information    Name Relation Home Work [place] Daughter   [phone]   [name] [name], [name] [name] [phone]  [phone]   [name] [name] [name] [name] [phone]  [phone]   [name] Other   [phone]       Chief Complaint  Patient presents with  . Discharge Note    Physical deconditioning, A-Fib with RVR,, hypertension, peripheral neuropathy, COPD, diabetes mellitus, GERD, hyperlipidemia, CHF and insomnia     HISTORY OF PRESENT ILLNESS:  An 81 year old female who is for discharge home wi th Home health PT and OT. She has been admitted to [facility] on 08/24/14 from [facility]. She has PMH of mild aortic stenosis, chronic back pain, peripheral neuropathy, GERD, hypertension, history of pneumonia, history of bronchitis, basal cell ca, PAC, heart murmur, type 2 diabetes mellitus and diastolic CHF. Patient was brought to ED due to confusion. EMS found her to be hypoxic and when brought to ED she was  91% O2 sat on room air and has improved to 99% on 2 L/m via [place]. EKG showed rapid A. fib with HR 151. She was started on Cardizem IV and then transitioned to beta blockers PO. She had electrical cardioversion on 8/10 and converted to NSR.  Patient was admitted to this facility for short-term rehabilitation after the patient's recent hospitalization.  Patient has completed SNF rehabilitation and therapy has cleared the patient for discharge.  PAST  rapid A. fibMEDICAL HISTORY:  Past Medical History  Diagnosis Date  . Mild aortic stenosis     a. 10/2010 Echo: Ef 60-65%, no rwma, mild LVH no AS.  Marland Kitchen Chronic back pain   . Peripheral neuropathy   . GERD (gastroesophageal reflux disease)   .  Hypertension   . History of pneumonia   . History of bronchitis   . Basal cell carcinoma     a. 2013.  . Non-cardiac chest pain     LHC w/ nl coronaries and nl EF 60% -12/2013  . PAC (premature atrial contraction)   . Heart murmur   . Type II diabetes mellitus     TYPE 2     CURRENT MEDICATIONS: Reviewed     Medication List       This list is accurate as of: 09/08/14 11:59 PM.  Always use your most recent med list.               acetaminophen 325 MG tablet  Commonly known as:  TYLENOL  Take 2 tablets (650 mg total) by mouth every 6 (six) hours as needed for mild pain (or Fever >/= 101).     AEROCHAMBER MV inhaler  Use as instructed     albuterol 108 (90 BASE) MCG/ACT inhaler  Commonly known as:  PROVENTIL HFA;VENTOLIN HFA  Inhale 1 puff into the lungs every 6 (six) hours as needed for wheezing or shortness of breath.     BIOTIN PO  Take 1 tablet by mouth daily. 1,000 mg daily     bisoprolol 5 MG tablet  Commonly known as:  ZEBETA  Take 1.5 tablets (7.5 mg total) by mouth daily.     budesonide-formoterol 80-4.5 MCG/ACT inhaler  Commonly known as:  SYMBICORT  Inhale 2 puffs into the lungs 2 (two) times daily.     calcium citrate 950 MG tablet  Commonly known as:  CALCITRATE - dosed in mg elemental calcium  Take 200 mg of elemental calcium by mouth daily.     cholecalciferol 1000 UNITS tablet  Commonly known as:  VITAMIN D  Take 1,000 Units by mouth daily.     cyanocobalamin 1000 MCG/ML injection  Commonly known as:  (VITAMIN B-12)  Inject 1 mL (1,000 mcg total) into the muscle once a week.     famotidine 20 MG tablet  Commonly known as:  PEPCID  Take 20 mg by mouth daily.     feeding supplement (ENSURE ENLIVE) Liqd  Take 237 mLs by mouth 2 (two) times daily between meals.     folic acid 1 MG tablet  Commonly known as:  FOLVITE  Take 1 tablet (1 mg total) by mouth daily.     furosemide 40 MG tablet  Commonly known as:  LASIX  Take 40 mg by mouth  daily.     gabapentin 300 MG capsule  Commonly known as:  NEURONTIN  Take 2 capsules (600 mg total) by mouth 2 (two) times daily.     glimepiride 2 MG tablet  Commonly known as:  AMARYL  Take 2 mg by mouth daily with breakfast.     hydrOXYzine 10 MG tablet  Commonly known as:  ATARAX/VISTARIL  Take 10 mg by mouth every 12 (twelve) hours as needed.     ipratropium-albuterol 0.5-2.5 (3) MG/3ML Soln  Commonly known as:  DUONEB  Take 3 mLs by nebulization every 6 (six) hours as needed.     losartan 50 MG tablet  Commonly known as:  COZAAR  Take 50 mg by mouth 2 (two) times daily. 1/2 tablet by mouth daily     Melatonin 3 MG Tabs  Take 3 mg by mouth at bedtime.     metFORMIN 1000 MG tablet  Commonly known as:  GLUCOPHAGE  Take 1 tablet (1,000 mg total) by mouth 2 (two) times daily with a meal.     nitroGLYCERIN 0.4 MG SL tablet  Commonly known as:  NITROSTAT  Place 1 tablet (0.4 mg total) under the tongue every 5 (five) minutes as needed for chest pain.     nystatin 100000 UNIT/GM Powd  Apply topically 2 (two) times daily.     pantoprazole 40 MG tablet  Commonly known as:  PROTONIX  Take 40 mg by mouth every morning.     rivaroxaban 20 MG Tabs tablet  Commonly known as:  XARELTO  Take 1 tablet (20 mg total) by mouth daily with supper.     rosuvastatin 5 MG tablet  Commonly known as:  CRESTOR  Take 5 mg by mouth at bedtime.         Allergies  Allergen Reactions  . Codeine Nausea And Vomiting  . Demerol Other (See Comments)    Drunk feeling"  . Doxycycline Nausea And Vomiting  . Statins Other (See Comments)    "hurt"   . Zetia [Ezetimibe] Other (See Comments)    hurt     REVIEW OF SYSTEMS:  GENERAL: no change in appetite, no fatigue, no weight changes, no fever, chills or weakness EYES: Denies change in vision, dry eyes, eye pain, itching or discharge EARS: Denies change in hearing, ringing in ears, or earache NOSE: Denies nasal congestion or  epistaxis MOUTH and THROAT: Denies oral discomfort, gingival  pain or bleeding, pain from teeth or hoarseness   RESPIRATORY: no cough, SOB, DOE, wheezing, hemoptysis CARDIAC: no chest pain,  or palpitations GI: no abdominal pain, diarrhea, constipation, heart burn, nausea or vomiting GU: Denies dysuria, frequency, hematuria, incontinence, or discharge PSYCHIATRIC: Denies feeling of depression or anxiety. No report of hallucinations, paranoia, or agitation, +insomnia   PHYSICAL EXAMINATION  GENERAL APPEARANCE: Well nourished. In no acute distress. Obese HEAD: Normal in size and contour. No evidence of trauma EYES: Lids open and close normally. No blepharitis, entropion or ectropion. PERRL. Conjunctivae are clear and sclerae are white. Lenses are without opacity EARS: Pinnae are normal. Patient hears normal voice tunes of the examiner MOUTH and THROAT: Lips are without lesions. Oral mucosa is moist and without lesions. Tongue is normal in shape, size, and color and without lesions NECK: supple, trachea midline, no neck masses, no thyroid tenderness, no thyromegaly LYMPHATICS: no LAN in the neck, no supraclavicular LAN RESPIRATORY: breathing is even & unlabored, BS CTAB CARDIAC: irregular heart rate, no murmur,no extra heart sounds, BLE edema 2+ GI: abdomen soft, normal BS, no masses, no tenderness, no hepatomegaly, no splenomegaly PSYCHIATRIC: Alert and oriented X 3. Affect and behavior are appropriate  LABS/RADIOLOGY: Labs reviewed: Basic Metabolic Panel:  Recent Labs  08/18/14 1411  08/20/14 0333  08/23/14 0348 08/23/14 1240 08/24/14 1910 08/24/14 1919  NA  --   < > 139  < > 141 137 138 139  K  --   < > 3.5  < > 5.2* 4.9 4.5 4.5  CL  --   < > 96*  < > 98* 96* 95* 97*  CO2  --   < > 34*  < > 36* 31 32  --   GLUCOSE  --   < > 172*  < > 202* 201* 173* 170*  BUN  --   < > 22*  < > 34* 33* 43* 49*  CREATININE  --   < > 1.56*  < > 1.29* 1.23* 1.39* 1.40*  CALCIUM  --   < > 8.5*  <  > 9.2 9.2 9.0  --   MG 1.4*  --  2.2  --  2.0  --   --   --   < > = values in this interval not displayed. Liver Function Tests:  Recent Labs  08/22/14 0420 08/23/14 0348 08/24/14 1910  AST 18 20 23   ALT 12* 13* 13*  ALKPHOS 72 76 84  BILITOT 0.2* 0.4 0.4  PROT 6.8 6.6 7.1  ALBUMIN 3.5 3.4* 3.7   CBC:  Recent Labs  02/01/14 1730  08/18/14 1115  08/22/14 0420 08/23/14 0348 08/24/14 1910 08/24/14 1919  WBC 15.9*  < > 12.8*  < > 11.3* 10.1 10.0  --   NEUTROABS 12.4*  --  11.0*  --   --   --  6.7  --   HGB 11.9*  < > 10.4*  < > 9.8* 10.2* 10.6* 12.6  HCT 38.1  < > 33.6*  < > 33.2* 33.9* 35.5* 37.0  MCV 99.5  < > 89.6  < > 91.0 90.4 92.0  --   PLT 232  < > 234  < > 235 221 208  --   < > = values in this interval not displayed.  Cardiac Enzymes:  Recent Labs  01/03/14 1200 01/09/14 1938 02/01/14 1730  TROPONINI <0.03 <0.03 <0.03   CBG:  Recent Labs  08/24/14 0744 08/24/14 1145 08/24/14 2049  GLUCAP 146* 205*  119*    Ct Head Wo Contrast  08/24/2014   CLINICAL DATA:  81 year old female with altered mental status. Lethargy.  EXAM: CT HEAD WITHOUT CONTRAST  TECHNIQUE: Contiguous axial images were obtained from the base of the skull through the vertex without intravenous contrast.  COMPARISON:  No priors.  FINDINGS: Mild cerebral atrophy. Patchy and confluent areas of decreased attenuation are noted throughout the deep and periventricular white matter of the cerebral hemispheres bilaterally, compatible with chronic microvascular ischemic disease. No acute intracranial abnormalities. Specifically, no evidence of acute intracranial hemorrhage, no definite findings of acute/subacute cerebral ischemia, no mass, mass effect, hydrocephalus or abnormal intra or extra-axial fluid collections. Visualized paranasal sinuses and mastoids are generally well pneumatized, with exception of extensive mucosal thickening in the sphenoid sinuses. No acute displaced skull fractures are  identified.  IMPRESSION: 1. No acute intracranial abnormalities. 2. Mild cerebral atrophy and chronic microvascular ischemic changes in the cerebral white matter. 3. Extensive mucosal thickening in the sphenoid sinuses, extensive mucosal thickening in sphenoid sinuses. No air-fluid levels to suggest an acute sinusitis at this time.   Electronically Signed   By: Vinnie Langton M.D.   On: 08/24/2014 21:34   Mr Brain Wo Contrast  08/21/2014   CLINICAL DATA:  Mental status changes. Rapid atrial fibrillation. Confusion. Memory loss.  EXAM: MRI HEAD WITHOUT CONTRAST  TECHNIQUE: Multiplanar, multiecho pulse sequences of the brain and surrounding structures were obtained without intravenous contrast.  COMPARISON:  None.  FINDINGS: The diffusion-weighted images demonstrate no evidence for acute or subacute infarction.  The study is moderately degraded by patient motion. Moderate atrophy and periventricular white matter change bilaterally is somewhat advanced for age. No acute infarct, hemorrhage, or mass lesion is present. The ventricles are of normal size. No significant extra-axial fluid collections are present.  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 White matter changes extend into the brainstem.  Flow is present in the major intracranial arteries. Bilateral lens replacements are present. Circumferential mucosal thickening is present in the posterior sphenoid sinus bilaterally. The remaining paranasal sinuses and the mastoid air cells are clear.  Midline structures are within normal limits.  IMPRESSION: 1. No acute intracranial abnormality. 2. Age advanced atrophy and white matter disease. This likely reflects the sequela of chronic microvascular ischemia.   Electronically Signed   By: San Morelle M.D.   On: 08/21/2014 14:28   US Renal  08/21/2014   CLINICAL DATA:  81 year old female with acute renal injury. Initial encounter.  EXAM: RENAL / URINARY TRACT ULTRASOUND COMPLETE  COMPARISON:   Lumbar spine MRI 01/11/2013.  Chest CTA 02/01/2014.  FINDINGS: Right Kidney:  Length: 10.6 cm. No hydronephrosis. Cortical echogenicity within normal limits. No right renal mass.  Left Kidney:  Length: 10.1 cm. No hydronephrosis. Cortical echotexture within normal limits. No left renal mass.  Bladder:  Decompressed, Foley catheter balloon visible.  IMPRESSION: No hydronephrosis or acute renal findings.   Electronically Signed   By: Genevie Ann M.D.   On: 08/21/2014 16:13   Dg Chest Port 1 View  08/24/2014   CLINICAL DATA:  Pt c/o being "tired and cold" x1 day. Pt was [facility] after being treated for A.Fib.Pt denies pain and SOB.Hx of HTN, DM, mild aortic stenosis, PNA, bronchitis.Nonsmoker.  EXAM: PORTABLE CHEST - 1 VIEW  COMPARISON:  08/18/2014  FINDINGS: Mild enlargement of the cardiac silhouette. No mediastinal or hilar masses or evidence of adenopathy.  Clear lungs.  No pleural effusion  or pneumothorax.  Bony thorax is demineralized but grossly intact.  IMPRESSION: No active disease.   Electronically Signed   By: Lajean Manes M.D.   On: 08/24/2014 19:32   Dg Chest Port 1 View  08/18/2014   CLINICAL DATA:  Shortness of breath today with exertion.  EXAM: PORTABLE CHEST - 1 VIEW  COMPARISON:  03/31/2014  FINDINGS: Heart is borderline in size. No confluent airspace opacities, effusions or edema. No acute bony abnormality.  IMPRESSION: No active disease.   Electronically Signed   By: Rolm Baptise M.D.   On: 08/18/2014 11:39    ASSESSMENT/PLAN:  Physical deconditioning - for Home health PT and OT  Atrial fibrillation with RVR  -  S/P cardioversion; rate controlled; continue Xarelto 20 mg 1 tab by mouth every supper and Bisoprolol 5 mg take 1 1/2 tab =7.5 mg daily  Diabetic peripheral neuropathy - continue Neurontin 300 mg take 2 capsules = 600 mg by mouth twice a day    COPD, Gold II - continue Symbicort 18-4.5 g/ACT 2 puffs in to lungs twice a day, albuterol 108 g/ACT 1  puff into the lungs Q 6 hours PRN, Duoneb 37ml via neb Q 6 hrs PRN; follow-up with Dr Melvyn Novas; pulmonologist  Diabetes mellitus, type 2 - hgbA1c 8.6; continue Metformin 1000 mg PO BID and Amaryl 2 mg 1 tab PO daily  GERD - continue Pepcid 20 mg daily and Pantoprazole 40 mg PO Q HS  Hyperlipidemia -  Continue Crestor 5 mg 1 tab PO Q HS  Diastolic CHF - increase Lasix 40 mg 1 tab PO Q D, check BMP in 1 week  Essential hypertension - continue Losartan 25 mg 1 tab PO BID and Zebeta 5 mg take 1 1/2 tab = 7.5 mg daily  Insomnia - continue Melatonin 3 mg 1 tab PO Q HS     I have filled out patient's discharge paperwork and written prescriptions.  Patient will receive home health PT and OT.  Total discharge time: Less than 30 minutes  Discharge time involved coordination of the discharge process with Education officer, museum, nursing staff and therapy department. Medical justification for home health services verified.   [facility], NP [employer] [phone]

## 2014-09-11 ENCOUNTER — Inpatient Hospital Stay (HOSPITAL_COMMUNITY): Payer: Medicare HMO

## 2014-09-11 ENCOUNTER — Emergency Department (HOSPITAL_COMMUNITY): Payer: Medicare HMO

## 2014-09-11 ENCOUNTER — Inpatient Hospital Stay (HOSPITAL_COMMUNITY)
Admission: EM | Admit: 2014-09-11 | Discharge: 2014-09-15 | DRG: 682 | Disposition: A | Discharge status: Hospice | Payer: Medicare HMO | Attending: Internal Medicine | Admitting: Internal Medicine

## 2014-09-11 ENCOUNTER — Encounter (HOSPITAL_COMMUNITY): Payer: Self-pay | Admitting: Emergency Medicine

## 2014-09-11 DIAGNOSIS — I491 Atrial premature depolarization: Secondary | ICD-10-CM | POA: Diagnosis present

## 2014-09-11 DIAGNOSIS — I4891 Unspecified atrial fibrillation: Secondary | ICD-10-CM | POA: Diagnosis not present

## 2014-09-11 DIAGNOSIS — H1130 Conjunctival hemorrhage, unspecified eye: Secondary | ICD-10-CM | POA: Insufficient documentation

## 2014-09-11 DIAGNOSIS — Z6841 Body Mass Index (BMI) 40.0 and over, adult: Secondary | ICD-10-CM

## 2014-09-11 DIAGNOSIS — Z66 Do not resuscitate: Secondary | ICD-10-CM | POA: Diagnosis present

## 2014-09-11 DIAGNOSIS — K219 Gastro-esophageal reflux disease without esophagitis: Secondary | ICD-10-CM | POA: Diagnosis present

## 2014-09-11 DIAGNOSIS — Z22322 Carrier or suspected carrier of Methicillin resistant Staphylococcus aureus: Secondary | ICD-10-CM

## 2014-09-11 DIAGNOSIS — M545 Low back pain: Secondary | ICD-10-CM | POA: Diagnosis present

## 2014-09-11 DIAGNOSIS — S0083XA Contusion of other part of head, initial encounter: Secondary | ICD-10-CM

## 2014-09-11 DIAGNOSIS — E7800 Pure hypercholesterolemia, unspecified: Secondary | ICD-10-CM | POA: Diagnosis present

## 2014-09-11 DIAGNOSIS — E119 Type 2 diabetes mellitus without complications: Secondary | ICD-10-CM

## 2014-09-11 DIAGNOSIS — B372 Candidiasis of skin and nail: Secondary | ICD-10-CM | POA: Diagnosis present

## 2014-09-11 DIAGNOSIS — Z87891 Personal history of nicotine dependence: Secondary | ICD-10-CM

## 2014-09-11 DIAGNOSIS — I119 Hypertensive heart disease without heart failure: Secondary | ICD-10-CM | POA: Diagnosis present

## 2014-09-11 DIAGNOSIS — I130 Hypertensive heart and chronic kidney disease with heart failure and stage 1 through stage 4 chronic kidney disease, or unspecified chronic kidney disease: Secondary | ICD-10-CM | POA: Diagnosis present

## 2014-09-11 DIAGNOSIS — N184 Chronic kidney disease, stage 4 (severe): Secondary | ICD-10-CM | POA: Diagnosis present

## 2014-09-11 DIAGNOSIS — E875 Hyperkalemia: Secondary | ICD-10-CM | POA: Insufficient documentation

## 2014-09-11 DIAGNOSIS — I350 Nonrheumatic aortic (valve) stenosis: Secondary | ICD-10-CM

## 2014-09-11 DIAGNOSIS — Z885 Allergy status to narcotic agent status: Secondary | ICD-10-CM

## 2014-09-11 DIAGNOSIS — M546 Pain in thoracic spine: Secondary | ICD-10-CM | POA: Diagnosis not present

## 2014-09-11 DIAGNOSIS — W06XXXA Fall from bed, initial encounter: Secondary | ICD-10-CM | POA: Diagnosis present

## 2014-09-11 DIAGNOSIS — H1131 Conjunctival hemorrhage, right eye: Secondary | ICD-10-CM | POA: Diagnosis present

## 2014-09-11 DIAGNOSIS — S0531XA Ocular laceration without prolapse or loss of intraocular tissue, right eye, initial encounter: Secondary | ICD-10-CM | POA: Diagnosis present

## 2014-09-11 DIAGNOSIS — I5033 Acute on chronic diastolic (congestive) heart failure: Secondary | ICD-10-CM

## 2014-09-11 DIAGNOSIS — E1142 Type 2 diabetes mellitus with diabetic polyneuropathy: Secondary | ICD-10-CM | POA: Diagnosis present

## 2014-09-11 DIAGNOSIS — Z515 Encounter for palliative care: Secondary | ICD-10-CM | POA: Insufficient documentation

## 2014-09-11 DIAGNOSIS — I5032 Chronic diastolic (congestive) heart failure: Secondary | ICD-10-CM | POA: Insufficient documentation

## 2014-09-11 DIAGNOSIS — J449 Chronic obstructive pulmonary disease, unspecified: Secondary | ICD-10-CM | POA: Diagnosis present

## 2014-09-11 DIAGNOSIS — Z96652 Presence of left artificial knee joint: Secondary | ICD-10-CM | POA: Diagnosis present

## 2014-09-11 DIAGNOSIS — G8929 Other chronic pain: Secondary | ICD-10-CM | POA: Diagnosis present

## 2014-09-11 DIAGNOSIS — M549 Dorsalgia, unspecified: Secondary | ICD-10-CM

## 2014-09-11 DIAGNOSIS — Z85828 Personal history of other malignant neoplasm of skin: Secondary | ICD-10-CM

## 2014-09-11 DIAGNOSIS — W19XXXA Unspecified fall, initial encounter: Secondary | ICD-10-CM | POA: Diagnosis present

## 2014-09-11 DIAGNOSIS — Z7901 Long term (current) use of anticoagulants: Secondary | ICD-10-CM

## 2014-09-11 DIAGNOSIS — N179 Acute kidney failure, unspecified: Secondary | ICD-10-CM | POA: Diagnosis present

## 2014-09-11 DIAGNOSIS — N183 Chronic kidney disease, stage 3 (moderate): Secondary | ICD-10-CM

## 2014-09-11 DIAGNOSIS — M793 Panniculitis, unspecified: Secondary | ICD-10-CM | POA: Diagnosis present

## 2014-09-11 DIAGNOSIS — I480 Paroxysmal atrial fibrillation: Secondary | ICD-10-CM | POA: Diagnosis present

## 2014-09-11 DIAGNOSIS — R34 Anuria and oliguria: Secondary | ICD-10-CM | POA: Diagnosis present

## 2014-09-11 DIAGNOSIS — Z79899 Other long term (current) drug therapy: Secondary | ICD-10-CM

## 2014-09-11 DIAGNOSIS — G9340 Encephalopathy, unspecified: Secondary | ICD-10-CM | POA: Diagnosis not present

## 2014-09-11 DIAGNOSIS — E860 Dehydration: Secondary | ICD-10-CM | POA: Diagnosis present

## 2014-09-11 DIAGNOSIS — M79606 Pain in leg, unspecified: Secondary | ICD-10-CM

## 2014-09-11 DIAGNOSIS — Z888 Allergy status to other drugs, medicaments and biological substances status: Secondary | ICD-10-CM | POA: Diagnosis not present

## 2014-09-11 DIAGNOSIS — I509 Heart failure, unspecified: Secondary | ICD-10-CM

## 2014-09-11 HISTORY — DX: Morbid (severe) obesity due to excess calories: E66.01

## 2014-09-11 HISTORY — DX: Chronic diastolic (congestive) heart failure: I50.32

## 2014-09-11 HISTORY — DX: Unspecified atrial fibrillation: I48.91

## 2014-09-11 HISTORY — DX: Essential (primary) hypertension: I10

## 2014-09-11 LAB — CBC WITH DIFFERENTIAL/PLATELET
Basophils Absolute: 0 10*3/uL (ref 0.0–0.1)
Basophils Relative: 0 % (ref 0–1)
Eosinophils Absolute: 0.2 10*3/uL (ref 0.0–0.7)
Eosinophils Relative: 1 % (ref 0–5)
HCT: 32 % — ABNORMAL LOW (ref 36.0–46.0)
HEMOGLOBIN: 9.6 g/dL — AB (ref 12.0–15.0)
Lymphocytes Relative: 12 % (ref 12–46)
Lymphs Abs: 1.5 10*3/uL (ref 0.7–4.0)
MCH: 26.7 pg (ref 26.0–34.0)
MCHC: 30 g/dL (ref 30.0–36.0)
MCV: 88.9 fL (ref 78.0–100.0)
Monocytes Absolute: 1 10*3/uL (ref 0.1–1.0)
Monocytes Relative: 8 % (ref 3–12)
Neutro Abs: 10.2 10*3/uL — ABNORMAL HIGH (ref 1.7–7.7)
Neutrophils Relative %: 79 % — ABNORMAL HIGH (ref 43–77)
Platelets: 296 10*3/uL (ref 150–400)
RBC: 3.6 MIL/uL — AB (ref 3.87–5.11)
RDW: 15.6 % — ABNORMAL HIGH (ref 11.5–15.5)
WBC: 12.8 10*3/uL — AB (ref 4.0–10.5)

## 2014-09-11 LAB — BASIC METABOLIC PANEL
ANION GAP: 8 (ref 5–15)
Anion gap: 8 (ref 5–15)
Anion gap: 7 (ref 5–15)
Anion gap: 9 (ref 5–15)
BUN: 85 mg/dL — AB (ref 6–20)
BUN: 82 mg/dL — ABNORMAL HIGH (ref 6–20)
BUN: 82 mg/dL — AB (ref 6–20)
BUN: 79 mg/dL — ABNORMAL HIGH (ref 6–20)
CHLORIDE: 104 mmol/L (ref 101–111)
CHLORIDE: 103 mmol/L (ref 101–111)
CO2: 25 mmol/L (ref 22–32)
CO2: 26 mmol/L (ref 22–32)
CO2: 27 mmol/L (ref 22–32)
CO2: 26 mmol/L (ref 22–32)
CREATININE: 1.87 mg/dL — AB (ref 0.44–1.00)
Calcium: 8.9 mg/dL (ref 8.9–10.3)
Calcium: 8.8 mg/dL — ABNORMAL LOW (ref 8.9–10.3)
Calcium: 8.8 mg/dL — ABNORMAL LOW (ref 8.9–10.3)
Calcium: 8.7 mg/dL — ABNORMAL LOW (ref 8.9–10.3)
Chloride: 102 mmol/L (ref 101–111)
Chloride: 100 mmol/L — ABNORMAL LOW (ref 101–111)
Creatinine, Ser: 1.85 mg/dL — ABNORMAL HIGH (ref 0.44–1.00)
Creatinine, Ser: 1.91 mg/dL — ABNORMAL HIGH (ref 0.44–1.00)
Creatinine, Ser: 2.01 mg/dL — ABNORMAL HIGH (ref 0.44–1.00)
GFR calc Af Amer: 28 mL/min — ABNORMAL LOW (ref >60)
GFR calc Af Amer: 25 mL/min — ABNORMAL LOW (ref >60)
GFR calc non Af Amer: 24 mL/min — ABNORMAL LOW (ref >60)
GFR calc non Af Amer: 24 mL/min — ABNORMAL LOW (ref >60)
GFR calc non Af Amer: 22 mL/min — ABNORMAL LOW (ref >60)
GFR, EST AFRICAN AMERICAN: 28 mL/min — AB (ref >60)
GFR, EST AFRICAN AMERICAN: 27 mL/min — AB (ref >60)
GFR, EST NON AFRICAN AMERICAN: 23 mL/min — AB (ref >60)
GLUCOSE: 156 mg/dL — AB (ref 65–99)
Glucose, Bld: 113 mg/dL — ABNORMAL HIGH (ref 65–99)
Glucose, Bld: 165 mg/dL — ABNORMAL HIGH (ref 65–99)
Glucose, Bld: 178 mg/dL — ABNORMAL HIGH (ref 65–99)
POTASSIUM: 5.8 mmol/L — AB (ref 3.5–5.1)
POTASSIUM: 6.1 mmol/L — AB (ref 3.5–5.1)
Potassium: 5.7 mmol/L — ABNORMAL HIGH (ref 3.5–5.1)
Potassium: 6.2 mmol/L (ref 3.5–5.1)
SODIUM: 135 mmol/L (ref 135–145)
SODIUM: 138 mmol/L (ref 135–145)
Sodium: 137 mmol/L (ref 135–145)
Sodium: 135 mmol/L (ref 135–145)

## 2014-09-11 LAB — TSH: TSH: 1.746 u[IU]/mL (ref 0.350–4.500)

## 2014-09-11 LAB — CBG MONITORING, ED: GLUCOSE-CAPILLARY: 91 mg/dL (ref 65–99)

## 2014-09-11 LAB — MRSA PCR SCREENING: MRSA BY PCR: POSITIVE — AB

## 2014-09-11 LAB — TROPONIN I
TROPONIN I: 0.03 ng/mL (ref <0.031)
Troponin I: 0.03 ng/mL (ref <0.031)

## 2014-09-11 LAB — GLUCOSE, CAPILLARY
GLUCOSE-CAPILLARY: 134 mg/dL — AB (ref 65–99)
GLUCOSE-CAPILLARY: 171 mg/dL — AB (ref 65–99)

## 2014-09-11 MED ORDER — NITROGLYCERIN 0.4 MG SL SUBL: 0.4000 mg | SUBLINGUAL_TABLET | SUBLINGUAL | Status: DC | PRN

## 2014-09-11 MED ORDER — SODIUM POLYSTYRENE SULFONATE 15 GM/60ML PO SUSP
30.0000 g | Freq: Once | ORAL | Status: AC
  Administered 2014-09-11: 30 g via ORAL
  Filled 2014-09-11: qty 120

## 2014-09-11 MED ORDER — BISOPROLOL FUMARATE 5 MG PO TABS
7.5000 mg | ORAL_TABLET | Freq: Every day | ORAL | Status: DC
  Administered 2014-09-12 (×2): 7.5 mg via ORAL
  Filled 2014-09-11 (×4): qty 2

## 2014-09-11 MED ORDER — FAMOTIDINE 20 MG PO TABS
20.0000 mg | ORAL_TABLET | Freq: Every day | ORAL | Status: DC
  Administered 2014-09-12 – 2014-09-13 (×3): 20 mg via ORAL
  Filled 2014-09-11 (×4): qty 1

## 2014-09-11 MED ORDER — AMIODARONE LOAD VIA INFUSION
150.0000 mg | Freq: Once | INTRAVENOUS | Status: DC
  Filled 2014-09-11: qty 83.34

## 2014-09-11 MED ORDER — ENSURE ENLIVE PO LIQD
237.0000 mL | Freq: Two times a day (BID) | ORAL | Status: DC
  Administered 2014-09-12 – 2014-09-13 (×3): 237 mL via ORAL

## 2014-09-11 MED ORDER — ACETAMINOPHEN 325 MG PO TABS
650.0000 mg | ORAL_TABLET | Freq: Four times a day (QID) | ORAL | Status: DC | PRN
  Administered 2014-09-13: 650 mg via ORAL
  Filled 2014-09-11: qty 2

## 2014-09-11 MED ORDER — FOLIC ACID 1 MG PO TABS
1.0000 mg | ORAL_TABLET | Freq: Every day | ORAL | Status: DC
  Administered 2014-09-12 – 2014-09-13 (×3): 1 mg via ORAL
  Filled 2014-09-11 (×4): qty 1

## 2014-09-11 MED ORDER — GABAPENTIN 300 MG PO CAPS
300.0000 mg | ORAL_CAPSULE | Freq: Two times a day (BID) | ORAL | Status: DC
  Administered 2014-09-11 – 2014-09-13 (×5): 300 mg via ORAL
  Filled 2014-09-11 (×6): qty 1

## 2014-09-11 MED ORDER — ONDANSETRON HCL 4 MG PO TABS: 4.0000 mg | ORAL_TABLET | Freq: Four times a day (QID) | ORAL | Status: DC | PRN

## 2014-09-11 MED ORDER — FAMOTIDINE IN NACL 20-0.9 MG/50ML-% IV SOLN
20.0000 mg | Freq: Once | INTRAVENOUS | Status: DC
  Filled 2014-09-11: qty 50

## 2014-09-11 MED ORDER — INSULIN ASPART 100 UNIT/ML ~~LOC~~ SOLN
0.0000 [IU] | Freq: Three times a day (TID) | SUBCUTANEOUS | Status: DC
  Administered 2014-09-12: 3 [IU] via SUBCUTANEOUS
  Administered 2014-09-12 (×2): 2 [IU] via SUBCUTANEOUS
  Administered 2014-09-13 (×2): 1 [IU] via SUBCUTANEOUS
  Administered 2014-09-13 – 2014-09-14 (×2): 2 [IU] via SUBCUTANEOUS

## 2014-09-11 MED ORDER — MELATONIN 3 MG PO TABS: 3.0000 mg | ORAL_TABLET | Freq: Every day | ORAL | Status: DC

## 2014-09-11 MED ORDER — ACETAMINOPHEN 325 MG PO TABS
650.0000 mg | ORAL_TABLET | Freq: Four times a day (QID) | ORAL | Status: DC | PRN
Start: 2014-09-11 — End: 2014-09-11

## 2014-09-11 MED ORDER — SENNOSIDES-DOCUSATE SODIUM 8.6-50 MG PO TABS: 1.0000 | ORAL_TABLET | Freq: Every evening | ORAL | Status: DC | PRN

## 2014-09-11 MED ORDER — SODIUM CHLORIDE 0.9 % IV SOLN: 250.0000 mL | INTRAVENOUS | Status: DC | PRN

## 2014-09-11 MED ORDER — SODIUM CHLORIDE 0.9 % IV BOLUS (SEPSIS)
500.0000 mL | Freq: Once | INTRAVENOUS | Status: AC
  Administered 2014-09-11: 500 mL via INTRAVENOUS

## 2014-09-11 MED ORDER — DIPHENHYDRAMINE HCL 25 MG PO CAPS
25.0000 mg | ORAL_CAPSULE | Freq: Once | ORAL | Status: DC
  Filled 2014-09-11: qty 1

## 2014-09-11 MED ORDER — ONDANSETRON HCL 4 MG/2ML IJ SOLN
4.0000 mg | Freq: Four times a day (QID) | INTRAMUSCULAR | Status: DC | PRN
  Administered 2014-09-11 – 2014-09-14 (×3): 4 mg via INTRAVENOUS
  Filled 2014-09-11 (×3): qty 2

## 2014-09-11 MED ORDER — HEPARIN (PORCINE) IN NACL 100-0.45 UNIT/ML-% IJ SOLN
1000.0000 [IU]/h | INTRAMUSCULAR | Status: DC
  Administered 2014-09-11: 1100 [IU]/h via INTRAVENOUS
  Administered 2014-09-12: 1000 [IU]/h via INTRAVENOUS
  Filled 2014-09-11 (×2): qty 250

## 2014-09-11 MED ORDER — SODIUM CHLORIDE 0.9 % IJ SOLN: 3.0000 mL | INTRAMUSCULAR | Status: DC | PRN

## 2014-09-11 MED ORDER — PANTOPRAZOLE SODIUM 40 MG PO TBEC
40.0000 mg | DELAYED_RELEASE_TABLET | Freq: Every morning | ORAL | Status: DC
  Administered 2014-09-12 – 2014-09-13 (×3): 40 mg via ORAL
  Filled 2014-09-11 (×4): qty 1

## 2014-09-11 MED ORDER — IPRATROPIUM-ALBUTEROL 0.5-2.5 (3) MG/3ML IN SOLN: 3.0000 mL | Freq: Four times a day (QID) | RESPIRATORY_TRACT | Status: DC | PRN

## 2014-09-11 MED ORDER — AMIODARONE HCL IN DEXTROSE 360-4.14 MG/200ML-% IV SOLN
60.0000 mg/h | INTRAVENOUS | Status: DC
  Administered 2014-09-11: 60 mg/h via INTRAVENOUS
  Filled 2014-09-11: qty 200

## 2014-09-11 MED ORDER — SODIUM CHLORIDE 0.9 % IJ SOLN
3.0000 mL | Freq: Two times a day (BID) | INTRAMUSCULAR | Status: DC
  Administered 2014-09-12 – 2014-09-14 (×6): 3 mL via INTRAVENOUS

## 2014-09-11 MED ORDER — ERYTHROMYCIN 5 MG/GM OP OINT
TOPICAL_OINTMENT | Freq: Three times a day (TID) | OPHTHALMIC | Status: DC
  Administered 2014-09-12 (×3): via OPHTHALMIC
  Administered 2014-09-12: 1 via OPHTHALMIC
  Administered 2014-09-13: 11:00:00 via OPHTHALMIC
  Administered 2014-09-13: 1 via OPHTHALMIC
  Administered 2014-09-13 – 2014-09-14 (×3): via OPHTHALMIC
  Filled 2014-09-11: qty 3.5

## 2014-09-11 MED ORDER — CALCIUM CITRATE 950 (200 CA) MG PO TABS
200.0000 mg | ORAL_TABLET | Freq: Every day | ORAL | Status: DC
  Administered 2014-09-12 – 2014-09-13 (×3): 200 mg via ORAL
  Filled 2014-09-11 (×4): qty 1

## 2014-09-11 MED ORDER — AMIODARONE HCL IN DEXTROSE 360-4.14 MG/200ML-% IV SOLN
30.0000 mg/h | INTRAVENOUS | Status: DC
  Administered 2014-09-12 (×3): 30 mg/h via INTRAVENOUS
  Administered 2014-09-12: 59.94 mg/h via INTRAVENOUS
  Administered 2014-09-13 – 2014-09-14 (×2): 30 mg/h via INTRAVENOUS
  Filled 2014-09-11 (×12): qty 200

## 2014-09-11 MED ORDER — BUDESONIDE-FORMOTEROL FUMARATE 80-4.5 MCG/ACT IN AERO
2.0000 | INHALATION_SPRAY | Freq: Two times a day (BID) | RESPIRATORY_TRACT | Status: DC
  Administered 2014-09-12 – 2014-09-13 (×4): 2 via RESPIRATORY_TRACT
  Filled 2014-09-11: qty 6.9

## 2014-09-11 MED ORDER — FUROSEMIDE 10 MG/ML IJ SOLN
40.0000 mg | Freq: Once | INTRAMUSCULAR | Status: AC
  Administered 2014-09-11: 40 mg via INTRAVENOUS
  Filled 2014-09-11: qty 4

## 2014-09-11 MED ORDER — NYSTATIN 100000 UNIT/GM EX CREA
TOPICAL_CREAM | Freq: Two times a day (BID) | CUTANEOUS | Status: DC
  Administered 2014-09-12 – 2014-09-15 (×8): via TOPICAL
  Filled 2014-09-11: qty 15

## 2014-09-11 MED ORDER — CYANOCOBALAMIN 1000 MCG/ML IJ SOLN
1000.0000 ug | INTRAMUSCULAR | Status: DC
  Administered 2014-09-12: 1000 ug via INTRAMUSCULAR
  Filled 2014-09-11: qty 1

## 2014-09-11 MED ORDER — INSULIN GLARGINE 100 UNIT/ML ~~LOC~~ SOLN
10.0000 [IU] | Freq: Every day | SUBCUTANEOUS | Status: DC
Start: 2014-09-11 — End: 2014-09-14
  Administered 2014-09-12 – 2014-09-13 (×3): 10 [IU] via SUBCUTANEOUS
  Filled 2014-09-11 (×4): qty 0.1

## 2014-09-11 MED ORDER — ACETAMINOPHEN 650 MG RE SUPP: 650.0000 mg | Freq: Four times a day (QID) | RECTAL | Status: DC | PRN

## 2014-09-11 MED ORDER — HYDROCODONE-ACETAMINOPHEN 5-325 MG PO TABS
1.0000 | ORAL_TABLET | ORAL | Status: DC | PRN
  Administered 2014-09-11 – 2014-09-13 (×5): 1 via ORAL
  Filled 2014-09-11 (×5): qty 1

## 2014-09-11 MED ORDER — METHYLPREDNISOLONE SODIUM SUCC 125 MG IJ SOLR
125.0000 mg | Freq: Once | INTRAMUSCULAR | Status: DC
  Filled 2014-09-11: qty 2

## 2014-09-11 MED ORDER — SODIUM CHLORIDE 0.9 % IJ SOLN: 3.0000 mL | Freq: Two times a day (BID) | INTRAMUSCULAR | Status: DC

## 2014-09-11 MED ORDER — ALBUTEROL SULFATE (2.5 MG/3ML) 0.083% IN NEBU: 2.5000 mg | INHALATION_SOLUTION | Freq: Four times a day (QID) | RESPIRATORY_TRACT | Status: DC | PRN

## 2014-09-11 NOTE — ED Notes (Signed)
Patient coming from home after falling out of bed on to either a night stand or on to her walker.  Patient has extensive swelling and bruising to the right eye, cheek and jaw.  Patient denies LOC.  Patient is on blood thinner.  Hx of A. Fib.

## 2014-09-11 NOTE — Progress Notes (Signed)
ANTICOAGULATION CONSULT NOTE - Initial Consult  Pharmacy Consult for Heparin Indication: atrial fibrillation  Allergies  Allergen Reactions  . Codeine Nausea And Vomiting  . Demerol Other (See Comments)    Drunk feeling"  . Doxycycline Nausea And Vomiting  . Statins Other (See Comments)    "hurt"   . Zetia [Ezetimibe] Other (See Comments)    hurt    Patient Measurements: Height: 5\' 3"  (160 cm) Weight: 234 lb 2.1 oz (106.2 kg) IBW/kg (Calculated) : 52.4 Heparin Dosing Weight: 81 kg  Vital Signs: Temp: 97.8 F (36.6 C) (08/29 1359) Temp Source: Oral (08/29 1359) BP: 99/60 mmHg (08/29 1359) Pulse Rate: 123 (08/29 1359)  Labs:  Recent Labs  09/11/14 0825 09/11/14 1304  HGB 9.6*  --   HCT 32.0*  --   PLT 296  --   CREATININE 1.85* 1.87*    Estimated Creatinine Clearance: 27.1 mL/min (by C-G formula based on Cr of 1.87).   Medical History: Past Medical History  Diagnosis Date  . Mild aortic stenosis     a. 10/2010 Echo: Ef 60-65%, no rwma, mild LVH no AS;  b. 08/2014 Echo: mild to mod AS.  Marland Kitchen Chronic back pain   . Peripheral neuropathy   . GERD (gastroesophageal reflux disease)   . Essential hypertension   . History of pneumonia   . History of bronchitis   . Basal cell carcinoma     a. 2013.  . Non-cardiac chest pain     a. 12/2013 LHC w/ nl coronaries and nl EF 60%.  . Frequent PAC's   . Heart murmur   . Type II diabetes mellitus   . Atrial fibrillation with RVR     a. 08/2014 s/p TEE/DCCV;  b. CHA2DS2VASc = 5-->Xarelto.  . Chronic diastolic CHF (congestive heart failure)     a. 08/2014 Echo: EF 55-60%, mild to mod AS, mild MR, mod dil LA/RA, mildly dil RV, PASP 22mmHg.  . Morbid obesity    Assessment:   81 yr old female to begin IV heparin for atrial fibrillation.   Was on Xarelto 20 mg daily with supper prior to admission for atrial fibrillation, last dose with supper on 09/10/14.  Had just started Xarelto during last admission (8/5-11/2014); DCCV on  08/23/14.   [place] prior to admission, hitting right side of her face and back. Right scleral laceration and subconjunctival hemorrhage noted. Has been seen by Ophthamology.  Discussed briefly with Dr. Dyann Kief to confirm ok to begin IV heparin.   Will need to use aPTTs to monitor heparin, since recent Xarelto doses will skew heparin levels.  Goal of Therapy:  Heparin level 0.3-0.7 units/ml aPTT 66-102 seconds Monitor platelets by anticoagulation protocol: Yes   Plan:    Heparin drip to begin ~5pm today at 1100 units/hr, about 24 hrs after last Xarelto dose.   aPTT and Heparin level about 8 hours after drip begins.   Daily aPTT and heparin level until they correlate. Daily CBC.   Will follow for any changes with injuries from fall.   Follow up for change to oral anticoagulation when able.  Arty Baumgartner, [place] Pager: [phone] 09/11/2014,2:35 PM

## 2014-09-11 NOTE — ED Notes (Signed)
PT taken off of bedpan. PT provided self with peri-care. Urine specimen at bedside.

## 2014-09-11 NOTE — ED Notes (Addendum)
Patient returned from eye exam. Phlebotomy at the bedside.

## 2014-09-11 NOTE — ED Notes (Signed)
Stopped in hallway. Instructed by Jarrett Soho RN to return pt to room. Pt returned to room A8. Monitored by pulse ox, bp cuff, and 12-lead.

## 2014-09-11 NOTE — ED Notes (Signed)
Admitting MD called this RN as patient was being wheeled upstairs. Stated opthalmologist was on his way and wanted to exam the patient in the eye room in the ED. Stated to keep patient down here until he arrives.

## 2014-09-11 NOTE — Consult Note (Addendum)
CARDIOLOGY CONSULT NOTE   Patient ID: [name] [name] MRN: [id number], DOB/AGE: 06-23-1930   Admit date: 09/11/2014 Date of Consult: 09/11/2014   Primary Physician: Cari Caraway, MD Primary Cardiologist: Vaughan Browner, MD   Pt. Profile  81 y/o female with a h/o HTN, DM, and recent Dx of PAF s/p DCCV, who presented to the ED today from SNF following fall from her bed w/ resultant left eye trauma.  She has been found to be in afib with rvr.  Problem List  Past Medical History  Diagnosis Date  . Mild aortic stenosis     a. 10/2010 Echo: Ef 60-65%, no rwma, mild LVH no AS;  b. 08/2014 Echo: mild to mod AS.  Marland Kitchen Chronic back pain   . Peripheral neuropathy   . GERD (gastroesophageal reflux disease)   . Essential hypertension   . History of pneumonia   . History of bronchitis   . Basal cell carcinoma     a. 2013.  . Non-cardiac chest pain     a. 12/2013 LHC w/ nl coronaries and nl EF 60%.  . Frequent PAC's   . Heart murmur   . Type II diabetes mellitus   . Atrial fibrillation with RVR     a. 08/2014 s/p TEE/DCCV;  b. CHA2DS2VASc = 5-->Xarelto.  . Chronic diastolic CHF (congestive heart failure)     a. 08/2014 Echo: EF 55-60%, mild to mod AS, mild MR, mod dil LA/RA, mildly dil RV, PASP 75mmHg.  . Morbid obesity     Past Surgical History  Procedure Laterality Date  . Vesicovaginal fistula closure w/ tah  1964  . US echocardiography  04-26-09    EF 55-60%  . Cardiovascular stress test  05-13-2005    EF 67%  . Knee surgery    . Joint replacement  2000    left knee  . Abdominal hysterectomy  1964  . Toe shortened      right 2nd toe  . Cataract extraction      both eyes  . Mass excision  01/13/2012    Procedure: EXCISION MASS;  Surgeon: Ralene Ok, MD;  Location: [facility];  Service: General;  Laterality: Right;  Excision of Right Back Mass  . Left heart catheterization with coronary angiogram N/A 01/03/2014    Procedure: LEFT HEART CATHETERIZATION WITH CORONARY ANGIOGRAM;   Surgeon: Lorretta Harp, MD;  Location: [facility] CATH LAB;  Service: Cardiovascular;  Laterality: N/A;  . Cardiac catheterization    . Eye surgery      cataract  . Tee without cardioversion N/A 08/23/2014    Procedure: TRANSESOPHAGEAL ECHOCARDIOGRAM (TEE);  Surgeon: Jerline Pain, MD;  Location: [facility];  Service: Cardiovascular;  Laterality: N/A;  . Cardioversion N/A 08/23/2014    Procedure: CARDIOVERSION;  Surgeon: Jerline Pain, MD;  Location: [facility] ENDOSCOPY;  Service: Cardiovascular;  Laterality: N/A;     Allergies  Allergies  Allergen Reactions  . Codeine Nausea And Vomiting  . Demerol Other (See Comments)    Drunk feeling"  . Doxycycline Nausea And Vomiting  . Statins Other (See Comments)    "hurt"   . Zetia [Ezetimibe] Other (See Comments)    hurt    HPI   81 y/o female with a h/o mild to mod AS, HTN, DM, freq PAC's, and diast CHF.  She was admitted earlier this month w/ AF RVR and diastolic CHF.  Echo @ the time showed normal LV fxn.  Following diuresis, she underwent successful TEE and DCCV.  She was also w/u for encephalopathy and treated for Klebsiella UTI during admission.  Due to deconditioning, she was d/c'd to SNF.  D/c wt was 217 lbs.  Late last week, she began to note mild lower ext edema and increased dyspnea with stooping and bathing.  She was d/c'd from SNF on Saturday.   She says that she had a restless night last night.  Just couldn't get comfortable.  At 5:30 this AM, she rolled over to her right and rolled off the bed.  She struck her face on something (either a walker or bedside table, she isn't sure) and then fell to the floor. Her dtr is staying with her currently and called the pts grandson who then called EMS.  She was taken to the [facility] ED where she was found to be in afib with RVR.  Head/Cervical/maxillofacial CT showed soft tissue swelling on the right side of the face w/o underlying fx.  She has been seen by ophthalmology.  We have been asked to eval related  to afib.  She denies any recent h/o palpitations, chest pain, presyncope, or syncope.  She is not currently aware of her afib.  Home Medications  Prior to Admission medications   Medication Sig Start Date End Date Taking? Authorizing Provider  acetaminophen (TYLENOL) 325 MG tablet Take 2 tablets (650 mg total) by mouth every 6 (six) hours as needed for mild pain (or Fever >/= 101). 08/24/14  Yes Albertine Patricia, MD  albuterol (PROVENTIL HFA;VENTOLIN HFA) 108 (90 BASE) MCG/ACT inhaler Inhale 1 puff into the lungs every 6 (six) hours as needed for wheezing or shortness of breath.   Yes Historical Provider, MD  BIOTIN PO Take 1 tablet by mouth daily. 1,000 mg daily   Yes Historical Provider, MD  bisoprolol (ZEBETA) 5 MG tablet Take 1.5 tablets (7.5 mg total) by mouth daily. 08/24/14  Yes Deno Etienne, DO  budesonide-formoterol (SYMBICORT) 80-4.5 MCG/ACT inhaler Inhale 2 puffs into the lungs 2 (two) times daily. 04/12/14  Yes Tanda Rockers, MD  calcium citrate (CALCITRATE - DOSED IN MG ELEMENTAL CALCIUM) 950 MG tablet Take 200 mg of elemental calcium by mouth daily.   Yes Historical Provider, MD  cholecalciferol (VITAMIN D) 1000 UNITS tablet Take 1,000 Units by mouth daily.   Yes Historical Provider, MD  cyanocobalamin (,VITAMIN B-12,) 1000 MCG/ML injection Inject 1 mL (1,000 mcg total) into the muscle once a week. 08/24/14  Yes Albertine Patricia, MD  famotidine (PEPCID) 20 MG tablet Take 20 mg by mouth daily.   Yes Historical Provider, MD  feeding supplement, ENSURE ENLIVE, (ENSURE ENLIVE) LIQD Take 237 mLs by mouth 2 (two) times daily between meals. 08/24/14  Yes Albertine Patricia, MD  folic acid (FOLVITE) 1 MG tablet Take 1 tablet (1 mg total) by mouth daily. 08/24/14  Yes Albertine Patricia, MD  furosemide (LASIX) 40 MG tablet Take 40 mg by mouth daily.   Yes Historical Provider, MD  gabapentin (NEURONTIN) 300 MG capsule Take 2 capsules (600 mg total) by mouth 2 (two) times daily. 02/05/14  Yes Orson Eva, MD  glimepiride (AMARYL) 2 MG tablet Take 2 mg by mouth daily with breakfast.   Yes Historical Provider, MD  hydrOXYzine (ATARAX/VISTARIL) 10 MG tablet Take 10 mg by mouth every 12 (twelve) hours as needed.   Yes Historical Provider, MD  ipratropium-albuterol (DUONEB) 0.5-2.5 (3) MG/3ML SOLN Take 3 mLs by nebulization every 6 (six) hours as needed.    Yes Historical Provider, MD  losartan (COZAAR) 50 MG tablet Take 25 mg by mouth daily.  03/23/14  Yes Historical Provider, MD  Melatonin 3 MG TABS Take 3 mg by mouth at bedtime.   Yes Historical Provider, MD  metFORMIN (GLUCOPHAGE) 1000 MG tablet Take 1 tablet (1,000 mg total) by mouth 2 (two) times daily with a meal. 01/06/14  Yes Brittainy Erie Noe, PA-C  nitroGLYCERIN (NITROSTAT) 0.4 MG SL tablet Place 1 tablet (0.4 mg total) under the tongue every 5 (five) minutes as needed for chest pain. 01/02/14  Yes Darlin Coco, MD  pantoprazole (PROTONIX) 40 MG tablet Take 40 mg by mouth every morning.    Yes Historical Provider, MD  rivaroxaban (XARELTO) 20 MG TABS tablet Take 1 tablet (20 mg total) by mouth daily with supper. 08/24/14  Yes Silver Huguenin Elgergawy, MD  rosuvastatin (CRESTOR) 5 MG tablet Take 5 mg by mouth at bedtime.   Yes Historical Provider, MD  Spacer/Aero-Holding Chambers (AEROCHAMBER MV) inhaler Use as instructed 03/14/14  Yes Tanda Rockers, MD    Family History Family History  Problem Relation Age of Onset  . Other Mother     died @ 56, complications following childbirth  . COPD Father     died @ 55  . Heart failure Father   . Irregular heart beat Sister     1 sister with PPM.     Social History Social History   Social History  . Marital Status: Married    Spouse Name: N/A  . Number of Children: N/A  . Years of Education: N/A   Occupational History  . Retired    Social History Main Topics  . Smoking status: Former Smoker -- 0.50 packs/day for 5 years    Types: Cigarettes    Quit date: 01/14/1980  . Smokeless  tobacco: Never Used  . Alcohol Use: No  . Drug Use: No  . Sexual Activity: Not on file   Other Topics Concern  . Not on file   Social History Narrative   Lives with Husband in [place] but is currently @ SNF following admission in early August.   Has a dtr in [place], [place] and a son in [place], [place].     Review of Systems  General:  No chills, fever, night sweats or weight changes.  Cardiovascular:  No chest pain, +++ dyspnea on exertion and edema over past few day.  No orthopnea, palpitations, paroxysmal nocturnal dyspnea. Dermatological: No rash, lesions/masses Respiratory: No cough, +++ dyspnea Urologic: No hematuria, dysuria Abdominal:   No nausea, vomiting, diarrhea, bright red blood per rectum, melena, or hematemesis Neurologic:  No visual changes, wkns, changes in mental status. All other systems reviewed and are otherwise negative except as noted above.  Physical Exam  Blood pressure 117/80, pulse 81, temperature 97.5 F (36.4 C), temperature source Oral, resp. rate 25, height 5\' 5"  (1.651 m), weight 220 lb (99.791 kg), SpO2 97 %.  General: Pleasant, NAD Psych: Normal affect. Neuro: Alert and oriented X 3. Moves all extremities spontaneously. HEENT: Normal  Neck: Supple without bruits.  Obese, difficult to assess jvp. Lungs:  Resp regular and unlabored, diminished breath sounds bilat. Heart: IR, IR, distant, no s3, s4. Abdomen: Soft, non-tender, non-distended, BS + x 4.  Extremities: No clubbing, cyanosis.  1+ RLE edema, trace LLE edema. DP/PT/Radials 1+ and equal bilaterally.  Labs   Lab Results  Component Value Date   WBC 12.8* 09/11/2014   HGB 9.6* 09/11/2014   HCT 32.0* 09/11/2014   MCV 88.9  09/11/2014   PLT 296 09/11/2014    Recent Labs Lab 09/11/14 0825  NA 135  K 5.8*  CL 102  CO2 25  BUN 85*  CREATININE 1.85*  CALCIUM 8.9  GLUCOSE 113*   Radiology/Studies  Ct Head Wo Contrast  09/11/2014   CLINICAL DATA:  81 year old fell from bed to  night stand. Swelling to the right orbit and cheek bone. Headache.  EXAM: CT HEAD WITHOUT CONTRAST  CT MAXILLOFACIAL WITHOUT CONTRAST  CT CERVICAL SPINE WITHOUT CONTRAST  TECHNIQUE: Multidetector CT imaging of the head, cervical spine, and maxillofacial structures were performed using the standard protocol without intravenous contrast. Multiplanar CT image reconstructions of the cervical spine and maxillofacial structures were also generated.  COMPARISON:  08/24/2014  FINDINGS: CT HEAD FINDINGS  There is some motion artifact. There is stable mild cerebral atrophy. Stable subtle low-density in the white matter. No evidence for acute hemorrhage, mass lesion, midline shift, hydrocephalus or large infarct. Large amount of swelling in the right periorbital region, right cheek and right forehead. Mild mucosal thickening in the sphenoid sinuses. No evidence for a calvarial fracture.  CT MAXILLOFACIAL FINDINGS  Soft tissue swelling involving the right cheek, right periorbital region and right forehead. Globes are intact. Mandible is intact. The mandibular condyles are located. Mild mucosal thickening in the sphenoid sinuses. No evidence for an acute facial bone fracture. Specifically, the pterygoid plates are intact. Degenerative facet disease in the cervical spine. Incidentally, there is periapical lucency involving the right lower second molar. The lucency or caries involving the right lower canine tooth.  CT CERVICAL SPINE FINDINGS  Limited evaluation of the lung apices. Negative for fracture or dislocation in cervical spine. No soft tissue swelling in the neck. Bilateral facet disease in the cervical spine. Bone detail in the lower neck at the cervicothoracic junction is limited. Overall alignment of cervical spine is normal. There is disc space narrowing at C6-C7 with bridging anterior osteophytes.  IMPRESSION: No acute intracranial abnormality. Mild atrophy and evidence for chronic small vessel ischemic changes.   Extensive soft tissue swelling on the right side of the face. No underlying fracture.  Multilevel degenerative changes in the cervical spine without acute bone abnormality.   Electronically Signed   By: Markus Daft M.D.   On: 09/11/2014 09:24   Ct Cervical Spine Wo Contrast  09/11/2014   CLINICAL DATA:  81 year old fell from bed to night stand. Swelling to the right orbit and cheek bone. Headache.  EXAM: CT HEAD WITHOUT CONTRAST  CT MAXILLOFACIAL WITHOUT CONTRAST  CT CERVICAL SPINE WITHOUT CONTRAST  TECHNIQUE: Multidetector CT imaging of the head, cervical spine, and maxillofacial structures were performed using the standard protocol without intravenous contrast. Multiplanar CT image reconstructions of the cervical spine and maxillofacial structures were also generated.  COMPARISON:  08/24/2014  FINDINGS: CT HEAD FINDINGS  There is some motion artifact. There is stable mild cerebral atrophy. Stable subtle low-density in the white matter. No evidence for acute hemorrhage, mass lesion, midline shift, hydrocephalus or large infarct. Large amount of swelling in the right periorbital region, right cheek and right forehead. Mild mucosal thickening in the sphenoid sinuses. No evidence for a calvarial fracture.  CT MAXILLOFACIAL FINDINGS  Soft tissue swelling involving the right cheek, right periorbital region and right forehead. Globes are intact. Mandible is intact. The mandibular condyles are located. Mild mucosal thickening in the sphenoid sinuses. No evidence for an acute facial bone fracture. Specifically, the pterygoid plates are intact. Degenerative facet disease in the cervical  spine. Incidentally, there is periapical lucency involving the right lower second molar. The lucency or caries involving the right lower canine tooth.  CT CERVICAL SPINE FINDINGS  Limited evaluation of the lung apices. Negative for fracture or dislocation in cervical spine. No soft tissue swelling in the neck. Bilateral facet disease in  the cervical spine. Bone detail in the lower neck at the cervicothoracic junction is limited. Overall alignment of cervical spine is normal. There is disc space narrowing at C6-C7 with bridging anterior osteophytes.  IMPRESSION: No acute intracranial abnormality. Mild atrophy and evidence for chronic small vessel ischemic changes.  Extensive soft tissue swelling on the right side of the face. No underlying fracture.  Multilevel degenerative changes in the cervical spine without acute bone abnormality.   Electronically Signed   By: Markus Daft M.D.   On: 09/11/2014 09:24   Ct Maxillofacial Wo Cm  09/11/2014   CLINICAL DATA:  81 year old fell from bed to night stand. Swelling to the right orbit and cheek bone. Headache.  EXAM: CT HEAD WITHOUT CONTRAST  CT MAXILLOFACIAL WITHOUT CONTRAST  CT CERVICAL SPINE WITHOUT CONTRAST  TECHNIQUE: Multidetector CT imaging of the head, cervical spine, and maxillofacial structures were performed using the standard protocol without intravenous contrast. Multiplanar CT image reconstructions of the cervical spine and maxillofacial structures were also generated.  COMPARISON:  08/24/2014  FINDINGS: CT HEAD FINDINGS  There is some motion artifact. There is stable mild cerebral atrophy. Stable subtle low-density in the white matter. No evidence for acute hemorrhage, mass lesion, midline shift, hydrocephalus or large infarct. Large amount of swelling in the right periorbital region, right cheek and right forehead. Mild mucosal thickening in the sphenoid sinuses. No evidence for a calvarial fracture.  CT MAXILLOFACIAL FINDINGS  Soft tissue swelling involving the right cheek, right periorbital region and right forehead. Globes are intact. Mandible is intact. The mandibular condyles are located. Mild mucosal thickening in the sphenoid sinuses. No evidence for an acute facial bone fracture. Specifically, the pterygoid plates are intact. Degenerative facet disease in the cervical spine.  Incidentally, there is periapical lucency involving the right lower second molar. The lucency or caries involving the right lower canine tooth.  CT CERVICAL SPINE FINDINGS  Limited evaluation of the lung apices. Negative for fracture or dislocation in cervical spine. No soft tissue swelling in the neck. Bilateral facet disease in the cervical spine. Bone detail in the lower neck at the cervicothoracic junction is limited. Overall alignment of cervical spine is normal. There is disc space narrowing at C6-C7 with bridging anterior osteophytes.  IMPRESSION: No acute intracranial abnormality. Mild atrophy and evidence for chronic small vessel ischemic changes.  Extensive soft tissue swelling on the right side of the face. No underlying fracture.  Multilevel degenerative changes in the cervical spine without acute bone abnormality.   Electronically Signed   By: Markus Daft M.D.   On: 09/11/2014 09:24   ECG  Afib, 117, no acute st/t changes.  ASSESSMENT AND PLAN  1.  Afib RVR:  Pt presented today following fall from bed resulting to significant bruising and swelling to the right side of her face and eye.  She has been found to be back in afib.  She does not have palpitations, however has noted some dyspnea and lower ext edema over the past few days.  It's not clear how long she has been back in afib.   She has mild lower ext edema on exam.  Wt appears to be up a few  lbs since her discharge 8/16.  It was previously felt to be important to maintain sinus rhythm given diast chf.  Cont bb.  Rate up into 140's -  Pressure soft.  Add amio.  Will consider amio.  Ideally would like to cont xarelto but ok to use heparin in short term to ensure that will tolerate continued anticoagulation w/o further ocular/facial bleeding. If she does not convert, will req repeat DCCV.  2.  Acute on chronic diastolic CHF: Pt has been noticing mild DOE, lower ext edema, and bendopnea since last Thursday.  Wt up a few lbs since last d/c (if  accurate).  ? If afib returned late last week.  She is on PO lasix @ home.  BUN/Creat up since last d/c though clinically she appears to be mildly volume overloaded.   3.  R subconjunctival hemorrhage:  Seen by ophthalmology.  No acute need for intervention.  4.  CKD IV:  BUN/Creat up.  Follow.  Signed, Murray Hodgkins, NP 09/11/2014, 12:12 PM  The patient was seen, examined and discussed with Carmela Rima, NP and agree as above.  81 year old female with recently diagnosed PAF, s/p DCCV earlier in August, who was admitted after a fall at the nursing home with facial injury. She was found to be back in a-fib with RVR. She also has h/o chronic diastolic CHF, currently mildly fluid overloaded.  We will start amiodarone loading/infusion, continue iv Heparin, if tolerated, switch to Xarelto. If no spontaneous cardioversion, we will consider repeat DCCV. Her K is 6.1, not hemolyzed, we wil give 30 g of Kayexylate and repeat BMP in 2 hours.  We will give one dose of iv Lasix 40 mg and reassess crea in the am.   Dorothy Spark 09/11/2014

## 2014-09-11 NOTE — ED Notes (Signed)
Patient given water to drink.  

## 2014-09-11 NOTE — ED Notes (Signed)
PT placed in gown and in bed. Monitored by pulse ox, bp cuff, and 12-lead.

## 2014-09-11 NOTE — Progress Notes (Signed)
1800 transferred pt to RM 15 for monitoring . Pt attempting to use  By self without  Using call bells the patient with lapses of confusion

## 2014-09-11 NOTE — ED Notes (Addendum)
Patient being transported upstairs by Erlene Quan, EMT. Deneise Lever, RN [address] made aware of the plan of care.

## 2014-09-11 NOTE — ED Notes (Signed)
Cardiology at the bedside.

## 2014-09-11 NOTE — ED Provider Notes (Signed)
CSN: [id number]     Arrival date & time 09/11/14  0726 History   First MD Initiated Contact with Patient 09/11/14 [phone]     Chief Complaint  Patient presents with  . Head Injury     (Consider location/radiation/quality/duration/timing/severity/associated sxs/prior Treatment) HPI  [name] [name] is a 81 y.o. female who was having trouble with low back pain, when she accidentally rolled out of bed, striking her face on something, injuring it. She presents complaining of pain in face, headache and low back. She was recently discharged from hospital after being treated for a UTI. She states that she is taking her medications. She presents by EMS for evaluation. She lives at home with husband, and her daughter is visiting, to help out. She does not use a walker to ambulate. She denies recent fever, chills, nausea, vomiting, cough, shortness of breath or chest pain. There are no other known modifying factors.   Past Medical History  Diagnosis Date  . Mild aortic stenosis     a. 10/2010 Echo: Ef 60-65%, no rwma, mild LVH no AS;  b. 08/2014 Echo: mild to mod AS.  Marland Kitchen Chronic back pain   . Peripheral neuropathy   . GERD (gastroesophageal reflux disease)   . Essential hypertension   . History of pneumonia   . History of bronchitis   . Basal cell carcinoma     a. 2013.  . Non-cardiac chest pain     a. 12/2013 LHC w/ nl coronaries and nl EF 60%.  . Frequent PAC's   . Heart murmur   . Type II diabetes mellitus   . Atrial fibrillation with RVR     a. 08/2014 s/p TEE/DCCV;  b. CHA2DS2VASc = 5-->Xarelto.  . Chronic diastolic CHF (congestive heart failure)     a. 08/2014 Echo: EF 55-60%, mild to mod AS, mild MR, mod dil LA/RA, mildly dil RV, PASP 55mmHg.  . Morbid obesity    Past Surgical History  Procedure Laterality Date  . Vesicovaginal fistula closure w/ tah  1964  . US echocardiography  04-26-09    EF 55-60%  . Cardiovascular stress test  05-13-2005    EF 67%  . Knee surgery    . Joint  replacement  2000    left knee  . Abdominal hysterectomy  1964  . Toe shortened      right 2nd toe  . Cataract extraction      both eyes  . Mass excision  01/13/2012    Procedure: EXCISION MASS;  Surgeon: Ralene Ok, MD;  Location: [facility];  Service: General;  Laterality: Right;  Excision of Right Back Mass  . Left heart catheterization with coronary angiogram N/A 01/03/2014    Procedure: LEFT HEART CATHETERIZATION WITH CORONARY ANGIOGRAM;  Surgeon: Lorretta Harp, MD;  Location: [facility] CATH LAB;  Service: Cardiovascular;  Laterality: N/A;  . Cardiac catheterization    . Eye surgery      cataract  . Tee without cardioversion N/A 08/23/2014    Procedure: TRANSESOPHAGEAL ECHOCARDIOGRAM (TEE);  Surgeon: Jerline Pain, MD;  Location: [facility];  Service: Cardiovascular;  Laterality: N/A;  . Cardioversion N/A 08/23/2014    Procedure: CARDIOVERSION;  Surgeon: Jerline Pain, MD;  Location: [facility] ENDOSCOPY;  Service: Cardiovascular;  Laterality: N/A;   Family History  Problem Relation Age of Onset  . Other Mother     died @ 17, complications following childbirth  . COPD Father     died @ 66  . Heart failure  Father   . Irregular heart beat Sister     1 sister with PPM.   Social History  Substance Use Topics  . Smoking status: Former Smoker -- 0.50 packs/day for 5 years    Types: Cigarettes    Quit date: 01/14/1980  . Smokeless tobacco: Never Used  . Alcohol Use: No   OB History    No data available     Review of Systems  All other systems reviewed and are negative.     Allergies  Codeine; Demerol; Doxycycline; Statins; and Zetia  Home Medications   Prior to Admission medications   Medication Sig Start Date End Date Taking? Authorizing Provider  acetaminophen (TYLENOL) 325 MG tablet Take 2 tablets (650 mg total) by mouth every 6 (six) hours as needed for mild pain (or Fever >/= 101). 08/24/14  Yes Albertine Patricia, MD  albuterol (PROVENTIL HFA;VENTOLIN HFA) 108 (90 BASE)  MCG/ACT inhaler Inhale 1 puff into the lungs every 6 (six) hours as needed for wheezing or shortness of breath.   Yes Historical Provider, MD  BIOTIN PO Take 1 tablet by mouth daily. 1,000 mg daily   Yes Historical Provider, MD  bisoprolol (ZEBETA) 5 MG tablet Take 1.5 tablets (7.5 mg total) by mouth daily. 08/24/14  Yes Deno Etienne, DO  budesonide-formoterol (SYMBICORT) 80-4.5 MCG/ACT inhaler Inhale 2 puffs into the lungs 2 (two) times daily. 04/12/14  Yes Tanda Rockers, MD  calcium citrate (CALCITRATE - DOSED IN MG ELEMENTAL CALCIUM) 950 MG tablet Take 200 mg of elemental calcium by mouth daily.   Yes Historical Provider, MD  cholecalciferol (VITAMIN D) 1000 UNITS tablet Take 1,000 Units by mouth daily.   Yes Historical Provider, MD  cyanocobalamin (,VITAMIN B-12,) 1000 MCG/ML injection Inject 1 mL (1,000 mcg total) into the muscle once a week. 08/24/14  Yes Albertine Patricia, MD  famotidine (PEPCID) 20 MG tablet Take 20 mg by mouth daily.   Yes Historical Provider, MD  feeding supplement, ENSURE ENLIVE, (ENSURE ENLIVE) LIQD Take 237 mLs by mouth 2 (two) times daily between meals. 08/24/14  Yes Albertine Patricia, MD  folic acid (FOLVITE) 1 MG tablet Take 1 tablet (1 mg total) by mouth daily. 08/24/14  Yes Albertine Patricia, MD  furosemide (LASIX) 40 MG tablet Take 40 mg by mouth daily.   Yes Historical Provider, MD  gabapentin (NEURONTIN) 300 MG capsule Take 2 capsules (600 mg total) by mouth 2 (two) times daily. 02/05/14  Yes Orson Eva, MD  glimepiride (AMARYL) 2 MG tablet Take 2 mg by mouth daily with breakfast.   Yes Historical Provider, MD  hydrOXYzine (ATARAX/VISTARIL) 10 MG tablet Take 10 mg by mouth every 12 (twelve) hours as needed.   Yes Historical Provider, MD  ipratropium-albuterol (DUONEB) 0.5-2.5 (3) MG/3ML SOLN Take 3 mLs by nebulization every 6 (six) hours as needed.    Yes Historical Provider, MD  losartan (COZAAR) 50 MG tablet Take 25 mg by mouth daily.  03/23/14  Yes Historical  Provider, MD  Melatonin 3 MG TABS Take 3 mg by mouth at bedtime.   Yes Historical Provider, MD  metFORMIN (GLUCOPHAGE) 1000 MG tablet Take 1 tablet (1,000 mg total) by mouth 2 (two) times daily with a meal. 01/06/14  Yes Brittainy Erie Noe, PA-C  nitroGLYCERIN (NITROSTAT) 0.4 MG SL tablet Place 1 tablet (0.4 mg total) under the tongue every 5 (five) minutes as needed for chest pain. 01/02/14  Yes Darlin Coco, MD  pantoprazole (PROTONIX) 40 MG tablet Take  40 mg by mouth every morning.    Yes Historical Provider, MD  rivaroxaban (XARELTO) 20 MG TABS tablet Take 1 tablet (20 mg total) by mouth daily with supper. 08/24/14  Yes Silver Huguenin Elgergawy, MD  rosuvastatin (CRESTOR) 5 MG tablet Take 5 mg by mouth at bedtime.   Yes Historical Provider, MD  Spacer/Aero-Holding Chambers (AEROCHAMBER MV) inhaler Use as instructed 03/14/14  Yes Tanda Rockers, MD   BP 99/60 mmHg  Pulse 123  Temp(Src) 97.8 F (36.6 C) (Oral)  Resp 20  Ht 5\' 3"  (1.6 m)  Wt 234 lb 2.1 oz (106.2 kg)  BMI 41.48 kg/m2  SpO2 93% Physical Exam  Constitutional: She is oriented to person, place, and time. She appears well-developed.  Elderly, obese.  HENT:  Head: Normocephalic and atraumatic.  Right Ear: External ear normal.  Left Ear: External ear normal.  Moderate right periorbital swelling with ecchymosis. No midface crepitation, deformity or swelling. Normal TMJ motion.  Eyes: Conjunctivae and EOM are normal. Pupils are equal, round, and reactive to light.  Sub-Conjunctival hemorrhage, right, with possible scleral laceration inferior lateral aspect. Pupil reactive. No apparent hyphema. Extraocular muscles are intact.  Neck: Normal range of motion and phonation normal. Neck supple.  Cardiovascular: Normal rate, regular rhythm and normal heart sounds.   Pulmonary/Chest: Effort normal and breath sounds normal. She exhibits no bony tenderness.  Abdominal: Soft. There is no tenderness.  Musculoskeletal: Normal range of motion.   Superficial contusion, right anterior shoulder with normal range of motion. Shoulders, elbows, wrists, hips, knees and ankles. Strength 5 over 5 upper extremities 4/5 lower extremities, bilaterally.  Neurological: She is alert and oriented to person, place, and time. No cranial nerve deficit or sensory deficit. She exhibits normal muscle tone. Coordination normal.  Skin: Skin is warm, dry and intact.  Abdomen panniculus erythema and moistness consistent with fungal infection.  Psychiatric: She has a normal mood and affect. Her behavior is normal. Judgment and thought content normal.  Nursing note and vitals reviewed.   ED Course  Procedures (including critical care time) Medications  bisoprolol (ZEBETA) tablet 7.5 mg (not administered)  cyanocobalamin ((VITAMIN B-12)) injection 1,000 mcg (not administered)  feeding supplement (ENSURE ENLIVE) (ENSURE ENLIVE) liquid 237 mL (not administered)  folic acid (FOLVITE) tablet 1 mg (not administered)  famotidine (PEPCID) tablet 20 mg (not administered)  budesonide-formoterol (SYMBICORT) 80-4.5 MCG/ACT inhaler 2 puff (not administered)  calcium citrate (CALCITRATE - dosed in mg elemental calcium) tablet 200 mg of elemental calcium (not administered)  ipratropium-albuterol (DUONEB) 0.5-2.5 (3) MG/3ML nebulizer solution 3 mL (not administered)  gabapentin (NEURONTIN) capsule 300 mg (not administered)  nitroGLYCERIN (NITROSTAT) SL tablet 0.4 mg (not administered)  albuterol (PROVENTIL) (2.5 MG/3ML) 0.083% nebulizer solution 2.5 mg (not administered)  pantoprazole (PROTONIX) EC tablet 40 mg (not administered)  sodium chloride 0.9 % injection 3 mL (not administered)  sodium chloride 0.9 % injection 3 mL (not administered)  sodium chloride 0.9 % injection 3 mL (not administered)  0.9 %  sodium chloride infusion (not administered)  acetaminophen (TYLENOL) tablet 650 mg (not administered)    Or  acetaminophen (TYLENOL) suppository 650 mg (not  administered)  HYDROcodone-acetaminophen (NORCO/VICODIN) 5-325 MG per tablet 1 tablet (1 tablet Oral Given 09/11/14 1307)  senna-docusate (Senokot-S) tablet 1 tablet (not administered)  ondansetron (ZOFRAN) tablet 4 mg (not administered)    Or  ondansetron (ZOFRAN) injection 4 mg (not administered)  nystatin cream (MYCOSTATIN) (not administered)  insulin glargine (LANTUS) injection 10 Units (not administered)  insulin  aspart (novoLOG) injection 0-9 Units (not administered)  erythromycin ophthalmic ointment (not administered)  heparin ADULT infusion 100 units/mL (25000 units/250 mL) (not administered)  amiodarone (NEXTERONE) 1.8 mg/mL load via infusion 150 mg (not administered)    Followed by  amiodarone (NEXTERONE PREMIX) 360 MG/200ML (1.8 mg/mL) IV infusion (not administered)    Followed by  amiodarone (NEXTERONE PREMIX) 360 MG/200ML (1.8 mg/mL) IV infusion (not administered)  sodium chloride 0.9 % bolus 500 mL (0 mLs Intravenous Stopped 09/11/14 1301)    Patient Vitals for the past 24 hrs:  BP Temp Temp src Pulse Resp SpO2 Height Weight  09/11/14 1359 99/60 mmHg 97.8 F (36.6 C) Oral (!) 123 20 93 % 5\' 3"  (1.6 m) 234 lb 2.1 oz (106.2 kg)  09/11/14 1213 - - - 91 24 95 % - -  09/11/14 1145 101/70 mmHg - - - (!) 28 - - -  09/11/14 1130 117/80 mmHg - - - 25 - - -  09/11/14 1100 124/91 mmHg - - - 23 - - -  09/11/14 1045 124/77 mmHg - - - 22 - - -  09/11/14 1030 116/77 mmHg - - - 20 - - -  09/11/14 1000 100/71 mmHg - - - 20 - - -  09/11/14 0945 (!) 104/54 mmHg - - 81 22 97 % - -  09/11/14 0931 95/71 mmHg - - 110 25 96 % - -  09/11/14 0830 135/94 mmHg - - - 25 - - -  09/11/14 0745 (!) 127/41 mmHg - - (!) 51 22 96 % - -  09/11/14 0731 105/60 mmHg 97.5 F (36.4 C) Oral (!) 121 17 94 % 5\' 5"  (1.651 m) 220 lb (99.791 kg)  09/11/14 0729 - - - - - 94 % - -    10:35 AM Reevaluation with update and discussion. After initial assessment and treatment, an updated evaluation reveals clinical  status is unchanged. I irrigated the right eye with saline, 10 cc, and there is a notable superficial scleral laceration inferior lower, which is not actively bleeding. No hyphema, right eye at this time. Pupil is reactive, right eye. Findings discussed with the patient. Tameyah Koch L    10:36 AM-Consult complete with Hospitalist APP. Patient case explained and discussed. She agrees to admit patient for further evaluation and treatment. Call ended at 10:43   Labs Review Labs Reviewed  BASIC METABOLIC PANEL - Abnormal; Notable for the following:    Potassium 5.8 (*)    Glucose, Bld 113 (*)    BUN 85 (*)    Creatinine, Ser 1.85 (*)    GFR calc non Af Amer 24 (*)    GFR calc Af Amer 28 (*)    All other components within normal limits  CBC WITH DIFFERENTIAL/PLATELET - Abnormal; Notable for the following:    WBC 12.8 (*)    RBC 3.60 (*)    Hemoglobin 9.6 (*)    HCT 32.0 (*)    RDW 15.6 (*)    Neutrophils Relative % 79 (*)    Neutro Abs 10.2 (*)    All other components within normal limits  BASIC METABOLIC PANEL - Abnormal; Notable for the following:    Potassium 5.7 (*)    Glucose, Bld 156 (*)    BUN 82 (*)    Creatinine, Ser 1.87 (*)    Calcium 8.8 (*)    GFR calc non Af Amer 24 (*)    GFR calc Af Amer 28 (*)    All other components within  normal limits  TSH  TROPONIN I  TROPONIN I  TROPONIN I  HEMOGLOBIN A1C  APTT  HEPARIN LEVEL (UNFRACTIONATED)  BASIC METABOLIC PANEL  CBG MONITORING, ED    Imaging Review Dg Chest 2 View  09/11/2014   CLINICAL DATA:  [place] today. Back and chest pain. Difficulty breathing.  EXAM: CHEST  2 VIEW  COMPARISON:  08/24/2014  FINDINGS: The heart is mildly enlarged but stable. The mediastinal and hilar contours are within normal limits and unchanged. No acute pulmonary findings. No pleural effusion. The bony thorax is intact.  IMPRESSION: Stable cardiac enlargement.  No acute pulmonary findings.   Electronically Signed   By: Marijo Sanes M.D.    On: 09/11/2014 13:52   Ct Head Wo Contrast  09/11/2014   CLINICAL DATA:  81 year old fell from bed to night stand. Swelling to the right orbit and cheek bone. Headache.  EXAM: CT HEAD WITHOUT CONTRAST  CT MAXILLOFACIAL WITHOUT CONTRAST  CT CERVICAL SPINE WITHOUT CONTRAST  TECHNIQUE: Multidetector CT imaging of the head, cervical spine, and maxillofacial structures were performed using the standard protocol without intravenous contrast. Multiplanar CT image reconstructions of the cervical spine and maxillofacial structures were also generated.  COMPARISON:  08/24/2014  FINDINGS: CT HEAD FINDINGS  There is some motion artifact. There is stable mild cerebral atrophy. Stable subtle low-density in the white matter. No evidence for acute hemorrhage, mass lesion, midline shift, hydrocephalus or large infarct. Large amount of swelling in the right periorbital region, right cheek and right forehead. Mild mucosal thickening in the sphenoid sinuses. No evidence for a calvarial fracture.  CT MAXILLOFACIAL FINDINGS  Soft tissue swelling involving the right cheek, right periorbital region and right forehead. Globes are intact. Mandible is intact. The mandibular condyles are located. Mild mucosal thickening in the sphenoid sinuses. No evidence for an acute facial bone fracture. Specifically, the pterygoid plates are intact. Degenerative facet disease in the cervical spine. Incidentally, there is periapical lucency involving the right lower second molar. The lucency or caries involving the right lower canine tooth.  CT CERVICAL SPINE FINDINGS  Limited evaluation of the lung apices. Negative for fracture or dislocation in cervical spine. No soft tissue swelling in the neck. Bilateral facet disease in the cervical spine. Bone detail in the lower neck at the cervicothoracic junction is limited. Overall alignment of cervical spine is normal. There is disc space narrowing at C6-C7 with bridging anterior osteophytes.  IMPRESSION: No  acute intracranial abnormality. Mild atrophy and evidence for chronic small vessel ischemic changes.  Extensive soft tissue swelling on the right side of the face. No underlying fracture.  Multilevel degenerative changes in the cervical spine without acute bone abnormality.   Electronically Signed   By: Markus Daft M.D.   On: 09/11/2014 09:24   Ct Cervical Spine Wo Contrast  09/11/2014   CLINICAL DATA:  81 year old fell from bed to night stand. Swelling to the right orbit and cheek bone. Headache.  EXAM: CT HEAD WITHOUT CONTRAST  CT MAXILLOFACIAL WITHOUT CONTRAST  CT CERVICAL SPINE WITHOUT CONTRAST  TECHNIQUE: Multidetector CT imaging of the head, cervical spine, and maxillofacial structures were performed using the standard protocol without intravenous contrast. Multiplanar CT image reconstructions of the cervical spine and maxillofacial structures were also generated.  COMPARISON:  08/24/2014  FINDINGS: CT HEAD FINDINGS  There is some motion artifact. There is stable mild cerebral atrophy. Stable subtle low-density in the white matter. No evidence for acute hemorrhage, mass lesion, midline shift, hydrocephalus or large infarct. Large  amount of swelling in the right periorbital region, right cheek and right forehead. Mild mucosal thickening in the sphenoid sinuses. No evidence for a calvarial fracture.  CT MAXILLOFACIAL FINDINGS  Soft tissue swelling involving the right cheek, right periorbital region and right forehead. Globes are intact. Mandible is intact. The mandibular condyles are located. Mild mucosal thickening in the sphenoid sinuses. No evidence for an acute facial bone fracture. Specifically, the pterygoid plates are intact. Degenerative facet disease in the cervical spine. Incidentally, there is periapical lucency involving the right lower second molar. The lucency or caries involving the right lower canine tooth.  CT CERVICAL SPINE FINDINGS  Limited evaluation of the lung apices. Negative for  fracture or dislocation in cervical spine. No soft tissue swelling in the neck. Bilateral facet disease in the cervical spine. Bone detail in the lower neck at the cervicothoracic junction is limited. Overall alignment of cervical spine is normal. There is disc space narrowing at C6-C7 with bridging anterior osteophytes.  IMPRESSION: No acute intracranial abnormality. Mild atrophy and evidence for chronic small vessel ischemic changes.  Extensive soft tissue swelling on the right side of the face. No underlying fracture.  Multilevel degenerative changes in the cervical spine without acute bone abnormality.   Electronically Signed   By: Markus Daft M.D.   On: 09/11/2014 09:24   Ct Maxillofacial Wo Cm  09/11/2014   CLINICAL DATA:  81 year old fell from bed to night stand. Swelling to the right orbit and cheek bone. Headache.  EXAM: CT HEAD WITHOUT CONTRAST  CT MAXILLOFACIAL WITHOUT CONTRAST  CT CERVICAL SPINE WITHOUT CONTRAST  TECHNIQUE: Multidetector CT imaging of the head, cervical spine, and maxillofacial structures were performed using the standard protocol without intravenous contrast. Multiplanar CT image reconstructions of the cervical spine and maxillofacial structures were also generated.  COMPARISON:  08/24/2014  FINDINGS: CT HEAD FINDINGS  There is some motion artifact. There is stable mild cerebral atrophy. Stable subtle low-density in the white matter. No evidence for acute hemorrhage, mass lesion, midline shift, hydrocephalus or large infarct. Large amount of swelling in the right periorbital region, right cheek and right forehead. Mild mucosal thickening in the sphenoid sinuses. No evidence for a calvarial fracture.  CT MAXILLOFACIAL FINDINGS  Soft tissue swelling involving the right cheek, right periorbital region and right forehead. Globes are intact. Mandible is intact. The mandibular condyles are located. Mild mucosal thickening in the sphenoid sinuses. No evidence for an acute facial bone  fracture. Specifically, the pterygoid plates are intact. Degenerative facet disease in the cervical spine. Incidentally, there is periapical lucency involving the right lower second molar. The lucency or caries involving the right lower canine tooth.  CT CERVICAL SPINE FINDINGS  Limited evaluation of the lung apices. Negative for fracture or dislocation in cervical spine. No soft tissue swelling in the neck. Bilateral facet disease in the cervical spine. Bone detail in the lower neck at the cervicothoracic junction is limited. Overall alignment of cervical spine is normal. There is disc space narrowing at C6-C7 with bridging anterior osteophytes.  IMPRESSION: No acute intracranial abnormality. Mild atrophy and evidence for chronic small vessel ischemic changes.  Extensive soft tissue swelling on the right side of the face. No underlying fracture.  Multilevel degenerative changes in the cervical spine without acute bone abnormality.   Electronically Signed   By: Markus Daft M.D.   On: 09/11/2014 09:24   I have personally reviewed and evaluated these images and lab results as part of my medical decision-making.  EKG Interpretation   Date/Time:  Monday September 11 2014 07:32:01 EDT Ventricular Rate:  117 PR Interval:    QRS Duration: 77 QT Interval:  312 QTC Calculation: 435 R Axis:   81 Text Interpretation:  Atrial fibrillation Borderline right axis deviation  Low voltage, precordial leads Borderline T abnormalities, anterior leads  Atrial fibrillation recurred  Since last tracing Confirmed by Thelbert Gartin  MD,  Vira Agar ([id number]) on 09/11/2014 8:31:43 AM      MDM   Final diagnoses:  Fall, initial encounter  Contusion of face, initial encounter  Subconjunctival hemorrhage, right  AKI (acute kidney injury)  Hyperkalemia  Scleral laceration, right, initial encounter  Panniculitis    Fall, without serious injury. Notable right eye injury with conjunctival hemorrhage and chemosis and superficial  scleral laceration. No evidence for damage to the pupillary mechanism of the right eye. Mild AKI, and hyperkalemia and will require observation and treatment. EKG is reassuring. Ongoing low back pain, which contributed to her fall. Atrial fibrillation recurrence is likely secondary to AKI. Note that she was recently cardioverted. She will require inpatient admission for treatment.  Nursing Notes Reviewed/ Care Coordinated, and agree without changes. Applicable Imaging Reviewed.  Interpretation of Laboratory Data incorporated into ED treatment  Plan: Admit    Daleen Bo, MD 09/11/14 [id number]

## 2014-09-11 NOTE — ED Notes (Signed)
Erlene Quan, EMT to take patient upstairs.

## 2014-09-11 NOTE — H&P (Signed)
[facility] History and Physical                                                                                    [name] [name], is a 81 y.o. female  MRN: [id number]   DOB - 03-04-1930  Admit Date - 09/11/2014  Outpatient Primary MD for the patient is [facility], [place]  Referring Physician:  Dr. Eulis Foster  Chief Complaint:   Chief Complaint  Patient presents with  . Head Injury     HPI  [name] [name]  is a 81 y.o. female, with diabetes and peripheral neuropathy, morbid obesity, recent hospitalization and cardioversion for atrial fibrillation with RVR who presents to the emergency department today after rolling out of bed and sustaining a trauma to her right eye. She was found to be in atrial fibrillation with RVR.  She was discharged from [facility] on 8/11 and went to [facility] until 8/27 when she went home with home health services.  She reports rolling out of bed at approximately 6 am this morning and hitting her eye on the way down.  She denies recent dizziness, fever, cough, or chest pain.  She states she is always short of breath and that her breathing is actually better than usual.  She denies dysuria, changes in bowel habits and vomiting.  She is eating well but reports that she does not like to drink water.  In the ER her pulse rate fluctuated between 51 and 121. White count is 12.8, and her potassium is mildly elevated at 5.8. The rest of her labs appear to be stable. She has a contusion over her right eye and is weeping a small amount of blood from her eyelid. She is currently anticoagulated with Xeralto.  Review of Systems   In addition to the HPI above,  No Fever-chills, No Headache, No changes with Vision or hearing, No problems swallowing food or Liquids, No Chest pain, Cough or acute Shortness of Breath, No Abdominal pain, No Nausea or Vomiting, Bowel movements are regular, No Blood in stool or Urine, No dysuria, No new skin rashes or bruises, No new joints pains-aches,   No new weakness, tingling, numbness in any extremity, No recent weight gain or loss, A full 10 point Review of Systems was done, except as stated above, all other Review of Systems were negative.  Past Medical History  Past Medical History  Diagnosis Date  . Mild aortic stenosis     a. 10/2010 Echo: Ef 60-65%, no rwma, mild LVH no AS.  Marland Kitchen Chronic back pain   . Peripheral neuropathy   . GERD (gastroesophageal reflux disease)   . Hypertension   . History of pneumonia   . History of bronchitis   . Basal cell carcinoma     a. 2013.  . Non-cardiac chest pain     LHC w/ nl coronaries and nl EF 60% -12/2013  . PAC (premature atrial contraction)   . Heart murmur   . Type II diabetes mellitus     TYPE 2  . Atrial fibrillation with RVR 8/16    Past Surgical History  Procedure Laterality Date  . Vesicovaginal fistula closure w/ tah  1964  . US echocardiography  04-26-09    EF 55-60%  . Cardiovascular stress test  05-13-2005    EF 67%  . Knee surgery    . Joint replacement  2000    left knee  . Abdominal hysterectomy  1964  . Toe shortened      right 2nd toe  . Cataract extraction      both eyes  . Mass excision  01/13/2012    Procedure: EXCISION MASS;  Surgeon: Ralene Ok, MD;  Location: [facility];  Service: General;  Laterality: Right;  Excision of Right Back Mass  . Left heart catheterization with coronary angiogram N/A 01/03/2014    Procedure: LEFT HEART CATHETERIZATION WITH CORONARY ANGIOGRAM;  Surgeon: Lorretta Harp, MD;  Location: [facility] CATH LAB;  Service: Cardiovascular;  Laterality: N/A;  . Cardiac catheterization    . Eye surgery      cataract  . Tee without cardioversion N/A 08/23/2014    Procedure: TRANSESOPHAGEAL ECHOCARDIOGRAM (TEE);  Surgeon: Jerline Pain, MD;  Location: [facility];  Service: Cardiovascular;  Laterality: N/A;  . Cardioversion N/A 08/23/2014    Procedure: CARDIOVERSION;  Surgeon: Jerline Pain, MD;  Location: [facility] ENDOSCOPY;  Service:  Cardiovascular;  Laterality: N/A;      Social History Social History  Substance Use Topics  . Smoking status: Former Smoker -- 0.50 packs/day for 5 years    Types: Cigarettes    Quit date: 01/14/1980  . Smokeless tobacco: Never Used  . Alcohol Use: No   moved from skilled rehabilitation to home on 8/27. Has home health services for part-time support  Family History Family History  Problem Relation Age of Onset  . Other Mother     died @ 42, complications following childbirth  . COPD Father     died @ 16  . Heart failure Father   . Irregular heart beat Sister     1 sister with PPM.    Prior to Admission medications   Medication Sig Start Date End Date Taking? Authorizing Provider  acetaminophen (TYLENOL) 325 MG tablet Take 2 tablets (650 mg total) by mouth every 6 (six) hours as needed for mild pain (or Fever >/= 101). 08/24/14  Yes Albertine Patricia, MD  albuterol (PROVENTIL HFA;VENTOLIN HFA) 108 (90 BASE) MCG/ACT inhaler Inhale 1 puff into the lungs every 6 (six) hours as needed for wheezing or shortness of breath.   Yes Historical Provider, MD  BIOTIN PO Take 1 tablet by mouth daily. 1,000 mg daily   Yes Historical Provider, MD  bisoprolol (ZEBETA) 5 MG tablet Take 1.5 tablets (7.5 mg total) by mouth daily. 08/24/14  Yes Deno Etienne, DO  budesonide-formoterol (SYMBICORT) 80-4.5 MCG/ACT inhaler Inhale 2 puffs into the lungs 2 (two) times daily. 04/12/14  Yes Tanda Rockers, MD  calcium citrate (CALCITRATE - DOSED IN MG ELEMENTAL CALCIUM) 950 MG tablet Take 200 mg of elemental calcium by mouth daily.   Yes Historical Provider, MD  cholecalciferol (VITAMIN D) 1000 UNITS tablet Take 1,000 Units by mouth daily.   Yes Historical Provider, MD  cyanocobalamin (,VITAMIN B-12,) 1000 MCG/ML injection Inject 1 mL (1,000 mcg total) into the muscle once a week. 08/24/14  Yes Albertine Patricia, MD  famotidine (PEPCID) 20 MG tablet Take 20 mg by mouth daily.   Yes Historical Provider, MD  feeding  supplement, ENSURE ENLIVE, (ENSURE ENLIVE) LIQD Take 237 mLs by mouth 2 (two) times daily between meals. 08/24/14  Yes Albertine Patricia, MD  folic acid (FOLVITE) 1 MG tablet Take 1 tablet (1 mg total) by mouth daily. 08/24/14  Yes Albertine Patricia, MD  furosemide (LASIX) 40 MG tablet Take 40 mg by mouth daily.   Yes Historical Provider, MD  gabapentin (NEURONTIN) 300 MG capsule Take 2 capsules (600 mg total) by mouth 2 (two) times daily. 02/05/14  Yes Orson Eva, MD  glimepiride (AMARYL) 2 MG tablet Take 2 mg by mouth daily with breakfast.   Yes Historical Provider, MD  hydrOXYzine (ATARAX/VISTARIL) 10 MG tablet Take 10 mg by mouth every 12 (twelve) hours as needed.   Yes Historical Provider, MD  ipratropium-albuterol (DUONEB) 0.5-2.5 (3) MG/3ML SOLN Take 3 mLs by nebulization every 6 (six) hours as needed.    Yes Historical Provider, MD  losartan (COZAAR) 50 MG tablet Take 25 mg by mouth daily.  03/23/14  Yes Historical Provider, MD  Melatonin 3 MG TABS Take 3 mg by mouth at bedtime.   Yes Historical Provider, MD  metFORMIN (GLUCOPHAGE) 1000 MG tablet Take 1 tablet (1,000 mg total) by mouth 2 (two) times daily with a meal. 01/06/14  Yes Brittainy Erie Noe, PA-C  nitroGLYCERIN (NITROSTAT) 0.4 MG SL tablet Place 1 tablet (0.4 mg total) under the tongue every 5 (five) minutes as needed for chest pain. 01/02/14  Yes Darlin Coco, MD  pantoprazole (PROTONIX) 40 MG tablet Take 40 mg by mouth every morning.    Yes Historical Provider, MD  rivaroxaban (XARELTO) 20 MG TABS tablet Take 1 tablet (20 mg total) by mouth daily with supper. 08/24/14  Yes Silver Huguenin Elgergawy, MD  rosuvastatin (CRESTOR) 5 MG tablet Take 5 mg by mouth at bedtime.   Yes Historical Provider, MD  Spacer/Aero-Holding Chambers (AEROCHAMBER MV) inhaler Use as instructed 03/14/14  Yes Tanda Rockers, MD    Allergies  Allergen Reactions  . Codeine Nausea And Vomiting  . Demerol Other (See Comments)    Drunk feeling"  . Doxycycline  Nausea And Vomiting  . Statins Other (See Comments)    "hurt"   . Zetia [Ezetimibe] Other (See Comments)    hurt    Physical Exam  Vitals  Blood pressure 124/91, pulse 81, temperature 97.5 F (36.4 C), temperature source Oral, resp. rate 23, height 5\' 5"  (1.651 m), weight 99.791 kg (220 lb), SpO2 97 %.   General: Obese female alert and oriented lying in bed in NAD.    Psych:  Normal affect and insight, Not Suicidal or Homicidal, Awake Alert, Oriented X 3.  Neuro:   No F.N deficits, ALL C.Nerves Intact, Strength 5/5 all 4 extremities, Sensation intact all 4 extremities.  ENT:  Unable to open right eye,. Orbital area and eyelids are swollen with ecchymosis, minimal amount of blood weeping between eyelids. No exudates or erythema in oropharynx  Neck:  Supple, thick, No lymphadenopathy appreciated  Respiratory:  Decreased breath sounds, minimally increased work of breathing, no frank crackles or rales  Cardiac:  RRR, No Murmurs, 1+ lower extremity edema bilaterally, no JVD appreciated  Abdomen:  Positive bowel sounds, Soft, Non tender, Non distended,  No masses appreciated  Skin:  Ecchymosis and swelling over right eye, small bruises on right shoulder, redness and irritation in pannicular fold  Extremities:  Able to move all 4. 5/5 strength in each,  no effusions.  Data Review  CBC  Recent Labs Lab 09/11/14 0825  WBC 12.8*  HGB 9.6*  HCT 32.0*  PLT 296  MCV 88.9  MCH 26.7  MCHC 30.0  RDW  15.6*  LYMPHSABS 1.5  MONOABS 1.0  EOSABS 0.2  BASOSABS 0.0    Chemistries   Recent Labs Lab 09/11/14 0825  NA 135  K 5.8*  CL 102  CO2 25  GLUCOSE 113*  BUN 85*  CREATININE 1.85*  CALCIUM 8.9     Cardiac Enzymes - Pending.   Imaging results:   Ct Head Wo Contrast  09/11/2014   CLINICAL DATA:  81 year old fell from bed to night stand. Swelling to the right orbit and cheek bone. Headache.  EXAM: CT HEAD WITHOUT CONTRAST  CT MAXILLOFACIAL WITHOUT CONTRAST  CT  CERVICAL SPINE WITHOUT CONTRAST  TECHNIQUE: Multidetector CT imaging of the head, cervical spine, and maxillofacial structures were performed using the standard protocol without intravenous contrast. Multiplanar CT image reconstructions of the cervical spine and maxillofacial structures were also generated.  COMPARISON:  08/24/2014  FINDINGS: CT HEAD FINDINGS  There is some motion artifact. There is stable mild cerebral atrophy. Stable subtle low-density in the white matter. No evidence for acute hemorrhage, mass lesion, midline shift, hydrocephalus or large infarct. Large amount of swelling in the right periorbital region, right cheek and right forehead. Mild mucosal thickening in the sphenoid sinuses. No evidence for a calvarial fracture.  CT MAXILLOFACIAL FINDINGS  Soft tissue swelling involving the right cheek, right periorbital region and right forehead. Globes are intact. Mandible is intact. The mandibular condyles are located. Mild mucosal thickening in the sphenoid sinuses. No evidence for an acute facial bone fracture. Specifically, the pterygoid plates are intact. Degenerative facet disease in the cervical spine. Incidentally, there is periapical lucency involving the right lower second molar. The lucency or caries involving the right lower canine tooth.  CT CERVICAL SPINE FINDINGS  Limited evaluation of the lung apices. Negative for fracture or dislocation in cervical spine. No soft tissue swelling in the neck. Bilateral facet disease in the cervical spine. Bone detail in the lower neck at the cervicothoracic junction is limited. Overall alignment of cervical spine is normal. There is disc space narrowing at C6-C7 with bridging anterior osteophytes.  IMPRESSION: No acute intracranial abnormality. Mild atrophy and evidence for chronic small vessel ischemic changes.  Extensive soft tissue swelling on the right side of the face. No underlying fracture.  Multilevel degenerative changes in the cervical spine  without acute bone abnormality.   Electronically Signed   By: Markus Daft M.D.   On: 09/11/2014 09:24   Ct Head Wo Contrast  08/24/2014   CLINICAL DATA:  81 year old female with altered mental status. Lethargy.  EXAM: CT HEAD WITHOUT CONTRAST  TECHNIQUE: Contiguous axial images were obtained from the base of the skull through the vertex without intravenous contrast.  COMPARISON:  No priors.  FINDINGS: Mild cerebral atrophy. Patchy and confluent areas of decreased attenuation are noted throughout the deep and periventricular white matter of the cerebral hemispheres bilaterally, compatible with chronic microvascular ischemic disease. No acute intracranial abnormalities. Specifically, no evidence of acute intracranial hemorrhage, no definite findings of acute/subacute cerebral ischemia, no mass, mass effect, hydrocephalus or abnormal intra or extra-axial fluid collections. Visualized paranasal sinuses and mastoids are generally well pneumatized, with exception of extensive mucosal thickening in the sphenoid sinuses. No acute displaced skull fractures are identified.  IMPRESSION: 1. No acute intracranial abnormalities. 2. Mild cerebral atrophy and chronic microvascular ischemic changes in the cerebral white matter. 3. Extensive mucosal thickening in the sphenoid sinuses, extensive mucosal thickening in sphenoid sinuses. No air-fluid levels to suggest an acute sinusitis at this time.   Electronically  Signed   By: Vinnie Langton M.D.   On: 08/24/2014 21:34   Ct Cervical Spine Wo Contrast  09/11/2014   CLINICAL DATA:  81 year old fell from bed to night stand. Swelling to the right orbit and cheek bone. Headache.  EXAM: CT HEAD WITHOUT CONTRAST  CT MAXILLOFACIAL WITHOUT CONTRAST  CT CERVICAL SPINE WITHOUT CONTRAST  TECHNIQUE: Multidetector CT imaging of the head, cervical spine, and maxillofacial structures were performed using the standard protocol without intravenous contrast. Multiplanar CT image reconstructions  of the cervical spine and maxillofacial structures were also generated.  COMPARISON:  08/24/2014  FINDINGS: CT HEAD FINDINGS  There is some motion artifact. There is stable mild cerebral atrophy. Stable subtle low-density in the white matter. No evidence for acute hemorrhage, mass lesion, midline shift, hydrocephalus or large infarct. Large amount of swelling in the right periorbital region, right cheek and right forehead. Mild mucosal thickening in the sphenoid sinuses. No evidence for a calvarial fracture.  CT MAXILLOFACIAL FINDINGS  Soft tissue swelling involving the right cheek, right periorbital region and right forehead. Globes are intact. Mandible is intact. The mandibular condyles are located. Mild mucosal thickening in the sphenoid sinuses. No evidence for an acute facial bone fracture. Specifically, the pterygoid plates are intact. Degenerative facet disease in the cervical spine. Incidentally, there is periapical lucency involving the right lower second molar. The lucency or caries involving the right lower canine tooth.  CT CERVICAL SPINE FINDINGS  Limited evaluation of the lung apices. Negative for fracture or dislocation in cervical spine. No soft tissue swelling in the neck. Bilateral facet disease in the cervical spine. Bone detail in the lower neck at the cervicothoracic junction is limited. Overall alignment of cervical spine is normal. There is disc space narrowing at C6-C7 with bridging anterior osteophytes.  IMPRESSION: No acute intracranial abnormality. Mild atrophy and evidence for chronic small vessel ischemic changes.  Extensive soft tissue swelling on the right side of the face. No underlying fracture.  Multilevel degenerative changes in the cervical spine without acute bone abnormality.   Electronically Signed   By: Markus Daft M.D.   On: 09/11/2014 09:24   Mr Brain Wo Contrast  08/21/2014   CLINICAL DATA:  Mental status changes. Rapid atrial fibrillation. Confusion. Memory loss.  EXAM:  MRI HEAD WITHOUT CONTRAST  TECHNIQUE: Multiplanar, multiecho pulse sequences of the brain and surrounding structures were obtained without intravenous contrast.  COMPARISON:  None.  FINDINGS: The diffusion-weighted images demonstrate no evidence for acute or subacute infarction.  The study is moderately degraded by patient motion. Moderate atrophy and periventricular white matter change bilaterally is somewhat advanced for age. No acute infarct, hemorrhage, or mass lesion is present. The ventricles are of normal size. No significant extra-axial fluid collections are present.  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 White matter changes extend into the brainstem.  Flow is present in the major intracranial arteries. Bilateral lens replacements are present. Circumferential mucosal thickening is present in the posterior sphenoid sinus bilaterally. The remaining paranasal sinuses and the mastoid air cells are clear.  Midline structures are within normal limits.  IMPRESSION: 1. No acute intracranial abnormality. 2. Age advanced atrophy and white matter disease. This likely reflects the sequela of chronic microvascular ischemia.   Electronically Signed   By: San Morelle M.D.   On: 08/21/2014 14:28   US Renal  08/21/2014   CLINICAL DATA:  81 year old female with acute renal injury. Initial encounter.  EXAM: RENAL / URINARY TRACT ULTRASOUND  COMPLETE  COMPARISON:  Lumbar spine MRI 01/11/2013.  Chest CTA 02/01/2014.  FINDINGS: Right Kidney:  Length: 10.6 cm. No hydronephrosis. Cortical echogenicity within normal limits. No right renal mass.  Left Kidney:  Length: 10.1 cm. No hydronephrosis. Cortical echotexture within normal limits. No left renal mass.  Bladder:  Decompressed, Foley catheter balloon visible.  IMPRESSION: No hydronephrosis or acute renal findings.   Electronically Signed   By: Genevie Ann M.D.   On: 08/21/2014 16:13   Dg Chest Port 1 View  08/24/2014   CLINICAL DATA:  Pt c/o being  "tired and cold" x1 day. Pt was released from [facility] this morning after being treated for A.Fib.Pt denies pain and SOB.Hx of HTN, DM, mild aortic stenosis, PNA, bronchitis.Nonsmoker.  EXAM: PORTABLE CHEST - 1 VIEW  COMPARISON:  08/18/2014  FINDINGS: Mild enlargement of the cardiac silhouette. No mediastinal or hilar masses or evidence of adenopathy.  Clear lungs.  No pleural effusion or pneumothorax.  Bony thorax is demineralized but grossly intact.  IMPRESSION: No active disease.   Electronically Signed   By: Lajean Manes M.D.   On: 08/24/2014 19:32   Dg Chest Port 1 View  08/18/2014   CLINICAL DATA:  Shortness of breath today with exertion.  EXAM: PORTABLE CHEST - 1 VIEW  COMPARISON:  03/31/2014  FINDINGS: Heart is borderline in size. No confluent airspace opacities, effusions or edema. No acute bony abnormality.  IMPRESSION: No active disease.   Electronically Signed   By: Rolm Baptise M.D.   On: 08/18/2014 11:39   Ct Maxillofacial Wo Cm  09/11/2014   CLINICAL DATA:  80 year old fell from bed to night stand. Swelling to the right orbit and cheek bone. Headache.  EXAM: CT HEAD WITHOUT CONTRAST  CT MAXILLOFACIAL WITHOUT CONTRAST  CT CERVICAL SPINE WITHOUT CONTRAST  TECHNIQUE: Multidetector CT imaging of the head, cervical spine, and maxillofacial structures were performed using the standard protocol without intravenous contrast. Multiplanar CT image reconstructions of the cervical spine and maxillofacial structures were also generated.  COMPARISON:  08/24/2014  FINDINGS: CT HEAD FINDINGS  There is some motion artifact. There is stable mild cerebral atrophy. Stable subtle low-density in the white matter. No evidence for acute hemorrhage, mass lesion, midline shift, hydrocephalus or large infarct. Large amount of swelling in the right periorbital region, right cheek and right forehead. Mild mucosal thickening in the sphenoid sinuses. No evidence for a calvarial fracture.  CT MAXILLOFACIAL FINDINGS  Soft  tissue swelling involving the right cheek, right periorbital region and right forehead. Globes are intact. Mandible is intact. The mandibular condyles are located. Mild mucosal thickening in the sphenoid sinuses. No evidence for an acute facial bone fracture. Specifically, the pterygoid plates are intact. Degenerative facet disease in the cervical spine. Incidentally, there is periapical lucency involving the right lower second molar. The lucency or caries involving the right lower canine tooth.  CT CERVICAL SPINE FINDINGS  Limited evaluation of the lung apices. Negative for fracture or dislocation in cervical spine. No soft tissue swelling in the neck. Bilateral facet disease in the cervical spine. Bone detail in the lower neck at the cervicothoracic junction is limited. Overall alignment of cervical spine is normal. There is disc space narrowing at C6-C7 with bridging anterior osteophytes.  IMPRESSION: No acute intracranial abnormality. Mild atrophy and evidence for chronic small vessel ischemic changes.  Extensive soft tissue swelling on the right side of the face. No underlying fracture.  Multilevel degenerative changes in the cervical spine without acute bone  abnormality.   Electronically Signed   By: Markus Daft M.D.   On: 09/11/2014 09:24    My personal review of EKG: atrial fib at 117.   Assessment & Plan  Principal Problem:   Atrial fibrillation with RVR Active Problems:   Diabetic peripheral neuropathy associated with type 2 diabetes mellitus   Type II diabetes mellitus   COPD GOLD II    Chronic low back pain   Severe obesity (BMI >= 40)   AKI (acute kidney injury)   Subconjunctival hemorrhage of right eye   Afib with RVR Patient was cardioverted on 08/23/14.  Current EKG shows atrial fibrillation with a rate of 117. Denies chest pain.   Will cycle troponins.  Check TSH.  2D echo was done on 08/29/14 She was anticoagulated with Alen Blew however due to decreased GFR we will stop Xeralto  and place on heparin drip.  We will continue Zebeta.   Appreciate cardiology recommendations regarding both A. fib with RVR and anticoagulation.  Acute Kidney Injury in the setting of chronic renal insufficiency Baseline creatinine appears to be 1.3.  Likely due to dehydration from poor by mouth intake. Will hold Cozaar and Lasix. Check a.m. Bmet. Hesitant to give additional fluids after 500 ml bolus received in the ER due to possible diastolic heart failure  Subconjunctival hemorrhage with possible scleral laceration Supportive treatment with ice packs.  This was discussed with the ophthalmologist on-call (Dr. Valetta Close).  He will see the patient and formally consult.  Leukocytosis Likely due to fall and stress margination. Patient shows no signs of acute infection.  Panniculitis Probable yeast infection of the skin in pannus. Nystatin cream has been ordered.  Hyperkalemia 5.8.  Will recheck bmet. Likely due to decreased kidney function.   Holding cozaar.  COPD No active exacerbation.  Will continue symbicort, albuterol and duonebs as she is on at home.    Diabetes Mellitus Holding amaryl and metformin. Will start low-dose Lantus and sliding scale NovoLog  Peripheral neuropathy Given patient's fall and decreased kidney function we have decreased her dose of gabapentin from 600 mg twice a day to 300 mg twice a day  Morbid obesity Patient is on ensure at home.   HTN Continue Zebeta.  Holding losartan.   Consultants Called:  Cardiology - For anticoagulation and afib with RVR  Family Communication:   Patient is alert and orientated.  Code Status:  DNR  Condition:  Guarded.  Potential Disposition:  To be determined based on further work up - Home with Home health vs SNF  Time spent in minutes : [id number],  PA-C on 09/11/2014 at 11:43 AM Between 7am to 7pm - Pager - [phone] After 7pm go to [email or web address] - password [email or web address] And look for the night coverage person  covering me after hours  [facility]

## 2014-09-11 NOTE — ED Notes (Signed)
TonoPen at bedside.

## 2014-09-11 NOTE — Consult Note (Signed)
OPHTHALMOLOGY CONSULT NOTE  Date: 09/11/14 Time: 12:22 PM  Patient Name: [name] [name]  DOB: 08-06-1930 MRN: [id number]  Reason for Consult: Eye Injury    HPI:  This is a 81 y.o. WF being admitted for AFIB with RPR for who ophthalmology was consulted due to eye injury. The patient reportedly fell from her bed and hit her face on her night stand. There was concern for scleral laceration, but no bony orbit fractures were noted.    Prior to Admission medications   Medication Sig Start Date End Date Taking? Authorizing Provider  acetaminophen (TYLENOL) 325 MG tablet Take 2 tablets (650 mg total) by mouth every 6 (six) hours as needed for mild pain (or Fever >/= 101). 08/24/14  Yes Albertine Patricia, MD  albuterol (PROVENTIL HFA;VENTOLIN HFA) 108 (90 BASE) MCG/ACT inhaler Inhale 1 puff into the lungs every 6 (six) hours as needed for wheezing or shortness of breath.   Yes Historical Provider, MD  BIOTIN PO Take 1 tablet by mouth daily. 1,000 mg daily   Yes Historical Provider, MD  bisoprolol (ZEBETA) 5 MG tablet Take 1.5 tablets (7.5 mg total) by mouth daily. 08/24/14  Yes Deno Etienne, DO  budesonide-formoterol (SYMBICORT) 80-4.5 MCG/ACT inhaler Inhale 2 puffs into the lungs 2 (two) times daily. 04/12/14  Yes Tanda Rockers, MD  calcium citrate (CALCITRATE - DOSED IN MG ELEMENTAL CALCIUM) 950 MG tablet Take 200 mg of elemental calcium by mouth daily.   Yes Historical Provider, MD  cholecalciferol (VITAMIN D) 1000 UNITS tablet Take 1,000 Units by mouth daily.   Yes Historical Provider, MD  cyanocobalamin (,VITAMIN B-12,) 1000 MCG/ML injection Inject 1 mL (1,000 mcg total) into the muscle once a week. 08/24/14  Yes Albertine Patricia, MD  famotidine (PEPCID) 20 MG tablet Take 20 mg by mouth daily.   Yes Historical Provider, MD  feeding supplement, ENSURE ENLIVE, (ENSURE ENLIVE) LIQD Take 237 mLs by mouth 2 (two) times daily between meals. 08/24/14  Yes Albertine Patricia, MD  folic acid (FOLVITE) 1 MG  tablet Take 1 tablet (1 mg total) by mouth daily. 08/24/14  Yes Albertine Patricia, MD  furosemide (LASIX) 40 MG tablet Take 40 mg by mouth daily.   Yes Historical Provider, MD  gabapentin (NEURONTIN) 300 MG capsule Take 2 capsules (600 mg total) by mouth 2 (two) times daily. 02/05/14  Yes Orson Eva, MD  glimepiride (AMARYL) 2 MG tablet Take 2 mg by mouth daily with breakfast.   Yes Historical Provider, MD  hydrOXYzine (ATARAX/VISTARIL) 10 MG tablet Take 10 mg by mouth every 12 (twelve) hours as needed.   Yes Historical Provider, MD  ipratropium-albuterol (DUONEB) 0.5-2.5 (3) MG/3ML SOLN Take 3 mLs by nebulization every 6 (six) hours as needed.    Yes Historical Provider, MD  losartan (COZAAR) 50 MG tablet Take 25 mg by mouth daily.  03/23/14  Yes Historical Provider, MD  Melatonin 3 MG TABS Take 3 mg by mouth at bedtime.   Yes Historical Provider, MD  metFORMIN (GLUCOPHAGE) 1000 MG tablet Take 1 tablet (1,000 mg total) by mouth 2 (two) times daily with a meal. 01/06/14  Yes Brittainy Erie Noe, PA-C  nitroGLYCERIN (NITROSTAT) 0.4 MG SL tablet Place 1 tablet (0.4 mg total) under the tongue every 5 (five) minutes as needed for chest pain. 01/02/14  Yes Darlin Coco, MD  pantoprazole (PROTONIX) 40 MG tablet Take 40 mg by mouth every morning.    Yes Historical Provider, MD  rivaroxaban (XARELTO) 20 MG  TABS tablet Take 1 tablet (20 mg total) by mouth daily with supper. 08/24/14  Yes Silver Huguenin Elgergawy, MD  rosuvastatin (CRESTOR) 5 MG tablet Take 5 mg by mouth at bedtime.   Yes Historical Provider, MD  Spacer/Aero-Holding Chambers (AEROCHAMBER MV) inhaler Use as instructed 03/14/14  Yes Tanda Rockers, MD    Past Medical History  Diagnosis Date  . Mild aortic stenosis     a. 10/2010 Echo: Ef 60-65%, no rwma, mild LVH no AS;  b. 08/2014 Echo: mild to mod AS.  Marland Kitchen Chronic back pain   . Peripheral neuropathy   . GERD (gastroesophageal reflux disease)   . Essential hypertension   . History of pneumonia    . History of bronchitis   . Basal cell carcinoma     a. 2013.  . Non-cardiac chest pain     a. 12/2013 LHC w/ nl coronaries and nl EF 60%.  . Frequent PAC's   . Heart murmur   . Type II diabetes mellitus   . Atrial fibrillation with RVR     a. 08/2014 s/p TEE/DCCV;  b. CHA2DS2VASc = 5-->Xarelto.  . Chronic diastolic CHF (congestive heart failure)     a. 08/2014 Echo: EF 55-60%, mild to mod AS, mild MR, mod dil LA/RA, mildly dil RV, PASP 31mmHg.  . Morbid obesity     family history includes COPD in her father; Heart failure in her father; Irregular heart beat in her sister; Other in her mother.  Social History   Occupational History  . Retired    Social History Main Topics  . Smoking status: Former Smoker -- 0.50 packs/day for 5 years    Types: Cigarettes    Quit date: 01/14/1980  . Smokeless tobacco: Never Used  . Alcohol Use: No  . Drug Use: No  . Sexual Activity: Not on file    Allergies  Allergen Reactions  . Codeine Nausea And Vomiting  . Demerol Other (See Comments)    Drunk feeling"  . Doxycycline Nausea And Vomiting  . Statins Other (See Comments)    "hurt"   . Zetia [Ezetimibe] Other (See Comments)    hurt    ROS: Positive as above, otherwise negative.  EXAM:  Mental Status: A&O x 3   Base Exam: Right Eye Left Eye  Visual Acuity (At near +2.0) 20/60  20/30  IOP (Tonopen) 20   Pupillary Exam No RAPD  No RAPD  Motility Full  Full   Confrontation VF Limited by edema Full    Anterior Segment Exam    Lids/Lashes Periorbital edema/eccymosis dermatocholasis  Conjuctiva Temporal bullous [place] White and Quiet  Cornea Clear Clear  Anterior Chamber Deep and Quiet Deep and Quiet  Iris Round, Reactive Round, Reactive  Lens PCIOL PCIOL  Vitreous WNL WNL   Poster Segment Exam Dilated OD only 12:40 1% phenylephrine/Tropicamide UDFE  Disc Sharp Sharp  CD ratio    Macula Flat Flat  Vessels WNL WNL  Periphery Attached    Radiographic Studies Reviewed: Ct  Head Wo Contrast  09/11/2014   CLINICAL DATA:  81 year old fell from bed to night stand. Swelling to the right orbit and cheek bone. Headache.  EXAM: CT HEAD WITHOUT CONTRAST  CT MAXILLOFACIAL WITHOUT CONTRAST  CT CERVICAL SPINE WITHOUT CONTRAST  TECHNIQUE: Multidetector CT imaging of the head, cervical spine, and maxillofacial structures were performed using the standard protocol without intravenous contrast. Multiplanar CT image reconstructions of the cervical spine and maxillofacial structures were also generated.  COMPARISON:  08/24/2014  FINDINGS: CT HEAD FINDINGS  There is some motion artifact. There is stable mild cerebral atrophy. Stable subtle low-density in the white matter. No evidence for acute hemorrhage, mass lesion, midline shift, hydrocephalus or large infarct. Large amount of swelling in the right periorbital region, right cheek and right forehead. Mild mucosal thickening in the sphenoid sinuses. No evidence for a calvarial fracture.  CT MAXILLOFACIAL FINDINGS  Soft tissue swelling involving the right cheek, right periorbital region and right forehead. Globes are intact. Mandible is intact. The mandibular condyles are located. Mild mucosal thickening in the sphenoid sinuses. No evidence for an acute facial bone fracture. Specifically, the pterygoid plates are intact. Degenerative facet disease in the cervical spine. Incidentally, there is periapical lucency involving the right lower second molar. The lucency or caries involving the right lower canine tooth.  CT CERVICAL SPINE FINDINGS  Limited evaluation of the lung apices. Negative for fracture or dislocation in cervical spine. No soft tissue swelling in the neck. Bilateral facet disease in the cervical spine. Bone detail in the lower neck at the cervicothoracic junction is limited. Overall alignment of cervical spine is normal. There is disc space narrowing at C6-C7 with bridging anterior osteophytes.  IMPRESSION: No acute intracranial  abnormality. Mild atrophy and evidence for chronic small vessel ischemic changes.  Extensive soft tissue swelling on the right side of the face. No underlying fracture.  Multilevel degenerative changes in the cervical spine without acute bone abnormality.   Electronically Signed   By: Markus Daft M.D.   On: 09/11/2014 09:24   Ct Cervical Spine Wo Contrast  09/11/2014   CLINICAL DATA:  81 year old fell from bed to night stand. Swelling to the right orbit and cheek bone. Headache.  EXAM: CT HEAD WITHOUT CONTRAST  CT MAXILLOFACIAL WITHOUT CONTRAST  CT CERVICAL SPINE WITHOUT CONTRAST  TECHNIQUE: Multidetector CT imaging of the head, cervical spine, and maxillofacial structures were performed using the standard protocol without intravenous contrast. Multiplanar CT image reconstructions of the cervical spine and maxillofacial structures were also generated.  COMPARISON:  08/24/2014  FINDINGS: CT HEAD FINDINGS  There is some motion artifact. There is stable mild cerebral atrophy. Stable subtle low-density in the white matter. No evidence for acute hemorrhage, mass lesion, midline shift, hydrocephalus or large infarct. Large amount of swelling in the right periorbital region, right cheek and right forehead. Mild mucosal thickening in the sphenoid sinuses. No evidence for a calvarial fracture.  CT MAXILLOFACIAL FINDINGS  Soft tissue swelling involving the right cheek, right periorbital region and right forehead. Globes are intact. Mandible is intact. The mandibular condyles are located. Mild mucosal thickening in the sphenoid sinuses. No evidence for an acute facial bone fracture. Specifically, the pterygoid plates are intact. Degenerative facet disease in the cervical spine. Incidentally, there is periapical lucency involving the right lower second molar. The lucency or caries involving the right lower canine tooth.  CT CERVICAL SPINE FINDINGS  Limited evaluation of the lung apices. Negative for fracture or dislocation in  cervical spine. No soft tissue swelling in the neck. Bilateral facet disease in the cervical spine. Bone detail in the lower neck at the cervicothoracic junction is limited. Overall alignment of cervical spine is normal. There is disc space narrowing at C6-C7 with bridging anterior osteophytes.  IMPRESSION: No acute intracranial abnormality. Mild atrophy and evidence for chronic small vessel ischemic changes.  Extensive soft tissue swelling on the right side of the face. No underlying fracture.  Multilevel degenerative changes in the cervical spine  without acute bone abnormality.   Electronically Signed   By: Markus Daft M.D.   On: 09/11/2014 09:24   Ct Maxillofacial Wo Cm  09/11/2014   CLINICAL DATA:  81 year old fell from bed to night stand. Swelling to the right orbit and cheek bone. Headache.  EXAM: CT HEAD WITHOUT CONTRAST  CT MAXILLOFACIAL WITHOUT CONTRAST  CT CERVICAL SPINE WITHOUT CONTRAST  TECHNIQUE: Multidetector CT imaging of the head, cervical spine, and maxillofacial structures were performed using the standard protocol without intravenous contrast. Multiplanar CT image reconstructions of the cervical spine and maxillofacial structures were also generated.  COMPARISON:  08/24/2014  FINDINGS: CT HEAD FINDINGS  There is some motion artifact. There is stable mild cerebral atrophy. Stable subtle low-density in the white matter. No evidence for acute hemorrhage, mass lesion, midline shift, hydrocephalus or large infarct. Large amount of swelling in the right periorbital region, right cheek and right forehead. Mild mucosal thickening in the sphenoid sinuses. No evidence for a calvarial fracture.  CT MAXILLOFACIAL FINDINGS  Soft tissue swelling involving the right cheek, right periorbital region and right forehead. Globes are intact. Mandible is intact. The mandibular condyles are located. Mild mucosal thickening in the sphenoid sinuses. No evidence for an acute facial bone fracture. Specifically, the  pterygoid plates are intact. Degenerative facet disease in the cervical spine. Incidentally, there is periapical lucency involving the right lower second molar. The lucency or caries involving the right lower canine tooth.  CT CERVICAL SPINE FINDINGS  Limited evaluation of the lung apices. Negative for fracture or dislocation in cervical spine. No soft tissue swelling in the neck. Bilateral facet disease in the cervical spine. Bone detail in the lower neck at the cervicothoracic junction is limited. Overall alignment of cervical spine is normal. There is disc space narrowing at C6-C7 with bridging anterior osteophytes.  IMPRESSION: No acute intracranial abnormality. Mild atrophy and evidence for chronic small vessel ischemic changes.  Extensive soft tissue swelling on the right side of the face. No underlying fracture.  Multilevel degenerative changes in the cervical spine without acute bone abnormality.   Electronically Signed   By: Markus Daft M.D.   On: 09/11/2014 09:24    Assessment and Recommendation: Subconjunctival hemorrhage: Erythromycin ointment tid until discharge. Follow with Dr. Owens Shark in [facility] after discharge, please arrange.     -Globe intact     -Pt denies changes in vision     -No bony fractures noted     Please call with any questions.  Jola Schmidt MD [facility]hthal[facility] [phone]

## 2014-09-11 NOTE — ED Notes (Signed)
Patient placed in [place] [facility] for eye exam with opthamologist. Patient made aware of the plan of care. MD made aware to contact nurse for further care.

## 2014-09-11 NOTE — Progress Notes (Signed)
0600 dr . Meda Coffee in aware of [place] . the patient with lapses of confusion with orders

## 2014-09-11 NOTE — ED Notes (Signed)
CBG 91 

## 2014-09-11 NOTE — ED Notes (Signed)
MD Eulis Foster updating family.

## 2014-09-12 ENCOUNTER — Ambulatory Visit: Payer: Medicare HMO | Admitting: Internal Medicine

## 2014-09-12 ENCOUNTER — Inpatient Hospital Stay (HOSPITAL_COMMUNITY): Payer: Medicare HMO

## 2014-09-12 ENCOUNTER — Telehealth: Payer: Self-pay | Admitting: Cardiology

## 2014-09-12 DIAGNOSIS — I5033 Acute on chronic diastolic (congestive) heart failure: Secondary | ICD-10-CM

## 2014-09-12 LAB — OSMOLALITY: OSMOLALITY: 328 mosm/kg — AB (ref 275–300)

## 2014-09-12 LAB — BASIC METABOLIC PANEL
ANION GAP: 11 (ref 5–15)
ANION GAP: 12 (ref 5–15)
BUN: 84 mg/dL — ABNORMAL HIGH (ref 6–20)
BUN: 89 mg/dL — ABNORMAL HIGH (ref 6–20)
CALCIUM: 8.5 mg/dL — AB (ref 8.9–10.3)
CO2: 23 mmol/L (ref 22–32)
CO2: 21 mmol/L — ABNORMAL LOW (ref 22–32)
Calcium: 8.5 mg/dL — ABNORMAL LOW (ref 8.9–10.3)
Chloride: 99 mmol/L — ABNORMAL LOW (ref 101–111)
Chloride: 102 mmol/L (ref 101–111)
Creatinine, Ser: 2 mg/dL — ABNORMAL HIGH (ref 0.44–1.00)
Creatinine, Ser: 2.42 mg/dL — ABNORMAL HIGH (ref 0.44–1.00)
GFR calc Af Amer: 26 mL/min — ABNORMAL LOW (ref >60)
GFR, EST AFRICAN AMERICAN: 20 mL/min — AB (ref >60)
GFR, EST NON AFRICAN AMERICAN: 22 mL/min — AB (ref >60)
GFR, EST NON AFRICAN AMERICAN: 18 mL/min — AB (ref >60)
GLUCOSE: 251 mg/dL — AB (ref 65–99)
GLUCOSE: 237 mg/dL — AB (ref 65–99)
POTASSIUM: 5.7 mmol/L — AB (ref 3.5–5.1)
POTASSIUM: 5.6 mmol/L — AB (ref 3.5–5.1)
Sodium: 133 mmol/L — ABNORMAL LOW (ref 135–145)
Sodium: 135 mmol/L (ref 135–145)

## 2014-09-12 LAB — APTT
APTT: 120 s — AB (ref 24–37)
aPTT: 57 seconds — ABNORMAL HIGH (ref 24–37)
aPTT: 159 seconds — ABNORMAL HIGH (ref 24–37)

## 2014-09-12 LAB — GLUCOSE, CAPILLARY
GLUCOSE-CAPILLARY: 246 mg/dL — AB (ref 65–99)
GLUCOSE-CAPILLARY: 183 mg/dL — AB (ref 65–99)
Glucose-Capillary: 171 mg/dL — ABNORMAL HIGH (ref 65–99)
Glucose-Capillary: 174 mg/dL — ABNORMAL HIGH (ref 65–99)

## 2014-09-12 LAB — HEMOGLOBIN A1C
Hgb A1c MFr Bld: 9.3 % — ABNORMAL HIGH (ref 4.8–5.6)
Mean Plasma Glucose: 220 mg/dL

## 2014-09-12 LAB — CBC
HEMATOCRIT: 32.8 % — AB (ref 36.0–46.0)
HEMOGLOBIN: 9.7 g/dL — AB (ref 12.0–15.0)
MCH: 26.7 pg (ref 26.0–34.0)
MCHC: 29.6 g/dL — ABNORMAL LOW (ref 30.0–36.0)
MCV: 90.4 fL (ref 78.0–100.0)
Platelets: 302 10*3/uL (ref 150–400)
RBC: 3.63 MIL/uL — ABNORMAL LOW (ref 3.87–5.11)
RDW: 16.1 % — ABNORMAL HIGH (ref 11.5–15.5)
WBC: 11.3 10*3/uL — AB (ref 4.0–10.5)

## 2014-09-12 LAB — HEPARIN LEVEL (UNFRACTIONATED): Heparin Unfractionated: >2.2 IU/mL — ABNORMAL HIGH (ref 0.30–0.70)

## 2014-09-12 LAB — TROPONIN I: TROPONIN I: 0.03 ng/mL (ref <0.031)

## 2014-09-12 MED ORDER — MORPHINE SULFATE (PF) 2 MG/ML IV SOLN
0.5000 mg | INTRAVENOUS | Status: DC | PRN
  Administered 2014-09-12: 0.5 mg via INTRAVENOUS
  Filled 2014-09-12: qty 1

## 2014-09-12 MED ORDER — SODIUM POLYSTYRENE SULFONATE 15 GM/60ML PO SUSP
30.0000 g | Freq: Once | ORAL | Status: AC
Start: 2014-09-12 — End: 2014-09-12
  Administered 2014-09-12: 30 g via ORAL
  Filled 2014-09-12: qty 120

## 2014-09-12 MED ORDER — HEPARIN (PORCINE) IN NACL 100-0.45 UNIT/ML-% IJ SOLN
900.0000 [IU]/h | INTRAMUSCULAR | Status: DC
  Administered 2014-09-13: 800 [IU]/h via INTRAVENOUS
  Administered 2014-09-14: 850 [IU]/h via INTRAVENOUS
  Filled 2014-09-12: qty 250

## 2014-09-12 MED ORDER — MUPIROCIN 2 % EX OINT
1.0000 | TOPICAL_OINTMENT | Freq: Two times a day (BID) | CUTANEOUS | Status: DC
Start: 2014-09-12 — End: 2014-09-15
  Administered 2014-09-12 – 2014-09-14 (×6): 1 via NASAL
  Filled 2014-09-12 (×2): qty 22

## 2014-09-12 MED ORDER — SODIUM CHLORIDE 0.9 % IV SOLN: INTRAVENOUS | Status: AC

## 2014-09-12 MED ORDER — SODIUM POLYSTYRENE SULFONATE 15 GM/60ML PO SUSP
30.0000 g | Freq: Once | ORAL | Status: AC
  Administered 2014-09-12: 30 g via ORAL
  Filled 2014-09-12: qty 120

## 2014-09-12 MED ORDER — CHLORHEXIDINE GLUCONATE CLOTH 2 % EX PADS
6.0000 | MEDICATED_PAD | Freq: Every day | CUTANEOUS | Status: DC
  Administered 2014-09-12 – 2014-09-14 (×3): 6 via TOPICAL

## 2014-09-12 NOTE — Telephone Encounter (Signed)
Left message to call back  

## 2014-09-12 NOTE — Progress Notes (Signed)
MD ordered to restart heparin gtt.  Heparin restarted, will continue to monitor.

## 2014-09-12 NOTE — Progress Notes (Signed)
ANTICOAGULATION CONSULT NOTE - Follow Up Consult  Pharmacy Consult for heparin Indication: atrial fibrillation   Labs:  Recent Labs  09/11/14 0825  09/11/14 1501 09/11/14 1909 09/12/14 0125  HGB 9.6*  --   --   --  9.7*  HCT 32.0*  --   --   --  32.8*  PLT 296  --   --   --  302  APTT  --   --   --   --  57*  CREATININE 1.85*  < > 1.91* 2.01* 2.00*  TROPONINI  --   --  0.03 0.03 0.03  < > = values in this interval not displayed.    Assessment/Plan:  81yo female subtherapeutic on heparin though heparin was started late and labs were drawn just 3hr after gtt started. Will continue gtt at current rate for now and check additional level.   Wynona Neat, PharmD, BCPS  09/12/2014,3:55 AM

## 2014-09-12 NOTE — Progress Notes (Signed)
[facility]  Patient Status: Active (receiving services up to time of hospitalization)  [facility] is providing the following services: PT and OT  Referred from SNF for services but admitted to Hospital prior to start of services.  If patient discharges after hours, please call [phone].   [name] [name] 09/12/2014, 10:41 AM

## 2014-09-12 NOTE — Telephone Encounter (Signed)
Spoke with daughter and patient actually seen by  Dr. Mare Ferrari in the hospital today

## 2014-09-12 NOTE — Progress Notes (Signed)
Patient Demographics:    [name] [name], is a 81 y.o. female, DOB - Sep 09, 1930, MBW:[id number]  Admit date - 09/11/2014   Admitting Physician Barton Dubois, MD  Outpatient Primary MD for the patient is MCNEILL,WENDY, MD  LOS - 1   Chief Complaint  Patient presents with  . Head Injury        Subjective:    [name] [name] today has, No headache, No chest pain, No abdominal pain - No Nausea, No new weakness tingling or numbness, No Cough - SOB. She has some right-sided periorbital pain, cannot open her right eye.   Assessment  & Plan :     1. Mech.Fall with R Eye Subconjunctival hemorrhage and periorbital and right-sided facial hematoma. Ophthalmologist Dr. Valetta Close consulted, he has seen the patient and placed her on erythromycin ointment along with supportive care. Hematoma is actually improving, continue gentle heparin drip and monitor. Initiate PT. Patient's sister confirms that her hematoma and swelling have considerably improved since yesterday on admission.   2. Paroxysmal Afib RVR [place] Vasc score of over 3 - cardiology on board, currently on amiodarone and heparin drip, closely monitor hematoma as above, continue bisoprolol. Cardiology and templating another cardioversion. Patient apparently had cardioversion in the recent past.   3. ARF with Hyperkalemia - obtain renal ultrasound, bladder scan, if over 300 mL we'll place Foley, hold diuretic and ARB, gentle hydration, Kayexalate now, repeat BMP in the morning.   4. Panniculitis - continue nystatin and mupirocin cream.   5. Peripheral Neuropathy - on Neurontin and continue   6. Morbid Obesity - outpatient follow-up with PCP.   7. HTN - currently on beta blocker and stable.   8.COPD - no acute issues, no wheezing on exam. Continue home  albuterol-2 nebs and Symbicort. Oxygen as needed.   9. DM 2 - oral medications held currently on Lantus and sliding scale continue to monitor.  Lab Results  Component Value Date   HGBA1C 9.3* 09/11/2014    CBG (last 3)   Recent Labs  09/11/14 1702 09/11/14 2133 09/12/14 0608  GLUCAP 134* 171* 246*       Code Status : DNR  Family Communication  :   Husband has dementia.  Called daughter Eustaquio Maize on [phone] on 09/12/2014 11:20 AM. Left message.  Called son Jonni Sanger [phone] on 09/12/2014 11:22 AM. Left message.  Called sister Holland Commons [phone] 09/12/2014 11:23 AM. Discussed with her in detail. Patient has good balance and this has been her first fall. Her husband has dementia and she is the primary caregiver for him at this time.   Disposition Plan  : Remain inpatient start PT  Consults  :  Cardiology and ophthalmology  Procedures  :   CT head, face and neck. No acute changes  DVT Prophylaxis  :  Heparin    Lab Results  Component Value Date   PLT 302 09/12/2014    Inpatient Medications  Scheduled Meds: . amiodarone  150 mg Intravenous Once  . bisoprolol  7.5 mg Oral Daily  . budesonide-formoterol  2 puff Inhalation BID  . calcium citrate  200 mg of elemental calcium Oral Daily  . Chlorhexidine Gluconate Cloth  6 each Topical Q0600  . cyanocobalamin  1,000 mcg Intramuscular Weekly  .  erythromycin   Right Eye TID  . famotidine  20 mg Oral Daily  . feeding supplement (ENSURE ENLIVE)  237 mL Oral BID BM  . folic acid  1 mg Oral Daily  . gabapentin  300 mg Oral BID  . insulin aspart  0-9 Units Subcutaneous TID WC  . insulin glargine  10 Units Subcutaneous QHS  . mupirocin ointment  1 application Nasal BID  . nystatin cream   Topical BID  . pantoprazole  40 mg Oral q morning - 10a  . sodium chloride  3 mL Intravenous Q12H  . sodium polystyrene  30 g Oral Once   Continuous Infusions: . sodium chloride    . amiodarone 30 mg/hr (09/12/14 0233)  .  heparin 1,100 Units/hr (09/12/14 0916)   PRN Meds:.acetaminophen **OR** [DISCONTINUED] acetaminophen, albuterol, HYDROcodone-acetaminophen, ipratropium-albuterol, morphine injection, nitroGLYCERIN, [DISCONTINUED] ondansetron **OR** ondansetron (ZOFRAN) IV, senna-docusate  Antibiotics  :     Anti-infectives    None        Objective:   Filed Vitals:   09/11/14 2014 09/12/14 0017 09/12/14 0230 09/12/14 0338  BP: 126/104 149/116 93/57 111/48  Pulse: 96 84 105 104  Temp: 98.4 F (36.9 C) 97.6 F (36.4 C)    TempSrc: Oral Oral    Resp: 20 20  20   Height:      Weight:    107.2 kg (236 lb 5.3 oz)  SpO2: 95% 91%  90%    Wt Readings from Last 3 Encounters:  09/12/14 107.2 kg (236 lb 5.3 oz)  09/08/14 107.502 kg (237 lb)  08/29/14 98.431 kg (217 lb)     Intake/Output Summary (Last 24 hours) at 09/12/14 1114 Last data filed at 09/12/14 0900  Gross per 24 hour  Intake    340 ml  Output    200 ml  Net    140 ml     Physical Exam  Awake Alert,  No new F.N deficits, Normal affect [place]., Right eye has periorbital hematoma with edema, unable to open her right eye, right facial bruise Supple Neck,No JVD, No cervical lymphadenopathy appriciated.  Symmetrical Chest wall movement, Good air movement bilaterally, CTAB RRR,No Gallops,Rubs or new Murmurs, No Parasternal Heave +ve B.Sounds, Abd Soft, No tenderness, No organomegaly appriciated, No rebound - guarding or rigidity. No Cyanosis, Clubbing or edema, No new Rash or bruise     Data Review:   Micro Results Recent Results (from the past 240 hour(s))  MRSA PCR Screening     Status: Abnormal   Collection Time: 09/11/14  7:09 PM  Result Value Ref Range Status   MRSA by PCR POSITIVE (A) NEGATIVE Final    Comment:        The GeneXpert MRSA Assay (FDA approved for NASAL specimens only), is one component of a comprehensive MRSA colonization surveillance program. It is not intended to diagnose MRSA infection nor to guide  or monitor treatment for MRSA infections. RESULT CALLED TO, READ BACK BY AND VERIFIED WITH: D HART RN 2115 09/11/14 A BROWNING     Radiology Reports  Dg Chest 2 View  09/11/2014   CLINICAL DATA:  [place] today. Back and chest pain. Difficulty breathing.  EXAM: CHEST  2 VIEW  COMPARISON:  08/24/2014  FINDINGS: The heart is mildly enlarged but stable. The mediastinal and hilar contours are within normal limits and unchanged. No acute pulmonary findings. No pleural effusion. The bony thorax is intact.  IMPRESSION: Stable cardiac enlargement.  No acute pulmonary findings.   Electronically Signed  By: Marijo Sanes M.D.   On: 09/11/2014 13:52   Ct Head Wo Contrast  09/11/2014   CLINICAL DATA:  81 year old fell from bed to night stand. Swelling to the right orbit and cheek bone. Headache.  EXAM: CT HEAD WITHOUT CONTRAST  CT MAXILLOFACIAL WITHOUT CONTRAST  CT CERVICAL SPINE WITHOUT CONTRAST  TECHNIQUE: Multidetector CT imaging of the head, cervical spine, and maxillofacial structures were performed using the standard protocol without intravenous contrast. Multiplanar CT image reconstructions of the cervical spine and maxillofacial structures were also generated.  COMPARISON:  08/24/2014  FINDINGS: CT HEAD FINDINGS  There is some motion artifact. There is stable mild cerebral atrophy. Stable subtle low-density in the white matter. No evidence for acute hemorrhage, mass lesion, midline shift, hydrocephalus or large infarct. Large amount of swelling in the right periorbital region, right cheek and right forehead. Mild mucosal thickening in the sphenoid sinuses. No evidence for a calvarial fracture.  CT MAXILLOFACIAL FINDINGS  Soft tissue swelling involving the right cheek, right periorbital region and right forehead. Globes are intact. Mandible is intact. The mandibular condyles are located. Mild mucosal thickening in the sphenoid sinuses. No evidence for an acute facial bone fracture. Specifically, the pterygoid  plates are intact. Degenerative facet disease in the cervical spine. Incidentally, there is periapical lucency involving the right lower second molar. The lucency or caries involving the right lower canine tooth.  CT CERVICAL SPINE FINDINGS  Limited evaluation of the lung apices. Negative for fracture or dislocation in cervical spine. No soft tissue swelling in the neck. Bilateral facet disease in the cervical spine. Bone detail in the lower neck at the cervicothoracic junction is limited. Overall alignment of cervical spine is normal. There is disc space narrowing at C6-C7 with bridging anterior osteophytes.  IMPRESSION: No acute intracranial abnormality. Mild atrophy and evidence for chronic small vessel ischemic changes.  Extensive soft tissue swelling on the right side of the face. No underlying fracture.  Multilevel degenerative changes in the cervical spine without acute bone abnormality.   Electronically Signed   By: Markus Daft M.D.   On: 09/11/2014 09:24      Ct Cervical Spine Wo Contrast  09/11/2014   CLINICAL DATA:  81 year old fell from bed to night stand. Swelling to the right orbit and cheek bone. Headache.  EXAM: CT HEAD WITHOUT CONTRAST  CT MAXILLOFACIAL WITHOUT CONTRAST  CT CERVICAL SPINE WITHOUT CONTRAST  TECHNIQUE: Multidetector CT imaging of the head, cervical spine, and maxillofacial structures were performed using the standard protocol without intravenous contrast. Multiplanar CT image reconstructions of the cervical spine and maxillofacial structures were also generated.  COMPARISON:  08/24/2014  FINDINGS: CT HEAD FINDINGS  There is some motion artifact. There is stable mild cerebral atrophy. Stable subtle low-density in the white matter. No evidence for acute hemorrhage, mass lesion, midline shift, hydrocephalus or large infarct. Large amount of swelling in the right periorbital region, right cheek and right forehead. Mild mucosal thickening in the sphenoid sinuses. No evidence for a  calvarial fracture.  CT MAXILLOFACIAL FINDINGS  Soft tissue swelling involving the right cheek, right periorbital region and right forehead. Globes are intact. Mandible is intact. The mandibular condyles are located. Mild mucosal thickening in the sphenoid sinuses. No evidence for an acute facial bone fracture. Specifically, the pterygoid plates are intact. Degenerative facet disease in the cervical spine. Incidentally, there is periapical lucency involving the right lower second molar. The lucency or caries involving the right lower canine tooth.  CT CERVICAL SPINE FINDINGS  Limited evaluation of the lung apices. Negative for fracture or dislocation in cervical spine. No soft tissue swelling in the neck. Bilateral facet disease in the cervical spine. Bone detail in the lower neck at the cervicothoracic junction is limited. Overall alignment of cervical spine is normal. There is disc space narrowing at C6-C7 with bridging anterior osteophytes.  IMPRESSION: No acute intracranial abnormality. Mild atrophy and evidence for chronic small vessel ischemic changes.  Extensive soft tissue swelling on the right side of the face. No underlying fracture.  Multilevel degenerative changes in the cervical spine without acute bone abnormality.   Electronically Signed   By: Markus Daft M.D.   On: 09/11/2014 09:24       Ct Maxillofacial Wo Cm  09/11/2014   CLINICAL DATA:  81 year old fell from bed to night stand. Swelling to the right orbit and cheek bone. Headache.  EXAM: CT HEAD WITHOUT CONTRAST  CT MAXILLOFACIAL WITHOUT CONTRAST  CT CERVICAL SPINE WITHOUT CONTRAST  TECHNIQUE: Multidetector CT imaging of the head, cervical spine, and maxillofacial structures were performed using the standard protocol without intravenous contrast. Multiplanar CT image reconstructions of the cervical spine and maxillofacial structures were also generated.  COMPARISON:  08/24/2014  FINDINGS: CT HEAD FINDINGS  There is some motion artifact. There  is stable mild cerebral atrophy. Stable subtle low-density in the white matter. No evidence for acute hemorrhage, mass lesion, midline shift, hydrocephalus or large infarct. Large amount of swelling in the right periorbital region, right cheek and right forehead. Mild mucosal thickening in the sphenoid sinuses. No evidence for a calvarial fracture.  CT MAXILLOFACIAL FINDINGS  Soft tissue swelling involving the right cheek, right periorbital region and right forehead. Globes are intact. Mandible is intact. The mandibular condyles are located. Mild mucosal thickening in the sphenoid sinuses. No evidence for an acute facial bone fracture. Specifically, the pterygoid plates are intact. Degenerative facet disease in the cervical spine. Incidentally, there is periapical lucency involving the right lower second molar. The lucency or caries involving the right lower canine tooth.  CT CERVICAL SPINE FINDINGS  Limited evaluation of the lung apices. Negative for fracture or dislocation in cervical spine. No soft tissue swelling in the neck. Bilateral facet disease in the cervical spine. Bone detail in the lower neck at the cervicothoracic junction is limited. Overall alignment of cervical spine is normal. There is disc space narrowing at C6-C7 with bridging anterior osteophytes.  IMPRESSION: No acute intracranial abnormality. Mild atrophy and evidence for chronic small vessel ischemic changes.  Extensive soft tissue swelling on the right side of the face. No underlying fracture.  Multilevel degenerative changes in the cervical spine without acute bone abnormality.   Electronically Signed   By: Markus Daft M.D.   On: 09/11/2014 09:24     CBC  Recent Labs Lab 09/11/14 0825 09/12/14 0125  WBC 12.8* 11.3*  HGB 9.6* 9.7*  HCT 32.0* 32.8*  PLT 296 302  MCV 88.9 90.4  MCH 26.7 26.7  MCHC 30.0 29.6*  RDW 15.6* 16.1*  LYMPHSABS 1.5  --   MONOABS 1.0  --   EOSABS 0.2  --   BASOSABS 0.0  --     Chemistries    Recent Labs Lab 09/11/14 0825 09/11/14 1304 09/11/14 1501 09/11/14 1909 09/12/14 0125  NA 135 137 138 135 133*  K 5.8* 5.7* 6.1* 6.2* 5.7*  CL 102 104 103 100* 99*  CO2 25 26 27 26 23   GLUCOSE 113* 156* 165* 178* 251*  BUN 85*  82* 82* 79* 84*  CREATININE 1.85* 1.87* 1.91* 2.01* 2.00*  CALCIUM 8.9 8.8* 8.8* 8.7* 8.5*   ------------------------------------------------------------------------------------------------------------------ estimated creatinine clearance is 25.4 mL/min (by C-G formula based on Cr of 2). ------------------------------------------------------------------------------------------------------------------  Recent Labs  09/11/14 1501  HGBA1C 9.3*   ------------------------------------------------------------------------------------------------------------------ No results for input(s): CHOL, HDL, LDLCALC, TRIG, CHOLHDL, LDLDIRECT in the last 72 hours. ------------------------------------------------------------------------------------------------------------------  Recent Labs  09/11/14 1501  TSH 1.746   ------------------------------------------------------------------------------------------------------------------ No results for input(s): VITAMINB12, FOLATE, FERRITIN, TIBC, IRON, RETICCTPCT in the last 72 hours.  Coagulation profile No results for input(s): INR, PROTIME in the last 168 hours.  No results for input(s): DDIMER in the last 72 hours.  Cardiac Enzymes  Recent Labs Lab 09/11/14 1501 09/11/14 1909 09/12/14 0125  TROPONINI 0.03 0.03 0.03       Time Spent in minutes   35   Issa Luster K M.D on 09/12/2014 at 11:14 AM  Between 7am to 7pm - Pager - [phone]  After 7pm go to [email or web address] - password [facility]  [facility] -  Office  [phone]

## 2014-09-12 NOTE — Progress Notes (Signed)
Inpatient Diabetes Program Recommendations  AACE/ADA: New Consensus Statement on Inpatient Glycemic Control (2013)  Target Ranges:  Prepandial:   less than 140 mg/dL      Peak postprandial:   less than 180 mg/dL (1-2 hours)      Critically ill patients:  140 - 180 mg/dL   Results for [name] [name], [name] [name] (MRN [id number]) as of 09/12/2014 09:16  Ref. Range 09/11/2014 10:53 09/11/2014 17:02 09/11/2014 21:33 09/12/2014 06:08  Glucose-Capillary Latest Ref Range: 65-99 mg/dL 91 134 (H) 171 (H) 246 (H)   Results for [name] [name], [name] [name] (MRN [id number]) as of 09/12/2014 09:16  Ref. Range 09/11/2014 15:01  Hemoglobin A1C Latest Ref Range: 4.8-5.6 % 9.3 (H)   Diabetes history: DM2 Outpatient Diabetes medications: Amaryl 2 mg QAM Current orders for Inpatient glycemic control: Lantus 10 units QHS, Novolog 0-9 units TID with meals  Inpatient Diabetes Program Recommendations Insulin - Basal: Please consider increasing Lantus to 12 units QHS. Correction (SSI): Please consider adding Novolog bedtime correction scale.  Thanks, Barnie Alderman, RN, MSN, CCRN, CDE Diabetes Coordinator Inpatient Diabetes Program [phone] (Team Pager from [place] to [place]) [phone] ([facility] office) [phone] [facility] office) [phone] [facility] office)

## 2014-09-12 NOTE — Progress Notes (Signed)
UR completed 

## 2014-09-12 NOTE — Telephone Encounter (Signed)
New message      Patient is in the hospital and daughter want to give an update to the nurse

## 2014-09-12 NOTE — Progress Notes (Signed)
ANTICOAGULATION CONSULT NOTE - Follow Up Consult  Pharmacy Consult for heparin Indication: atrial fibrillation  Allergies  Allergen Reactions  . Codeine Nausea And Vomiting  . Demerol Other (See Comments)    Drunk feeling"  . Doxycycline Nausea And Vomiting  . Statins Other (See Comments)    "hurt"   . Zetia [Ezetimibe] Other (See Comments)    hurt    Patient Measurements: Height: 5\' 3"  (160 cm) Weight: 236 lb 5.3 oz (107.2 kg) IBW/kg (Calculated) : 52.4 Heparin Dosing Weight: 81 kg  Vital Signs: Temp: 97.9 F (36.6 C) (08/30 2043) Temp Source: Oral (08/30 2043) BP: 87/46 mmHg (08/30 2043) Pulse Rate: 106 (08/30 2043)  Labs:  Recent Labs  09/11/14 0825  09/11/14 1501 09/11/14 1909 09/12/14 0125 09/12/14 0756 09/12/14 1421  HGB 9.6*  --   --   --  9.7*  --   --   HCT 32.0*  --   --   --  32.8*  --   --   PLT 296  --   --   --  302  --   --   APTT  --   --   --   --  57* 120*  --   HEPARINUNFRC  --   --   --   --  >2.20*  --   --   CREATININE 1.85*  < > 1.91* 2.01* 2.00*  --  2.42*  TROPONINI  --   --  0.03 0.03 0.03  --   --   < > = values in this interval not displayed.  Estimated Creatinine Clearance: 21 mL/min (by C-G formula based on Cr of 2.42).   Medications:  Scheduled:  . amiodarone  150 mg Intravenous Once  . bisoprolol  7.5 mg Oral Daily  . budesonide-formoterol  2 puff Inhalation BID  . calcium citrate  200 mg of elemental calcium Oral Daily  . Chlorhexidine Gluconate Cloth  6 each Topical Q0600  . cyanocobalamin  1,000 mcg Intramuscular Weekly  . erythromycin   Right Eye TID  . famotidine  20 mg Oral Daily  . feeding supplement (ENSURE ENLIVE)  237 mL Oral BID BM  . folic acid  1 mg Oral Daily  . gabapentin  300 mg Oral BID  . insulin aspart  0-9 Units Subcutaneous TID WC  . insulin glargine  10 Units Subcutaneous QHS  . mupirocin ointment  1 application Nasal BID  . nystatin cream   Topical BID  . pantoprazole  40 mg Oral q morning -  10a  . sodium chloride  3 mL Intravenous Q12H   Infusions:  . sodium chloride 75 mL/hr at 09/12/14 1227  . amiodarone 30 mg/hr (09/12/14 1319)  . heparin 1,000 Units/hr (09/12/14 1228)    Assessment: 81 yo female with afib is currently on supratherapeutic heparin.  Her aPTT is 159.  Last dose of xarelto was 09/10/14.  Goal of Therapy:  Heparin level 0.3-0.7 units/ml; aPTT 66-102 seconds Monitor platelets by anticoagulation protocol: Yes   Plan:  Hold heparin x 1 hour, and reduce heparin drip to 800 units/hr. 8hr heparin level and aPTT  Haylen Bellotti, Tsz-Yin 09/12/2014,10:15 PM

## 2014-09-12 NOTE — Evaluation (Signed)
Occupational Therapy Evaluation Patient Details Name: [name] [name] MRN: [id number] DOB: [birth year] Today's Date: 09/12/2014    History of Present Illness 81 y/o female who was recently admitted to the hospital due to UTI and was found to have A. Fib with RVR requiring cardioversion. She was discharged to SNF for rehabilitation and returned home on Saturday 09/09/14. Patient was doing ok according to her; but on early hours on day of admission she roll out of bed and hurt her right side of her face and back. She presented with right scleral laceration and subconjunctival hemorrhage. Patient was also found with acute on chronic renal failure and back on A. Fib with RVR.   Clinical Impression   Patient presenting with deconditioning and decreased overall ADL & functional mobility independence. Patient mod I>supervision PTA. Patient currently requires overall min>mod assist with ADLs and functional mobility . Patient will benefit from acute OT to increase overall independence in the areas of ADLs, functional mobility, and overall safety in order to safely discharge to venue listed below.   Pt's HR ranged from 87-97 during activity.     Follow Up Recommendations  SNF;Supervision/Assistance - 24 hour    Equipment Recommendations  Other (comment);3 in 1 bedside comode (TBD defer to next venue)    Recommendations for Other Services  None at this time   Precautions / Restrictions Precautions Precautions: Fall;Other (comment) Precaution Comments: monitor HR during session Restrictions Weight Bearing Restrictions: No    Mobility Bed Mobility Overal bed mobility: Needs Assistance Bed Mobility: Sit to Supine     Supine to sit: Mod assist Sit to supine: Mod assist;HOB elevated   General bed mobility comments: Mod assist for assistance with BLEs back into bed. HOB elevated and patient heavily relying on bed rails.   Transfers Overall transfer level: Needs assistance Equipment used:  Rolling walker (2 wheeled) Transfers: Sit to/from [facility] Sit to Stand: Mod assist Stand pivot transfers: Min assist       General transfer comment: Mod assist to help lift/lower from recliner and BSC. Min assist for pivot > BSC>EOB. Pt takes extra time due to slow processing and complaints of pain in "face".     Balance Overall balance assessment: Needs assistance Sitting-balance support: No upper extremity supported;Feet supported Sitting balance-Leahy Scale: Fair     Standing balance support: Bilateral upper extremity supported;During functional activity Standing balance-Leahy Scale: Poor    ADL Overall ADL's : Needs assistance/impaired General ADL Comments: Pt requires assistance with ADLs. Pt unable to reach BLEs and unable to cross BLEs for LB ADLs. Pt did stand for peri cleaning after toileting. Pt min>mod assist with sit<>stands and functional transfers. Pt transferred recliner > BSC, then BSC>EOB>supine. Pt takes extra time and with slow processing during OT evaluation.     Vision Additional Comments: Pt with swollen shut right eye s/p fall          Pertinent Vitals/Pain Pain Assessment: Faces Faces Pain Scale: Hurts whole lot Pain Location: face Pain Descriptors / Indicators: Grimacing;Discomfort Pain Intervention(s): Limited activity within patient's tolerance;Monitored during session     Hand Dominance Right   Extremity/Trunk Assessment Upper Extremity Assessment Upper Extremity Assessment: Generalized weakness   Lower Extremity Assessment Lower Extremity Assessment: Defer to PT evaluation RLE Deficits / Details: R knee paoinful with mothion, but she was able to tolerate standing  and weight bearing, and did not mention incr pain during transition to chair RLE: Unable to fully assess due to pain   Cervical /  Trunk Assessment Cervical / Trunk Assessment: Normal   Communication Communication Communication: No difficulties (slow to answer  during OT/PT evaluations)   Cognition Arousal/Alertness: Lethargic Behavior During Therapy: WFL for tasks assessed/performed Overall Cognitive Status: Impaired/Different from baseline Area of Impairment: Safety/judgement Orientation Level: Disoriented to;Place (Intermittently thought she was at home) Current Attention Level: Sustained   Following Commands: Follows one step commands consistently;Follows one step commands with increased time Safety/Judgement: Decreased awareness of safety;Decreased awareness of deficits   Problem Solving: Slow processing                Home Living Family/patient expects to be discharged to:: Private residence Living Arrangements: Spouse/significant other Available Help at Discharge: Family (husband has dementia) Type of Home: House Home Access: Stairs to enter [employer] of Steps: 2 Entrance Stairs-Rails: Right Home Layout: Two level;Able to live on main level with bedroom/bathroom     Bathroom Shower/Tub: Occupational psychologist: Standard     Home Equipment: Cane - single point;Walker - 2 wheels;Shower seat   Additional Comments: Pt states their daughter and hired caregivers assist with her husband      Prior Functioning/Environment Level of Independence: Independent with assistive device(s)        Comments: uses cane for ambulation.    OT Diagnosis: Generalized weakness;Acute pain   OT Problem List: Decreased strength;Decreased range of motion;Decreased activity tolerance;Decreased knowledge of use of DME or AE;Decreased knowledge of precautions;Decreased cognition;Decreased safety awareness;Impaired balance (sitting and/or standing)   OT Treatment/Interventions: Self-care/ADL training;Therapeutic activities;Cognitive remediation/compensation;Patient/family education;Balance training;Therapeutic exercise;DME and/or AE instruction;Energy conservation    OT Goals(Current goals can be found in the care plan  section) Acute Rehab OT Goals Patient Stated Goal: none stated OT Goal Formulation: With patient Time For Goal Achievement: 09/26/14 Potential to Achieve Goals: Good ADL Goals Pt Will Perform Grooming: with modified independence;sitting Pt Will Perform Upper Body Bathing: Independently;sitting Pt Will Perform Lower Body Bathing: with min assist;sit to/from stand Pt Will Perform Upper Body Dressing: Independently;sitting Pt Will Perform Lower Body Dressing: with min assist;sit to/from stand Pt Will Transfer to Toilet: with supervision;bedside commode;ambulating Pt/caregiver will Perform Home Exercise Program: Increased strength;Both right and left upper extremity;With written HEP provided;With Supervision;With theraband  OT Frequency: Min 2X/week   Barriers to D/C:  None known at this time     End of Session Equipment Utilized During Treatment: Gait belt;Rolling walker  Activity Tolerance: Patient tolerated treatment well Patient left: in bed;with call bell/phone within reach;with bed alarm set   Time: 2800-3491 OT Time Calculation (min): 31 min Charges:  OT General Charges $OT Visit: 1 Procedure OT Evaluation $Initial OT Evaluation Tier I: 1 Procedure OT Treatments $Self Care/Home Management : 8-22 mins  Finnley Larusso , MS, OTR/L, CLT Pager: [phone]  09/12/2014, 11:41 AM

## 2014-09-12 NOTE — Progress Notes (Signed)
Pt with severe bruising to right side of face and eye.  MD ordered to stop heparin infusion. Heparin infusion stopped, will continue to monitor.

## 2014-09-12 NOTE — Evaluation (Signed)
Physical Therapy Evaluation Patient Details Name: [name] [name] MRN: [id number] DOB: [birth year] Today's Date: 09/12/2014   History of Present Illness  81 y/o female who was recently admitted to the hospital due to UTI and was found to have A. Fib with RVR requiring cardioversion. She was discharged to SNF for rehabilitation and returned home on Saturday 09/09/14. Patient was doing ok according to her; but on early hours on day of admission she roll out of bed and hurt her right side of her face and back. She presented with right scleral laceration and subconjunctival hemorrhage. Patient was also found with acute on chronic renal failure and back on A. Fib with RVR.  Clinical Impression   Pt admitted with above diagnosis. Pt currently with functional limitations due to the deficits listed below (see PT Problem List).  Pt will benefit from skilled PT to increase their independence and safety with mobility to allow discharge to the venue listed below.       Follow Up Recommendations SNF  Recently SNF stay for rehab; She states she would rather not go back to SNF    Equipment Recommendations  None recommended by PT    Recommendations for Other Services OT consult     Precautions / Restrictions Precautions Precautions: Fall;Other (comment)      Mobility  Bed Mobility Overal bed mobility: Needs Assistance Bed Mobility: Supine to Sit     Supine to sit: Mod assist     General bed mobility comments: mod assist to support trunk to get to sitting EOB. Pt using bed rail and HOB elelvated.  Tendancy towards posterior LOB until she got her feet on the floor.   Transfers Overall transfer level: Needs assistance Equipment used: 1 person hand held assist Transfers: Sit to/from [facility] Sit to Stand: Mod assist Stand pivot transfers: Min assist       General transfer comment: Heavy mod assist to raise up onto feet; support given at pt's Right shoulder girdle at pt's  request; reports she normally can get up with a cane; noted she is dependent on momentum to get up; min assist for support as pt took pivot steps to chair; cues for technqiue and hand palcement on armrests of chair  Ambulation/Gait                Stairs            Wheelchair Mobility    Modified Rankin (Stroke Patients Only)       Balance     Sitting balance-Leahy Scale: Fair       Standing balance-Leahy Scale: Poor                               Pertinent Vitals/Pain Pain Assessment: Faces Faces Pain Scale: Hurts whole lot Pain Location: R knee, face, low back pain Pain Descriptors / Indicators: Aching;Grimacing Pain Intervention(s): Limited activity within patient's tolerance;Monitored during session;Repositioned;Patient requesting pain meds-RN notified    Home Living Family/patient expects to be discharged to:: Private residence Living Arrangements: Spouse/significant other Available Help at Discharge: Family (husband has dementia) Type of Home: House Home Access: Stairs to enter Entrance Stairs-Rails: Right Entrance Stairs-Number of Steps: 2 Home Layout: Two level;Able to live on main level with bedroom/bathroom Home Equipment: Kasandra Knudsen - single point;Walker - 2 wheels;Shower seat Additional Comments: Pt states their daughter and hired caregivers assist with her husband    Prior Function Level of Independence: Independent  with assistive device(s)         Comments: uses cane for ambulation.     Hand Dominance   Dominant Hand: Right    Extremity/Trunk Assessment   Upper Extremity Assessment: Defer to OT evaluation           Lower Extremity Assessment: Generalized weakness;RLE deficits/detail RLE Deficits / Details: R knee paoinful with mothion, but she was able to tolerate standing  and weight bearing, and did not mention incr pain during transition to chair       Communication   Communication: No difficulties (Slow to answer  on morning of eval)  Cognition Arousal/Alertness: Awake/alert Behavior During Therapy: WFL for tasks assessed/performed Overall Cognitive Status: Impaired/Different from baseline Area of Impairment: Orientation;Safety/judgement Orientation Level: Disoriented to;Place (Intermittently thought she was at home) Current Attention Level: Sustained   Following Commands: Follows one step commands consistently Safety/Judgement: Decreased awareness of safety;Decreased awareness of deficits   Problem Solving: Slow processing      General Comments      Exercises        Assessment/Plan    PT Assessment Patient needs continued PT services  PT Diagnosis Difficulty walking;Abnormality of gait;Generalized weakness   PT Problem List Decreased strength;Decreased activity tolerance;Decreased balance;Decreased mobility;Decreased cognition;Decreased knowledge of use of DME;Decreased knowledge of precautions;Decreased safety awareness  PT Treatment Interventions DME instruction;Gait training;Stair training;Functional mobility training;Therapeutic activities;Therapeutic exercise;Balance training;Neuromuscular re-education;Cognitive remediation;Patient/family education   PT Goals (Current goals can be found in the Care Plan section) Acute Rehab PT Goals PT Goal Formulation: Patient unable to participate in goal setting Time For Goal Achievement: 09/26/14 Potential to Achieve Goals: Good    Frequency Min 3X/week   Barriers to discharge Decreased caregiver support      Co-evaluation               End of Session Equipment Utilized During Treatment: Gait belt Activity Tolerance: Patient limited by pain;Patient limited by fatigue Patient left: in chair;with call bell/phone within reach;with chair alarm set Nurse Communication: Mobility status         Time: 0912-0938 PT Time Calculation (min) (ACUTE ONLY): 26 min   Charges:   PT Evaluation $Initial PT Evaluation Tier I: 1  Procedure PT Treatments $Therapeutic Activity: 8-22 mins   PT G Codes:        Roney Marion Hamff 09/12/2014, 10:07 AM  Roney Marion, PT  Acute Rehabilitation Services Pager [phone] Office [phone]

## 2014-09-12 NOTE — Progress Notes (Signed)
ANTICOAGULATION CONSULT NOTE - Initial Consult  Pharmacy Consult for Heparin Indication: atrial fibrillation  Allergies  Allergen Reactions  . Codeine Nausea And Vomiting  . Demerol Other (See Comments)    Drunk feeling"  . Doxycycline Nausea And Vomiting  . Statins Other (See Comments)    "hurt"   . Zetia [Ezetimibe] Other (See Comments)    hurt    Patient Measurements: Height: 5\' 3"  (160 cm) Weight: 236 lb 5.3 oz (107.2 kg) IBW/kg (Calculated) : 52.4 Heparin Dosing Weight: 81 kg  Vital Signs: Temp: 97.6 F (36.4 C) (08/30 0017) Temp Source: Oral (08/30 0017) BP: 111/48 mmHg (08/30 0338) Pulse Rate: 104 (08/30 0338)  Labs:  Recent Labs  09/11/14 0825  09/11/14 1501 09/11/14 1909 09/12/14 0125 09/12/14 0756  HGB 9.6*  --   --   --  9.7*  --   HCT 32.0*  --   --   --  32.8*  --   PLT 296  --   --   --  302  --   APTT  --   --   --   --  57* 120*  HEPARINUNFRC  --   --   --   --  >2.20*  --   CREATININE 1.85*  < > 1.91* 2.01* 2.00*  --   TROPONINI  --   --  0.03 0.03 0.03  --   < > = values in this interval not displayed.  Estimated Creatinine Clearance: 25.4 mL/min (by C-G formula based on Cr of 2).   Medical History: Past Medical History  Diagnosis Date  . Mild aortic stenosis     a. 10/2010 Echo: Ef 60-65%, no rwma, mild LVH no AS;  b. 08/2014 Echo: mild to mod AS.  Marland Kitchen Chronic back pain   . Peripheral neuropathy   . GERD (gastroesophageal reflux disease)   . Essential hypertension   . History of pneumonia   . History of bronchitis   . Basal cell carcinoma     a. 2013.  . Non-cardiac chest pain     a. 12/2013 LHC w/ nl coronaries and nl EF 60%.  . Frequent PAC's   . Heart murmur   . Type II diabetes mellitus   . Atrial fibrillation with RVR     a. 08/2014 s/p TEE/DCCV;  b. CHA2DS2VASc = 5-->Xarelto.  . Chronic diastolic CHF (congestive heart failure)     a. 08/2014 Echo: EF 55-60%, mild to mod AS, mild MR, mod dil LA/RA, mildly dil RV, PASP 29mmHg.   . Morbid obesity    Assessment: 81yo female continues on IV heparin for atrial fibrillation.  Pt was on Xarelto 20 mg daily with supper PTA for Afib, last dose with supper on 09/10/14.  Had just started Xarelto during last admission (8/5-11/2014); DCCV on 08/23/14.  Pt experienced fall PTA, hitting right side of her face and back. Right scleral laceration and subconjunctival hemorrhage noted.  Has been seen by Ophthamology >> no acute intervention.  HL still elevated d/t recent Xarelto use.  aPTT slightly supratherapeutic at 120 seconds this morning while running at 1100 units/hr.  Note, heparin infusion was stopped temporarily (for about 30 minutes) this morning after aPTT was already drawn.  Goal of Therapy:  Heparin level 0.3-0.7 units/ml aPTT 66-102 seconds Monitor platelets by anticoagulation protocol: Yes   Plan:  - Decrease heparin drip 1000 units/hr - Check 8hr aPTT - Monitor daily aPTT and heparin levels until concordant, CBC, s/sx of bleeding - Will  follow for any changes with injuries from fall - F/u for change to oral anticoagulation when able   Drucie Opitz, PharmD Clinical Pharmacist Pager: [phone] 09/12/2014 9:45 AM

## 2014-09-12 NOTE — Progress Notes (Signed)
Patient Name: [name] [name] Date of Encounter: 09/12/2014   Principal Problem:   Atrial fibrillation with RVR Active Problems:   AKI (acute kidney injury)   Subconjunctival hemorrhage of right eye   Fall   Acute renal failure superimposed on stage 4 chronic kidney disease   Acute on chronic diastolic CHF (congestive heart failure), NYHA class 2   Benign hypertensive heart disease without heart failure   Diabetic peripheral neuropathy associated with type 2 diabetes mellitus   Type II diabetes mellitus   Pure hypercholesterolemia   Mild to Moderate Ao Stenosis   PAC (premature atrial contraction)   COPD GOLD II    Chronic low back pain   Severe obesity (BMI >= 40)    SUBJECTIVE  C/o right eye pain this am.  No c/p or sob.  CURRENT MEDS . amiodarone  150 mg Intravenous Once  . bisoprolol  7.5 mg Oral Daily  . budesonide-formoterol  2 puff Inhalation BID  . calcium citrate  200 mg of elemental calcium Oral Daily  . Chlorhexidine Gluconate Cloth  6 each Topical Q0600  . cyanocobalamin  1,000 mcg Intramuscular Weekly  . erythromycin   Right Eye TID  . famotidine  20 mg Oral Daily  . feeding supplement (ENSURE ENLIVE)  237 mL Oral BID BM  . folic acid  1 mg Oral Daily  . gabapentin  300 mg Oral BID  . insulin aspart  0-9 Units Subcutaneous TID WC  . insulin glargine  10 Units Subcutaneous QHS  . mupirocin ointment  1 application Nasal BID  . nystatin cream   Topical BID  . pantoprazole  40 mg Oral q morning - 10a  . sodium chloride  3 mL Intravenous Q12H    OBJECTIVE  Filed Vitals:   09/11/14 2014 09/12/14 0017 09/12/14 0230 09/12/14 0338  BP: 126/104 149/116 93/57 111/48  Pulse: 96 84 105 104  Temp: 98.4 F (36.9 C) 97.6 F (36.4 C)    TempSrc: Oral Oral    Resp: 20 20  20   Height:      Weight:    236 lb 5.3 oz (107.2 kg)  SpO2: 95% 91%  90%    Intake/Output Summary (Last 24 hours) at 09/12/14 0843 Last data filed at 09/11/14 1706  Gross per 24 hour    Intake    720 ml  Output    200 ml  Net    520 ml   Filed Weights   09/11/14 0731 09/11/14 1359 09/12/14 0338  Weight: 220 lb (99.791 kg) 234 lb 2.1 oz (106.2 kg) 236 lb 5.3 oz (107.2 kg)    PHYSICAL EXAM  General: Pleasant, c/o r eye pain. Neuro: Alert and oriented X 3. Moves all extremities spontaneously. Psych: flat affect. HEENT:  Significant right eye/facial swelling and bruising.  No significant d/c or bleeding.  Overall, eye/face look better than yesterday.  Neck: Supple, obese without bruits.  Difficult to gauge JVP 2/2 girth. Lungs:  Resp regular and unlabored, diminished breath sounds bilat. Heart: IR, IR, distant. Abdomen: Soft, non-tender, non-distended, BS + x 4.  Extremities: No clubbing, cyanosis or edema. DP/PT/Radials 2+ and equal bilaterally.  Accessory Clinical Findings  CBC  Recent Labs  09/11/14 0825 09/12/14 0125  WBC 12.8* 11.3*  NEUTROABS 10.2*  --   HGB 9.6* 9.7*  HCT 32.0* 32.8*  MCV 88.9 90.4  PLT 296 672   Basic Metabolic Panel  Recent Labs  09/11/14 1909 09/12/14 0125  NA 135 133*  K 6.2* 5.7*  CL 100* 99*  CO2 26 23  GLUCOSE 178* 251*  BUN 79* 84*  CREATININE 2.01* 2.00*  CALCIUM 8.7* 8.5*   Cardiac Enzymes  Recent Labs  09/11/14 1501 09/11/14 1909 09/12/14 0125  TROPONINI 0.03 0.03 0.03   Hemoglobin A1C  Recent Labs  09/11/14 1501  HGBA1C 9.3*   Thyroid Function Tests  Recent Labs  09/11/14 1501  TSH 1.746    TELE  Afib, 90's.  Radiology/Studies  Dg Chest 2 View  09/11/2014   CLINICAL DATA:  [place] today. Back and chest pain. Difficulty breathing.  EXAM: CHEST  2 VIEW  COMPARISON:  08/24/2014  FINDINGS: The heart is mildly enlarged but stable. The mediastinal and hilar contours are within normal limits and unchanged. No acute pulmonary findings. No pleural effusion. The bony thorax is intact.  IMPRESSION: Stable cardiac enlargement.  No acute pulmonary findings.   Electronically Signed   By: Marijo Sanes  M.D.   On: 09/11/2014 13:52   Ct Head Wo Contrast  09/11/2014   CLINICAL DATA:  81 year old fell from bed to night stand. Swelling to the right orbit and cheek bone. Headache.  EXAM: CT HEAD WITHOUT CONTRAST  CT MAXILLOFACIAL WITHOUT CONTRAST  CT CERVICAL SPINE WITHOUT CONTRAST  TECHNIQUE: Multidetector CT imaging of the head, cervical spine, and maxillofacial structures were performed using the standard protocol without intravenous contrast. Multiplanar CT image reconstructions of the cervical spine and maxillofacial structures were also generated.  COMPARISON:  08/24/2014  FINDINGS: CT HEAD FINDINGS  There is some motion artifact. There is stable mild cerebral atrophy. Stable subtle low-density in the white matter. No evidence for acute hemorrhage, mass lesion, midline shift, hydrocephalus or large infarct. Large amount of swelling in the right periorbital region, right cheek and right forehead. Mild mucosal thickening in the sphenoid sinuses. No evidence for a calvarial fracture.  CT MAXILLOFACIAL FINDINGS  Soft tissue swelling involving the right cheek, right periorbital region and right forehead. Globes are intact. Mandible is intact. The mandibular condyles are located. Mild mucosal thickening in the sphenoid sinuses. No evidence for an acute facial bone fracture. Specifically, the pterygoid plates are intact. Degenerative facet disease in the cervical spine. Incidentally, there is periapical lucency involving the right lower second molar. The lucency or caries involving the right lower canine tooth.  CT CERVICAL SPINE FINDINGS  Limited evaluation of the lung apices. Negative for fracture or dislocation in cervical spine. No soft tissue swelling in the neck. Bilateral facet disease in the cervical spine. Bone detail in the lower neck at the cervicothoracic junction is limited. Overall alignment of cervical spine is normal. There is disc space narrowing at C6-C7 with bridging anterior osteophytes.   IMPRESSION: No acute intracranial abnormality. Mild atrophy and evidence for chronic small vessel ischemic changes.  Extensive soft tissue swelling on the right side of the face. No underlying fracture.  Multilevel degenerative changes in the cervical spine without acute bone abnormality.   Electronically Signed   By: Markus Daft M.D.   On: 09/11/2014 09:24   Ct Cervical Spine Wo Contrast  09/11/2014   CLINICAL DATA:  81 year old fell from bed to night stand. Swelling to the right orbit and cheek bone. Headache.  EXAM: CT HEAD WITHOUT CONTRAST  CT MAXILLOFACIAL WITHOUT CONTRAST  CT CERVICAL SPINE WITHOUT CONTRAST  TECHNIQUE: Multidetector CT imaging of the head, cervical spine, and maxillofacial structures were performed using the standard protocol without intravenous contrast. Multiplanar CT image reconstructions of the cervical spine  and maxillofacial structures were also generated.  COMPARISON:  08/24/2014  FINDINGS: CT HEAD FINDINGS  There is some motion artifact. There is stable mild cerebral atrophy. Stable subtle low-density in the white matter. No evidence for acute hemorrhage, mass lesion, midline shift, hydrocephalus or large infarct. Large amount of swelling in the right periorbital region, right cheek and right forehead. Mild mucosal thickening in the sphenoid sinuses. No evidence for a calvarial fracture.  CT MAXILLOFACIAL FINDINGS  Soft tissue swelling involving the right cheek, right periorbital region and right forehead. Globes are intact. Mandible is intact. The mandibular condyles are located. Mild mucosal thickening in the sphenoid sinuses. No evidence for an acute facial bone fracture. Specifically, the pterygoid plates are intact. Degenerative facet disease in the cervical spine. Incidentally, there is periapical lucency involving the right lower second molar. The lucency or caries involving the right lower canine tooth.  CT CERVICAL SPINE FINDINGS  Limited evaluation of the lung apices.  Negative for fracture or dislocation in cervical spine. No soft tissue swelling in the neck. Bilateral facet disease in the cervical spine. Bone detail in the lower neck at the cervicothoracic junction is limited. Overall alignment of cervical spine is normal. There is disc space narrowing at C6-C7 with bridging anterior osteophytes.  IMPRESSION: No acute intracranial abnormality. Mild atrophy and evidence for chronic small vessel ischemic changes.  Extensive soft tissue swelling on the right side of the face. No underlying fracture.  Multilevel degenerative changes in the cervical spine without acute bone abnormality.   Electronically Signed   By: Markus Daft M.D.   On: 09/11/2014 09:24   Ct Maxillofacial Wo Cm  09/11/2014   CLINICAL DATA:  81 year old fell from bed to night stand. Swelling to the right orbit and cheek bone. Headache.  EXAM: CT HEAD WITHOUT CONTRAST  CT MAXILLOFACIAL WITHOUT CONTRAST  CT CERVICAL SPINE WITHOUT CONTRAST  TECHNIQUE: Multidetector CT imaging of the head, cervical spine, and maxillofacial structures were performed using the standard protocol without intravenous contrast. Multiplanar CT image reconstructions of the cervical spine and maxillofacial structures were also generated.  COMPARISON:  08/24/2014  FINDINGS: CT HEAD FINDINGS  There is some motion artifact. There is stable mild cerebral atrophy. Stable subtle low-density in the white matter. No evidence for acute hemorrhage, mass lesion, midline shift, hydrocephalus or large infarct. Large amount of swelling in the right periorbital region, right cheek and right forehead. Mild mucosal thickening in the sphenoid sinuses. No evidence for a calvarial fracture.  CT MAXILLOFACIAL FINDINGS  Soft tissue swelling involving the right cheek, right periorbital region and right forehead. Globes are intact. Mandible is intact. The mandibular condyles are located. Mild mucosal thickening in the sphenoid sinuses. No evidence for an acute facial  bone fracture. Specifically, the pterygoid plates are intact. Degenerative facet disease in the cervical spine. Incidentally, there is periapical lucency involving the right lower second molar. The lucency or caries involving the right lower canine tooth.  CT CERVICAL SPINE FINDINGS  Limited evaluation of the lung apices. Negative for fracture or dislocation in cervical spine. No soft tissue swelling in the neck. Bilateral facet disease in the cervical spine. Bone detail in the lower neck at the cervicothoracic junction is limited. Overall alignment of cervical spine is normal. There is disc space narrowing at C6-C7 with bridging anterior osteophytes.  IMPRESSION: No acute intracranial abnormality. Mild atrophy and evidence for chronic small vessel ischemic changes.  Extensive soft tissue swelling on the right side of the face. No underlying fracture.  Multilevel degenerative changes in the cervical spine without acute bone abnormality.   Electronically Signed   By: Markus Daft M.D.   On: 09/11/2014 09:24    ASSESSMENT AND PLAN  1. Afib RVR: Pt presented 8/29 following fall from bed resulting to significant bruising and swelling to the right side of her face and eye. She was found to be back in afib with mild volume overload.  Amio started yesterday and currently she remains in rate-controlled afib in the 90's.  She denies palpitations.  Breathing better this AM (lying flat in bed).  It was previously felt to be important to maintain sinus rhythm given diast chf. She was on xarelto @ home and this was switched to heparin on admission in case she developed significant bleeding in/around her eye.  Her eye and face appear to be stable - less swollen today.  Cont heparin.  If she does not convert on amio, could consider dccv w/in next 24-48 hrs with switch back to xarelto if eye stable.  2. Acute on chronic diastolic CHF: Pt had been noticing mild DOE, lower ext edema, and bendopnea since last Thursday. Wt  was up a few lbs on admission.  She was treated with IV lasix last night.  Bed scale wt has her up 2 lbs.  I/O  + 0.5 L (? Only 200 ml out last night).  BUN/Creat up since last hospitalization and up from 1.85 yesterday morning to 2.0 this morning.  Hold off on further diuresis given stability of breathing.  3. R subconjunctival hemorrhage: Seen by ophthalmology. No acute need for intervention.  Follow on anticoagulation.  4. CKD IV: BUN/Creat up. Follow.  5.  Hyperkalemia:  In setting of CKD IV.  Given kayexalate last night.  K 5.7.  Will give additional kayexalate this AM.  Signed, Murray Hodgkins NP Agree with above assessment. Patient herself is not aware of her heart rhythm and does not know when she went back into atrial fibrillation. Telemetry today shows atrial fibrillation with controlled ventricular response. Hopefully she may convert on IV amiodarone. If not we will consider DCCV later in week.

## 2014-09-13 ENCOUNTER — Inpatient Hospital Stay (HOSPITAL_COMMUNITY): Payer: Medicare HMO

## 2014-09-13 ENCOUNTER — Telehealth: Payer: Self-pay | Admitting: *Deleted

## 2014-09-13 DIAGNOSIS — N183 Chronic kidney disease, stage 3 unspecified: Secondary | ICD-10-CM

## 2014-09-13 DIAGNOSIS — J449 Chronic obstructive pulmonary disease, unspecified: Secondary | ICD-10-CM

## 2014-09-13 DIAGNOSIS — N179 Acute kidney failure, unspecified: Secondary | ICD-10-CM

## 2014-09-13 DIAGNOSIS — G629 Polyneuropathy, unspecified: Secondary | ICD-10-CM

## 2014-09-13 DIAGNOSIS — E1142 Type 2 diabetes mellitus with diabetic polyneuropathy: Secondary | ICD-10-CM

## 2014-09-13 LAB — URINALYSIS, ROUTINE W REFLEX MICROSCOPIC
Glucose, UA: NEGATIVE mg/dL
Hgb urine dipstick: NEGATIVE
KETONES UR: NEGATIVE mg/dL
NITRITE: NEGATIVE
PH: 5 (ref 5.0–8.0)
PROTEIN: 30 mg/dL — AB
Specific Gravity, Urine: 1.026 (ref 1.005–1.030)
UROBILINOGEN UA: 0.2 mg/dL (ref 0.0–1.0)

## 2014-09-13 LAB — BASIC METABOLIC PANEL
ANION GAP: 12 (ref 5–15)
BUN: 88 mg/dL — ABNORMAL HIGH (ref 6–20)
CHLORIDE: 102 mmol/L (ref 101–111)
CO2: 23 mmol/L (ref 22–32)
Calcium: 8.2 mg/dL — ABNORMAL LOW (ref 8.9–10.3)
Creatinine, Ser: 3 mg/dL — ABNORMAL HIGH (ref 0.44–1.00)
GFR, EST AFRICAN AMERICAN: 16 mL/min — AB (ref >60)
GFR, EST NON AFRICAN AMERICAN: 14 mL/min — AB (ref >60)
Glucose, Bld: 173 mg/dL — ABNORMAL HIGH (ref 65–99)
POTASSIUM: 4.7 mmol/L (ref 3.5–5.1)
SODIUM: 137 mmol/L (ref 135–145)

## 2014-09-13 LAB — CBC
HCT: 32.8 % — ABNORMAL LOW (ref 36.0–46.0)
HEMOGLOBIN: 9.7 g/dL — AB (ref 12.0–15.0)
MCH: 27.5 pg (ref 26.0–34.0)
MCHC: 29.6 g/dL — ABNORMAL LOW (ref 30.0–36.0)
MCV: 92.9 fL (ref 78.0–100.0)
Platelets: 176 10*3/uL (ref 150–400)
RBC: 3.53 MIL/uL — AB (ref 3.87–5.11)
RDW: 16.4 % — ABNORMAL HIGH (ref 11.5–15.5)
WBC: 10.4 10*3/uL (ref 4.0–10.5)

## 2014-09-13 LAB — URINE MICROSCOPIC-ADD ON

## 2014-09-13 LAB — SODIUM, URINE, RANDOM: Sodium, Ur: <10 mmol/L

## 2014-09-13 LAB — GLUCOSE, CAPILLARY
GLUCOSE-CAPILLARY: 149 mg/dL — AB (ref 65–99)
GLUCOSE-CAPILLARY: 153 mg/dL — AB (ref 65–99)
GLUCOSE-CAPILLARY: 149 mg/dL — AB (ref 65–99)
Glucose-Capillary: 132 mg/dL — ABNORMAL HIGH (ref 65–99)

## 2014-09-13 LAB — APTT: APTT: 69 s — AB (ref 24–37)

## 2014-09-13 LAB — OSMOLALITY, URINE: Osmolality, Ur: 354 mOsm/kg — ABNORMAL LOW (ref 390–1090)

## 2014-09-13 LAB — HEPARIN LEVEL (UNFRACTIONATED)
HEPARIN UNFRACTIONATED: 0.45 [IU]/mL (ref 0.30–0.70)
HEPARIN UNFRACTIONATED: 0.41 [IU]/mL (ref 0.30–0.70)

## 2014-09-13 LAB — LACTIC ACID, PLASMA: Lactic Acid, Venous: 2.6 mmol/L (ref 0.5–2.0)

## 2014-09-13 LAB — CREATININE, URINE, RANDOM: CREATININE, URINE: 137.4 mg/dL

## 2014-09-13 LAB — PROCALCITONIN: PROCALCITONIN: 0.16 ng/mL

## 2014-09-13 MED ORDER — SODIUM CHLORIDE 0.9 % IV BOLUS (SEPSIS)
500.0000 mL | Freq: Once | INTRAVENOUS | Status: AC
  Administered 2014-09-13: 23:00:00 via INTRAVENOUS

## 2014-09-13 MED ORDER — FUROSEMIDE 10 MG/ML IJ SOLN
80.0000 mg | Freq: Two times a day (BID) | INTRAMUSCULAR | Status: DC
  Filled 2014-09-13: qty 8

## 2014-09-13 NOTE — Progress Notes (Signed)
Patient Name: [name] [name] Date of Encounter: 09/13/2014     Principal Problem:   Atrial fibrillation with RVR Active Problems:   AKI (acute kidney injury)   Subconjunctival hemorrhage of right eye   Fall   Acute renal failure superimposed on stage 4 chronic kidney disease   Acute on chronic diastolic CHF (congestive heart failure), NYHA class 2   Benign hypertensive heart disease without heart failure   Diabetic peripheral neuropathy associated with type 2 diabetes mellitus   Type II diabetes mellitus   Pure hypercholesterolemia   Mild to Moderate Ao Stenosis   PAC (premature atrial contraction)   COPD GOLD II    Chronic low back pain   Severe obesity (BMI >= 40)    SUBJECTIVE  Lethargic this morning.  Opens eyes to arousal.  Quickly falls back to sleep.  Labs pending.  Remains in rate-controlled afib.  CURRENT MEDS . amiodarone  150 mg Intravenous Once  . bisoprolol  7.5 mg Oral Daily  . budesonide-formoterol  2 puff Inhalation BID  . calcium citrate  200 mg of elemental calcium Oral Daily  . Chlorhexidine Gluconate Cloth  6 each Topical Q0600  . cyanocobalamin  1,000 mcg Intramuscular Weekly  . erythromycin   Right Eye TID  . famotidine  20 mg Oral Daily  . feeding supplement (ENSURE ENLIVE)  237 mL Oral BID BM  . folic acid  1 mg Oral Daily  . gabapentin  300 mg Oral BID  . insulin aspart  0-9 Units Subcutaneous TID WC  . insulin glargine  10 Units Subcutaneous QHS  . mupirocin ointment  1 application Nasal BID  . nystatin cream   Topical BID  . pantoprazole  40 mg Oral q morning - 10a  . sodium chloride  3 mL Intravenous Q12H    OBJECTIVE  Filed Vitals:   09/12/14 1320 09/12/14 2043 09/13/14 0119 09/13/14 0440  BP: 95/47 87/46 92/50  85/51  Pulse: 94 106  81  Temp: 98.9 F (37.2 C) 97.9 F (36.6 C)  97.8 F (36.6 C)  TempSrc: Oral Oral  Axillary  Resp: 18 18  18   Height:      Weight:    242 lb 8.1 oz (110 kg)  SpO2: 98% 93% 100% 100%     Intake/Output Summary (Last 24 hours) at 09/13/14 0756 Last data filed at 09/13/14 0647  Gross per 24 hour  Intake 3249.52 ml  Output     50 ml  Net 3199.52 ml   Filed Weights   09/11/14 1359 09/12/14 0338 09/13/14 0440  Weight: 234 lb 2.1 oz (106.2 kg) 236 lb 5.3 oz (107.2 kg) 242 lb 8.1 oz (110 kg)    PHYSICAL EXAM  General: lethargic. Neuro: opens eyes to verbal/tactile stimuli.  Quickly falls back to sleep. Psych: lethargic. HEENT: Significant right eye/facial swelling and bruising. No significant d/c or bleeding. Overall, eye/face look better than yesterday. Neck: Supple, obese without bruits. Difficult to gauge JVP 2/2 girth. Lungs: Resp regular and unlabored, diminished breath sounds bilat - poor effort. Heart: IR, IR, distant. Abdomen: Soft, non-tender, non-distended, BS + x 4.  Extremities: No clubbing, cyanosis.  Trace to 1+ bilat ankle edema, R>L. DP/PT/Radials 2+ and equal bilaterally.  Accessory Clinical Findings  CBC  Recent Labs  09/11/14 0825 09/12/14 0125  WBC 12.8* 11.3*  NEUTROABS 10.2*  --   HGB 9.6* 9.7*  HCT 32.0* 32.8*  MCV 88.9 90.4  PLT 296 889   Basic Metabolic Panel  Recent Labs  09/12/14 0125 09/12/14 1421  NA 133* 135  K 5.7* 5.6*  CL 99* 102  CO2 23 21*  GLUCOSE 251* 237*  BUN 84* 89*  CREATININE 2.00* 2.42*  CALCIUM 8.5* 8.5*   Cardiac Enzymes  Recent Labs  09/11/14 1501 09/11/14 1909 09/12/14 0125  TROPONINI 0.03 0.03 0.03   Hemoglobin A1C  Recent Labs  09/11/14 1501  HGBA1C 9.3*   Thyroid Function Tests  Recent Labs  09/11/14 1501  TSH 1.746    TELE  Afib, 80's.  Radiology/Studies  Dg Chest 2 View  09/11/2014   CLINICAL DATA:  [place] today. Back and chest pain. Difficulty breathing.  EXAM: CHEST  2 VIEW  COMPARISON:  08/24/2014  FINDINGS: The heart is mildly enlarged but stable. The mediastinal and hilar contours are within normal limits and unchanged. No acute pulmonary findings. No  pleural effusion. The bony thorax is intact.  IMPRESSION: Stable cardiac enlargement.  No acute pulmonary findings.   Electronically Signed   By: Marijo Sanes M.D.   On: 09/11/2014 13:52   Ct Head Wo Contrast  09/11/2014   CLINICAL DATA:  81 year old fell from bed to night stand. Swelling to the right orbit and cheek bone. Headache.  EXAM: CT HEAD WITHOUT CONTRAST  CT MAXILLOFACIAL WITHOUT CONTRAST  CT CERVICAL SPINE WITHOUT CONTRAST  TECHNIQUE: Multidetector CT imaging of the head, cervical spine, and maxillofacial structures were performed using the standard protocol without intravenous contrast. Multiplanar CT image reconstructions of the cervical spine and maxillofacial structures were also generated.  COMPARISON:  08/24/2014  FINDINGS: CT HEAD FINDINGS  There is some motion artifact. There is stable mild cerebral atrophy. Stable subtle low-density in the white matter. No evidence for acute hemorrhage, mass lesion, midline shift, hydrocephalus or large infarct. Large amount of swelling in the right periorbital region, right cheek and right forehead. Mild mucosal thickening in the sphenoid sinuses. No evidence for a calvarial fracture.  CT MAXILLOFACIAL FINDINGS  Soft tissue swelling involving the right cheek, right periorbital region and right forehead. Globes are intact. Mandible is intact. The mandibular condyles are located. Mild mucosal thickening in the sphenoid sinuses. No evidence for an acute facial bone fracture. Specifically, the pterygoid plates are intact. Degenerative facet disease in the cervical spine. Incidentally, there is periapical lucency involving the right lower second molar. The lucency or caries involving the right lower canine tooth.  CT CERVICAL SPINE FINDINGS  Limited evaluation of the lung apices. Negative for fracture or dislocation in cervical spine. No soft tissue swelling in the neck. Bilateral facet disease in the cervical spine. Bone detail in the lower neck at the  cervicothoracic junction is limited. Overall alignment of cervical spine is normal. There is disc space narrowing at C6-C7 with bridging anterior osteophytes.  IMPRESSION: No acute intracranial abnormality. Mild atrophy and evidence for chronic small vessel ischemic changes.  Extensive soft tissue swelling on the right side of the face. No underlying fracture.  Multilevel degenerative changes in the cervical spine without acute bone abnormality.   Electronically Signed   By: Markus Daft M.D.   On: 09/11/2014 09:24   Ct Cervical Spine Wo Contrast  09/11/2014   CLINICAL DATA:  81 year old fell from bed to night stand. Swelling to the right orbit and cheek bone. Headache.  EXAM: CT HEAD WITHOUT CONTRAST  CT MAXILLOFACIAL WITHOUT CONTRAST  CT CERVICAL SPINE WITHOUT CONTRAST  TECHNIQUE: Multidetector CT imaging of the head, cervical spine, and maxillofacial structures were performed using the standard  protocol without intravenous contrast. Multiplanar CT image reconstructions of the cervical spine and maxillofacial structures were also generated.  COMPARISON:  08/24/2014  FINDINGS: CT HEAD FINDINGS  There is some motion artifact. There is stable mild cerebral atrophy. Stable subtle low-density in the white matter. No evidence for acute hemorrhage, mass lesion, midline shift, hydrocephalus or large infarct. Large amount of swelling in the right periorbital region, right cheek and right forehead. Mild mucosal thickening in the sphenoid sinuses. No evidence for a calvarial fracture.  CT MAXILLOFACIAL FINDINGS  Soft tissue swelling involving the right cheek, right periorbital region and right forehead. Globes are intact. Mandible is intact. The mandibular condyles are located. Mild mucosal thickening in the sphenoid sinuses. No evidence for an acute facial bone fracture. Specifically, the pterygoid plates are intact. Degenerative facet disease in the cervical spine. Incidentally, there is periapical lucency involving the  right lower second molar. The lucency or caries involving the right lower canine tooth.  CT CERVICAL SPINE FINDINGS  Limited evaluation of the lung apices. Negative for fracture or dislocation in cervical spine. No soft tissue swelling in the neck. Bilateral facet disease in the cervical spine. Bone detail in the lower neck at the cervicothoracic junction is limited. Overall alignment of cervical spine is normal. There is disc space narrowing at C6-C7 with bridging anterior osteophytes.  IMPRESSION: No acute intracranial abnormality. Mild atrophy and evidence for chronic small vessel ischemic changes.  Extensive soft tissue swelling on the right side of the face. No underlying fracture.  Multilevel degenerative changes in the cervical spine without acute bone abnormality.   Electronically Signed   By: Markus Daft M.D.   On: 09/11/2014 09:24   US Renal  09/12/2014   CLINICAL DATA:  Acute renal failure. History of hypertension and diabetes. Subsequent encounter.  EXAM: RENAL / URINARY TRACT ULTRASOUND COMPLETE  COMPARISON:  Renal ultrasound 08/21/2014.  FINDINGS: Right Kidney:  Length: 10.7 cm. Echogenicity within normal limits. No mass or hydronephrosis visualized.  Left Kidney:  Length: 11.0 cm. The left kidney is partly obscured by bowel gas and suboptimally visualized. No evidence of hydronephrosis or renal mass.  Bladder:  Decompressed and not visualized. A small amount of pelvic ascites noted.  Of note, examination limited by body habitus.  IMPRESSION: Stable renal ultrasound demonstrating no hydronephrosis. The left kidney is not well visualized. Pelvic ascites noted.   Electronically Signed   By: Richardean Sale M.D.   On: 09/12/2014 13:12   Ct Maxillofacial Wo Cm  09/11/2014   CLINICAL DATA:  81 year old fell from bed to night stand. Swelling to the right orbit and cheek bone. Headache.  EXAM: CT HEAD WITHOUT CONTRAST  CT MAXILLOFACIAL WITHOUT CONTRAST  CT CERVICAL SPINE WITHOUT CONTRAST  TECHNIQUE:  Multidetector CT imaging of the head, cervical spine, and maxillofacial structures were performed using the standard protocol without intravenous contrast. Multiplanar CT image reconstructions of the cervical spine and maxillofacial structures were also generated.  COMPARISON:  08/24/2014  FINDINGS: CT HEAD FINDINGS  There is some motion artifact. There is stable mild cerebral atrophy. Stable subtle low-density in the white matter. No evidence for acute hemorrhage, mass lesion, midline shift, hydrocephalus or large infarct. Large amount of swelling in the right periorbital region, right cheek and right forehead. Mild mucosal thickening in the sphenoid sinuses. No evidence for a calvarial fracture.  CT MAXILLOFACIAL FINDINGS  Soft tissue swelling involving the right cheek, right periorbital region and right forehead. Globes are intact. Mandible is intact. The mandibular condyles  are located. Mild mucosal thickening in the sphenoid sinuses. No evidence for an acute facial bone fracture. Specifically, the pterygoid plates are intact. Degenerative facet disease in the cervical spine. Incidentally, there is periapical lucency involving the right lower second molar. The lucency or caries involving the right lower canine tooth.  CT CERVICAL SPINE FINDINGS  Limited evaluation of the lung apices. Negative for fracture or dislocation in cervical spine. No soft tissue swelling in the neck. Bilateral facet disease in the cervical spine. Bone detail in the lower neck at the cervicothoracic junction is limited. Overall alignment of cervical spine is normal. There is disc space narrowing at C6-C7 with bridging anterior osteophytes.  IMPRESSION: No acute intracranial abnormality. Mild atrophy and evidence for chronic small vessel ischemic changes.  Extensive soft tissue swelling on the right side of the face. No underlying fracture.  Multilevel degenerative changes in the cervical spine without acute bone abnormality.    Electronically Signed   By: Markus Daft M.D.   On: 09/11/2014 09:24    ASSESSMENT AND PLAN  1. Afib RVR: Pt presented 8/29 following fall from bed resulting to significant bruising and swelling to the right side of her face and eye. She was found to be back in afib with mild volume overload. Amio started 8/29 and currently she remains in rate-controlled afib in the 80's. Of note, she does have a h/o 1st deg avb and freq pac's but she is clearly in afib on tele.  F/U 12 lead. She has not been experiencing palpitations.  It was previously felt to be important to maintain sinus rhythm given diast chf. She was on xarelto @ home and this was switched to heparin on admission in case she developed significant bleeding in/around her eye. Her eye and face appear to be stable - less swollen today. Cont heparin.  She is lethargic and somewhat somnolent this AM.  If/when she is more responsive, could likely switch amio to 400mg  po bid.  If she remains lethargic, would keep with IV amio.  As she has not yet converted, she will likely require dccv prior to d/c.  2. Acute on chronic diastolic CHF: Pt had been noticing mild DOE, lower ext edema, and bendopnea since last Thursday. Wt was up a few lbs on admission. I/O inaccurate 2/2 urinary incontinence. Bed scale wt has her up 6 lbs since yesterday and 8 lbs since admission. BUN/Creat up since last hospitalization, pending this AM.  Her exam is complicated by obesity though she now has LEE that she did not have previously.  I will add IV lasix and order foley.  Repeat UA.  3. R subconjunctival hemorrhage: Seen by ophthalmology. No acute need for intervention. Stable on anticoagulation.  4. Acute on chronic stage VI kidney dzs: BUN/Creat has been up - labs pending this am.  5. Hyperkalemia: In setting of CKD IV. Given a second dose of kayexalate yesterday.  K pending this AM.  6.  AMS:  Pt very lethargic this AM.  BP's remain soft.  Noted to have  small volume urinary incontinence yesterday.  UA notable for many bacteria but nitrite neg and many squams - likely contaminated.  Place foley and repeat UA.  Repeat CXR.  Await labs.  Defer abx to IM.  Signed, Murray Hodgkins NP Labs were late this morning because of poor venous access but have now returned. Unfortunately creatinine continues to climb.Chest xray shows poor inspiratory effort but heart size stable and no definite CHF. Will hold further  lasix.  Would consider having nephrology see the patient.

## 2014-09-13 NOTE — Progress Notes (Signed)
Pt with incontinent urine and bowel episodes throughout the day yesterday, although pt able to stand and void. RN performed another bladder scan recently. Scan shows only 38 mL urine in bladder at this time. Will continue to monitor.

## 2014-09-13 NOTE — Progress Notes (Signed)
PROGRESS NOTE  [name] [name] WRU:[id number] DOB: [birth year] DOA: 09/11/2014 PCP: Cari Caraway, MD  Brief history 81 y.o. female, with diabetes and peripheral neuropathy, morbid obesity, recent hospitalization and cardioversion for atrial fibrillation with RVR who presents to the emergency department today after rolling out of bed and sustaining a trauma to her right eye. She was found to be in atrial fibrillation with RVR. She was discharged from [facility] on 8/11 and went to [facility] until 8/27 after which she went home with home health services. She reports rolling out of bed at approximately 6 am on the morning of admission and hitting her eye on the way down. Assessment/Plan: 1. Acute on chronic renal failure (CKD stage 3) -Baseline creatinine 1.2-1.5 -Serum creatinine continues to rise -Initially received intravenous dose of furosemide -Consult nephrology--spoke with Dr. Joelyn Oms -Hold furosemide, continue intravenous fluids -Renal ultrasound negative for hydronephrosis -Urinalysis negative for pyuria, sp grav 1.026   2. Paroxysmal Afib RVR [place] Vasc score of over 3 -  -Pressure cardiology - currently on amiodarone and heparin drip,  -closely monitor hematoma as above, continue bisoprolol. Cardiology and templating another cardioversion. -Patient recently had cardioversion 08/23/2014   3. Mech.Fall with R Eye Subconjunctival hemorrhage and periorbital and right-sided facial hematoma. Ophthalmologist Dr. Valetta Close consulted, he has seen the patient and placed her on erythromycin ointment along with supportive care. Hematoma is actually improving, continue gentle heparin drip and monitor. Initiate PT. Patient's sister confirms that her hematoma and swelling have considerably improved since yesterday on admission.  4. Hypotension -blood cultures x 2 -am cortisol -procalcitonin -lactic acid   5. Peripheral Neuropathy - on Neurontin and continue   6. Lower extremity  edema -Venous duplex without DVT   7. HTN - off of all antihypertensives secondary to hypotension -Discontinued ARB at the time of admission   8.COPD - no acute issues, no wheezing on exam. Continue home albuterol-2 nebs and Symbicort.  -Presently stable 2 L   9. DM 2 - oral medications held currently  -09/11/2014 hemoglobin A1c 9.3 -Continue Lantus 10 units daily -NovoLog sliding scale   Family Communication:   Sister updated at beside Disposition Plan:   SNF when medically stable       Procedures/Studies: Dg Chest 2 View  09/11/2014   CLINICAL DATA:  [place] today. Back and chest pain. Difficulty breathing.  EXAM: CHEST  2 VIEW  COMPARISON:  08/24/2014  FINDINGS: The heart is mildly enlarged but stable. The mediastinal and hilar contours are within normal limits and unchanged. No acute pulmonary findings. No pleural effusion. The bony thorax is intact.  IMPRESSION: Stable cardiac enlargement.  No acute pulmonary findings.   Electronically Signed   By: Marijo Sanes M.D.   On: 09/11/2014 13:52   Ct Head Wo Contrast  09/11/2014   CLINICAL DATA:  81 year old fell from bed to night stand. Swelling to the right orbit and cheek bone. Headache.  EXAM: CT HEAD WITHOUT CONTRAST  CT MAXILLOFACIAL WITHOUT CONTRAST  CT CERVICAL SPINE WITHOUT CONTRAST  TECHNIQUE: Multidetector CT imaging of the head, cervical spine, and maxillofacial structures were performed using the standard protocol without intravenous contrast. Multiplanar CT image reconstructions of the cervical spine and maxillofacial structures were also generated.  COMPARISON:  08/24/2014  FINDINGS: CT HEAD FINDINGS  There is some motion artifact. There is stable mild cerebral atrophy. Stable subtle low-density in the white matter. No evidence for acute hemorrhage, mass lesion, midline shift, hydrocephalus or  large infarct. Large amount of swelling in the right periorbital region, right cheek and right forehead. Mild mucosal thickening in  the sphenoid sinuses. No evidence for a calvarial fracture.  CT MAXILLOFACIAL FINDINGS  Soft tissue swelling involving the right cheek, right periorbital region and right forehead. Globes are intact. Mandible is intact. The mandibular condyles are located. Mild mucosal thickening in the sphenoid sinuses. No evidence for an acute facial bone fracture. Specifically, the pterygoid plates are intact. Degenerative facet disease in the cervical spine. Incidentally, there is periapical lucency involving the right lower second molar. The lucency or caries involving the right lower canine tooth.  CT CERVICAL SPINE FINDINGS  Limited evaluation of the lung apices. Negative for fracture or dislocation in cervical spine. No soft tissue swelling in the neck. Bilateral facet disease in the cervical spine. Bone detail in the lower neck at the cervicothoracic junction is limited. Overall alignment of cervical spine is normal. There is disc space narrowing at C6-C7 with bridging anterior osteophytes.  IMPRESSION: No acute intracranial abnormality. Mild atrophy and evidence for chronic small vessel ischemic changes.  Extensive soft tissue swelling on the right side of the face. No underlying fracture.  Multilevel degenerative changes in the cervical spine without acute bone abnormality.   Electronically Signed   By: Markus Daft M.D.   On: 09/11/2014 09:24   Ct Head Wo Contrast  08/24/2014   CLINICAL DATA:  81 year old female with altered mental status. Lethargy.  EXAM: CT HEAD WITHOUT CONTRAST  TECHNIQUE: Contiguous axial images were obtained from the base of the skull through the vertex without intravenous contrast.  COMPARISON:  No priors.  FINDINGS: Mild cerebral atrophy. Patchy and confluent areas of decreased attenuation are noted throughout the deep and periventricular white matter of the cerebral hemispheres bilaterally, compatible with chronic microvascular ischemic disease. No acute intracranial abnormalities. Specifically,  no evidence of acute intracranial hemorrhage, no definite findings of acute/subacute cerebral ischemia, no mass, mass effect, hydrocephalus or abnormal intra or extra-axial fluid collections. Visualized paranasal sinuses and mastoids are generally well pneumatized, with exception of extensive mucosal thickening in the sphenoid sinuses. No acute displaced skull fractures are identified.  IMPRESSION: 1. No acute intracranial abnormalities. 2. Mild cerebral atrophy and chronic microvascular ischemic changes in the cerebral white matter. 3. Extensive mucosal thickening in the sphenoid sinuses, extensive mucosal thickening in sphenoid sinuses. No air-fluid levels to suggest an acute sinusitis at this time.   Electronically Signed   By: Vinnie Langton M.D.   On: 08/24/2014 21:34   Ct Cervical Spine Wo Contrast  09/11/2014   CLINICAL DATA:  81 year old fell from bed to night stand. Swelling to the right orbit and cheek bone. Headache.  EXAM: CT HEAD WITHOUT CONTRAST  CT MAXILLOFACIAL WITHOUT CONTRAST  CT CERVICAL SPINE WITHOUT CONTRAST  TECHNIQUE: Multidetector CT imaging of the head, cervical spine, and maxillofacial structures were performed using the standard protocol without intravenous contrast. Multiplanar CT image reconstructions of the cervical spine and maxillofacial structures were also generated.  COMPARISON:  08/24/2014  FINDINGS: CT HEAD FINDINGS  There is some motion artifact. There is stable mild cerebral atrophy. Stable subtle low-density in the white matter. No evidence for acute hemorrhage, mass lesion, midline shift, hydrocephalus or large infarct. Large amount of swelling in the right periorbital region, right cheek and right forehead. Mild mucosal thickening in the sphenoid sinuses. No evidence for a calvarial fracture.  CT MAXILLOFACIAL FINDINGS  Soft tissue swelling involving the right cheek, right periorbital region and right  forehead. Globes are intact. Mandible is intact. The mandibular  condyles are located. Mild mucosal thickening in the sphenoid sinuses. No evidence for an acute facial bone fracture. Specifically, the pterygoid plates are intact. Degenerative facet disease in the cervical spine. Incidentally, there is periapical lucency involving the right lower second molar. The lucency or caries involving the right lower canine tooth.  CT CERVICAL SPINE FINDINGS  Limited evaluation of the lung apices. Negative for fracture or dislocation in cervical spine. No soft tissue swelling in the neck. Bilateral facet disease in the cervical spine. Bone detail in the lower neck at the cervicothoracic junction is limited. Overall alignment of cervical spine is normal. There is disc space narrowing at C6-C7 with bridging anterior osteophytes.  IMPRESSION: No acute intracranial abnormality. Mild atrophy and evidence for chronic small vessel ischemic changes.  Extensive soft tissue swelling on the right side of the face. No underlying fracture.  Multilevel degenerative changes in the cervical spine without acute bone abnormality.   Electronically Signed   By: Markus Daft M.D.   On: 09/11/2014 09:24   Mr Brain Wo Contrast  08/21/2014   CLINICAL DATA:  Mental status changes. Rapid atrial fibrillation. Confusion. Memory loss.  EXAM: MRI HEAD WITHOUT CONTRAST  TECHNIQUE: Multiplanar, multiecho pulse sequences of the brain and surrounding structures were obtained without intravenous contrast.  COMPARISON:  None.  FINDINGS: The diffusion-weighted images demonstrate no evidence for acute or subacute infarction.  The study is moderately degraded by patient motion. Moderate atrophy and periventricular white matter change bilaterally is somewhat advanced for age. No acute infarct, hemorrhage, or mass lesion is present. The ventricles are of normal size. No significant extra-axial fluid collections are present.  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 White matter changes extend into the brainstem.   Flow is present in the major intracranial arteries. Bilateral lens replacements are present. Circumferential mucosal thickening is present in the posterior sphenoid sinus bilaterally. The remaining paranasal sinuses and the mastoid air cells are clear.  Midline structures are within normal limits.  IMPRESSION: 1. No acute intracranial abnormality. 2. Age advanced atrophy and white matter disease. This likely reflects the sequela of chronic microvascular ischemia.   Electronically Signed   By: San Morelle M.D.   On: 08/21/2014 14:28   US Renal  09/12/2014   CLINICAL DATA:  Acute renal failure. History of hypertension and diabetes. Subsequent encounter.  EXAM: RENAL / URINARY TRACT ULTRASOUND COMPLETE  COMPARISON:  Renal ultrasound 08/21/2014.  FINDINGS: Right Kidney:  Length: 10.7 cm. Echogenicity within normal limits. No mass or hydronephrosis visualized.  Left Kidney:  Length: 11.0 cm. The left kidney is partly obscured by bowel gas and suboptimally visualized. No evidence of hydronephrosis or renal mass.  Bladder:  Decompressed and not visualized. A small amount of pelvic ascites noted.  Of note, examination limited by body habitus.  IMPRESSION: Stable renal ultrasound demonstrating no hydronephrosis. The left kidney is not well visualized. Pelvic ascites noted.   Electronically Signed   By: Richardean Sale M.D.   On: 09/12/2014 13:12   US Renal  08/21/2014   CLINICAL DATA:  81 year old female with acute renal injury. Initial encounter.  EXAM: RENAL / URINARY TRACT ULTRASOUND COMPLETE  COMPARISON:  Lumbar spine MRI 01/11/2013.  Chest CTA 02/01/2014.  FINDINGS: Right Kidney:  Length: 10.6 cm. No hydronephrosis. Cortical echogenicity within normal limits. No right renal mass.  Left Kidney:  Length: 10.1 cm. No hydronephrosis. Cortical echotexture within normal limits. No left  renal mass.  Bladder:  Decompressed, Foley catheter balloon visible.  IMPRESSION: No hydronephrosis or acute renal findings.    Electronically Signed   By: Genevie Ann M.D.   On: 08/21/2014 16:13   Dg Chest Port 1 View  09/13/2014   CLINICAL DATA:  Congestive heart failure, shortness of breath.  EXAM: PORTABLE CHEST - 1 VIEW  COMPARISON:  09/11/2014 and CT chest 02/09/2014.  FINDINGS: Trachea is midline. Heart size stable. Lungs are somewhat low in volume with probable bibasilar atelectasis. No airspace consolidation or pleural fluid.  IMPRESSION: Low lung volumes with probable bibasilar atelectasis.   Electronically Signed   By: Lorin Picket M.D.   On: 09/13/2014 09:47   Dg Chest Port 1 View  08/24/2014   CLINICAL DATA:  Pt c/o being "tired and cold" x1 day. Pt was released from [facility] this morning after being treated for A.Fib.Pt denies pain and SOB.Hx of HTN, DM, mild aortic stenosis, PNA, bronchitis.Nonsmoker.  EXAM: PORTABLE CHEST - 1 VIEW  COMPARISON:  08/18/2014  FINDINGS: Mild enlargement of the cardiac silhouette. No mediastinal or hilar masses or evidence of adenopathy.  Clear lungs.  No pleural effusion or pneumothorax.  Bony thorax is demineralized but grossly intact.  IMPRESSION: No active disease.   Electronically Signed   By: Lajean Manes M.D.   On: 08/24/2014 19:32   Dg Chest Port 1 View  08/18/2014   CLINICAL DATA:  Shortness of breath today with exertion.  EXAM: PORTABLE CHEST - 1 VIEW  COMPARISON:  03/31/2014  FINDINGS: Heart is borderline in size. No confluent airspace opacities, effusions or edema. No acute bony abnormality.  IMPRESSION: No active disease.   Electronically Signed   By: Rolm Baptise M.D.   On: 08/18/2014 11:39   Ct Maxillofacial Wo Cm  09/11/2014   CLINICAL DATA:  81 year old fell from bed to night stand. Swelling to the right orbit and cheek bone. Headache.  EXAM: CT HEAD WITHOUT CONTRAST  CT MAXILLOFACIAL WITHOUT CONTRAST  CT CERVICAL SPINE WITHOUT CONTRAST  TECHNIQUE: Multidetector CT imaging of the head, cervical spine, and maxillofacial structures were performed using the standard  protocol without intravenous contrast. Multiplanar CT image reconstructions of the cervical spine and maxillofacial structures were also generated.  COMPARISON:  08/24/2014  FINDINGS: CT HEAD FINDINGS  There is some motion artifact. There is stable mild cerebral atrophy. Stable subtle low-density in the white matter. No evidence for acute hemorrhage, mass lesion, midline shift, hydrocephalus or large infarct. Large amount of swelling in the right periorbital region, right cheek and right forehead. Mild mucosal thickening in the sphenoid sinuses. No evidence for a calvarial fracture.  CT MAXILLOFACIAL FINDINGS  Soft tissue swelling involving the right cheek, right periorbital region and right forehead. Globes are intact. Mandible is intact. The mandibular condyles are located. Mild mucosal thickening in the sphenoid sinuses. No evidence for an acute facial bone fracture. Specifically, the pterygoid plates are intact. Degenerative facet disease in the cervical spine. Incidentally, there is periapical lucency involving the right lower second molar. The lucency or caries involving the right lower canine tooth.  CT CERVICAL SPINE FINDINGS  Limited evaluation of the lung apices. Negative for fracture or dislocation in cervical spine. No soft tissue swelling in the neck. Bilateral facet disease in the cervical spine. Bone detail in the lower neck at the cervicothoracic junction is limited. Overall alignment of cervical spine is normal. There is disc space narrowing at C6-C7 with bridging anterior osteophytes.  IMPRESSION: No acute intracranial  abnormality. Mild atrophy and evidence for chronic small vessel ischemic changes.  Extensive soft tissue swelling on the right side of the face. No underlying fracture.  Multilevel degenerative changes in the cervical spine without acute bone abnormality.   Electronically Signed   By: Markus Daft M.D.   On: 09/11/2014 09:24         Subjective: Patient denies any headache,  chest pain, dizziness, nausea, vomiting, diarrhea, abdominal pain, dysuria. She has some mild dyspnea which is improved since admission. Denies any coughing, hemoptysis, hematochezia, melena.  Objective: Filed Vitals:   09/13/14 0119 09/13/14 0440 09/13/14 1050 09/13/14 1053  BP: 92/50 85/51  81/41  Pulse:  81 79 80  Temp:  97.8 F (36.6 C)    TempSrc:  Axillary  Oral  Resp:  18 16   Height:      Weight:  110 kg (242 lb 8.1 oz)    SpO2: 100% 100% 97%     Intake/Output Summary (Last 24 hours) at 09/13/14 1334 Last data filed at 09/13/14 0859  Gross per 24 hour  Intake 3369.52 ml  Output     50 ml  Net 3319.52 ml   Weight change: 10.209 kg (22 lb 8.1 oz) Exam:   General:  Pt is alert, follows commands appropriately, not in acute distress  HEENT: No icterus, No thrush, No neck mass, [place]/AT  Cardiovascular: RRR, S1/S2, no rubs, no gallops  Respiratory: Bibasilar crackles. No wheezing  Abdomen: Soft/+BS, non tender, non distended, no guarding; no hepatosplenomegaly  Extremities: trace LE edema, No lymphangitis, No petechiae, No rashes, no synovitis; no cyanosis or clubbing  Data Reviewed: Basic Metabolic Panel:  Recent Labs Lab 09/11/14 1501 09/11/14 1909 09/12/14 0125 09/12/14 1421 09/13/14 0936  NA 138 135 133* 135 137  K 6.1* 6.2* 5.7* 5.6* 4.7  CL 103 100* 99* 102 102  CO2 27 26 23  21* 23  GLUCOSE 165* 178* 251* 237* 173*  BUN 82* 79* 84* 89* 88*  CREATININE 1.91* 2.01* 2.00* 2.42* 3.00*  CALCIUM 8.8* 8.7* 8.5* 8.5* 8.2*   Liver Function Tests: No results for input(s): AST, ALT, ALKPHOS, BILITOT, PROT, ALBUMIN in the last 168 hours. No results for input(s): LIPASE, AMYLASE in the last 168 hours. No results for input(s): AMMONIA in the last 168 hours. CBC:  Recent Labs Lab 09/11/14 0825 09/12/14 0125 09/13/14 0936  WBC 12.8* 11.3* 10.4  NEUTROABS 10.2*  --   --   HGB 9.6* 9.7* 9.7*  HCT 32.0* 32.8* 32.8*  MCV 88.9 90.4 92.9  PLT 296 302 176    Cardiac Enzymes:  Recent Labs Lab 09/11/14 1501 09/11/14 1909 09/12/14 0125  TROPONINI 0.03 0.03 0.03   BNP: Invalid input(s): POCBNP CBG:  Recent Labs Lab 09/12/14 1235 09/12/14 1750 09/12/14 2206 09/13/14 0545 09/13/14 1136  GLUCAP 183* 171* 174* 149* 153*    Recent Results (from the past 240 hour(s))  MRSA PCR Screening     Status: Abnormal   Collection Time: 09/11/14  7:09 PM  Result Value Ref Range Status   MRSA by PCR POSITIVE (A) NEGATIVE Final    Comment:        The GeneXpert MRSA Assay (FDA approved for NASAL specimens only), is one component of a comprehensive MRSA colonization surveillance program. It is not intended to diagnose MRSA infection nor to guide or monitor treatment for MRSA infections. RESULT CALLED TO, READ BACK BY AND VERIFIED WITH: D HART RN 2115 09/11/14 A BROWNING  Scheduled Meds: . amiodarone  150 mg Intravenous Once  . bisoprolol  7.5 mg Oral Daily  . budesonide-formoterol  2 puff Inhalation BID  . calcium citrate  200 mg of elemental calcium Oral Daily  . Chlorhexidine Gluconate Cloth  6 each Topical Q0600  . cyanocobalamin  1,000 mcg Intramuscular Weekly  . erythromycin   Right Eye TID  . famotidine  20 mg Oral Daily  . feeding supplement (ENSURE ENLIVE)  237 mL Oral BID BM  . folic acid  1 mg Oral Daily  . gabapentin  300 mg Oral BID  . insulin aspart  0-9 Units Subcutaneous TID WC  . insulin glargine  10 Units Subcutaneous QHS  . mupirocin ointment  1 application Nasal BID  . nystatin cream   Topical BID  . pantoprazole  40 mg Oral q morning - 10a  . sodium chloride  3 mL Intravenous Q12H   Continuous Infusions: . amiodarone 30 mg/hr (09/13/14 1201)  . heparin 850 Units/hr (09/13/14 1201)     Whitleigh Garramone, DO  [facility] Pager [phone]  If 7PM-7AM, please contact night-coverage [email or web address] Password [email or web address] 09/13/2014, 1:34 PM   LOS: 2 days

## 2014-09-13 NOTE — Consult Note (Signed)
Reason for Consult:acute on chronic kidney disease Referring Physician: Dr. Arnoldo Morale is an 81 y.o. female.  HPI: [name] [name] is an 81 year old female with CKD 3, a-fib with RVR s/p DCCV, DM2 complicated by neuropathy, obesity, chronic diastolic CHF, COPD hospitalized on 8/29 for R subconjuctival hemorrhage 2/2 falling out of bed at [facility]. She was hospitalized earlier this month for a-fib with RVR and was initially started on dilitiazem gtt but eventually underwent cardioversion. She was also found to have Klebsiella UTI which was treated with ceftriaxone 3 days. Her creatinine trended baseline around 1 and increased to 1.6 thought to be in the setting of low blood pressure at which time diltiazem was discontinued, and her losartan dose was decreased from 25 mg twice daily. At the time of discharge 8/11, her creatinine remained above baseline at 1.4, and her losartan was continued at 50 mg twice daily along with her other cardiac medications, Lasix and bisoprolol. She returned to the ED later that day with bradycardia and had her beta blocker dose decreased. Since admission, her creatinine has trended up from 1.9 to 3.0 today, and her urine output has subsequently decreased as well. Renal ultrasound was reassuring for no hydronephrosis, and Lasix and losartan were held given her intermittent hypotension. She otherwise denies any difficulty urinating though reports poor appetite, and her sister reports that her mental status has still not returned to baseline.  PMH:   Past Medical History  Diagnosis Date  . Mild aortic stenosis     a. 10/2010 Echo: Ef 60-65%, no rwma, mild LVH no AS;  b. 08/2014 Echo: mild to mod AS.  Marland Kitchen Chronic back pain   . Peripheral neuropathy   . GERD (gastroesophageal reflux disease)   . Essential hypertension   . History of pneumonia   . History of bronchitis   . Basal cell carcinoma     a. 2013.  . Non-cardiac chest pain     a. 12/2013 LHC w/ nl coronaries and nl  EF 60%.  . Frequent PAC's   . Heart murmur   . Type II diabetes mellitus   . Atrial fibrillation with RVR     a. 08/2014 s/p TEE/DCCV;  b. CHA2DS2VASc = 5-->Xarelto.  . Chronic diastolic CHF (congestive heart failure)     a. 08/2014 Echo: EF 55-60%, mild to mod AS, mild MR, mod dil LA/RA, mildly dil RV, PASP 57mHg.  . Morbid obesity     PSH:   Past Surgical History  Procedure Laterality Date  . Vesicovaginal fistula closure w/ tah  1964  . UKoreaechocardiography  04-26-09    EF 55-60%  . Cardiovascular stress test  05-13-2005    EF 67%  . Knee surgery    . Joint replacement  2000    left knee  . Abdominal hysterectomy  1964  . Toe shortened      right 2nd toe  . Cataract extraction      both eyes  . Mass excision  01/13/2012    Procedure: EXCISION MASS;  Surgeon: ARalene Ok MD;  Location: [facility];  Service: General;  Laterality: Right;  Excision of Right Back Mass  . Left heart catheterization with coronary angiogram N/A 01/03/2014    Procedure: LEFT HEART CATHETERIZATION WITH CORONARY ANGIOGRAM;  Surgeon: JLorretta Harp MD;  [facility];  Service: Cardiovascular;  Laterality: N/A;  . Cardiac catheterization    . Eye surgery      cataract  .  Tee without cardioversion N/A 08/23/2014    Procedure: TRANSESOPHAGEAL ECHOCARDIOGRAM (TEE);  Surgeon: Jerline Pain, MD;  Location: [place];  Service: Cardiovascular;  Laterality: N/A;  . Cardioversion N/A 08/23/2014    Procedure: CARDIOVERSION;  Surgeon: Jerline Pain, MD;  Location: [facility] ENDOSCOPY;  Service: Cardiovascular;  Laterality: N/A;    Allergies:  Allergies  Allergen Reactions  . Codeine Nausea And Vomiting  . Demerol Other (See Comments)    Drunk feeling"  . Doxycycline Nausea And Vomiting  . Statins Other (See Comments)    "hurt"   . Zetia [Ezetimibe] Other (See Comments)    hurt    Medications:   Prior to Admission medications   Medication Sig Start Date End Date Taking? Authorizing Provider   acetaminophen (TYLENOL) 325 MG tablet Take 2 tablets (650 mg total) by mouth every 6 (six) hours as needed for mild pain (or Fever >/= 101). 08/24/14  Yes Albertine Patricia, MD  albuterol (PROVENTIL HFA;VENTOLIN HFA) 108 (90 BASE) MCG/ACT inhaler Inhale 1 puff into the lungs every 6 (six) hours as needed for wheezing or shortness of breath.   Yes Historical Provider, MD  BIOTIN PO Take 1 tablet by mouth daily. 1,000 mg daily   Yes Historical Provider, MD  bisoprolol (ZEBETA) 5 MG tablet Take 1.5 tablets (7.5 mg total) by mouth daily. 08/24/14  Yes Deno Etienne, DO  budesonide-formoterol (SYMBICORT) 80-4.5 MCG/ACT inhaler Inhale 2 puffs into the lungs 2 (two) times daily. 04/12/14  Yes Tanda Rockers, MD  calcium citrate (CALCITRATE - DOSED IN MG ELEMENTAL CALCIUM) 950 MG tablet Take 200 mg of elemental calcium by mouth daily.   Yes Historical Provider, MD  cholecalciferol (VITAMIN D) 1000 UNITS tablet Take 1,000 Units by mouth daily.   Yes Historical Provider, MD  cyanocobalamin (,VITAMIN B-12,) 1000 MCG/ML injection Inject 1 mL (1,000 mcg total) into the muscle once a week. 08/24/14  Yes Albertine Patricia, MD  famotidine (PEPCID) 20 MG tablet Take 20 mg by mouth daily.   Yes Historical Provider, MD  feeding supplement, ENSURE ENLIVE, (ENSURE ENLIVE) LIQD Take 237 mLs by mouth 2 (two) times daily between meals. 08/24/14  Yes Albertine Patricia, MD  folic acid (FOLVITE) 1 MG tablet Take 1 tablet (1 mg total) by mouth daily. 08/24/14  Yes Albertine Patricia, MD  furosemide (LASIX) 40 MG tablet Take 40 mg by mouth daily.   Yes Historical Provider, MD  gabapentin (NEURONTIN) 300 MG capsule Take 2 capsules (600 mg total) by mouth 2 (two) times daily. 02/05/14  Yes Orson Eva, MD  glimepiride (AMARYL) 2 MG tablet Take 2 mg by mouth daily with breakfast.   Yes Historical Provider, MD  hydrOXYzine (ATARAX/VISTARIL) 10 MG tablet Take 10 mg by mouth every 12 (twelve) hours as needed.   Yes Historical Provider, MD   ipratropium-albuterol (DUONEB) 0.5-2.5 (3) MG/3ML SOLN Take 3 mLs by nebulization every 6 (six) hours as needed.    Yes Historical Provider, MD  losartan (COZAAR) 50 MG tablet Take 25 mg by mouth daily.  03/23/14  Yes Historical Provider, MD  Melatonin 3 MG TABS Take 3 mg by mouth at bedtime.   Yes Historical Provider, MD  metFORMIN (GLUCOPHAGE) 1000 MG tablet Take 1 tablet (1,000 mg total) by mouth 2 (two) times daily with a meal. 01/06/14  Yes Brittainy Erie Noe, PA-C  nitroGLYCERIN (NITROSTAT) 0.4 MG SL tablet Place 1 tablet (0.4 mg total) under the tongue every 5 (five) minutes as needed for chest  pain. 01/02/14  Yes Darlin Coco, MD  pantoprazole (PROTONIX) 40 MG tablet Take 40 mg by mouth every morning.    Yes Historical Provider, MD  rivaroxaban (XARELTO) 20 MG TABS tablet Take 1 tablet (20 mg total) by mouth daily with supper. 08/24/14  Yes Silver Huguenin Elgergawy, MD  rosuvastatin (CRESTOR) 5 MG tablet Take 5 mg by mouth at bedtime.   Yes Historical Provider, MD  Spacer/Aero-Holding Chambers (AEROCHAMBER MV) inhaler Use as instructed 03/14/14  Yes Tanda Rockers, MD    Discontinued Meds:   Medications Discontinued During This Encounter  Medication Reason  . famotidine (PEPCID) IVPB 20 mg premix   . diphenhydrAMINE (BENADRYL) capsule 25 mg   . methylPREDNISolone sodium succinate (SOLU-MEDROL) 125 mg/2 mL injection 125 mg   . Melatonin TABS 3 mg Inpatient Standard  . acetaminophen (TYLENOL) tablet 650 mg   . acetaminophen (TYLENOL) suppository 650 mg   . ondansetron (ZOFRAN) tablet 4 mg   . sodium chloride 0.9 % injection 3 mL   . sodium chloride 0.9 % injection 3 mL   . 0.9 %  sodium chloride infusion   . heparin ADULT infusion 100 units/mL (25000 units/250 mL)   . furosemide (LASIX) injection 80 mg     Social History:  reports that she quit smoking about 34 years ago. Her smoking use included Cigarettes. She has a 2.5 pack-year smoking history. She has never used smokeless tobacco.  She reports that she does not drink alcohol or use illicit drugs.  Family History:   Family History  Problem Relation Age of Onset  . Other Mother     died @ 10, complications following childbirth  . COPD Father     died @ 14  . Heart failure Father   . Irregular heart beat Sister     1 sister with PPM.   As noted in the history of present illness  CREATININE, SER  Date/Time Value Ref Range Status  09/13/2014 09:36 AM 3.00* 0.44 - 1.00 mg/dL Final  09/12/2014 02:21 PM 2.42* 0.44 - 1.00 mg/dL Final  09/12/2014 01:25 AM 2.00* 0.44 - 1.00 mg/dL Final  09/11/2014 07:09 PM 2.01* 0.44 - 1.00 mg/dL Final  09/11/2014 03:01 PM 1.91* 0.44 - 1.00 mg/dL Final  09/11/2014 01:04 PM 1.87* 0.44 - 1.00 mg/dL Final  09/11/2014 08:25 AM 1.85* 0.44 - 1.00 mg/dL Final  08/24/2014 07:19 PM 1.40* 0.44 - 1.00 mg/dL Final  08/24/2014 07:10 PM 1.39* 0.44 - 1.00 mg/dL Final  08/23/2014 12:40 PM 1.23* 0.44 - 1.00 mg/dL Final  08/23/2014 03:48 AM 1.29* 0.44 - 1.00 mg/dL Final  08/22/2014 04:20 AM 1.30* 0.44 - 1.00 mg/dL Final  08/21/2014 12:39 PM 1.21* 0.44 - 1.00 mg/dL Final  08/21/2014 05:51 AM 1.39* 0.44 - 1.00 mg/dL Final  08/20/2014 03:33 AM 1.56* 0.44 - 1.00 mg/dL Final  08/19/2014 02:25 AM 0.99 0.44 - 1.00 mg/dL Final  08/18/2014 11:15 AM 0.89 0.44 - 1.00 mg/dL Final  02/06/2014 06:04 AM 0.91 0.50 - 1.10 mg/dL Final  02/04/2014 04:47 AM 0.93 0.50 - 1.10 mg/dL Final  02/03/2014 06:00 AM 1.19* 0.50 - 1.10 mg/dL Final  02/02/2014 06:51 AM 1.02 0.50 - 1.10 mg/dL Final  02/01/2014 05:30 PM 1.19* 0.50 - 1.10 mg/dL Final  01/11/2014 06:10 AM 1.18* 0.50 - 1.10 mg/dL Final  01/10/2014 07:03 AM 1.02 0.50 - 1.10 mg/dL Final  01/09/2014 07:38 PM 1.02 0.50 - 1.10 mg/dL Final  01/03/2014 12:00 PM 0.93 0.50 - 1.10 mg/dL Final  01/02/2014 03:40 PM 0.9  0.4 - 1.2 mg/dL Final  06/23/2013 07:39 PM 1.03 0.50 - 1.10 mg/dL Final  01/08/2012 02:30 PM 0.88 0.50 - 1.10 mg/dL Final    Recent Labs Lab 09/11/14 0825  09/11/14 1304 09/11/14 1501 09/11/14 1909 09/12/14 0125 09/12/14 1421 09/13/14 0936  NA 135 137 138 135 133* 135 137  K 5.8* 5.7* 6.1* 6.2* 5.7* 5.6* 4.7  CL 102 104 103 100* 99* 102 102  CO2 _0 21* 23  GLUCOSE 113* 156* 165* 178* 251* 237* 173*  BUN 85* 82* 82* 79* 84* 89* 88*  CREATININE 1.85* 1.87* 1.91* 2.01* 2.00* 2.42* 3.00*  CALCIUM 8.9 8.8* 8.8* 8.7* 8.5* 8.5* 8.2*    Recent Labs Lab 09/11/14 0825 09/12/14 0125 09/13/14 0936  WBC 12.8* 11.3* 10.4  NEUTROABS 10.2*  --   --   HGB 9.6* 9.7* 9.7*  HCT 32.0* 32.8* 32.8*  MCV 88.9 90.4 92.9  PLT 296 302 176   Liver Function Tests: No results for input(s): AST, ALT, ALKPHOS, BILITOT, PROT, ALBUMIN in the last 168 hours. No results for input(s): LIPASE, AMYLASE in the last 168 hours. No results for input(s): AMMONIA in the last 168 hours. Cardiac Enzymes:  Recent Labs Lab 09/11/14 1501 09/11/14 1909 09/12/14 0125  TROPONINI 0.03 0.03 0.03   Iron Studies: No results for input(s): IRON, TIBC, TRANSFERRIN, FERRITIN in the last 72 hours.  Results for orders placed or performed during the hospital encounter of 09/11/14 (from the past 48 hour(s))  TSH     Status: None   Collection Time: 09/11/14  3:01 PM  Result Value Ref Range   TSH 1.746 0.350 - 4.500 uIU/mL  Troponin I     Status: None   Collection Time: 09/11/14  3:01 PM  Result Value Ref Range   Troponin I 0.03 <0.031 ng/mL    Comment:        NO INDICATION OF MYOCARDIAL INJURY.   Hemoglobin A1c     Status: Abnormal   Collection Time: 09/11/14  3:01 PM  Result Value Ref Range   Hgb A1c MFr Bld 9.3 (H) 4.8 - 5.6 %    Comment: (NOTE)         Pre-diabetes: 5.7 - 6.4         Diabetes: >6.4         Glycemic control for adults with diabetes: <7.0    Mean Plasma Glucose 220 mg/dL    Comment: (NOTE) Performed At: [facility] [place], [place] [id number] Lindon Romp MD CB:[id number]   Basic metabolic panel      Status: Abnormal   Collection Time: 09/11/14  3:01 PM  Result Value Ref Range   Sodium 138 135 - 145 mmol/L   Potassium 6.1 (HH) 3.5 - 5.1 mmol/L    Comment: NO VISIBLE HEMOLYSIS CRITICAL RESULT CALLED TO, READ BACK BY AND VERIFIED WITH: MAGBITAND,A RN _1  BY GRINSTEAD,C 8.29.16    Chloride 103 101 - 111 mmol/L   CO2 27 22 - 32 mmol/L   Glucose, Bld 165 (H) 65 - 99 mg/dL   BUN 82 (H) 6 - 20 mg/dL   Creatinine, Ser 1.91 (H) 0.44 - 1.00 mg/dL   Calcium 8.8 (L) 8.9 - 10.3 mg/dL   GFR calc non Af Amer 23 (L) >60 mL/min   GFR calc Af Amer 27 (L) >60 mL/min    Comment: (NOTE) The eGFR has been calculated using the CKD EPI equation. This calculation has not been validated in  all clinical situations. eGFR's persistently <60 mL/min signify possible Chronic Kidney Disease.    Anion gap 8 5 - 15  Glucose, capillary     Status: Abnormal   Collection Time: 09/11/14  5:02 PM  Result Value Ref Range   Glucose-Capillary 134 (H) 65 - 99 mg/dL  Basic metabolic panel     Status: Abnormal   Collection Time: 09/11/14  7:09 PM  Result Value Ref Range   Sodium 135 135 - 145 mmol/L   Potassium 6.2 (HH) 3.5 - 5.1 mmol/L    Comment: NO VISIBLE HEMOLYSIS CRITICAL RESULT CALLED TO, READ BACK BY AND VERIFIED WITH: D HART,RN 2024 09/11/2014 WBOND    Chloride 100 (L) 101 - 111 mmol/L   CO2 26 22 - 32 mmol/L   Glucose, Bld 178 (H) 65 - 99 mg/dL   BUN 79 (H) 6 - 20 mg/dL   Creatinine, Ser 2.01 (H) 0.44 - 1.00 mg/dL   Calcium 8.7 (L) 8.9 - 10.3 mg/dL   GFR calc non Af Amer 22 (L) >60 mL/min   GFR calc Af Amer 25 (L) >60 mL/min    Comment: (NOTE) The eGFR has been calculated using the CKD EPI equation. This calculation has not been validated in all clinical situations. eGFR's persistently <60 mL/min signify possible Chronic Kidney Disease.    Anion gap 9 5 - 15  MRSA PCR Screening     Status: Abnormal   Collection Time: 09/11/14  7:09 PM  Result Value Ref Range   MRSA by PCR POSITIVE (A)  NEGATIVE    Comment:        The GeneXpert MRSA Assay (FDA approved for NASAL specimens only), is one component of a comprehensive MRSA colonization surveillance program. It is not intended to diagnose MRSA infection nor to guide or monitor treatment for MRSA infections. RESULT CALLED TO, READ BACK BY AND VERIFIED WITH: D HART RN 2115 09/11/14 A BROWNING   Troponin I     Status: None   Collection Time: 09/11/14  7:09 PM  Result Value Ref Range   Troponin I 0.03 <0.031 ng/mL    Comment:        NO INDICATION OF MYOCARDIAL INJURY.   Glucose, capillary     Status: Abnormal   Collection Time: 09/11/14  9:33 PM  Result Value Ref Range   Glucose-Capillary 171 (H) 65 - 99 mg/dL  Troponin I     Status: None   Collection Time: 09/12/14  1:25 AM  Result Value Ref Range   Troponin I 0.03 <0.031 ng/mL    Comment:        NO INDICATION OF MYOCARDIAL INJURY.   Heparin level (unfractionated)     Status: Abnormal   Collection Time: 09/12/14  1:25 AM  Result Value Ref Range   Heparin Unfractionated >2.20 (H) 0.30 - 0.70 IU/mL    Comment: RESULTS CONFIRMED BY MANUAL DILUTION        IF HEPARIN RESULTS ARE BELOW EXPECTED VALUES, AND PATIENT DOSAGE HAS BEEN CONFIRMED, SUGGEST FOLLOW UP TESTING OF ANTITHROMBIN III LEVELS.   Basic metabolic panel     Status: Abnormal   Collection Time: 09/12/14  1:25 AM  Result Value Ref Range   Sodium 133 (L) 135 - 145 mmol/L   Potassium 5.7 (H) 3.5 - 5.1 mmol/L   Chloride 99 (L) 101 - 111 mmol/L   CO2 23 22 - 32 mmol/L   Glucose, Bld 251 (H) 65 - 99 mg/dL   BUN 84 (H) 6 -  20 mg/dL   Creatinine, Ser 2.00 (H) 0.44 - 1.00 mg/dL   Calcium 8.5 (L) 8.9 - 10.3 mg/dL   GFR calc non Af Amer 22 (L) >60 mL/min   GFR calc Af Amer 26 (L) >60 mL/min    Comment: (NOTE) The eGFR has been calculated using the CKD EPI equation. This calculation has not been validated in all clinical situations. eGFR's persistently <60 mL/min signify possible Chronic  Kidney Disease.    Anion gap 11 5 - 15  CBC     Status: Abnormal   Collection Time: 09/12/14  1:25 AM  Result Value Ref Range   WBC 11.3 (H) 4.0 - 10.5 K/uL   RBC 3.63 (L) 3.87 - 5.11 MIL/uL   Hemoglobin 9.7 (L) 12.0 - 15.0 g/dL   HCT 32.8 (L) 36.0 - 46.0 %   MCV 90.4 78.0 - 100.0 fL   MCH 26.7 26.0 - 34.0 pg   MCHC 29.6 (L) 30.0 - 36.0 g/dL   RDW 16.1 (H) 11.5 - 15.5 %   Platelets 302 150 - 400 K/uL  APTT     Status: Abnormal   Collection Time: 09/12/14  1:25 AM  Result Value Ref Range   aPTT 57 (H) 24 - 37 seconds    Comment:        IF BASELINE aPTT IS ELEVATED, SUGGEST PATIENT RISK ASSESSMENT BE USED TO DETERMINE APPROPRIATE ANTICOAGULANT THERAPY.   Glucose, capillary     Status: Abnormal   Collection Time: 09/12/14  6:08 AM  Result Value Ref Range   Glucose-Capillary 246 (H) 65 - 99 mg/dL  APTT     Status: Abnormal   Collection Time: 09/12/14  7:56 AM  Result Value Ref Range   aPTT 120 (H) 24 - 37 seconds    Comment:        IF BASELINE aPTT IS ELEVATED, SUGGEST PATIENT RISK ASSESSMENT BE USED TO DETERMINE APPROPRIATE ANTICOAGULANT THERAPY.   Glucose, capillary     Status: Abnormal   Collection Time: 09/12/14 12:35 PM  Result Value Ref Range   Glucose-Capillary 183 (H) 65 - 99 mg/dL   Comment 1 Document in Chart   Basic metabolic panel     Status: Abnormal   Collection Time: 09/12/14  2:21 PM  Result Value Ref Range   Sodium 135 135 - 145 mmol/L   Potassium 5.6 (H) 3.5 - 5.1 mmol/L   Chloride 102 101 - 111 mmol/L   CO2 21 (L) 22 - 32 mmol/L   Glucose, Bld 237 (H) 65 - 99 mg/dL   BUN 89 (H) 6 - 20 mg/dL   Creatinine, Ser 2.42 (H) 0.44 - 1.00 mg/dL   Calcium 8.5 (L) 8.9 - 10.3 mg/dL   GFR calc non Af Amer 18 (L) >60 mL/min   GFR calc Af Amer 20 (L) >60 mL/min    Comment: (NOTE) The eGFR has been calculated using the CKD EPI equation. This calculation has not been validated in all clinical situations. eGFR's persistently <60 mL/min signify possible Chronic  Kidney Disease.    Anion gap 12 5 - 15  Glucose, capillary     Status: Abnormal   Collection Time: 09/12/14  5:50 PM  Result Value Ref Range   Glucose-Capillary 171 (H) 65 - 99 mg/dL   Comment 1 Document in Chart   APTT     Status: Abnormal   Collection Time: 09/12/14  8:25 PM  Result Value Ref Range   aPTT 159 (H) 24 - 37 seconds  Comment:        IF BASELINE aPTT IS ELEVATED, SUGGEST PATIENT RISK ASSESSMENT BE USED TO DETERMINE APPROPRIATE ANTICOAGULANT THERAPY.   Osmolality     Status: Abnormal   Collection Time: 09/12/14  8:25 PM  Result Value Ref Range   Osmolality 328 (H) 275 - 300 mOsm/kg    Comment: Performed at [facility]  Glucose, capillary     Status: Abnormal   Collection Time: 09/12/14 10:06 PM  Result Value Ref Range   Glucose-Capillary 174 (H) 65 - 99 mg/dL  Glucose, capillary     Status: Abnormal   Collection Time: 09/13/14  5:45 AM  Result Value Ref Range   Glucose-Capillary 149 (H) 65 - 99 mg/dL  Creatinine, urine, random     Status: None   Collection Time: 09/13/14  6:47 AM  Result Value Ref Range   Creatinine, Urine 137.40 mg/dL  Sodium, urine, random     Status: None   Collection Time: 09/13/14  6:47 AM  Result Value Ref Range   Sodium, Ur <10 mmol/L    Comment: REPEATED TO VERIFY  Osmolality, urine     Status: Abnormal   Collection Time: 09/13/14  6:47 AM  Result Value Ref Range   Osmolality, Ur 354 (L) 390 - 1090 mOsm/kg    Comment: Performed at [facility]  Urinalysis, Routine w reflex microscopic (not at [facility])     Status: Abnormal   Collection Time: 09/13/14  6:47 AM  Result Value Ref Range   Color, Urine YELLOW YELLOW   APPearance TURBID (A) CLEAR   Specific Gravity, Urine 1.026 1.005 - 1.030   pH 5.0 5.0 - 8.0   Glucose, UA NEGATIVE NEGATIVE mg/dL   Hgb urine dipstick NEGATIVE NEGATIVE   Bilirubin Urine SMALL (A) NEGATIVE   Ketones, ur NEGATIVE NEGATIVE mg/dL   Protein, ur 30 (A) NEGATIVE mg/dL   Urobilinogen, UA  0.2 0.0 - 1.0 mg/dL   Nitrite NEGATIVE NEGATIVE   Leukocytes, UA TRACE (A) NEGATIVE  Urine microscopic-add on     Status: Abnormal   Collection Time: 09/13/14  6:47 AM  Result Value Ref Range   Squamous Epithelial / LPF MANY (A) RARE   WBC, UA 0-2 <3 WBC/hpf   RBC / HPF 0-2 <3 RBC/hpf   Bacteria, UA MANY (A) RARE   Casts HYALINE CASTS (A) NEGATIVE   Crystals CA OXALATE CRYSTALS (A) NEGATIVE   Urine-Other FEW YEAST     Comment: AMORPHOUS URATES/PHOSPHATES  CBC     Status: Abnormal   Collection Time: 09/13/14  9:36 AM  Result Value Ref Range   WBC 10.4 4.0 - 10.5 K/uL   RBC 3.53 (L) 3.87 - 5.11 MIL/uL   Hemoglobin 9.7 (L) 12.0 - 15.0 g/dL   HCT 32.8 (L) 36.0 - 46.0 %   MCV 92.9 78.0 - 100.0 fL   MCH 27.5 26.0 - 34.0 pg   MCHC 29.6 (L) 30.0 - 36.0 g/dL   RDW 16.4 (H) 11.5 - 15.5 %   Platelets 176 150 - 400 K/uL  Basic metabolic panel     Status: Abnormal   Collection Time: 09/13/14  9:36 AM  Result Value Ref Range   Sodium 137 135 - 145 mmol/L   Potassium 4.7 3.5 - 5.1 mmol/L   Chloride 102 101 - 111 mmol/L   CO2 23 22 - 32 mmol/L   Glucose, Bld 173 (H) 65 - 99 mg/dL   BUN 88 (H) 6 - 20 mg/dL  Creatinine, Ser 3.00 (H) 0.44 - 1.00 mg/dL   Calcium 8.2 (L) 8.9 - 10.3 mg/dL   GFR calc non Af Amer 14 (L) >60 mL/min   GFR calc Af Amer 16 (L) >60 mL/min    Comment: (NOTE) The eGFR has been calculated using the CKD EPI equation. This calculation has not been validated in all clinical situations. eGFR's persistently <60 mL/min signify possible Chronic Kidney Disease.    Anion gap 12 5 - 15  Heparin level (unfractionated)     Status: None   Collection Time: 09/13/14  9:36 AM  Result Value Ref Range   Heparin Unfractionated 0.45 0.30 - 0.70 IU/mL    Comment:        IF HEPARIN RESULTS ARE BELOW EXPECTED VALUES, AND PATIENT DOSAGE HAS BEEN CONFIRMED, SUGGEST FOLLOW UP TESTING OF ANTITHROMBIN III LEVELS.   APTT     Status: Abnormal   Collection Time: 09/13/14  9:36 AM   Result Value Ref Range   aPTT 69 (H) 24 - 37 seconds    Comment:        IF BASELINE aPTT IS ELEVATED, SUGGEST PATIENT RISK ASSESSMENT BE USED TO DETERMINE APPROPRIATE ANTICOAGULANT THERAPY.   Glucose, capillary     Status: Abnormal   Collection Time: 09/13/14 11:36 AM  Result Value Ref Range   Glucose-Capillary 153 (H) 65 - 99 mg/dL    US Renal  09/12/2014   CLINICAL DATA:  Acute renal failure. History of hypertension and diabetes. Subsequent encounter.  EXAM: RENAL / URINARY TRACT ULTRASOUND COMPLETE  COMPARISON:  Renal ultrasound 08/21/2014.  FINDINGS: Right Kidney:  Length: 10.7 cm. Echogenicity within normal limits. No mass or hydronephrosis visualized.  Left Kidney:  Length: 11.0 cm. The left kidney is partly obscured by bowel gas and suboptimally visualized. No evidence of hydronephrosis or renal mass.  Bladder:  Decompressed and not visualized. A small amount of pelvic ascites noted.  Of note, examination limited by body habitus.  IMPRESSION: Stable renal ultrasound demonstrating no hydronephrosis. The left kidney is not well visualized. Pelvic ascites noted.   Electronically Signed   By: Richardean Sale M.D.   On: 09/12/2014 13:12   Dg Chest Port 1 View  09/13/2014   CLINICAL DATA:  Congestive heart failure, shortness of breath.  EXAM: PORTABLE CHEST - 1 VIEW  COMPARISON:  09/11/2014 and CT chest 02/09/2014.  FINDINGS: Trachea is midline. Heart size stable. Lungs are somewhat low in volume with probable bibasilar atelectasis. No airspace consolidation or pleural fluid.  IMPRESSION: Low lung volumes with probable bibasilar atelectasis.   Electronically Signed   By: Lorin Picket M.D.   On: 09/13/2014 09:47     Blood pressure 110/40, pulse 75, temperature 97.5 F (36.4 C), temperature source Oral, resp. rate 18, height _0  (1.6 m), weight 242 lb 8.1 oz (110 kg), SpO2 96 %.  General: Obese elderly Caucasian female, restless, 2 L O2 by nasal cannula HEENT: Bruising overlying right  eye, obese neck Cardiac: Irregular rate, no rubs, murmurs or gallops Pulm: clear to auscultation bilaterally in anterior lung fields Abd: Obese abdomen, soft, nontender, nondistended, BS present Ext: Cold to touch in all 4 extremities, 2+ pitting edema right greater than left Neuro: Alert and oriented to name, place, president, year, responds to questions appropriately; moving all extremities freely though appears restless   Assessment/Plan: #1 oliguric Acute on chronic kidney disease: Likely prerenal given no urine sodium. Potential causes include hypoperfusion in the setting of continued ARB use +/- arrhythmia though  she has been rate controlled. No history of peripheral vascular disease, and cardiac catheterization in 2015 notable for clean coronaries. After extensive discussion with family and patient and reviewing her medical course, she expresses frustration with her current medical issues and understands that additional treatments/interventions would not align with her priorities [spending time with family]. She is amenable to a palliative approach. -Hold ARB and diuretics -Consult palliative care to further define goals of care  #2 R eye subconjunctival hemorrhage  #3 paroxysmal atrial fibrillation: Currently rate controlled. Per cardiology.  #4 chronic diastolic failure  #5 type 2 diabetes complicated by neuropathy  #6 morbid obesity  #7 COPD  Disposition: Pending palliative care recommendations.    Charlott Rakes 09/13/2014, 2:35 PM

## 2014-09-13 NOTE — Progress Notes (Signed)
At beginning of shift during patient very lethargic and difficult to arouse. After about 5-10 min patient begin to be more alert and open her eyes. It took a while for the patient to really wake up. While arousing patient, at first she was slow to respond but as she began to be more awake, patient begin to respond quickly with eye contact. Patient was able to take one pill but noticed a little difficulty, so for the pain medication, nurse crushed medication and put with applesauce. Patient was able to tell nurse that she liked applesauce when asked and did very well with swallowing medication crushed with the applesauce. Patient was able to tell nurse she was very cold and that she stays cold. Suggested a warm blanket and patient stated she would really like that. Nurse very amazed at patient's response after waking up for in the beginning she was very difficult to wake up. Patient was pulled up in the bed with help of another staff member and repositioned in bed. Will continue to monitor patient to end of shift and continue to provide comfort care.

## 2014-09-13 NOTE — Telephone Encounter (Signed)
[name] [name] [name] called on behalf of patient, [name] [name] was admitted to the hospital and is wondering if you have had a chance to see her...Marland Kitchenapparently she had a fall. She is asking if you would call her. [facility]   [phone])

## 2014-09-13 NOTE — Progress Notes (Signed)
ANTICOAGULATION CONSULT NOTE - Follow Up Consult  Pharmacy Consult for heparin Indication: atrial fibrillation  Allergies  Allergen Reactions  . Codeine Nausea And Vomiting  . Demerol Other (See Comments)    Drunk feeling"  . Doxycycline Nausea And Vomiting  . Statins Other (See Comments)    "hurt"   . Zetia [Ezetimibe] Other (See Comments)    hurt    Patient Measurements: Height: 5\' 3"  (160 cm) Weight: 242 lb 8.1 oz (110 kg) IBW/kg (Calculated) : 52.4 Heparin Dosing Weight: 81 kg  Vital Signs: Temp: 97.5 F (36.4 C) (08/31 1354) Temp Source: Oral (08/31 1354) BP: 110/40 mmHg (08/31 1354) Pulse Rate: 75 (08/31 1354)  Labs:  Recent Labs  09/11/14 0825  09/11/14 1501 09/11/14 1909  09/12/14 0125 09/12/14 0756 09/12/14 1421 09/12/14 2025 09/13/14 0936  HGB 9.6*  --   --   --   --  9.7*  --   --   --  9.7*  HCT 32.0*  --   --   --   --  32.8*  --   --   --  32.8*  PLT 296  --   --   --   --  302  --   --   --  176  APTT  --   --   --   --   < > 57* 120*  --  159* 69*  HEPARINUNFRC  --   --   --   --   --  >2.20*  --   --   --  0.45  CREATININE 1.85*  < > 1.91* 2.01*  --  2.00*  --  2.42*  --  3.00*  TROPONINI  --   --  0.03 0.03  --  0.03  --   --   --   --   < > = values in this interval not displayed.  Estimated Creatinine Clearance: 17.2 mL/min (by C-G formula based on Cr of 3).   Medications:  Scheduled:  . amiodarone  150 mg Intravenous Once  . bisoprolol  7.5 mg Oral Daily  . budesonide-formoterol  2 puff Inhalation BID  . calcium citrate  200 mg of elemental calcium Oral Daily  . Chlorhexidine Gluconate Cloth  6 each Topical Q0600  . cyanocobalamin  1,000 mcg Intramuscular Weekly  . erythromycin   Right Eye TID  . famotidine  20 mg Oral Daily  . feeding supplement (ENSURE ENLIVE)  237 mL Oral BID BM  . folic acid  1 mg Oral Daily  . gabapentin  300 mg Oral BID  . insulin aspart  0-9 Units Subcutaneous TID WC  . insulin glargine  10 Units  Subcutaneous QHS  . mupirocin ointment  1 application Nasal BID  . nystatin cream   Topical BID  . pantoprazole  40 mg Oral q morning - 10a  . sodium chloride  3 mL Intravenous Q12H   Infusions:  . amiodarone 30 mg/hr (09/13/14 1201)  . heparin 850 Units/hr (09/13/14 1201)    Assessment: 81 yo female with afib is currently on supratherapeutic heparin.  Last dose of xarelto was 09/10/14. Aptt and HL appear to be correlating, aptt is 69, barely therapeutic.   Goal of Therapy:  Heparin level 0.3-0.7 units/ml; aPTT 66-102 seconds Monitor platelets by anticoagulation protocol: Yes   Plan:  -Increase heparin slightly to 850 units/hr -Daily HL, CBC -Check confirmatory HL this evening  -Monitor s/sx bleeding   Harvel Quale 09/13/2014,2:33 PM

## 2014-09-13 NOTE — Progress Notes (Signed)
CRITICAL VALUE ALERT  Critical value received:  Lactic Acid - 2.6  Date of notification:  09/13/2014  Time of notification:  2130  Critical value read back:Yes.    Nurse who received alert:  Jannifer Rodney  MD notified (1st page):  Kirby,NP -  hospitalist via text page  Time of first page:  2140  MD notified (2nd page): n/a  Time of second page: n/a  Responding MD:  Baltazar Najjar, NP  Time MD responded:  2150

## 2014-09-13 NOTE — Progress Notes (Signed)
Physical Therapy Treatment Patient Details Name: [name] [name] MRN: [id number] DOB: [birth year] Today's Date: 09/13/2014    History of Present Illness 81 y/o female who was recently admitted to the hospital due to UTI and was found to have A. Fib with RVR requiring cardioversion. She was discharged to SNF for rehabilitation and returned home on Saturday 09/09/14. Patient was doing ok according to her; but on early hours on day of admission she roll out of bed and hurt her right side of her face and back. She presented with right scleral laceration and subconjunctival hemorrhage. Patient was also found with acute on chronic renal failure and back on A. Fib with RVR.    PT Comments    Pt very lethargic today which prevented her from progressing with mobility, transferred bed to chair with RW but needed +2 max A which was more than she required last session. Will hopefully be more alert next session. PT will continue to follow.   Follow Up Recommendations  SNF;Supervision/Assistance - 24 hour     Equipment Recommendations  None recommended by PT    Recommendations for Other Services       Precautions / Restrictions Precautions Precautions: Fall Precaution Comments: monitor HR during session Restrictions Weight Bearing Restrictions: No    Mobility  Bed Mobility Overal bed mobility: Needs Assistance;+2 for physical assistance Bed Mobility: Sidelying to Sit;Rolling Rolling: Mod assist Sidelying to sit: Max assist;+2 for physical assistance       General bed mobility comments: rolled left with mod A, max A to slide legs off bed and max A +2 for SL to sit, partially due to increased back pain for pt with transfer but also pt lethargy.   Transfers Overall transfer level: Needs assistance Equipment used: Rolling walker (2 wheeled) Transfers: Sit to/from [facility] Sit to Stand: +2 physical assistance;Max assist Stand pivot transfers: +2 physical assistance;Max  assist       General transfer comment: cued pt several times to initiate sit to stand and she would say "ok" but then not do so, again lethargy affecting mobility. Pt cued multiple times to place at  least one hand on the bed instead of the RW but pt continually moved hand back to RW so RW supported and pt pushed on that. Feet and kneed blocked and max A +2 given for power up. Small, shuffling steps to pivot to chair and descent to chair entirely controlled by therapist   Ambulation/Gait             General Gait Details: unable to perform safely   Stairs            Wheelchair Mobility    Modified Rankin (Stroke Patients Only)       Balance Overall balance assessment: Needs assistance Sitting-balance support: Bilateral upper extremity supported Sitting balance-Leahy Scale: Poor Sitting balance - Comments: posterior lean, min A to maintain sitting EOB Postural control: Posterior lean Standing balance support: Bilateral upper extremity supported Standing balance-Leahy Scale: Poor Standing balance comment: Pt required +2 mod A to maintain standing with UE support, LOB to left with inabiltiy to correct                    Cognition Arousal/Alertness: Lethargic Behavior During Therapy: Flat affect Overall Cognitive Status: Impaired/Different from baseline Area of Impairment: Following commands;Safety/judgement;Attention;Problem solving Orientation Level: Disoriented to;Situation;Time Current Attention Level: Focused Memory: Decreased short-term memory Following Commands: Follows one step commands inconsistently;Follows one step commands with increased time  Safety/Judgement: Decreased awareness of safety;Decreased awareness of deficits Awareness: Intellectual Problem Solving: Slow processing;Decreased initiation;Requires verbal cues;Requires tactile cues General Comments: very hard to keep pt aroused, even when sitting EOB. Pt appropriate with conversation when  awake    Exercises General Exercises - Lower Extremity Ankle Circles/Pumps: AROM;Both;10 reps;Seated Long Arc Quad: AROM;Both;10 reps;Seated    General Comments        Pertinent Vitals/Pain Pain Assessment: Faces Faces Pain Scale: Hurts even more Pain Location: back with SL to sit Pain Descriptors / Indicators: Aching Pain Intervention(s): Limited activity within patient's tolerance;Monitored during session    Home Living                      Prior Function            PT Goals (current goals can now be found in the care plan section) Acute Rehab PT Goals Patient Stated Goal: wants to go home PT Goal Formulation: Patient unable to participate in goal setting Time For Goal Achievement: 09/26/14 Potential to Achieve Goals: Fair Progress towards PT goals: Not progressing toward goals - comment (lethargy)    Frequency  Min 3X/week    PT Plan Current plan remains appropriate    Co-evaluation             End of Session Equipment Utilized During Treatment: Gait belt;Oxygen Activity Tolerance: Patient limited by lethargy Patient left: in chair;with chair alarm set;with call bell/phone within reach;with family/visitor present     Time: 9292-4462 PT Time Calculation (min) (ACUTE ONLY): 23 min  Charges:  $Therapeutic Activity: 23-37 mins                    G Codes:     Leighton Roach, PT  Acute Rehab Services  [place], [place] 09/13/2014, 11:14 AM

## 2014-09-13 NOTE — Progress Notes (Signed)
ANTICOAGULATION CONSULT NOTE - Follow Up Consult  Pharmacy Consult for Heparin Indication: atrial fibrillation  Allergies  Allergen Reactions  . Codeine Nausea And Vomiting  . Demerol Other (See Comments)    Drunk feeling"  . Doxycycline Nausea And Vomiting  . Statins Other (See Comments)    "hurt"   . Zetia [Ezetimibe] Other (See Comments)    hurt    Patient Measurements: Height: 5\' 3"  (160 cm) Weight: 242 lb 8.1 oz (110 kg) IBW/kg (Calculated) : 52.4 Heparin Dosing Weight: 81 kg  Vital Signs: Temp: 97.5 F (36.4 C) (08/31 1354) Temp Source: Oral (08/31 1354) BP: 110/40 mmHg (08/31 1354) Pulse Rate: 75 (08/31 1354)  Labs:  Recent Labs  09/11/14 0825  09/11/14 1501 09/11/14 1909  09/12/14 0125 09/12/14 0756 09/12/14 1421 09/12/14 2025 09/13/14 0936 09/13/14 2021  HGB 9.6*  --   --   --   --  9.7*  --   --   --  9.7*  --   HCT 32.0*  --   --   --   --  32.8*  --   --   --  32.8*  --   PLT 296  --   --   --   --  302  --   --   --  176  --   APTT  --   --   --   --   < > 57* 120*  --  159* 69*  --   HEPARINUNFRC  --   --   --   --   --  >2.20*  --   --   --  0.45 0.41  CREATININE 1.85*  < > 1.91* 2.01*  --  2.00*  --  2.42*  --  3.00*  --   TROPONINI  --   --  0.03 0.03  --  0.03  --   --   --   --   --   < > = values in this interval not displayed.  Estimated Creatinine Clearance: 17.2 mL/min (by C-G formula based on Cr of 3).   Medications:  Scheduled:  . amiodarone  150 mg Intravenous Once  . bisoprolol  7.5 mg Oral Daily  . budesonide-formoterol  2 puff Inhalation BID  . calcium citrate  200 mg of elemental calcium Oral Daily  . Chlorhexidine Gluconate Cloth  6 each Topical Q0600  . cyanocobalamin  1,000 mcg Intramuscular Weekly  . erythromycin   Right Eye TID  . famotidine  20 mg Oral Daily  . feeding supplement (ENSURE ENLIVE)  237 mL Oral BID BM  . folic acid  1 mg Oral Daily  . gabapentin  300 mg Oral BID  . insulin aspart  0-9 Units  Subcutaneous TID WC  . insulin glargine  10 Units Subcutaneous QHS  . mupirocin ointment  1 application Nasal BID  . nystatin cream   Topical BID  . pantoprazole  40 mg Oral q morning - 10a  . sodium chloride  3 mL Intravenous Q12H   Infusions:  . amiodarone 30 mg/hr (09/13/14 1201)  . heparin 850 Units/hr (09/13/14 1201)    Assessment: 81 yo F continues on IV heparin for afib.  Heparin level is therapeutic on 850 units/hr.  Awaiting resolve of ARF for restart of Xarelto and/or decision on changing PO anticoag plans.  Goal of Therapy:  Heparin level 0.3-0.7 units/ml Monitor platelets by anticoagulation protocol: Yes   Plan:  Continue heparin at 850 units/hr  Daily heparin level and CBC  Sura Canul, Pharm.D., BCPS Clinical Pharmacist Pager [phone] 09/13/2014 9:23 PM

## 2014-09-13 NOTE — Progress Notes (Signed)
3 different phlebotomists have tried to get AM labs, but have been unsuccessful. AM labs rescheduled for 0800, when a new phlebotomist will be available.

## 2014-09-13 NOTE — Progress Notes (Signed)
RN and NT have tried 3 times to straight cath this patient. RN able to get only 50 mL urine out and send to lab for specimen. Pt may need foley if she continues to be intermittently unwilling to get onto bedside commode. Will pass on to day shift RN.

## 2014-09-14 ENCOUNTER — Ambulatory Visit: Payer: Medicare HMO | Admitting: Internal Medicine

## 2014-09-14 ENCOUNTER — Encounter (HOSPITAL_COMMUNITY): Payer: Medicare HMO

## 2014-09-14 ENCOUNTER — Telehealth: Payer: Self-pay | Admitting: Cardiology

## 2014-09-14 DIAGNOSIS — G8929 Other chronic pain: Secondary | ICD-10-CM

## 2014-09-14 DIAGNOSIS — Z515 Encounter for palliative care: Secondary | ICD-10-CM

## 2014-09-14 DIAGNOSIS — M545 Low back pain: Secondary | ICD-10-CM

## 2014-09-14 DIAGNOSIS — Z7189 Other specified counseling: Secondary | ICD-10-CM

## 2014-09-14 LAB — BASIC METABOLIC PANEL
Anion gap: 12 (ref 5–15)
BUN: 86 mg/dL — AB (ref 6–20)
CALCIUM: 7.8 mg/dL — AB (ref 8.9–10.3)
CO2: 23 mmol/L (ref 22–32)
Chloride: 100 mmol/L — ABNORMAL LOW (ref 101–111)
Creatinine, Ser: 3.48 mg/dL — ABNORMAL HIGH (ref 0.44–1.00)
GFR calc Af Amer: 13 mL/min — ABNORMAL LOW (ref >60)
GFR, EST NON AFRICAN AMERICAN: 11 mL/min — AB (ref >60)
GLUCOSE: 185 mg/dL — AB (ref 65–99)
Potassium: 4.6 mmol/L (ref 3.5–5.1)
Sodium: 135 mmol/L (ref 135–145)

## 2014-09-14 LAB — URINALYSIS, ROUTINE W REFLEX MICROSCOPIC
GLUCOSE, UA: 100 mg/dL — AB
KETONES UR: NEGATIVE mg/dL
Nitrite: NEGATIVE
PH: 5 (ref 5.0–8.0)
Protein, ur: >300 mg/dL — AB
SPECIFIC GRAVITY, URINE: 1.025 (ref 1.005–1.030)
Urobilinogen, UA: 0.2 mg/dL (ref 0.0–1.0)

## 2014-09-14 LAB — CBC
HCT: 30.3 % — ABNORMAL LOW (ref 36.0–46.0)
Hemoglobin: 8.9 g/dL — ABNORMAL LOW (ref 12.0–15.0)
MCH: 27.1 pg (ref 26.0–34.0)
MCHC: 29.4 g/dL — AB (ref 30.0–36.0)
MCV: 92.4 fL (ref 78.0–100.0)
PLATELETS: 209 10*3/uL (ref 150–400)
RBC: 3.28 MIL/uL — ABNORMAL LOW (ref 3.87–5.11)
RDW: 16.2 % — AB (ref 11.5–15.5)
WBC: 15.5 10*3/uL — ABNORMAL HIGH (ref 4.0–10.5)

## 2014-09-14 LAB — URINE CULTURE

## 2014-09-14 LAB — URINE MICROSCOPIC-ADD ON

## 2014-09-14 LAB — GLUCOSE, CAPILLARY
GLUCOSE-CAPILLARY: 158 mg/dL — AB (ref 65–99)
Glucose-Capillary: 170 mg/dL — ABNORMAL HIGH (ref 65–99)

## 2014-09-14 LAB — LACTIC ACID, PLASMA: Lactic Acid, Venous: 1.2 mmol/L (ref 0.5–2.0)

## 2014-09-14 LAB — HEPARIN LEVEL (UNFRACTIONATED): HEPARIN UNFRACTIONATED: 0.3 [IU]/mL (ref 0.30–0.70)

## 2014-09-14 MED ORDER — HYDROMORPHONE HCL 1 MG/ML IJ SOLN
0.5000 mg | INTRAMUSCULAR | Status: DC | PRN
  Administered 2014-09-15: 0.5 mg via INTRAVENOUS
  Filled 2014-09-14: qty 1

## 2014-09-14 MED ORDER — LORAZEPAM 2 MG/ML IJ SOLN
1.0000 mg | INTRAMUSCULAR | Status: DC | PRN
  Filled 2014-09-14: qty 1

## 2014-09-14 MED ORDER — GLYCOPYRROLATE 0.2 MG/ML IJ SOLN
0.2000 mg | INTRAMUSCULAR | Status: DC | PRN
  Filled 2014-09-14: qty 1

## 2014-09-14 MED ORDER — SODIUM CHLORIDE 0.9 % IV SOLN: 250.0000 mL | INTRAVENOUS | Status: DC | PRN

## 2014-09-14 MED ORDER — ONDANSETRON 4 MG PO TBDP
4.0000 mg | ORAL_TABLET | Freq: Four times a day (QID) | ORAL | Status: DC | PRN
Start: 2014-09-14 — End: 2014-09-15
  Filled 2014-09-14: qty 1

## 2014-09-14 MED ORDER — SODIUM CHLORIDE 0.9 % IJ SOLN
3.0000 mL | Freq: Two times a day (BID) | INTRAMUSCULAR | Status: DC
  Administered 2014-09-14: 3 mL via INTRAVENOUS

## 2014-09-14 MED ORDER — HYDROMORPHONE HCL 1 MG/ML IJ SOLN
0.5000 mg | Freq: Once | INTRAMUSCULAR | Status: AC
  Administered 2014-09-14: 0.5 mg via INTRAVENOUS
  Filled 2014-09-14: qty 1

## 2014-09-14 MED ORDER — LORAZEPAM 2 MG/ML PO CONC: 1.0000 mg | ORAL | Status: DC | PRN

## 2014-09-14 MED ORDER — LORAZEPAM 1 MG PO TABS: 1.0000 mg | ORAL_TABLET | ORAL | Status: DC | PRN

## 2014-09-14 MED ORDER — MORPHINE SULFATE (PF) 2 MG/ML IV SOLN
2.0000 mg | INTRAVENOUS | Status: DC | PRN
  Administered 2014-09-14: 2 mg via INTRAVENOUS
  Filled 2014-09-14 (×2): qty 1

## 2014-09-14 MED ORDER — ACETAMINOPHEN 650 MG RE SUPP: 650.0000 mg | RECTAL | Status: DC | PRN

## 2014-09-14 MED ORDER — ONDANSETRON HCL 4 MG/2ML IJ SOLN: 4.0000 mg | Freq: Four times a day (QID) | INTRAMUSCULAR | Status: DC | PRN

## 2014-09-14 MED ORDER — HALOPERIDOL LACTATE 2 MG/ML PO CONC
0.5000 mg | ORAL | Status: DC | PRN
  Filled 2014-09-14 (×3): qty 0.3

## 2014-09-14 MED ORDER — AMIODARONE HCL 200 MG PO TABS
200.0000 mg | ORAL_TABLET | Freq: Two times a day (BID) | ORAL | Status: DC
  Filled 2014-09-14: qty 1

## 2014-09-14 MED ORDER — LORAZEPAM 2 MG/ML IJ SOLN: 1.0000 mg | INTRAMUSCULAR | Status: DC | PRN

## 2014-09-14 MED ORDER — HALOPERIDOL 1 MG PO TABS
0.5000 mg | ORAL_TABLET | ORAL | Status: DC | PRN
  Filled 2014-09-14: qty 1

## 2014-09-14 MED ORDER — SODIUM CHLORIDE 0.9 % IV SOLN: 0.5000 mg/h | Freq: Once | INTRAVENOUS | Status: DC

## 2014-09-14 MED ORDER — HALOPERIDOL LACTATE 5 MG/ML IJ SOLN: 0.5000 mg | INTRAMUSCULAR | Status: DC | PRN

## 2014-09-14 MED ORDER — SODIUM CHLORIDE 0.9 % IJ SOLN: 3.0000 mL | INTRAMUSCULAR | Status: DC | PRN

## 2014-09-14 MED ORDER — GLYCOPYRROLATE 1 MG PO TABS
1.0000 mg | ORAL_TABLET | ORAL | Status: DC | PRN
  Filled 2014-09-14: qty 1

## 2014-09-14 NOTE — Progress Notes (Signed)
Received patient from [facility].  Patient is unresponsive and on comfort care.  Family members at bedside and endorsed some medications from [facility] to this nurse.  Informed this nurse that patient's nieces will be arriving tomorrow from [place] and they will be back tomorrow.  Patient resting on bed with no signs of distress.

## 2014-09-14 NOTE — Telephone Encounter (Signed)
New Message        Pt's son calling to notify Dr. Mare Ferrari that pt is going into hospice care.

## 2014-09-14 NOTE — Clinical Social Work Placement (Signed)
   CLINICAL SOCIAL WORK PLACEMENT  NOTE  Date:  09/14/2014  Patient Details  Name: CAMMI CONSALVO MRN: [id number] Date of Birth: 05-05-1930  Clinical Social Work is seeking post-discharge placement for this patient at the [place] level of care (*CSW will initial, date and re-position this form in  chart as items are completed):  Yes   Patient/family provided with [place] Work Department's list of facilities offering this level of care within the geographic area requested by the patient (or if unable, by the patient's family).  Yes   Patient/family informed of their freedom to choose among providers that offer the needed level of care, that participate in Medicare, Medicaid or managed care program needed by the patient, have an available bed and are willing to accept the patient.  Yes   Patient/family informed of [facility]'s ownership interest in [facility] and [facility], as well as of the fact that they are under no obligation to receive care at these facilities.  PASRR submitted to EDS on       PASRR number received on       Existing PASRR number confirmed on 09/13/14     FL2 transmitted to all facilities in geographic area requested by pt/family on 09/13/14     FL2 transmitted to all facilities within larger geographic area on       Patient informed that his/her managed care company has contracts with or will negotiate with certain facilities, including the following:   [facility])         Patient/family informed of bed offers received.  Patient chooses bed at       Physician recommends and patient chooses bed at      Patient to be transferred to   on  .  Patient to be transferred to facility by Ambulance  [facility])     Patient family notified on   of transfer.  Name of family member notified:        PHYSICIAN Please prepare priority discharge summary, including medications, Please sign FL2, Please prepare  prescriptions     Additional Comment:    _______________________________________________ Williemae Area, LCSW 09/13/14  3:33 PM

## 2014-09-14 NOTE — Telephone Encounter (Signed)
noted 

## 2014-09-14 NOTE — Progress Notes (Signed)
ANTICOAGULATION CONSULT NOTE - Follow Up Consult  Pharmacy Consult for heparin Indication: atrial fibrillation  Allergies  Allergen Reactions  . Codeine Nausea And Vomiting  . Demerol Other (See Comments)    Drunk feeling"  . Doxycycline Nausea And Vomiting  . Statins Other (See Comments)    "hurt"   . Zetia [Ezetimibe] Other (See Comments)    hurt    Patient Measurements: Height: 5\' 3"  (160 cm) Weight: 249 lb 12.5 oz (113.3 kg) (bedscale) IBW/kg (Calculated) : 52.4 Heparin Dosing Weight: 81 kg  Vital Signs: Temp: 98 F (36.7 C) (09/01 0736) Temp Source: Oral (09/01 0736) BP: 92/45 mmHg (09/01 0736) Pulse Rate: 69 (09/01 0736)  Labs:  Recent Labs  09/11/14 1501 09/11/14 1909  09/12/14 0125 09/12/14 0756 09/12/14 1421 09/12/14 2025 09/13/14 0936 09/13/14 2021 09/14/14 0600  HGB  --   --   < > 9.7*  --   --   --  9.7*  --  8.9*  HCT  --   --   --  32.8*  --   --   --  32.8*  --  30.3*  PLT  --   --   --  302  --   --   --  176  --  209  APTT  --   --   < > 57* 120*  --  159* 69*  --   --   HEPARINUNFRC  --   --   < > >2.20*  --   --   --  0.45 0.41 0.30  CREATININE 1.91* 2.01*  --  2.00*  --  2.42*  --  3.00*  --  3.48*  TROPONINI 0.03 0.03  --  0.03  --   --   --   --   --   --   < > = values in this interval not displayed.  Estimated Creatinine Clearance: 15.1 mL/min (by C-G formula based on Cr of 3.48).   Medications:  Scheduled:  . amiodarone  150 mg Intravenous Once  . bisoprolol  7.5 mg Oral Daily  . budesonide-formoterol  2 puff Inhalation BID  . calcium citrate  200 mg of elemental calcium Oral Daily  . Chlorhexidine Gluconate Cloth  6 each Topical Q0600  . cyanocobalamin  1,000 mcg Intramuscular Weekly  . erythromycin   Right Eye TID  . famotidine  20 mg Oral Daily  . feeding supplement (ENSURE ENLIVE)  237 mL Oral BID BM  . folic acid  1 mg Oral Daily  . gabapentin  300 mg Oral BID  . insulin aspart  0-9 Units Subcutaneous TID WC  . insulin  glargine  10 Units Subcutaneous QHS  . mupirocin ointment  1 application Nasal BID  . nystatin cream   Topical BID  . pantoprazole  40 mg Oral q morning - 10a  . sodium chloride  3 mL Intravenous Q12H   Infusions:  . amiodarone 30 mg/hr (09/14/14 0018)  . heparin 850 Units/hr (09/14/14 7564)    Assessment: 81 yo female with afib, currently on heparin.  Pt admitted after a fall with subconjunctival hematoma.  Pt was on Xarelto PTA, last dose was 09/10/14. Pt is now having AKI, with SCr worsening, up to 2.48, eCrCl 15-20 ml/min. If renal fx does not improve she may have to transition to warfarin.  She has not converted out of AFib, cardiology is considering a cardioversion later in the week.  HL is on low end this morning, small  drop in hgb, plts stable.   Goal of Therapy:  Heparin level 0.3-0.7 units/ml Monitor platelets by anticoagulation protocol: Yes   Plan:  -Increase heparin slightly to 900 units/hr -Daily HL, CBC -Monitor s/sx bleeding    Hughes Better, PharmD, BCPS Clinical Pharmacist Pager: [phone] 09/14/2014 8:44 AM

## 2014-09-14 NOTE — Telephone Encounter (Signed)
Information given to  Dr. Mare Ferrari

## 2014-09-14 NOTE — Consult Note (Signed)
Consultation Note Date: 09/14/2014   Patient Name: [name] [name]  DOB: [birth year]  MRN: [id number]  Age / Sex: 81 y.o., female   PCP: Cari Caraway, MD Referring Physician: Orson Eva, MD  Reason for Consultation: Disposition and Terminal care  Palliative Care Assessment and Plan Summary of Established Goals of Care and Medical Treatment Preferences   Clinical Assessment/Narrative: Ms. Paradiso is an 81 year old female with multiple chronic medical problems including diabetes, peripheral neuropathy, morbid obesity, recent hospitalization and cardioversion for atrial fibrillation with RVR, COPD, chronic back pain who was admitted after rolling out of bed and sustaining a trauma to her right eye. She was found to be in atrial fibrillation with RVR.She is evaluated by cardiology, and placed on heparin and amiodarone drip.Her hospital course was then further complicated by development of oliguric acute on chronic renal failure. Nephrology was consulted and had long discussion regarding goals with patient and family. Further discussion revealed the family would like to pursue placement with hospice agency for focus on comfort care. Palliative was consulted to facilitate discussion on goals of care as well as to provide a review of options for hospice services.  I met with Ms. Cheek, her husband, her daughter, her son, her sister and other family members and friends. They are in agreement that based upon her current clinical situation, her wishes would be to continue to focus on her comfort. They report that she was less confused yesterday and explicitly stated this.  We discussed options to allow her to maximize time with her family, focus on her comfort, and hopefully transition out of the hospital. These options included going home with hospice support versus residential hospice facility. The family is in agreement that they would like to pursue placement at [facility] as they feel this is where  she would be best served. In the meantime, they are in agreement that her care should be explicitly focused on her comfort.  - Orders updated and initiated palliative end-of-life order set. - Discussed case with Butch Penny, Education officer, museum, and she will place referral to [facility]  - Discussed with Dr. Carles Collet, primary hospitalist  Contacts/Participants in Discussion: Primary Decision Maker: Patient, her daughter children, and her husband   HCPOA: None on chart   Code Status/Advance Care Planning:  Patient is DO NOT RESUSCITATE.  Pursuing comfort care  We'll request evaluation for placement at [facility]  Symptom Management:   Pain: Patient with uncontrolled pain on initial encounter. She was given a trial dose of IV Dilaudid 0.5 mg with good effect. We'll plan to continue same on when necessary basis overnight. Would titrate up dose and frequency as needed to maintain comfort.  Anxiety: Ativan as needed  Nausea: Zofran or Haldol as needed  Excess secretions: Robinul as needed   Additional Recommendations (Limitations, Scope, Preferences):  Patient has been clear that she would like focus of her care to be on her comfort.   Psycho-social/Spiritual:   Support System: Friends and family  Desire for further Chaplaincy support:no  Prognosis: < 2 weeks based upon her oliguric acute on chronic renal failure. She has had an acute change in her condition from yesterday per family report as they state she seems somewhat confused and is also more sleepy today.  Discharge Planning:  Hospice facility       Chief Complaint/History of Present Illness:  81 year old female with multiple chronic medical conditions with development of oliguric acute on chronic renal failure. Patient has expressed desire for comfort care.  Primary Diagnoses  Present on Admission:  . AKI (acute kidney injury) . Severe obesity (BMI >= 40) . Chronic low back pain . Atrial fibrillation with RVR . COPD  GOLD II  . Subconjunctival hemorrhage of right eye . Benign hypertensive heart disease without heart failure . Pure hypercholesterolemia . PAC (premature atrial contraction) . Acute renal failure superimposed on stage 4 chronic kidney disease . Fall  Palliative Review of Systems: Patient reports pain in her back. Otherwise review of systems negative I have reviewed the medical record, interviewed the patient and family, and examined the patient. The following aspects are pertinent.  Past Medical History  Diagnosis Date  . Mild aortic stenosis     a. 10/2010 Echo: Ef 60-65%, no rwma, mild LVH no AS;  b. 08/2014 Echo: mild to mod AS.  Marland Kitchen Chronic back pain   . Peripheral neuropathy   . GERD (gastroesophageal reflux disease)   . Essential hypertension   . History of pneumonia   . History of bronchitis   . Basal cell carcinoma     a. 2013.  . Non-cardiac chest pain     a. 12/2013 LHC w/ nl coronaries and nl EF 60%.  . Frequent PAC's   . Heart murmur   . Type II diabetes mellitus   . Atrial fibrillation with RVR     a. 08/2014 s/p TEE/DCCV;  b. CHA2DS2VASc = 5-->Xarelto.  . Chronic diastolic CHF (congestive heart failure)     a. 08/2014 Echo: EF 55-60%, mild to mod AS, mild MR, mod dil LA/RA, mildly dil RV, PASP .  . Morbid obesity    Social History   Social History  . Marital Status: Married    Spouse Name: N/A  . Number of Children: N/A  . Years of Education: N/A   Occupational History  . Retired    Social History Main Topics  . Smoking status: Former Smoker -- 0.50 packs/day for 5 years    Types: Cigarettes    Quit date: 01/14/1980  . Smokeless tobacco: Never Used  . Alcohol Use: No  . Drug Use: No  . Sexual Activity: Not Asked   Other Topics Concern  . None   Social History Narrative   Lives with Husband in [place] but is currently @ SNF following admission in early August.   Has a dtr in [place], [place] and a son in [place], [place].   Family History    Problem Relation Age of Onset  . Other Mother     died @ 84, complications following childbirth  . COPD Father     died @ 72  . Heart failure Father   . Irregular heart beat Sister     1 sister with PPM.   Scheduled Meds: . erythromycin   Right Eye TID  . mupirocin ointment  1 application Nasal BID  . nystatin cream   Topical BID  . sodium chloride  3 mL Intravenous Q12H  . sodium chloride  3 mL Intravenous Q12H   Continuous Infusions:  PRN Meds:.sodium chloride, albuterol, glycopyrrolate **OR** glycopyrrolate **OR** glycopyrrolate, haloperidol **OR** haloperidol **OR** haloperidol lactate, HYDROmorphone (DILAUDID) injection, ipratropium-albuterol, LORazepam **OR** [DISCONTINUED] LORazepam **OR** LORazepam, ondansetron **OR** ondansetron (ZOFRAN) IV, sodium chloride Medications Prior to Admission:  Prior to Admission medications   Medication Sig Start Date End Date Taking? Authorizing Provider  acetaminophen (TYLENOL) 325 MG tablet Take 2 tablets (650 mg total) by mouth every 6 (six) hours as needed for mild pain (or Fever >/= 101). 08/24/14  Yes Albertine Patricia, MD  albuterol (PROVENTIL HFA;VENTOLIN HFA) 108 (90 BASE) MCG/ACT inhaler Inhale 1 puff into the lungs every 6 (six) hours as needed for wheezing or shortness of breath.   Yes Historical Provider, MD  BIOTIN PO Take 1 tablet by mouth daily. 1,000 mg daily   Yes Historical Provider, MD  bisoprolol (ZEBETA) 5 MG tablet Take 1.5 tablets (7.5 mg total) by mouth daily. 08/24/14  Yes Deno Etienne, DO  budesonide-formoterol (SYMBICORT) 80-4.5 MCG/ACT inhaler Inhale 2 puffs into the lungs 2 (two) times daily. 04/12/14  Yes Tanda Rockers, MD  calcium citrate (CALCITRATE - DOSED IN MG ELEMENTAL CALCIUM) 950 MG tablet Take 200 mg of elemental calcium by mouth daily.   Yes Historical Provider, MD  cholecalciferol (VITAMIN D) 1000 UNITS tablet Take 1,000 Units by mouth daily.   Yes Historical Provider, MD  cyanocobalamin (,VITAMIN B-12,) 1000  MCG/ML injection Inject 1 mL (1,000 mcg total) into the muscle once a week. 08/24/14  Yes Albertine Patricia, MD  famotidine (PEPCID) 20 MG tablet Take 20 mg by mouth daily.   Yes Historical Provider, MD  feeding supplement, ENSURE ENLIVE, (ENSURE ENLIVE) LIQD Take 237 mLs by mouth 2 (two) times daily between meals. 08/24/14  Yes Albertine Patricia, MD  folic acid (FOLVITE) 1 MG tablet Take 1 tablet (1 mg total) by mouth daily. 08/24/14  Yes Albertine Patricia, MD  furosemide (LASIX) 40 MG tablet Take 40 mg by mouth daily.   Yes Historical Provider, MD  gabapentin (NEURONTIN) 300 MG capsule Take 2 capsules (600 mg total) by mouth 2 (two) times daily. 02/05/14  Yes Orson Eva, MD  glimepiride (AMARYL) 2 MG tablet Take 2 mg by mouth daily with breakfast.   Yes Historical Provider, MD  hydrOXYzine (ATARAX/VISTARIL) 10 MG tablet Take 10 mg by mouth every 12 (twelve) hours as needed.   Yes Historical Provider, MD  ipratropium-albuterol (DUONEB) 0.5-2.5 (3) MG/3ML SOLN Take 3 mLs by nebulization every 6 (six) hours as needed.    Yes Historical Provider, MD  losartan (COZAAR) 50 MG tablet Take 25 mg by mouth daily.  03/23/14  Yes Historical Provider, MD  Melatonin 3 MG TABS Take 3 mg by mouth at bedtime.   Yes Historical Provider, MD  metFORMIN (GLUCOPHAGE) 1000 MG tablet Take 1 tablet (1,000 mg total) by mouth 2 (two) times daily with a meal. 01/06/14  Yes Brittainy Erie Noe, PA-C  nitroGLYCERIN (NITROSTAT) 0.4 MG SL tablet Place 1 tablet (0.4 mg total) under the tongue every 5 (five) minutes as needed for chest pain. 01/02/14  Yes Darlin Coco, MD  pantoprazole (PROTONIX) 40 MG tablet Take 40 mg by mouth every morning.    Yes Historical Provider, MD  rivaroxaban (XARELTO) 20 MG TABS tablet Take 1 tablet (20 mg total) by mouth daily with supper. 08/24/14  Yes Silver Huguenin Elgergawy, MD  rosuvastatin (CRESTOR) 5 MG tablet Take 5 mg by mouth at bedtime.   Yes Historical Provider, MD  Spacer/Aero-Holding Chambers  (AEROCHAMBER MV) inhaler Use as instructed 03/14/14  Yes Tanda Rockers, MD   Allergies  Allergen Reactions  . Codeine Nausea And Vomiting  . Demerol Other (See Comments)    Drunk feeling"  . Doxycycline Nausea And Vomiting  . Statins Other (See Comments)    "hurt"   . Zetia [Ezetimibe] Other (See Comments)    hurt   CBC:    Component Value Date/Time   WBC 15.5* 09/14/2014 0600   HGB 8.9* 09/14/2014  0600   HCT 30.3* 09/14/2014 0600   PLT 209 09/14/2014 0600   MCV 92.4 09/14/2014 0600   NEUTROABS 10.2* 09/11/2014 0825   LYMPHSABS 1.5 09/11/2014 0825   MONOABS 1.0 09/11/2014 0825   EOSABS 0.2 09/11/2014 0825   BASOSABS 0.0 09/11/2014 0825   Comprehensive Metabolic Panel:    Component Value Date/Time   NA 135 09/14/2014 0600   K 4.6 09/14/2014 0600   CL 100* 09/14/2014 0600   CO2 23 09/14/2014 0600   BUN 86* 09/14/2014 0600   CREATININE 3.48* 09/14/2014 0600   GLUCOSE 185* 09/14/2014 0600   CALCIUM 7.8* 09/14/2014 0600   AST 23 08/24/2014 1910   ALT 13* 08/24/2014 1910   ALKPHOS 84 08/24/2014 1910   BILITOT 0.4 08/24/2014 1910   PROT 7.1 08/24/2014 1910   ALBUMIN 3.7 08/24/2014 1910    Physical Exam: Vital Signs: BP 94/41 mmHg  Pulse 111  Temp(Src) 98 F (36.7 C) (Oral)  Resp 24  Ht $R'5\' 3"'yP$  (1.6 m)  Wt 113.3 kg (249 lb 12.5 oz)  BMI 44.26 kg/m2  SpO2 93% SpO2: SpO2: 93 % O2 Device: O2 Device: Nasal Cannula O2 Flow Rate: O2 Flow Rate (L/min): 2.5 L/min Intake/output summary:  Intake/Output Summary (Last 24 hours) at 09/14/14 1623 Last data filed at 09/14/14 1000  Gross per 24 hour  Intake 1720.35 ml  Output     40 ml  Net 1680.35 ml   LBM: Last BM Date: 09/12/14 Baseline Weight: Weight: 99.791 kg (220 lb) Most recent weight: Weight: 113.3 kg (249 lb 12.5 oz) (bedscale)  Exam Findings:  General: Obese elderly Caucasian female, restless with intermittent moaning his sides with Dilaudid, 2 L O2 by nasal cannula HEENT: Bruising overlying right eye, obese  neck Cardiac: Irregular, no rubs, murmurs or gallops Pulm: clear to auscultation bilaterally with anterior auscultation only Abd: Obese abdomen, soft, nontender, nondistended, BS present Ext: Cool to touch in all 4 extremities, 2+ pitting edema right greater than left Neuro: Alert but appears intermittently confused and restless. Moves 4 extremities          Palliative Performance Scale: 20               Additional Data Reviewed: Recent Labs     09/13/14  0936  09/14/14  0600  WBC  10.4  15.5*  HGB  9.7*  8.9*  PLT  176  209  NA  137  135  BUN  88*  86*  CREATININE  3.00*  3.48*     Time In: 1450   Time Out: 1600 Time Total: 70 Greater than 50%  of this time was spent counseling and coordinating care related to the above assessment and plan.  Signed by: Micheline Rough, MD  Micheline Rough, MD  09/14/2014, 4:23 PM  Please contact Palliative Medicine Team phone at [phone] for questions and concerns.

## 2014-09-14 NOTE — Progress Notes (Signed)
CSW notified by Palliative MD that patient is now being recommended for Residential Hospice. CSW and BSW Intern met with patient's daughter [name] [name] and sonJonni [name] to discuss hospice referral process. Family prefers [facility] and they were given a list of [facility].  Their second choice would be [facility] in [place] if unable to obtain placement at [facility].  CSW contacted [name], LCSW Liaison for [facility] and referral completed. She will follow up with family in the morning once it has been determined if patient is appropriate for residential Hospice.  Daughter noted to be tearful but accepting of above as is patient's son [name] [name]. Patient has been provided pain medication and is now sleeping. She is aware, per Palliative MD, of residential hospice need. CSW services will monitor and assist accordingly.  Lorie Phenix. Pauline Good, [place]

## 2014-09-14 NOTE — Progress Notes (Addendum)
Patient Name: [name] [name] Date of Encounter: 09/14/2014     Principal Problem:   Atrial fibrillation with RVR Active Problems:   Pure hypercholesterolemia   Benign hypertensive heart disease without heart failure   Mild to Moderate Ao Stenosis   PAC (premature atrial contraction)   Diabetic peripheral neuropathy associated with type 2 diabetes mellitus   Type II diabetes mellitus   COPD GOLD II    Chronic low back pain   Severe obesity (BMI >= 40)   AKI (acute kidney injury)   Subconjunctival hemorrhage of right eye   Fall   Acute renal failure superimposed on stage 4 chronic kidney disease   Acute on chronic diastolic CHF (congestive heart failure), NYHA class 2   Acute renal failure superimposed on stage 3 chronic kidney disease    SUBJECTIVE  Lethargic this morning.  Opens eyes and answers questions, not eating per sister.  Remains in rate-controlled afib.  CURRENT MEDS . amiodarone  200 mg Oral BID  . bisoprolol  7.5 mg Oral Daily  . budesonide-formoterol  2 puff Inhalation BID  . calcium citrate  200 mg of elemental calcium Oral Daily  . Chlorhexidine Gluconate Cloth  6 each Topical Q0600  . cyanocobalamin  1,000 mcg Intramuscular Weekly  . erythromycin   Right Eye TID  . famotidine  20 mg Oral Daily  . feeding supplement (ENSURE ENLIVE)  237 mL Oral BID BM  . folic acid  1 mg Oral Daily  . gabapentin  300 mg Oral BID  . insulin aspart  0-9 Units Subcutaneous TID WC  . insulin glargine  10 Units Subcutaneous QHS  . mupirocin ointment  1 application Nasal BID  . nystatin cream   Topical BID  . pantoprazole  40 mg Oral q morning - 10a  . sodium chloride  3 mL Intravenous Q12H    OBJECTIVE  Filed Vitals:   09/13/14 2104 09/13/14 2205 09/14/14 0215 09/14/14 0736  BP:  96/47 104/48 92/45  Pulse:  105 94 69  Temp:  97.8 F (36.6 C) 98.2 F (36.8 C) 98 F (36.7 C)  TempSrc:  Oral Axillary Oral  Resp:  20 20 18   Height:      Weight:    249 lb 12.5 oz  (113.3 kg)  SpO2: 96%  99% 93%    Intake/Output Summary (Last 24 hours) at 09/14/14 1021 Last data filed at 09/14/14 0809  Gross per 24 hour  Intake 1814.85 ml  Output     40 ml  Net 1774.85 ml   Filed Weights   09/12/14 0338 09/13/14 0440 09/14/14 0736  Weight: 236 lb 5.3 oz (107.2 kg) 242 lb 8.1 oz (110 kg) 249 lb 12.5 oz (113.3 kg)    PHYSICAL EXAM  General: lethargic. Neuro: opens eyes to verbal/tactile stimuli.  Quickly falls back to sleep. Psych: lethargic. HEENT: Significant right eye/facial swelling and bruising. No significant d/c or bleeding. Overall, eye/face look better than yesterday. Neck: Supple, obese without bruits. Difficult to gauge JVP 2/2 girth. Lungs: Resp regular and unlabored, diminished breath sounds bilat - poor effort. Heart: IR, IR, distant. Abdomen: Soft, non-tender, non-distended, BS + x 4.  Extremities: No clubbing, cyanosis.  Trace to 1+ bilat ankle edema, R>L. DP/PT/Radials 2+ and equal bilaterally.  Accessory Clinical Findings  CBC  Recent Labs  09/13/14 0936 09/14/14 0600  WBC 10.4 15.5*  HGB 9.7* 8.9*  HCT 32.8* 30.3*  MCV 92.9 92.4  PLT 176 237   Basic Metabolic  Panel  Recent Labs  09/13/14 0936 09/14/14 0600  NA 137 135  K 4.7 4.6  CL 102 100*  CO2 23 23  GLUCOSE 173* 185*  BUN 88* 86*  CREATININE 3.00* 3.48*  CALCIUM 8.2* 7.8*   Cardiac Enzymes  Recent Labs  09/11/14 1501 09/11/14 1909 09/12/14 0125  TROPONINI 0.03 0.03 0.03   Hemoglobin A1C  Recent Labs  09/11/14 1501  HGBA1C 9.3*   Thyroid Function Tests  Recent Labs  09/11/14 1501  TSH 1.746    TELE: Afib, 80's.  Radiology/Studies  Dg Chest 2 View  09/11/2014   CLINICAL DATA:  [place] today. Back and chest pain. Difficulty breathing.  EXAM: CHEST  2 VIEW  COMPARISON:  08/24/2014  FINDINGS: The heart is mildly enlarged but stable. The mediastinal and hilar contours are within normal limits and unchanged. No acute pulmonary findings.  No pleural effusion. The bony thorax is intact.  IMPRESSION: Stable cardiac enlargement.  No acute pulmonary findings.   Electronically Signed   By: Marijo Sanes M.D.   On: 09/11/2014 13:52   Ct Head Wo Contrast  09/11/2014   CLINICAL DATA:  81 year old fell from bed to night stand. Swelling to the right orbit and cheek bone. Headache.  EXAM: CT HEAD WITHOUT CONTRAST  CT MAXILLOFACIAL WITHOUT CONTRAST  CT CERVICAL SPINE WITHOUT CONTRAST  TECHNIQUE: Multidetector CT imaging of the head, cervical spine, and maxillofacial structures were performed using the standard protocol without intravenous contrast. Multiplanar CT image reconstructions of the cervical spine and maxillofacial structures were also generated.  COMPARISON:  08/24/2014  FINDINGS: CT HEAD FINDINGS  There is some motion artifact. There is stable mild cerebral atrophy. Stable subtle low-density in the white matter. No evidence for acute hemorrhage, mass lesion, midline shift, hydrocephalus or large infarct. Large amount of swelling in the right periorbital region, right cheek and right forehead. Mild mucosal thickening in the sphenoid sinuses. No evidence for a calvarial fracture.  CT MAXILLOFACIAL FINDINGS  Soft tissue swelling involving the right cheek, right periorbital region and right forehead. Globes are intact. Mandible is intact. The mandibular condyles are located. Mild mucosal thickening in the sphenoid sinuses. No evidence for an acute facial bone fracture. Specifically, the pterygoid plates are intact. Degenerative facet disease in the cervical spine. Incidentally, there is periapical lucency involving the right lower second molar. The lucency or caries involving the right lower canine tooth.  CT CERVICAL SPINE FINDINGS  Limited evaluation of the lung apices. Negative for fracture or dislocation in cervical spine. No soft tissue swelling in the neck. Bilateral facet disease in the cervical spine. Bone detail in the lower neck at the  cervicothoracic junction is limited. Overall alignment of cervical spine is normal. There is disc space narrowing at C6-C7 with bridging anterior osteophytes.  IMPRESSION: No acute intracranial abnormality. Mild atrophy and evidence for chronic small vessel ischemic changes.  Extensive soft tissue swelling on the right side of the face. No underlying fracture.  Multilevel degenerative changes in the cervical spine without acute bone abnormality.   Electronically Signed   By: Markus Daft M.D.   On: 09/11/2014 09:24   Ct Cervical Spine Wo Contrast  09/11/2014   CLINICAL DATA:  81 year old fell from bed to night stand. Swelling to the right orbit and cheek bone. Headache.  EXAM: CT HEAD WITHOUT CONTRAST  CT MAXILLOFACIAL WITHOUT CONTRAST  CT CERVICAL SPINE WITHOUT CONTRAST  TECHNIQUE: Multidetector CT imaging of the head, cervical spine, and maxillofacial structures were performed using the  standard protocol without intravenous contrast. Multiplanar CT image reconstructions of the cervical spine and maxillofacial structures were also generated.  COMPARISON:  08/24/2014  FINDINGS: CT HEAD FINDINGS  There is some motion artifact. There is stable mild cerebral atrophy. Stable subtle low-density in the white matter. No evidence for acute hemorrhage, mass lesion, midline shift, hydrocephalus or large infarct. Large amount of swelling in the right periorbital region, right cheek and right forehead. Mild mucosal thickening in the sphenoid sinuses. No evidence for a calvarial fracture.  CT MAXILLOFACIAL FINDINGS  Soft tissue swelling involving the right cheek, right periorbital region and right forehead. Globes are intact. Mandible is intact. The mandibular condyles are located. Mild mucosal thickening in the sphenoid sinuses. No evidence for an acute facial bone fracture. Specifically, the pterygoid plates are intact. Degenerative facet disease in the cervical spine. Incidentally, there is periapical lucency involving the  right lower second molar. The lucency or caries involving the right lower canine tooth.  CT CERVICAL SPINE FINDINGS  Limited evaluation of the lung apices. Negative for fracture or dislocation in cervical spine. No soft tissue swelling in the neck. Bilateral facet disease in the cervical spine. Bone detail in the lower neck at the cervicothoracic junction is limited. Overall alignment of cervical spine is normal. There is disc space narrowing at C6-C7 with bridging anterior osteophytes.  IMPRESSION: No acute intracranial abnormality. Mild atrophy and evidence for chronic small vessel ischemic changes.  Extensive soft tissue swelling on the right side of the face. No underlying fracture.  Multilevel degenerative changes in the cervical spine without acute bone abnormality.   Electronically Signed   By: Markus Daft M.D.   On: 09/11/2014 09:24   US Renal  09/12/2014   CLINICAL DATA:  Acute renal failure. History of hypertension and diabetes. Subsequent encounter.  EXAM: RENAL / URINARY TRACT ULTRASOUND COMPLETE  COMPARISON:  Renal ultrasound 08/21/2014.  FINDINGS: Right Kidney:  Length: 10.7 cm. Echogenicity within normal limits. No mass or hydronephrosis visualized.  Left Kidney:  Length: 11.0 cm. The left kidney is partly obscured by bowel gas and suboptimally visualized. No evidence of hydronephrosis or renal mass.  Bladder:  Decompressed and not visualized. A small amount of pelvic ascites noted.  Of note, examination limited by body habitus.  IMPRESSION: Stable renal ultrasound demonstrating no hydronephrosis. The left kidney is not well visualized. Pelvic ascites noted.   Electronically Signed   By: Richardean Sale M.D.   On: 09/12/2014 13:12   Ct Maxillofacial Wo Cm  09/11/2014   CLINICAL DATA:  81 year old fell from bed to night stand. Swelling to the right orbit and cheek bone. Headache.  EXAM: CT HEAD WITHOUT CONTRAST  CT MAXILLOFACIAL WITHOUT CONTRAST  CT CERVICAL SPINE WITHOUT CONTRAST  TECHNIQUE:  Multidetector CT imaging of the head, cervical spine, and maxillofacial structures were performed using the standard protocol without intravenous contrast. Multiplanar CT image reconstructions of the cervical spine and maxillofacial structures were also generated.  COMPARISON:  08/24/2014  FINDINGS: CT HEAD FINDINGS  There is some motion artifact. There is stable mild cerebral atrophy. Stable subtle low-density in the white matter. No evidence for acute hemorrhage, mass lesion, midline shift, hydrocephalus or large infarct. Large amount of swelling in the right periorbital region, right cheek and right forehead. Mild mucosal thickening in the sphenoid sinuses. No evidence for a calvarial fracture.  CT MAXILLOFACIAL FINDINGS  Soft tissue swelling involving the right cheek, right periorbital region and right forehead. Globes are intact. Mandible is intact. The mandibular  condyles are located. Mild mucosal thickening in the sphenoid sinuses. No evidence for an acute facial bone fracture. Specifically, the pterygoid plates are intact. Degenerative facet disease in the cervical spine. Incidentally, there is periapical lucency involving the right lower second molar. The lucency or caries involving the right lower canine tooth.  CT CERVICAL SPINE FINDINGS  Limited evaluation of the lung apices. Negative for fracture or dislocation in cervical spine. No soft tissue swelling in the neck. Bilateral facet disease in the cervical spine. Bone detail in the lower neck at the cervicothoracic junction is limited. Overall alignment of cervical spine is normal. There is disc space narrowing at C6-C7 with bridging anterior osteophytes.  IMPRESSION: No acute intracranial abnormality. Mild atrophy and evidence for chronic small vessel ischemic changes.  Extensive soft tissue swelling on the right side of the face. No underlying fracture.  Multilevel degenerative changes in the cervical spine without acute bone abnormality.    Electronically Signed   By: Markus Daft M.D.   On: 09/11/2014 09:24    ASSESSMENT AND PLAN  1. Afib RVR: Pt presented 8/29 following fall from bed resulting to significant bruising and swelling to the right side of her face and eye. She was found to be back in afib with mild volume overload. Amio started 8/29 and currently she remains in rate-controlled afib in the 80's.   2. Acute on chronic diastolic CHF: Pt had been noticing mild DOE, lower ext edema, and bendopnea since last Thursday.   3. R subconjunctival hemorrhage: Seen by ophthalmology. No acute need for intervention. Stable on anticoagulation.  4. Acute on chronic stage VI kidney dzs: BUN/Creat continues to worsen. Appreciate renal consult.   5. Hyperkalemia: In setting of CKD IV, stable today.   6.  AMS:  Pt very lethargic this AM but does answer questions.   Plan: Appreciate Dr Adin Hector input. It appears Palliative Care is most appropriate. Will hold off on attempting cardioversion. Change Amiodarone to PO for rate control as B/P is still soft.   Agree with Koren Bound note above. Her heart rate is adequately controlled. No indication for proceeding with DCCV at this point. Renal function worse. I agree with plans for palliative care consult. Spoke with sister in room.

## 2014-09-14 NOTE — Progress Notes (Signed)
PROGRESS NOTE  [name] [name] TMH:[id number] DOB: 01/15/1930 DOA: 09/11/2014 PCP: Cari Caraway, MD  Brief history 81 y.o. female, with diabetes and peripheral neuropathy, morbid obesity, recent hospitalization and cardioversion for atrial fibrillation with RVR who presents to the emergency department today after rolling out of bed and sustaining a trauma to her right eye. She was found to be in atrial fibrillation with RVR. She was discharged from [facility] on 8/11 and went to [facility] until 8/27 after which she went home with home health services. She reports rolling out of bed at approximately 6 am on the morning of admission and hitting her eye on the way down. The patient was seen by cardiology down, and she was placed on heparin and amiodarone drip initially.Unfortunately, the patient developed acute on chronic renal failure. Nephrology was consulted. The patient continued to become encephalopathic After a long discussion with the patient's family, the patient is family and the patient herself expressed desire to transition to a focus of comfort care.  Assessment/Plan: 1. Acute on chronic renal failure (CKD stage 3) -Baseline creatinine 1.2-1.5 -Serum creatinine continues to rise -Initially received intravenous dose of furosemide -Consult nephrology--spoke with Dr. Joelyn Oms -Hold furosemide, continue intravenous fluids -Renal ultrasound negative for hydronephrosis -Urinalysis negative for pyuria, sp grav 1.026  -Renal function continues to worsen -Case was discussed with nephrology, Dr. Joelyn Oms -After discussion with the patient's family, they desire to treat a focus of care to focus more on comfort   2. Paroxysmal Afib RVR [place] Vasc score of over 3 -  -Pressure cardiology - currently on amiodarone and heparin drip,  -closely monitor hematoma as above, continue bisoprolol. Cardiology and templating another cardioversion. -Patient recently had cardioversion  08/23/2014   3. Mech.Fall with R Eye Subconjunctival hemorrhage and periorbital and right-sided facial hematoma. Ophthalmologist Dr. Valetta Close consulted, he has seen the patient and placed her on erythromycin ointment along with supportive care. Hematoma is actually improving, continue gentle heparin drip and monitor. Initiate PT. Patient's sister confirms that her hematoma and swelling have considerably improved since yesterday on admission.  4. Hypotension -blood cultures x 2--neg to date -procalcitonin--0.16 -lactic acid--3.6-->1.2   5. Peripheral Neuropathy -  -d/c nonessential medications as patient's focus of care has been transitioned to focus on comfort   6. Lower extremity edema -Venous duplex without DVT   7. HTN - off of all antihypertensives secondary to hypotension -Discontinued ARB at the time of admission   8.COPD - no acute issues, no wheezing on exam. Continue home albuterol-2 nebs and Symbicort.  -Presently stable 2 L   9. DM 2 - oral medications held currently  -09/11/2014 hemoglobin A1c 9.3 -Discontinue insulin and also BG checks as the patient's focus of care has been transitioned to that to focus on comfort  10. Goals of care -Patient is DO NOT RESUSCITATE -After discussion with the patient and her family, they have decided to transition focus of care to more comfort -Discontinue all blood draws -Discontinue all radiographic studies -Morphine every 2 hours when necessary pain, anxiety, and longer -Scopolamine patch to help with secretions -Family to discuss home with hospice versus residential hospice -expectancy hours to days  Family Communication: family updated at beside Disposition Plan: hospice    Procedures/Studies: Dg Chest 2 View  09/11/2014   CLINICAL DATA:  [place] today. Back and chest pain. Difficulty breathing.  EXAM: CHEST  2 VIEW  COMPARISON:  08/24/2014  FINDINGS: The heart is mildly  enlarged but stable. The mediastinal and hilar  contours are within normal limits and unchanged. No acute pulmonary findings. No pleural effusion. The bony thorax is intact.  IMPRESSION: Stable cardiac enlargement.  No acute pulmonary findings.   Electronically Signed   By: Marijo Sanes M.D.   On: 09/11/2014 13:52   Ct Head Wo Contrast  09/11/2014   CLINICAL DATA:  81 year old fell from bed to night stand. Swelling to the right orbit and cheek bone. Headache.  EXAM: CT HEAD WITHOUT CONTRAST  CT MAXILLOFACIAL WITHOUT CONTRAST  CT CERVICAL SPINE WITHOUT CONTRAST  TECHNIQUE: Multidetector CT imaging of the head, cervical spine, and maxillofacial structures were performed using the standard protocol without intravenous contrast. Multiplanar CT image reconstructions of the cervical spine and maxillofacial structures were also generated.  COMPARISON:  08/24/2014  FINDINGS: CT HEAD FINDINGS  There is some motion artifact. There is stable mild cerebral atrophy. Stable subtle low-density in the white matter. No evidence for acute hemorrhage, mass lesion, midline shift, hydrocephalus or large infarct. Large amount of swelling in the right periorbital region, right cheek and right forehead. Mild mucosal thickening in the sphenoid sinuses. No evidence for a calvarial fracture.  CT MAXILLOFACIAL FINDINGS  Soft tissue swelling involving the right cheek, right periorbital region and right forehead. Globes are intact. Mandible is intact. The mandibular condyles are located. Mild mucosal thickening in the sphenoid sinuses. No evidence for an acute facial bone fracture. Specifically, the pterygoid plates are intact. Degenerative facet disease in the cervical spine. Incidentally, there is periapical lucency involving the right lower second molar. The lucency or caries involving the right lower canine tooth.  CT CERVICAL SPINE FINDINGS  Limited evaluation of the lung apices. Negative for fracture or dislocation in cervical spine. No soft tissue swelling in the neck. Bilateral  facet disease in the cervical spine. Bone detail in the lower neck at the cervicothoracic junction is limited. Overall alignment of cervical spine is normal. There is disc space narrowing at C6-C7 with bridging anterior osteophytes.  IMPRESSION: No acute intracranial abnormality. Mild atrophy and evidence for chronic small vessel ischemic changes.  Extensive soft tissue swelling on the right side of the face. No underlying fracture.  Multilevel degenerative changes in the cervical spine without acute bone abnormality.   Electronically Signed   By: Markus Daft M.D.   On: 09/11/2014 09:24   Ct Head Wo Contrast  08/24/2014   CLINICAL DATA:  81 year old female with altered mental status. Lethargy.  EXAM: CT HEAD WITHOUT CONTRAST  TECHNIQUE: Contiguous axial images were obtained from the base of the skull through the vertex without intravenous contrast.  COMPARISON:  No priors.  FINDINGS: Mild cerebral atrophy. Patchy and confluent areas of decreased attenuation are noted throughout the deep and periventricular white matter of the cerebral hemispheres bilaterally, compatible with chronic microvascular ischemic disease. No acute intracranial abnormalities. Specifically, no evidence of acute intracranial hemorrhage, no definite findings of acute/subacute cerebral ischemia, no mass, mass effect, hydrocephalus or abnormal intra or extra-axial fluid collections. Visualized paranasal sinuses and mastoids are generally well pneumatized, with exception of extensive mucosal thickening in the sphenoid sinuses. No acute displaced skull fractures are identified.  IMPRESSION: 1. No acute intracranial abnormalities. 2. Mild cerebral atrophy and chronic microvascular ischemic changes in the cerebral white matter. 3. Extensive mucosal thickening in the sphenoid sinuses, extensive mucosal thickening in sphenoid sinuses. No air-fluid levels to suggest an acute sinusitis at this time.   Electronically Signed   By: Mauri Brooklyn.D.  On: 08/24/2014 21:34   Ct Cervical Spine Wo Contrast  09/11/2014   CLINICAL DATA:  81 year old fell from bed to night stand. Swelling to the right orbit and cheek bone. Headache.  EXAM: CT HEAD WITHOUT CONTRAST  CT MAXILLOFACIAL WITHOUT CONTRAST  CT CERVICAL SPINE WITHOUT CONTRAST  TECHNIQUE: Multidetector CT imaging of the head, cervical spine, and maxillofacial structures were performed using the standard protocol without intravenous contrast. Multiplanar CT image reconstructions of the cervical spine and maxillofacial structures were also generated.  COMPARISON:  08/24/2014  FINDINGS: CT HEAD FINDINGS  There is some motion artifact. There is stable mild cerebral atrophy. Stable subtle low-density in the white matter. No evidence for acute hemorrhage, mass lesion, midline shift, hydrocephalus or large infarct. Large amount of swelling in the right periorbital region, right cheek and right forehead. Mild mucosal thickening in the sphenoid sinuses. No evidence for a calvarial fracture.  CT MAXILLOFACIAL FINDINGS  Soft tissue swelling involving the right cheek, right periorbital region and right forehead. Globes are intact. Mandible is intact. The mandibular condyles are located. Mild mucosal thickening in the sphenoid sinuses. No evidence for an acute facial bone fracture. Specifically, the pterygoid plates are intact. Degenerative facet disease in the cervical spine. Incidentally, there is periapical lucency involving the right lower second molar. The lucency or caries involving the right lower canine tooth.  CT CERVICAL SPINE FINDINGS  Limited evaluation of the lung apices. Negative for fracture or dislocation in cervical spine. No soft tissue swelling in the neck. Bilateral facet disease in the cervical spine. Bone detail in the lower neck at the cervicothoracic junction is limited. Overall alignment of cervical spine is normal. There is disc space narrowing at C6-C7 with bridging anterior osteophytes.   IMPRESSION: No acute intracranial abnormality. Mild atrophy and evidence for chronic small vessel ischemic changes.  Extensive soft tissue swelling on the right side of the face. No underlying fracture.  Multilevel degenerative changes in the cervical spine without acute bone abnormality.   Electronically Signed   By: Markus Daft M.D.   On: 09/11/2014 09:24   Mr Brain Wo Contrast  08/21/2014   CLINICAL DATA:  Mental status changes. Rapid atrial fibrillation. Confusion. Memory loss.  EXAM: MRI HEAD WITHOUT CONTRAST  TECHNIQUE: Multiplanar, multiecho pulse sequences of the brain and surrounding structures were obtained without intravenous contrast.  COMPARISON:  None.  FINDINGS: The diffusion-weighted images demonstrate no evidence for acute or subacute infarction.  The study is moderately degraded by patient motion. Moderate atrophy and periventricular white matter change bilaterally is somewhat advanced for age. No acute infarct, hemorrhage, or mass lesion is present. The ventricles are of normal size. No significant extra-axial fluid collections are present.  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 White matter changes extend into the brainstem.  Flow is present in the major intracranial arteries. Bilateral lens replacements are present. Circumferential mucosal thickening is present in the posterior sphenoid sinus bilaterally. The remaining paranasal sinuses and the mastoid air cells are clear.  Midline structures are within normal limits.  IMPRESSION: 1. No acute intracranial abnormality. 2. Age advanced atrophy and white matter disease. This likely reflects the sequela of chronic microvascular ischemia.   Electronically Signed   By: San Morelle M.D.   On: 08/21/2014 14:28   US Renal  09/12/2014   CLINICAL DATA:  Acute renal failure. History of hypertension and diabetes. Subsequent encounter.  EXAM: RENAL / URINARY TRACT ULTRASOUND COMPLETE  COMPARISON:  Renal ultrasound 08/21/2014.  FINDINGS: Right Kidney:  Length: 10.7 cm. Echogenicity within normal limits. No mass or hydronephrosis visualized.  Left Kidney:  Length: 11.0 cm. The left kidney is partly obscured by bowel gas and suboptimally visualized. No evidence of hydronephrosis or renal mass.  Bladder:  Decompressed and not visualized. A small amount of pelvic ascites noted.  Of note, examination limited by body habitus.  IMPRESSION: Stable renal ultrasound demonstrating no hydronephrosis. The left kidney is not well visualized. Pelvic ascites noted.   Electronically Signed   By: Richardean Sale M.D.   On: 09/12/2014 13:12   US Renal  08/21/2014   CLINICAL DATA:  81 year old female with acute renal injury. Initial encounter.  EXAM: RENAL / URINARY TRACT ULTRASOUND COMPLETE  COMPARISON:  Lumbar spine MRI 01/11/2013.  Chest CTA 02/01/2014.  FINDINGS: Right Kidney:  Length: 10.6 cm. No hydronephrosis. Cortical echogenicity within normal limits. No right renal mass.  Left Kidney:  Length: 10.1 cm. No hydronephrosis. Cortical echotexture within normal limits. No left renal mass.  Bladder:  Decompressed, Foley catheter balloon visible.  IMPRESSION: No hydronephrosis or acute renal findings.   Electronically Signed   By: Genevie Ann M.D.   On: 08/21/2014 16:13   Dg Chest Port 1 View  09/13/2014   CLINICAL DATA:  Congestive heart failure, shortness of breath.  EXAM: PORTABLE CHEST - 1 VIEW  COMPARISON:  09/11/2014 and CT chest 02/09/2014.  FINDINGS: Trachea is midline. Heart size stable. Lungs are somewhat low in volume with probable bibasilar atelectasis. No airspace consolidation or pleural fluid.  IMPRESSION: Low lung volumes with probable bibasilar atelectasis.   Electronically Signed   By: Lorin Picket M.D.   On: 09/13/2014 09:47   Dg Chest Port 1 View  08/24/2014   CLINICAL DATA:  Pt c/o being "tired and cold" x1 day. Pt was released from [facility] this morning after being treated for A.Fib.Pt denies pain and SOB.Hx of HTN, DM, mild  aortic stenosis, PNA, bronchitis.Nonsmoker.  EXAM: PORTABLE CHEST - 1 VIEW  COMPARISON:  08/18/2014  FINDINGS: Mild enlargement of the cardiac silhouette. No mediastinal or hilar masses or evidence of adenopathy.  Clear lungs.  No pleural effusion or pneumothorax.  Bony thorax is demineralized but grossly intact.  IMPRESSION: No active disease.   Electronically Signed   By: Lajean Manes M.D.   On: 08/24/2014 19:32   Dg Chest Port 1 View  08/18/2014   CLINICAL DATA:  Shortness of breath today with exertion.  EXAM: PORTABLE CHEST - 1 VIEW  COMPARISON:  03/31/2014  FINDINGS: Heart is borderline in size. No confluent airspace opacities, effusions or edema. No acute bony abnormality.  IMPRESSION: No active disease.   Electronically Signed   By: Rolm Baptise M.D.   On: 08/18/2014 11:39   Ct Maxillofacial Wo Cm  09/11/2014   CLINICAL DATA:  81 year old fell from bed to night stand. Swelling to the right orbit and cheek bone. Headache.  EXAM: CT HEAD WITHOUT CONTRAST  CT MAXILLOFACIAL WITHOUT CONTRAST  CT CERVICAL SPINE WITHOUT CONTRAST  TECHNIQUE: Multidetector CT imaging of the head, cervical spine, and maxillofacial structures were performed using the standard protocol without intravenous contrast. Multiplanar CT image reconstructions of the cervical spine and maxillofacial structures were also generated.  COMPARISON:  08/24/2014  FINDINGS: CT HEAD FINDINGS  There is some motion artifact. There is stable mild cerebral atrophy. Stable subtle low-density in the white matter. No evidence for acute hemorrhage, mass lesion, midline shift, hydrocephalus or large infarct. Large amount of swelling  in the right periorbital region, right cheek and right forehead. Mild mucosal thickening in the sphenoid sinuses. No evidence for a calvarial fracture.  CT MAXILLOFACIAL FINDINGS  Soft tissue swelling involving the right cheek, right periorbital region and right forehead. Globes are intact. Mandible is intact. The mandibular  condyles are located. Mild mucosal thickening in the sphenoid sinuses. No evidence for an acute facial bone fracture. Specifically, the pterygoid plates are intact. Degenerative facet disease in the cervical spine. Incidentally, there is periapical lucency involving the right lower second molar. The lucency or caries involving the right lower canine tooth.  CT CERVICAL SPINE FINDINGS  Limited evaluation of the lung apices. Negative for fracture or dislocation in cervical spine. No soft tissue swelling in the neck. Bilateral facet disease in the cervical spine. Bone detail in the lower neck at the cervicothoracic junction is limited. Overall alignment of cervical spine is normal. There is disc space narrowing at C6-C7 with bridging anterior osteophytes.  IMPRESSION: No acute intracranial abnormality. Mild atrophy and evidence for chronic small vessel ischemic changes.  Extensive soft tissue swelling on the right side of the face. No underlying fracture.  Multilevel degenerative changes in the cervical spine without acute bone abnormality.   Electronically Signed   By: Markus Daft M.D.   On: 09/11/2014 09:24         Subjective: patient is encephalopathic. She awakens intermittently but not answering questions appropriate. No signs of respiratory distress, uncontrolled pain, vomiting, diarrhea.   Objective: Filed Vitals:   09/13/14 2205 09/14/14 0215 09/14/14 0736 09/14/14 1043  BP: 96/47 104/48 92/45 94/41   Pulse: 105 94 69 111  Temp: 97.8 F (36.6 C) 98.2 F (36.8 C) 98 F (36.7 C)   TempSrc: Oral Axillary Oral   Resp: 20 20 18 24   Height:      Weight:   113.3 kg (249 lb 12.5 oz)   SpO2:  99% 93%     Intake/Output Summary (Last 24 hours) at 09/14/14 1204 Last data filed at 09/14/14 0809  Gross per 24 hour  Intake 1814.85 ml  Output     40 ml  Net 1774.85 ml   Weight change:  Exam:   General:  Pt is  somnolent ,  not in acute distress  HEENT: No icterus, No thrush, No  meningismus, [place]/AT  Cardiovascular: RRR, S1/S2, no rubs, no gallops  Respiratory: bibasilar crackles. No wheeze.   Abdomen: Soft/+BS, non tender, non distended, no guarding  Extremities: trace LE edema, No lymphangitis, No petechiae, No rashes, no synovitis  Data Reviewed: Basic Metabolic Panel:  Recent Labs Lab 09/11/14 1909 09/12/14 0125 09/12/14 1421 09/13/14 0936 09/14/14 0600  NA 135 133* 135 137 135  K 6.2* 5.7* 5.6* 4.7 4.6  CL 100* 99* 102 102 100*  CO2 26 23 21* 23 23  GLUCOSE 178* 251* 237* 173* 185*  BUN 79* 84* 89* 88* 86*  CREATININE 2.01* 2.00* 2.42* 3.00* 3.48*  CALCIUM 8.7* 8.5* 8.5* 8.2* 7.8*   Liver Function Tests: No results for input(s): AST, ALT, ALKPHOS, BILITOT, PROT, ALBUMIN in the last 168 hours. No results for input(s): LIPASE, AMYLASE in the last 168 hours. No results for input(s): AMMONIA in the last 168 hours. CBC:  Recent Labs Lab 09/11/14 0825 09/12/14 0125 09/13/14 0936 09/14/14 0600  WBC 12.8* 11.3* 10.4 15.5*  NEUTROABS 10.2*  --   --   --   HGB 9.6* 9.7* 9.7* 8.9*  HCT 32.0* 32.8* 32.8* 30.3*  MCV 88.9 90.4  92.9 92.4  PLT 296 302 176 209   Cardiac Enzymes:  Recent Labs Lab 09/11/14 1501 09/11/14 1909 09/12/14 0125  TROPONINI 0.03 0.03 0.03   BNP: Invalid input(s): POCBNP CBG:  Recent Labs Lab 09/13/14 0545 09/13/14 1136 09/13/14 1647 09/13/14 2206 09/14/14 0721  GLUCAP 149* 153* 149* 132* 170*    Recent Results (from the past 240 hour(s))  MRSA PCR Screening     Status: Abnormal   Collection Time: 09/11/14  7:09 PM  Result Value Ref Range Status   MRSA by PCR POSITIVE (A) NEGATIVE Final    Comment:        The GeneXpert MRSA Assay (FDA approved for NASAL specimens only), is one component of a comprehensive MRSA colonization surveillance program. It is not intended to diagnose MRSA infection nor to guide or monitor treatment for MRSA infections. RESULT CALLED TO, READ BACK BY AND VERIFIED WITH: D  HART RN 2115 09/11/14 A BROWNING   Urine culture     Status: None   Collection Time: 09/13/14  6:47 AM  Result Value Ref Range Status   Specimen Description URINE, CATHETERIZED  Final   Special Requests NONE  Final   Culture MULTIPLE SPECIES PRESENT, SUGGEST RECOLLECTION  Final   Report Status 09/14/2014 FINAL  Final     Scheduled Meds: . amiodarone  200 mg Oral BID  . budesonide-formoterol  2 puff Inhalation BID  . Chlorhexidine Gluconate Cloth  6 each Topical Q0600  . erythromycin   Right Eye TID  . feeding supplement (ENSURE ENLIVE)  237 mL Oral BID BM  . mupirocin ointment  1 application Nasal BID  . nystatin cream   Topical BID  . sodium chloride  3 mL Intravenous Q12H   Continuous Infusions:    Kutter Schnepf, DO  [facility] Pager [phone]  If 7PM-7AM, please contact night-coverage [email or web address] Password [email or web address] 09/14/2014, 12:04 PM   LOS: 3 days

## 2014-09-14 NOTE — Clinical Social Work Note (Signed)
Clinical Social Work Assessment  Patient Details  Name: [name] [name] MRN: [id number] Date of Birth: May 17, 1930  Date of referral:  09/13/14               Reason for consult:  Facility Placement (SNF vs Home?)                Permission sought to share information with:  Family Supports, Chartered certified accountant granted to share information::  Yes, Verbal Permission Granted  Name::     Chief Financial Officer SNFs and daughter  Agency::     Relationship::     Contact Information:     Housing/Transportation Living arrangements for the past 2 months:  Single Family Home Source of Information:  Patient, Adult Children Patient Interpreter Needed:  None Criminal Activity/Legal Involvement Pertinent to Current Situation/Hospitalization:  No - Comment as needed Significant Relationships:  Adult Children Lives with:  Spouse Do you feel safe going back to the place where you live?  Yes Need for family participation in patient care:  Yes (Comment)  Care giving concerns: Patient d/c'd home from [facility] several days ago. Fell from her bed and is very bruised on right side of her face.  Husband has early dementia per daughter.   Social Worker assessment / plan:  CSW received referral to talk to patient/family about SNF placement.  Met with patient, husband and daughter [name] [name].  Patient noted to be lying in bed; appeared to be alert and oriented and was involved in the conversation- however she was unable to clearly recognize her daughter or husband or sister. This is not baseline per daughter.  Discussed that patient has just recently d/c'd from [facility] to home; daughter is concerned that she left too soon and had multiple concerns about the care that patient has received both in the hospital and at the SNF.  Daughter agrees to SNF search but is still considering return home with home health and has hired 3 private care sitters.  Fl2 will be placed on chart and search  initiated.  Employment status:  Retired Nurse, adult PT Recommendations:  [place] / Referral to community resources:  [place]  Patient/Family's Response to care:  Patient is alert and oriented to person.  States that she wants to return home.  She states that her back hurts all the time and wants to "rest."  Daughter noted to be extremely involved and supportive.   Patient/Family's Understanding of and Emotional Response to Diagnosis, Current Treatment, and Prognosis:  Patient states that she knows she fell but does not appear to have a strong understanding of her current diagnosis or prognosis.  Her daughter is extremely emotional and at times tearful. Verbalizes frustrations that she feels her mother did not receive adequate after care during an event of bronchitis. She is very involved and aware of her mothers' current medical condition and prognosis.  Emotional Assessment Appearance:  Appears stated age Attitude/Demeanor/Rapport:   (Calm, Somewhat confused) Affect (typically observed):  Quiet, Calm, Pleasant Orientation:  Fluctuating Orientation (Suspected and/or reported Sundowners), Oriented to Self Alcohol / Substance use:  Tobacco Use (Former smoker- not current) Psych involvement (Current and /or in the community):  No (Comment)  Discharge Needs  Concerns to be addressed:  Care Coordination, Discharge Planning Concerns, Other (Comment Required (Daughter is unsure if they should take her back home or seek SNF again) Readmission within the last 30 days:  Yes Current discharge risk:  Dependent with Mobility (Husband has early dementia) Barriers to Discharge:  Continued Medical Work up   Estill Bakes 09/13/2014   2:50 PM

## 2014-09-14 NOTE — Telephone Encounter (Signed)
Spoke to Mrs [name] [name], her daughter, pt now in palliative care for multiple medical issues

## 2014-09-15 ENCOUNTER — Ambulatory Visit: Payer: Medicare HMO | Admitting: Cardiology

## 2014-09-15 DIAGNOSIS — N184 Chronic kidney disease, stage 4 (severe): Secondary | ICD-10-CM

## 2014-09-15 DIAGNOSIS — Z515 Encounter for palliative care: Secondary | ICD-10-CM | POA: Insufficient documentation

## 2014-09-15 MED ORDER — GLYCOPYRROLATE 0.2 MG/ML IJ SOLN: 0.2000 mg | INTRAMUSCULAR | Status: AC | PRN

## 2014-09-15 MED ORDER — MORPHINE SULFATE (CONCENTRATE) 10 MG /0.5 ML PO SOLN: 4.0000 mg | ORAL | Status: AC | PRN

## 2014-09-15 MED ORDER — LORAZEPAM 2 MG/ML IJ SOLN
1.0000 mg | Freq: Once | INTRAMUSCULAR | Status: AC
  Administered 2014-09-15: 1 mg via INTRAMUSCULAR

## 2014-09-15 MED ORDER — ERYTHROMYCIN 5 MG/GM OP OINT: TOPICAL_OINTMENT | Freq: Three times a day (TID) | OPHTHALMIC | Status: AC

## 2014-09-15 MED ORDER — HALOPERIDOL LACTATE 2 MG/ML PO CONC: 0.6000 mg | ORAL | Status: AC | PRN

## 2014-09-15 NOTE — Clinical Social Work Note (Addendum)
CSW spoke to [place] worker, [facility] does have a bed available for patient today.  CSW notified physician, DNR form on patient's chart awaiting signature by physician.  CSW to continue to follow patient's discharge planning.  12:00pm Patient to be d/c'ed today to [facility].  Patient and family agreeable to plans will transport via ems RN to call report.  Jones Broom. [place], MSW, [place] 09/15/2014 10:53 AM

## 2014-09-15 NOTE — Progress Notes (Signed)
Patient has transferred from [place] to [facility]. Oral handoff report provided to Randall Hiss- Unit CSW. Notified [name], [place] of move to [facility]N. Patient has transitioned to comfort care and awaiting possible bed offer from [facility].  CSW will sign off.  Lorie Phenix. Pauline Good, [place]

## 2014-09-15 NOTE — Consult Note (Signed)
[place] Liaison: Received request from [place] for family interest in [facility]. Chart reviewed. Spoke with Daughter by phone and met with son to complete transfer paper work. Both agreeable to transfer today. Discharge summary faxed.   RN please call report to [phone].  Thank you.  [name], [place]

## 2014-09-15 NOTE — Discharge Summary (Signed)
Physician Discharge Summary  [name] [name] JQB:[id number] DOB: Jan 07, 1931 DOA: 09/11/2014  PCP: Cari Caraway, MD  Admit date: 09/11/2014 Discharge date: 09/15/2014  Patient discharged to residential hospice  Discharge Diagnoses:  1. Acute on chronic renal failure (CKD stage 3) -Baseline creatinine 1.2-1.5 -Serum creatinine continues to rise -Initially received intravenous dose of furosemide -Consult nephrology--spoke with Dr. Joelyn Oms -Hold furosemide, continue intravenous fluids -Renal ultrasound negative for hydronephrosis -Urinalysis negative for pyuria, sp grav 1.026  -Renal function continues to worsen -Case was discussed with nephrology, Dr. Joelyn Oms -After discussion with the patient's family, they desire to treat a focus of care to focus more on comfort -all nonessential medications and all labs and radiographic studies were discontinued   2. Paroxysmal Afib RVR [place] Vasc score of over 3 -  -Pressure cardiology - currently on amiodarone and heparin drip which were discontinued as pt's focus of care transitioned to focus on comfort -Patient recently had cardioversion 08/23/2014   3. Mech.Fall with R Eye Subconjunctival hemorrhage and periorbital and right-sided facial hematoma. Ophthalmologist Dr. Valetta Close consulted, he has seen the patient and placed her on erythromycin ointment along with supportive care. Hematoma is actually improving, continue gentle heparin drip and monitor. Initiate PT.   4. Hypotension -blood cultures x 2--neg to date -procalcitonin--0.16 -lactic acid--3.6-->1.2 -no longer active issue as pt's focus of care transitioned to focus on comfort   5. Peripheral Neuropathy -  -d/c nonessential medications as patient's focus of care has been transitioned to focus on comfort   6. Lower extremity edema -Venous duplex without DVT   7. HTN - off of all antihypertensives secondary to hypotension -Discontinued ARB at the time of admission   8.COPD -  no acute issues, no wheezing on exam. Continue home albuterol-2 nebs and Symbicort.  -Presently stable 2 L   9. DM 2 - oral medications held currently  -09/11/2014 hemoglobin A1c 9.3 -Discontinue insulin and also BG checks as the patient's focus of care has been transitioned to that to focus on comfort  10. Goals of care -Patient is DO NOT RESUSCITATE -After discussion with the patient and her family, they have decided to transition focus of care to more comfort -Discontinue all blood draws -Discontinue all radiographic studies -Roxanol every 3 hours when necessary pain, anxiety -Robinul to help with secretions -Family agree able to residential hospice -expectancy hours to days  Family Communication: family updated at beside Disposition Plan: hospice  Discharge Condition: stable  Disposition: Residential hospice  Diet:comfort feeding Wt Readings from Last 3 Encounters:  09/14/14 113.3 kg (249 lb 12.5 oz)  09/08/14 107.502 kg (237 lb)  08/29/14 98.431 kg (217 lb)    History of present illness:  81 y.o. female, with diabetes and peripheral neuropathy, morbid obesity, recent hospitalization and cardioversion for atrial fibrillation with RVR who presents to the emergency department today after rolling out of bed and sustaining a trauma to her right eye. She was found to be in atrial fibrillation with RVR. She was [facility] 8/11 and went to [facility] until 8/27 after which she went home with home health services. She reports rolling out of bed at approximately 6 am on the morning of admission and hitting her eye on the way down. The patient was seen by cardiology down, and she was placed on heparin and amiodarone drip initially.Unfortunately, the patient developed acute on chronic renal failure. Nephrology was consulted. The patient continued to become encephalopathic After a long discussion with the patient's family, the patient  is family and the patient  herself expressed desire to transition to a focus of comfort care.   Consultants: Palliative medicine cardiology  Discharge Exam: Filed Vitals:   09/15/14 0643  BP: 89/31  Pulse: 89  Temp:   Resp: 14   Filed Vitals:   09/14/14 2226 09/15/14 0619 09/15/14 0621 09/15/14 0643  BP: 94/41 84/28 77/25  89/31  Pulse: 115 115  89  Temp: 98.8 F (37.1 C) 97.7 F (36.5 C)    TempSrc: Oral Oral    Resp: 24 25  14   Height:      Weight:      SpO2: 98% 95%  98%   General:  NAD, pleasant Cardiovascular: RRR, no rub, no gallop, no S3 Respiratory: poor inspiratory effort Abdomen:soft, nontender, nondistended, positive bowel sounds   Discharge Instructions     Medication List    STOP taking these medications        acetaminophen 325 MG tablet  Commonly known as:  TYLENOL     AEROCHAMBER MV inhaler     albuterol 108 (90 BASE) MCG/ACT inhaler  Commonly known as:  PROVENTIL HFA;VENTOLIN HFA     BIOTIN PO     bisoprolol 5 MG tablet  Commonly known as:  ZEBETA     budesonide-formoterol 80-4.5 MCG/ACT inhaler  Commonly known as:  SYMBICORT     calcium citrate 950 MG tablet  Commonly known as:  CALCITRATE - dosed in mg elemental calcium     cholecalciferol 1000 UNITS tablet  Commonly known as:  VITAMIN D     cyanocobalamin 1000 MCG/ML injection  Commonly known as:  (VITAMIN B-12)     famotidine 20 MG tablet  Commonly known as:  PEPCID     feeding supplement (ENSURE ENLIVE) Liqd     folic acid 1 MG tablet  Commonly known as:  FOLVITE     furosemide 40 MG tablet  Commonly known as:  LASIX     gabapentin 300 MG capsule  Commonly known as:  NEURONTIN     glimepiride 2 MG tablet  Commonly known as:  AMARYL     hydrOXYzine 10 MG tablet  Commonly known as:  ATARAX/VISTARIL     ipratropium-albuterol 0.5-2.5 (3) MG/3ML Soln  Commonly known as:  DUONEB     losartan 50 MG tablet  Commonly known as:  COZAAR     Melatonin 3 MG Tabs     metFORMIN 1000 MG tablet    Commonly known as:  GLUCOPHAGE     nitroGLYCERIN 0.4 MG SL tablet  Commonly known as:  NITROSTAT     pantoprazole 40 MG tablet  Commonly known as:  PROTONIX     rivaroxaban 20 MG Tabs tablet  Commonly known as:  XARELTO     rosuvastatin 5 MG tablet  Commonly known as:  CRESTOR      TAKE these medications        erythromycin ophthalmic ointment  Place into the right eye 3 (three) times daily.     glycopyrrolate 0.2 MG/ML injection  Commonly known as:  ROBINUL  Inject 1 mL (0.2 mg total) into the skin every 4 (four) hours as needed (excessive secretions).     haloperidol 2 MG/ML solution  Commonly known as:  HALDOL  Place 0.3 mLs (0.6 mg total) under the tongue every 4 (four) hours as needed for agitation (or delirium).     morphine CONCENTRATE 10 mg / 0.5 ml concentrated solution  Take 0.2 mLs (4 mg total) by  mouth every 3 (three) hours as needed for severe pain.         The results of significant diagnostics from this hospitalization (including imaging, microbiology, ancillary and laboratory) are listed below for reference.    Significant Diagnostic Studies: Dg Chest 2 View  09/11/2014   CLINICAL DATA:  [place] today. Back and chest pain. Difficulty breathing.  EXAM: CHEST  2 VIEW  COMPARISON:  08/24/2014  FINDINGS: The heart is mildly enlarged but stable. The mediastinal and hilar contours are within normal limits and unchanged. No acute pulmonary findings. No pleural effusion. The bony thorax is intact.  IMPRESSION: Stable cardiac enlargement.  No acute pulmonary findings.   Electronically Signed   By: Marijo Sanes M.D.   On: 09/11/2014 13:52   Ct Head Wo Contrast  09/11/2014   CLINICAL DATA:  81 year old fell from bed to night stand. Swelling to the right orbit and cheek bone. Headache.  EXAM: CT HEAD WITHOUT CONTRAST  CT MAXILLOFACIAL WITHOUT CONTRAST  CT CERVICAL SPINE WITHOUT CONTRAST  TECHNIQUE: Multidetector CT imaging of the head, cervical spine, and maxillofacial  structures were performed using the standard protocol without intravenous contrast. Multiplanar CT image reconstructions of the cervical spine and maxillofacial structures were also generated.  COMPARISON:  08/24/2014  FINDINGS: CT HEAD FINDINGS  There is some motion artifact. There is stable mild cerebral atrophy. Stable subtle low-density in the white matter. No evidence for acute hemorrhage, mass lesion, midline shift, hydrocephalus or large infarct. Large amount of swelling in the right periorbital region, right cheek and right forehead. Mild mucosal thickening in the sphenoid sinuses. No evidence for a calvarial fracture.  CT MAXILLOFACIAL FINDINGS  Soft tissue swelling involving the right cheek, right periorbital region and right forehead. Globes are intact. Mandible is intact. The mandibular condyles are located. Mild mucosal thickening in the sphenoid sinuses. No evidence for an acute facial bone fracture. Specifically, the pterygoid plates are intact. Degenerative facet disease in the cervical spine. Incidentally, there is periapical lucency involving the right lower second molar. The lucency or caries involving the right lower canine tooth.  CT CERVICAL SPINE FINDINGS  Limited evaluation of the lung apices. Negative for fracture or dislocation in cervical spine. No soft tissue swelling in the neck. Bilateral facet disease in the cervical spine. Bone detail in the lower neck at the cervicothoracic junction is limited. Overall alignment of cervical spine is normal. There is disc space narrowing at C6-C7 with bridging anterior osteophytes.  IMPRESSION: No acute intracranial abnormality. Mild atrophy and evidence for chronic small vessel ischemic changes.  Extensive soft tissue swelling on the right side of the face. No underlying fracture.  Multilevel degenerative changes in the cervical spine without acute bone abnormality.   Electronically Signed   By: Markus Daft M.D.   On: 09/11/2014 09:24   Ct Head Wo  Contrast  08/24/2014   CLINICAL DATA:  81 year old female with altered mental status. Lethargy.  EXAM: CT HEAD WITHOUT CONTRAST  TECHNIQUE: Contiguous axial images were obtained from the base of the skull through the vertex without intravenous contrast.  COMPARISON:  No priors.  FINDINGS: Mild cerebral atrophy. Patchy and confluent areas of decreased attenuation are noted throughout the deep and periventricular white matter of the cerebral hemispheres bilaterally, compatible with chronic microvascular ischemic disease. No acute intracranial abnormalities. Specifically, no evidence of acute intracranial hemorrhage, no definite findings of acute/subacute cerebral ischemia, no mass, mass effect, hydrocephalus or abnormal intra or extra-axial fluid collections. Visualized paranasal sinuses and mastoids  are generally well pneumatized, with exception of extensive mucosal thickening in the sphenoid sinuses. No acute displaced skull fractures are identified.  IMPRESSION: 1. No acute intracranial abnormalities. 2. Mild cerebral atrophy and chronic microvascular ischemic changes in the cerebral white matter. 3. Extensive mucosal thickening in the sphenoid sinuses, extensive mucosal thickening in sphenoid sinuses. No air-fluid levels to suggest an acute sinusitis at this time.   Electronically Signed   By: Vinnie Langton M.D.   On: 08/24/2014 21:34   Ct Cervical Spine Wo Contrast  09/11/2014   CLINICAL DATA:  80 year old fell from bed to night stand. Swelling to the right orbit and cheek bone. Headache.  EXAM: CT HEAD WITHOUT CONTRAST  CT MAXILLOFACIAL WITHOUT CONTRAST  CT CERVICAL SPINE WITHOUT CONTRAST  TECHNIQUE: Multidetector CT imaging of the head, cervical spine, and maxillofacial structures were performed using the standard protocol without intravenous contrast. Multiplanar CT image reconstructions of the cervical spine and maxillofacial structures were also generated.  COMPARISON:  08/24/2014  FINDINGS: CT HEAD  FINDINGS  There is some motion artifact. There is stable mild cerebral atrophy. Stable subtle low-density in the white matter. No evidence for acute hemorrhage, mass lesion, midline shift, hydrocephalus or large infarct. Large amount of swelling in the right periorbital region, right cheek and right forehead. Mild mucosal thickening in the sphenoid sinuses. No evidence for a calvarial fracture.  CT MAXILLOFACIAL FINDINGS  Soft tissue swelling involving the right cheek, right periorbital region and right forehead. Globes are intact. Mandible is intact. The mandibular condyles are located. Mild mucosal thickening in the sphenoid sinuses. No evidence for an acute facial bone fracture. Specifically, the pterygoid plates are intact. Degenerative facet disease in the cervical spine. Incidentally, there is periapical lucency involving the right lower second molar. The lucency or caries involving the right lower canine tooth.  CT CERVICAL SPINE FINDINGS  Limited evaluation of the lung apices. Negative for fracture or dislocation in cervical spine. No soft tissue swelling in the neck. Bilateral facet disease in the cervical spine. Bone detail in the lower neck at the cervicothoracic junction is limited. Overall alignment of cervical spine is normal. There is disc space narrowing at C6-C7 with bridging anterior osteophytes.  IMPRESSION: No acute intracranial abnormality. Mild atrophy and evidence for chronic small vessel ischemic changes.  Extensive soft tissue swelling on the right side of the face. No underlying fracture.  Multilevel degenerative changes in the cervical spine without acute bone abnormality.   Electronically Signed   By: Markus Daft M.D.   On: 09/11/2014 09:24   Mr Brain Wo Contrast  08/21/2014   CLINICAL DATA:  Mental status changes. Rapid atrial fibrillation. Confusion. Memory loss.  EXAM: MRI HEAD WITHOUT CONTRAST  TECHNIQUE: Multiplanar, multiecho pulse sequences of the brain and surrounding structures  were obtained without intravenous contrast.  COMPARISON:  None.  FINDINGS: The diffusion-weighted images demonstrate no evidence for acute or subacute infarction.  The study is moderately degraded by patient motion. Moderate atrophy and periventricular white matter change bilaterally is somewhat advanced for age. No acute infarct, hemorrhage, or mass lesion is present. The ventricles are of normal size. No significant extra-axial fluid collections are present.  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 White matter changes extend into the brainstem.  Flow is present in the major intracranial arteries. Bilateral lens replacements are present. Circumferential mucosal thickening is present in the posterior sphenoid sinus bilaterally. The remaining paranasal sinuses and the mastoid air cells are clear.  Midline structures  are within normal limits.  IMPRESSION: 1. No acute intracranial abnormality. 2. Age advanced atrophy and white matter disease. This likely reflects the sequela of chronic microvascular ischemia.   Electronically Signed   By: San Morelle M.D.   On: 08/21/2014 14:28   US Renal  09/12/2014   CLINICAL DATA:  Acute renal failure. History of hypertension and diabetes. Subsequent encounter.  EXAM: RENAL / URINARY TRACT ULTRASOUND COMPLETE  COMPARISON:  Renal ultrasound 08/21/2014.  FINDINGS: Right Kidney:  Length: 10.7 cm. Echogenicity within normal limits. No mass or hydronephrosis visualized.  Left Kidney:  Length: 11.0 cm. The left kidney is partly obscured by bowel gas and suboptimally visualized. No evidence of hydronephrosis or renal mass.  Bladder:  Decompressed and not visualized. A small amount of pelvic ascites noted.  Of note, examination limited by body habitus.  IMPRESSION: Stable renal ultrasound demonstrating no hydronephrosis. The left kidney is not well visualized. Pelvic ascites noted.   Electronically Signed   By: Richardean Sale M.D.   On: 09/12/2014 13:12    US Renal  08/21/2014   CLINICAL DATA:  81 year old female with acute renal injury. Initial encounter.  EXAM: RENAL / URINARY TRACT ULTRASOUND COMPLETE  COMPARISON:  Lumbar spine MRI 01/11/2013.  Chest CTA 02/01/2014.  FINDINGS: Right Kidney:  Length: 10.6 cm. No hydronephrosis. Cortical echogenicity within normal limits. No right renal mass.  Left Kidney:  Length: 10.1 cm. No hydronephrosis. Cortical echotexture within normal limits. No left renal mass.  Bladder:  Decompressed, Foley catheter balloon visible.  IMPRESSION: No hydronephrosis or acute renal findings.   Electronically Signed   By: Genevie Ann M.D.   On: 08/21/2014 16:13   Dg Chest Port 1 View  09/13/2014   CLINICAL DATA:  Congestive heart failure, shortness of breath.  EXAM: PORTABLE CHEST - 1 VIEW  COMPARISON:  09/11/2014 and CT chest 02/09/2014.  FINDINGS: Trachea is midline. Heart size stable. Lungs are somewhat low in volume with probable bibasilar atelectasis. No airspace consolidation or pleural fluid.  IMPRESSION: Low lung volumes with probable bibasilar atelectasis.   Electronically Signed   By: Lorin Picket M.D.   On: 09/13/2014 09:47   Dg Chest Port 1 View  08/24/2014   CLINICAL DATA:  Pt c/o being "tired and cold" x1 day. Pt was [facility] from [facility] this morning after being treated for A.Fib.Pt denies pain and SOB.Hx of HTN, DM, mild aortic stenosis, PNA, bronchitis.Nonsmoker.  EXAM: PORTABLE CHEST - 1 VIEW  COMPARISON:  08/18/2014  FINDINGS: Mild enlargement of the cardiac silhouette. No mediastinal or hilar masses or evidence of adenopathy.  Clear lungs.  No pleural effusion or pneumothorax.  Bony thorax is demineralized but grossly intact.  IMPRESSION: No active disease.   Electronically Signed   By: Lajean Manes M.D.   On: 08/24/2014 19:32   Dg Chest Port 1 View  08/18/2014   CLINICAL DATA:  Shortness of breath today with exertion.  EXAM: PORTABLE CHEST - 1 VIEW  COMPARISON:  03/31/2014  FINDINGS: Heart is borderline in  size. No confluent airspace opacities, effusions or edema. No acute bony abnormality.  IMPRESSION: No active disease.   Electronically Signed   By: Rolm Baptise M.D.   On: 08/18/2014 11:39   Ct Maxillofacial Wo Cm  09/11/2014   CLINICAL DATA:  81 year old fell from bed to night stand. Swelling to the right orbit and cheek bone. Headache.  EXAM: CT HEAD WITHOUT CONTRAST  CT MAXILLOFACIAL WITHOUT CONTRAST  CT CERVICAL SPINE WITHOUT CONTRAST  TECHNIQUE: Multidetector CT imaging of the head, cervical spine, and maxillofacial structures were performed using the standard protocol without intravenous contrast. Multiplanar CT image reconstructions of the cervical spine and maxillofacial structures were also generated.  COMPARISON:  08/24/2014  FINDINGS: CT HEAD FINDINGS  There is some motion artifact. There is stable mild cerebral atrophy. Stable subtle low-density in the white matter. No evidence for acute hemorrhage, mass lesion, midline shift, hydrocephalus or large infarct. Large amount of swelling in the right periorbital region, right cheek and right forehead. Mild mucosal thickening in the sphenoid sinuses. No evidence for a calvarial fracture.  CT MAXILLOFACIAL FINDINGS  Soft tissue swelling involving the right cheek, right periorbital region and right forehead. Globes are intact. Mandible is intact. The mandibular condyles are located. Mild mucosal thickening in the sphenoid sinuses. No evidence for an acute facial bone fracture. Specifically, the pterygoid plates are intact. Degenerative facet disease in the cervical spine. Incidentally, there is periapical lucency involving the right lower second molar. The lucency or caries involving the right lower canine tooth.  CT CERVICAL SPINE FINDINGS  Limited evaluation of the lung apices. Negative for fracture or dislocation in cervical spine. No soft tissue swelling in the neck. Bilateral facet disease in the cervical spine. Bone detail in the lower neck at the  cervicothoracic junction is limited. Overall alignment of cervical spine is normal. There is disc space narrowing at C6-C7 with bridging anterior osteophytes.  IMPRESSION: No acute intracranial abnormality. Mild atrophy and evidence for chronic small vessel ischemic changes.  Extensive soft tissue swelling on the right side of the face. No underlying fracture.  Multilevel degenerative changes in the cervical spine without acute bone abnormality.   Electronically Signed   By: Markus Daft M.D.   On: 09/11/2014 09:24     Microbiology: Recent Results (from the past 240 hour(s))  MRSA PCR Screening     Status: Abnormal   Collection Time: 09/11/14  7:09 PM  Result Value Ref Range Status   MRSA by PCR POSITIVE (A) NEGATIVE Final    Comment:        The GeneXpert MRSA Assay (FDA approved for NASAL specimens only), is one component of a comprehensive MRSA colonization surveillance program. It is not intended to diagnose MRSA infection nor to guide or monitor treatment for MRSA infections. RESULT CALLED TO, READ BACK BY AND VERIFIED WITH: D HART RN 2115 09/11/14 A BROWNING   Urine culture     Status: None   Collection Time: 09/13/14  6:47 AM  Result Value Ref Range Status   Specimen Description URINE, CATHETERIZED  Final   Special Requests NONE  Final   Culture MULTIPLE SPECIES PRESENT, SUGGEST RECOLLECTION  Final   Report Status 09/14/2014 FINAL  Final  Culture, blood (routine x 2)     Status: None (Preliminary result)   Collection Time: 09/13/14  7:31 PM  Result Value Ref Range Status   Specimen Description BLOOD RIGHT HAND  Final   Special Requests IN PEDIATRIC BOTTLE 2CC  Final   Culture NO GROWTH < 24 HOURS  Final   Report Status PENDING  Incomplete  Culture, blood (routine x 2)     Status: None (Preliminary result)   Collection Time: 09/13/14  8:22 PM  Result Value Ref Range Status   Specimen Description BLOOD LEFT HAND  Final   Special Requests IN PEDIATRIC BOTTLE 3.5CC  Final    Culture NO GROWTH < 24 HOURS  Final   Report Status PENDING  Incomplete  Labs: Basic Metabolic Panel:  Recent Labs Lab 09/11/14 1909 09/12/14 0125 09/12/14 1421 09/13/14 0936 09/14/14 0600  NA 135 133* 135 137 135  K 6.2* 5.7* 5.6* 4.7 4.6  CL 100* 99* 102 102 100*  CO2 26 23 21* 23 23  GLUCOSE 178* 251* 237* 173* 185*  BUN 79* 84* 89* 88* 86*  CREATININE 2.01* 2.00* 2.42* 3.00* 3.48*  CALCIUM 8.7* 8.5* 8.5* 8.2* 7.8*   Liver Function Tests: No results for input(s): AST, ALT, ALKPHOS, BILITOT, PROT, ALBUMIN in the last 168 hours. No results for input(s): LIPASE, AMYLASE in the last 168 hours. No results for input(s): AMMONIA in the last 168 hours. CBC:  Recent Labs Lab 09/11/14 0825 09/12/14 0125 09/13/14 0936 09/14/14 0600  WBC 12.8* 11.3* 10.4 15.5*  NEUTROABS 10.2*  --   --   --   HGB 9.6* 9.7* 9.7* 8.9*  HCT 32.0* 32.8* 32.8* 30.3*  MCV 88.9 90.4 92.9 92.4  PLT 296 302 176 209   Cardiac Enzymes:  Recent Labs Lab 09/11/14 1501 09/11/14 1909 09/12/14 0125  TROPONINI 0.03 0.03 0.03   BNP: Invalid input(s): POCBNP CBG:  Recent Labs Lab 09/13/14 1136 09/13/14 1647 09/13/14 2206 09/14/14 0721 09/14/14 1157  GLUCAP 153* 149* 132* 170* 158*    Time coordinating discharge:  Greater than 30 minutes  Signed:  Bethanne Mule, DO [facility] Pager: [phone] 09/15/2014, 10:53 AM

## 2014-09-15 NOTE — Progress Notes (Signed)
Stopped by for social call with family. Will sign off now.

## 2014-09-15 NOTE — Progress Notes (Signed)
Nutrition Brief Note  Chart reviewed. Pt now transitioning to comfort care.  No further nutrition interventions warranted at this time.  Please re-consult as needed.   Ashiyah Pavlak A. Megahn Killings, RD, LDN, CDE Pager: [phone] After hours Pager: [phone]  

## 2014-09-15 NOTE — Progress Notes (Signed)
Patient discharged to [facility] via [place] with family at bedside, report was given to nurse Biagio Borg.

## 2014-09-15 NOTE — Progress Notes (Signed)
Daily Progress Note   Patient Name: [name] [name]       Date: 09/15/2014 DOB: 1930/03/24  Age: 81 y.o. MRN#: [id number] Attending Physician: Orson Eva, MD Primary Care Physician: Cari Caraway, MD Admit Date: 09/11/2014  Reason for Consultation/Follow-up: Establishing goals of care  Subjective: [name] [name] is an 81 year old female with multiple chronic medical problems including diabetes, peripheral neuropathy, morbid obesity, recent hospitalization and cardioversion for atrial fibrillation with RVR, COPD, chronic back pain who was admitted after rolling out of bed and sustaining a trauma to her right eye. She was found to be in atrial fibrillation with RVR.She is evaluated by cardiology, and placed on heparin and amiodarone drip.Her hospital course was then further complicated by development of oliguric acute on chronic renal failure. Nephrology was consulted and had long discussion regarding goals with patient and family. Further discussion revealed the family would like to pursue placement with hospice agency for focus on comfort care. Palliative was consulted to facilitate discussion on goals of care as well as to provide a review of options for hospice services.   Interval Events: Patient has been accepted by [facility]. Plan for transport to [facility] later today. Symptomatically she seems well controlled this time and is resting comfortably. She has received 2 doses of Dilaudid.  Length of Stay: 4 days  Current Medications: Scheduled Meds:  . erythromycin   Right Eye TID  . mupirocin ointment  1 application Nasal BID  . nystatin cream   Topical BID  . sodium chloride  3 mL Intravenous Q12H  . sodium chloride  3 mL Intravenous Q12H    Continuous Infusions:    PRN Meds: sodium chloride, albuterol, glycopyrrolate **OR** glycopyrrolate **OR** glycopyrrolate, haloperidol **OR** haloperidol **OR** haloperidol lactate, HYDROmorphone (DILAUDID) injection, ipratropium-albuterol,  LORazepam **OR** [DISCONTINUED] LORazepam **OR** LORazepam, ondansetron **OR** ondansetron (ZOFRAN) IV, sodium chloride  Palliative Performance Scale: 10%     Vital Signs: BP 89/31 mmHg  Pulse 89  Temp(Src) 97.7 F (36.5 C) (Oral)  Resp 14  Ht 5\' 3"  (1.6 m)  Wt 113.3 kg (249 lb 12.5 oz)  BMI 44.26 kg/m2  SpO2 98% SpO2: SpO2: 98 % O2 Device: O2 Device: Nasal Cannula O2 Flow Rate: O2 Flow Rate (L/min): 2.5 L/min  Intake/output summary:  Intake/Output Summary (Last 24 hours) at 09/15/14 1301 Last data filed at 09/15/14 0800  Gross per 24 hour  Intake      0 ml  Output     25 ml  Net    -25 ml   LBM:   Baseline Weight: Weight: 99.791 kg (220 lb) Most recent weight: Weight: 113.3 kg (249 lb 12.5 oz) (bedscale)  Physical Exam: General: Obese elderly Caucasian female, opens eyes briefly and mumbles a few words. No acute distress. Resting comfortably. HEENT: Bruising overlying right eye, obese neck Cardiac: Irregular, tachycardic, no rubs, murmurs or gallops Pulm: Diminished, but clear to auscultation bilaterally with anterior auscultation only Abd: Obese abdomen, soft, nontender, nondistended, BS present Ext: Cool to touch in all 4 extremities, 2+ pitting edema right greater than left Neuro: More sleepy today but does open eyes to verbal and tactile stimulation. Moves 4 extremities    Additional Data Reviewed: Recent Labs     09/13/14  0936  09/14/14  0600  WBC  10.4  15.5*  HGB  9.7*  8.9*  PLT  176  209  NA  137  135  BUN  88*  86*  CREATININE  3.00*  3.48*  Problem List:  Patient Active Problem List   Diagnosis Date Noted  . Acute renal failure superimposed on stage 3 chronic kidney disease 09/13/2014  . Acute on chronic diastolic CHF (congestive heart failure), NYHA class 2 09/12/2014  . AKI (acute kidney injury) 09/11/2014  . Subconjunctival hemorrhage of right eye 09/11/2014  . Chronic diastolic CHF (congestive heart failure)   . Back pain   .  Fall   . Hyperkalemia   . Panniculitis   . Scleral laceration of right eye   . Subconjunctival hemorrhage   . Acute renal failure superimposed on stage 4 chronic kidney disease   . Atrial fibrillation with RVR 08/18/2014  . Acute encephalopathy 08/18/2014  . Severe obesity (BMI >= 40) 07/01/2014  . Chronic low back pain 05/25/2014  . COPD GOLD II  05/13/2014  . Chronic respiratory failure with hypercapnia 05/12/2014  . Acute bronchitis 04/17/2014  . Abnormal CT scan, chest 03/04/2014  . Physical deconditioning 02/04/2014  . Essential hypertension 02/01/2014  . Leukocytosis 02/01/2014  . Renal insufficiency 02/01/2014  . Shortness of breath   . Non-cardiac chest pain - normal LHC w/ nl EF 12/2013 01/03/2014  . Mild aortic stenosis   . Type II diabetes mellitus   . Cough 01/25/2013  . Lumbosacral spondylosis without myelopathy 05/07/2012  . Disorders of sacrum 05/07/2012  . Degeneration of lumbar or lumbosacral intervertebral disc 05/07/2012  . Diabetic peripheral neuropathy associated with type 2 diabetes mellitus 05/07/2012  . PAC (premature atrial contraction) 01/21/2012  . Chronic or unspecified duodenal ulcer with hemorrhage, without mention of obstruction 11/12/2010  . Pure hypercholesterolemia 11/12/2010  . Benign hypertensive heart disease without heart failure 11/12/2010  . Mild to Moderate Ao Stenosis 11/12/2010     Palliative Care Assessment & Plan    Code Status:  DNR  Goals of Care: Patient to [facility] today   Symptom Management:  Pain appears to be her only issue. It is well controlled on current regimen of infrequent Dilaudid. Will continue same.  Palliative Prophylaxis:  Dulcolax as needed  Psycho-social/Spiritual:  Desire for further Chaplaincy support:no   Prognosis: Hours - Days Discharge Planning: Hospice facility   Care plan was discussed with patient, her family, and [name]  Thank you for allowing the Palliative Medicine Team  to assist in the care of this patient.   Time In: 1120 Time Out: 1140 Total Time 20 Prolonged Time Billed no    Greater than 50%  of this time was spent counseling and coordinating care related to the above assessment and plan.   Micheline Rough, MD  09/15/2014, 1:01 PM  Please contact Palliative Medicine Team phone at [phone] for questions and concerns.

## 2014-09-18 LAB — CULTURE, BLOOD (ROUTINE X 2)
CULTURE: NO GROWTH
CULTURE: NO GROWTH

## 2014-09-19 ENCOUNTER — Encounter: Payer: Self-pay | Admitting: Cardiology

## 2014-09-28 ENCOUNTER — Ambulatory Visit (HOSPITAL_COMMUNITY): Payer: Medicare HMO

## 2014-10-14 DEATH — deceased

## 2014-11-23 ENCOUNTER — Ambulatory Visit: Payer: Medicare HMO | Admitting: Physical Medicine & Rehabilitation

## 2016-12-17 IMAGING — CR DG CHEST 1V PORT
1 series · 1 of 1 positions shown · non-contrast
Comparison: 01/09/2014

CLINICAL DATA: Severe respiratory distress and low oxygen
saturations.

EXAM:
PORTABLE CHEST - 1 VIEW

[AP]
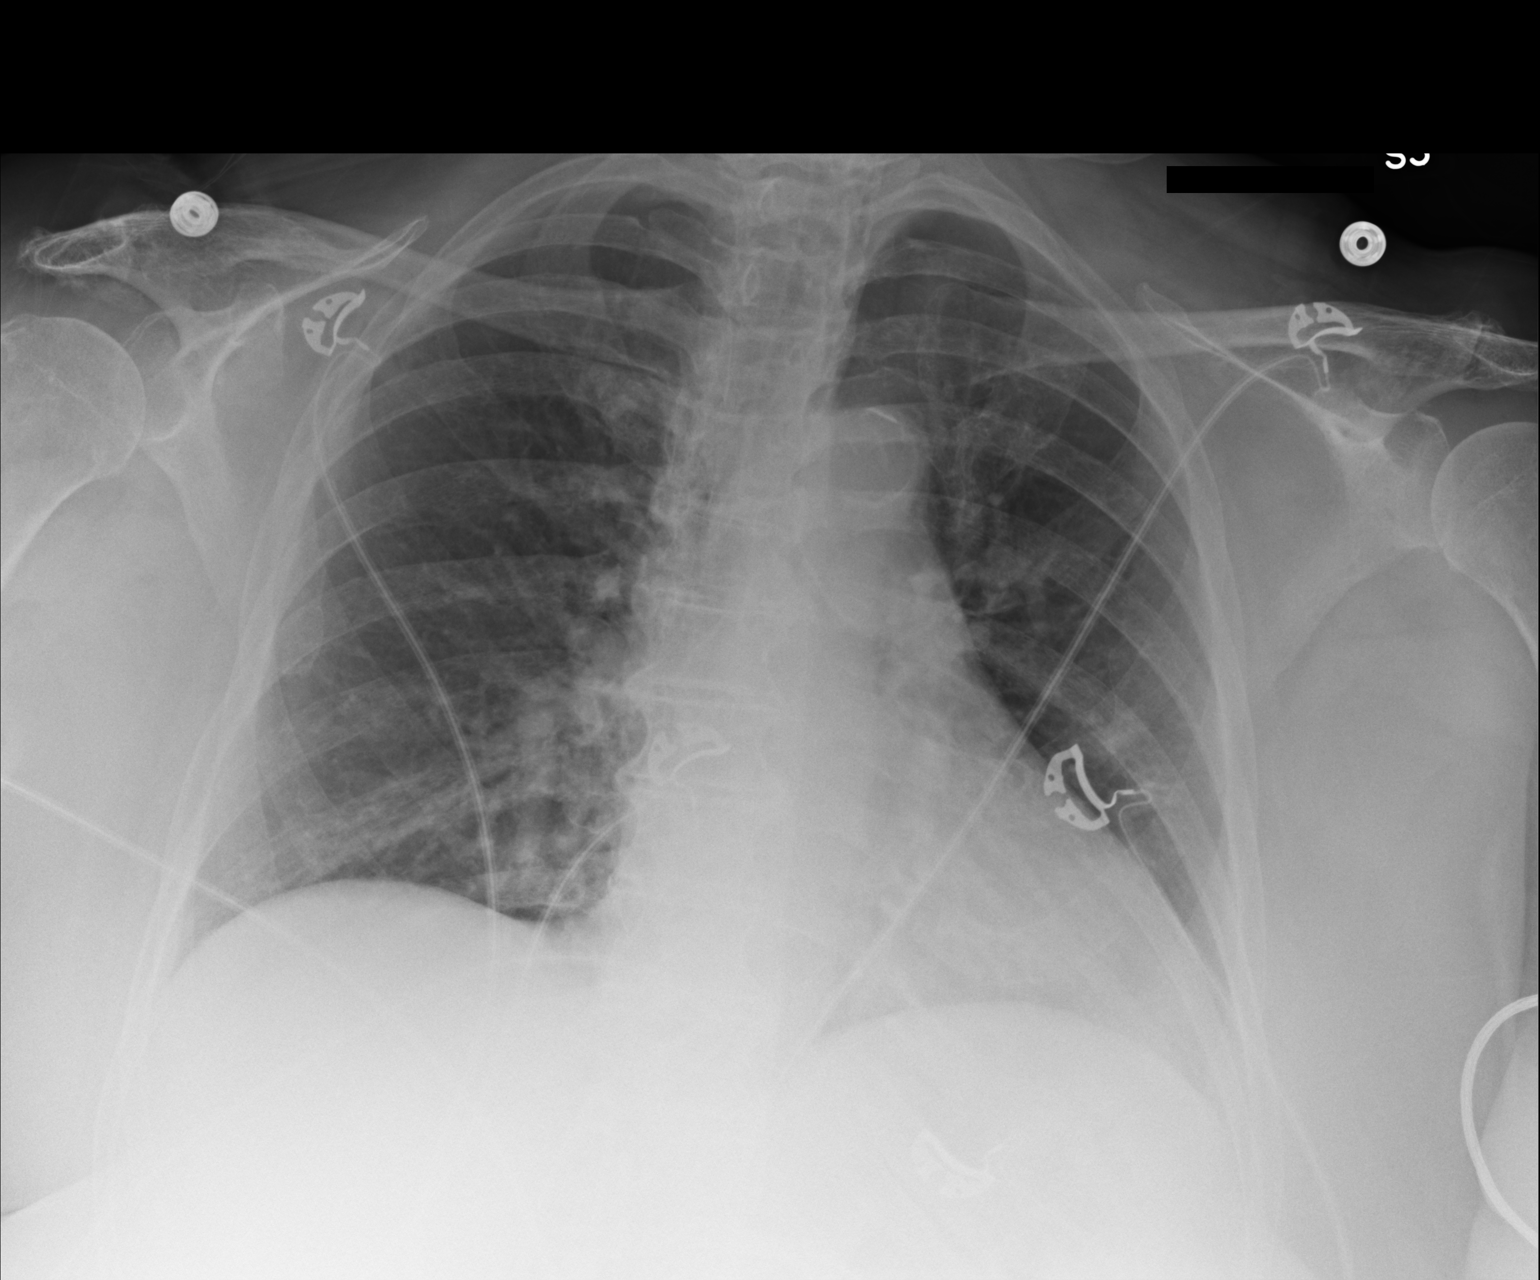

[1 of 1 positions shown; findings below may reference images not displayed]

FINDINGS: Single view of the chest demonstrates clear lungs. There is no
significant pulmonary edema or airspace disease. Heart size is
within normal limits and stable. Again noted are atherosclerotic
calcifications at the aortic arch. Patient is mildly rotated towards
the left.
IMPRESSION: No acute chest findings.

## 2017-02-13 IMAGING — CR DG CHEST 2V
2 series · 2 of 2 positions shown · non-contrast
Comparison: CTA chest 02/01/2014 and earlier.

CLINICAL DATA: 84-year-old female with shortness of breath on
exertion. Query pneumonia. Initial encounter.

EXAM:
CHEST  2 VIEW

[view not recorded (1 of 2)]
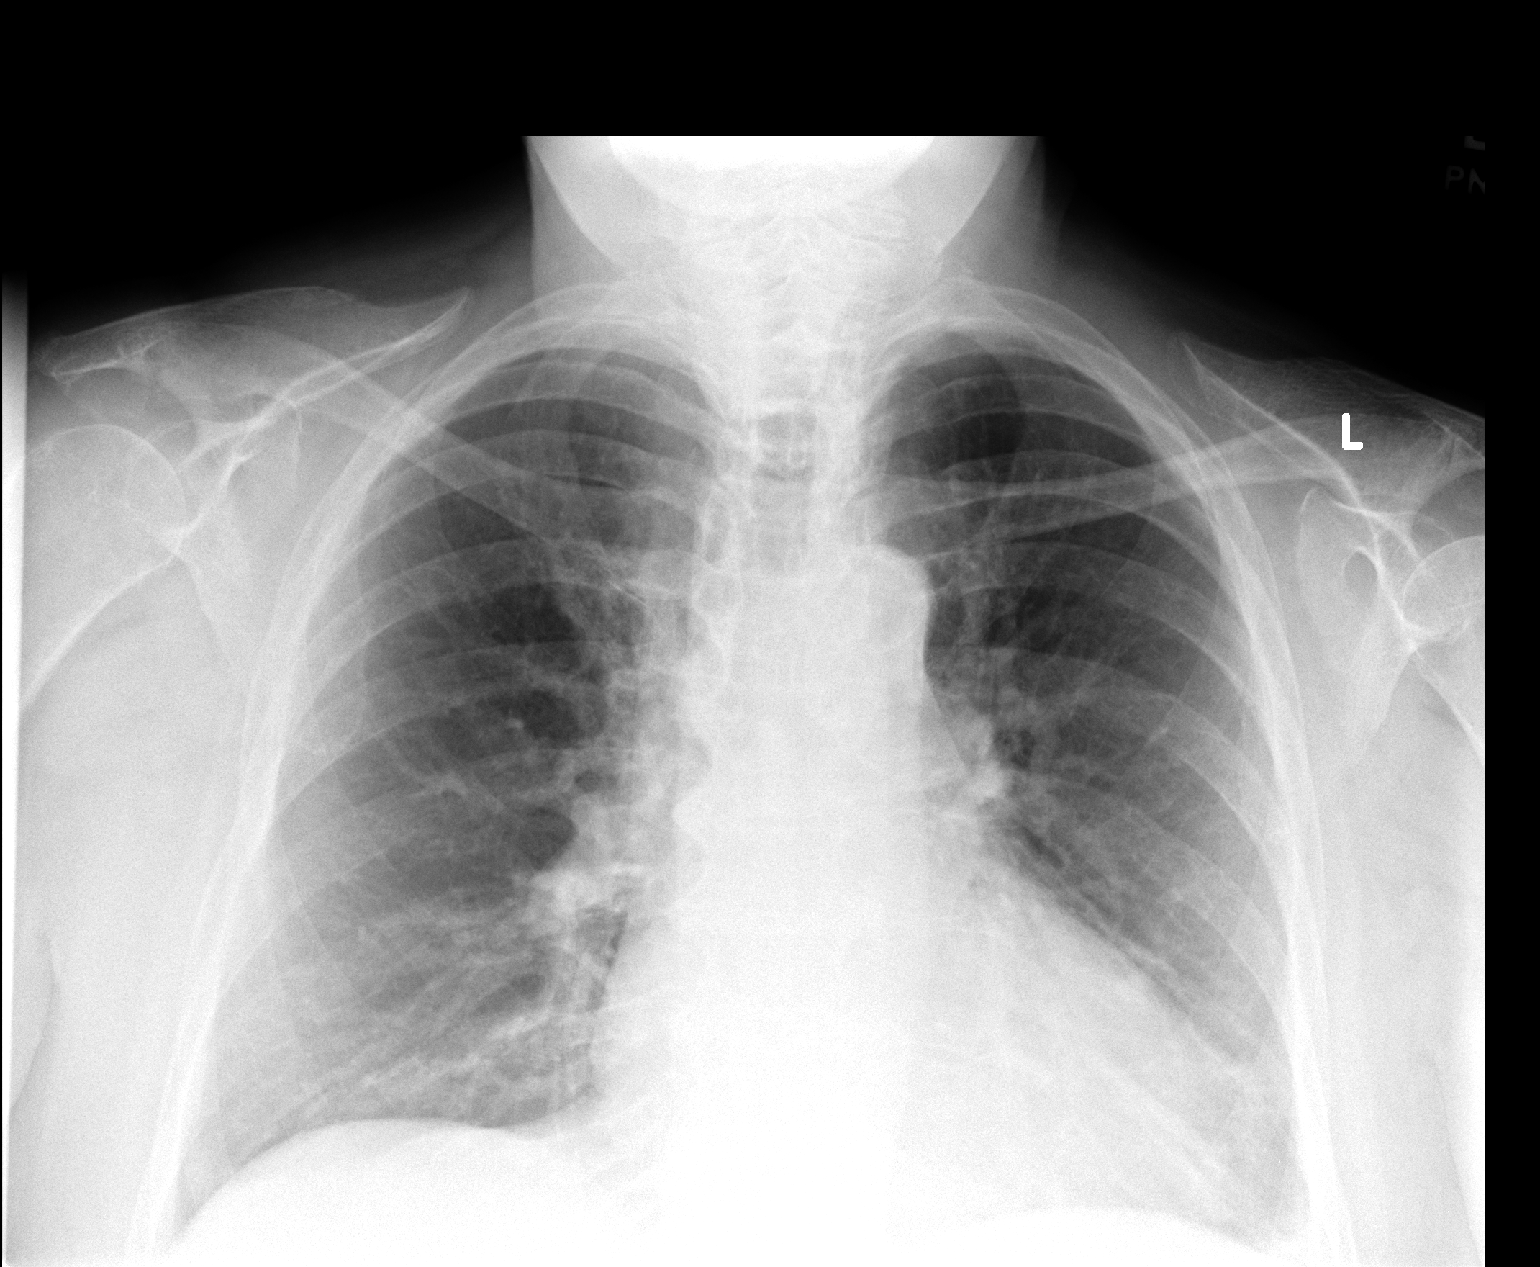

[view not recorded (2 of 2)]
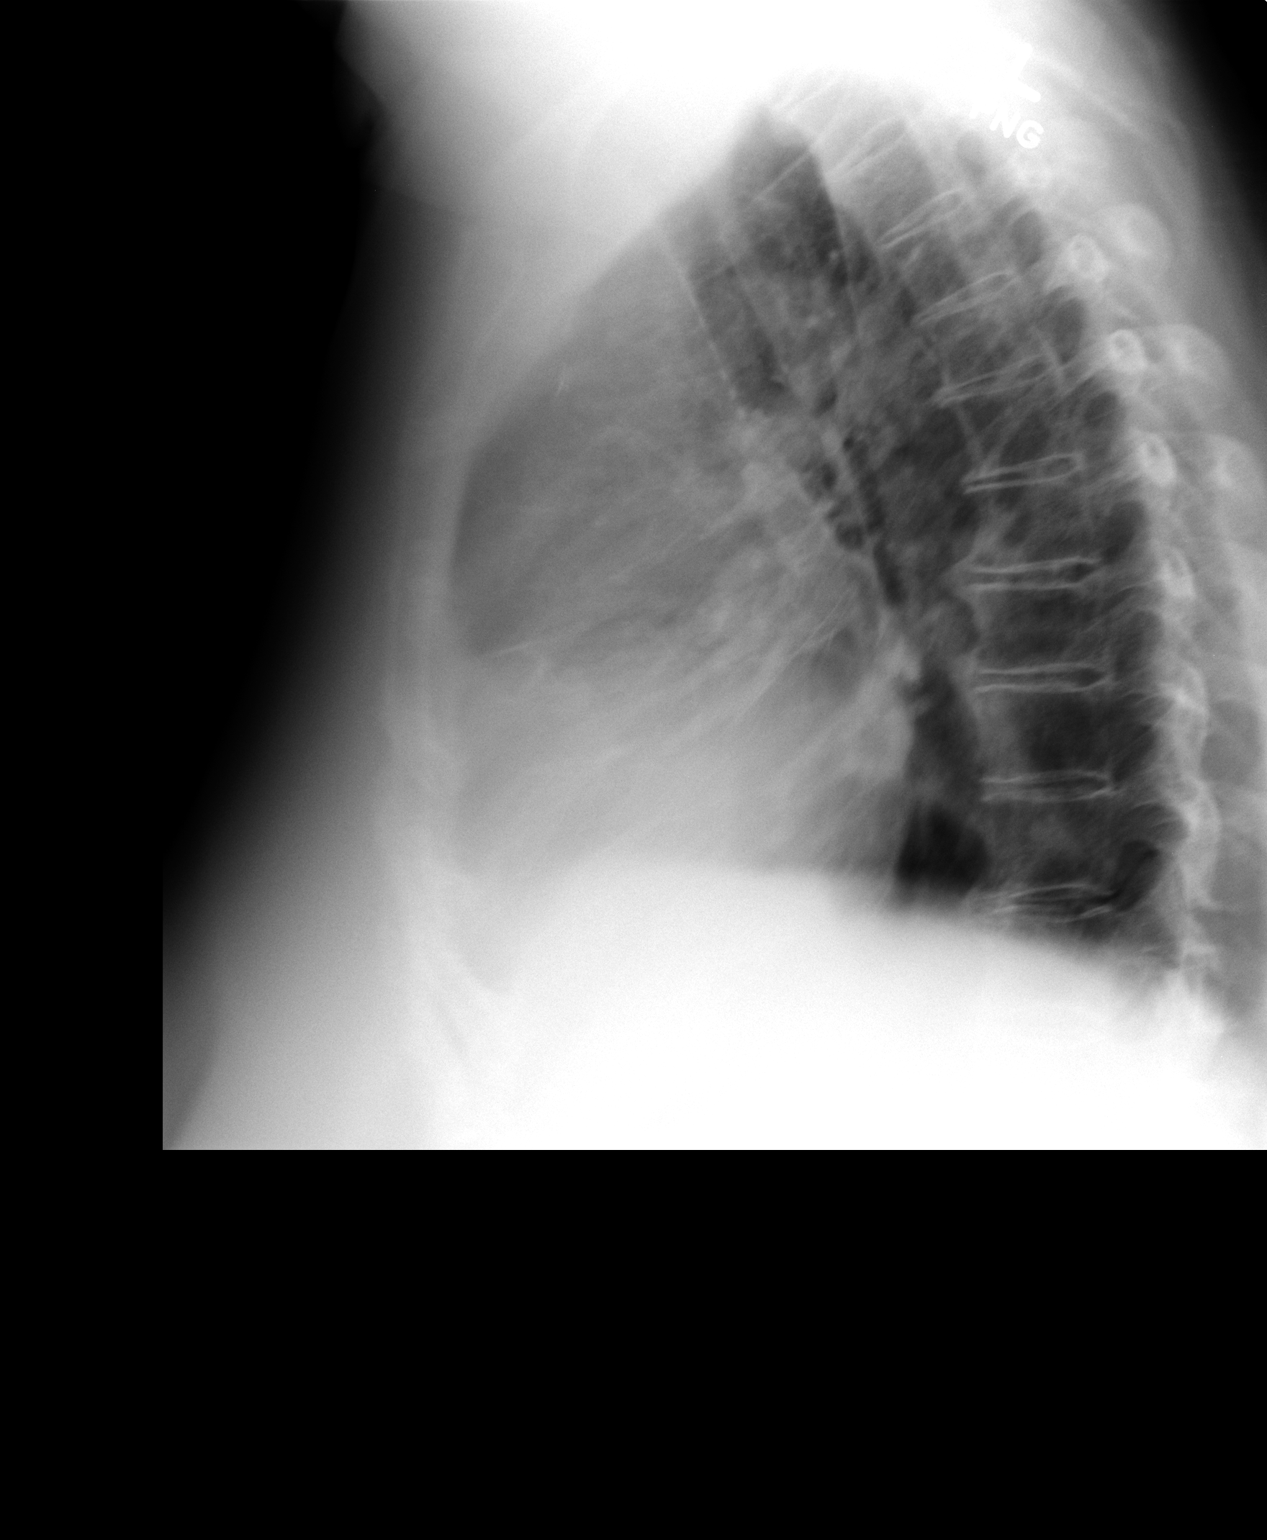

[2 of 2 positions shown; findings below may reference images not displayed]

FINDINGS: Mild motion artifact on the lateral view. Stable lung volumes.
Stable cardiac size and mediastinal contours. No pneumothorax or
pulmonary edema. No pleural effusion or consolidation. No acute
pulmonary opacity identified. No acute osseous abnormality
identified.
IMPRESSION: No acute cardiopulmonary abnormality.
